# Patient Record
Sex: Female | Born: 1935
Health system: Southern US, Community
[De-identification: ages and names within clinical notes are randomized; demographics above are authoritative.]

## PROBLEM LIST (undated history)

## (undated) DIAGNOSIS — L0390 Cellulitis, unspecified: Secondary | ICD-10-CM

## (undated) HISTORY — PX: GANGLION CYST EXCISION: SHX1691

## (undated) HISTORY — PX: ABDOMINAL HYSTERECTOMY: SHX81

---

## 2004-11-03 ENCOUNTER — Ambulatory Visit: Payer: Self-pay

## 2004-11-10 ENCOUNTER — Ambulatory Visit: Payer: Self-pay

## 2010-11-19 ENCOUNTER — Inpatient Hospital Stay: Payer: Self-pay | Admitting: Internal Medicine

## 2010-11-24 ENCOUNTER — Encounter: Payer: Self-pay | Admitting: Internal Medicine

## 2010-12-08 ENCOUNTER — Encounter: Payer: Self-pay | Admitting: Nurse Practitioner

## 2010-12-08 ENCOUNTER — Encounter: Payer: Self-pay | Admitting: Cardiothoracic Surgery

## 2010-12-11 ENCOUNTER — Encounter: Payer: Self-pay | Admitting: Internal Medicine

## 2010-12-11 ENCOUNTER — Encounter: Payer: Self-pay | Admitting: Nurse Practitioner

## 2010-12-11 ENCOUNTER — Encounter: Payer: Self-pay | Admitting: Cardiothoracic Surgery

## 2014-04-17 DIAGNOSIS — D508 Other iron deficiency anemias: Secondary | ICD-10-CM | POA: Diagnosis not present

## 2014-04-17 DIAGNOSIS — I10 Essential (primary) hypertension: Secondary | ICD-10-CM | POA: Diagnosis not present

## 2014-04-17 DIAGNOSIS — E559 Vitamin D deficiency, unspecified: Secondary | ICD-10-CM | POA: Diagnosis not present

## 2014-10-30 DIAGNOSIS — Z0000 Encounter for general adult medical examination without abnormal findings: Secondary | ICD-10-CM | POA: Diagnosis not present

## 2014-11-04 DIAGNOSIS — Z0000 Encounter for general adult medical examination without abnormal findings: Secondary | ICD-10-CM | POA: Diagnosis not present

## 2015-04-30 DIAGNOSIS — I10 Essential (primary) hypertension: Secondary | ICD-10-CM | POA: Diagnosis not present

## 2015-10-23 DIAGNOSIS — I878 Other specified disorders of veins: Secondary | ICD-10-CM | POA: Diagnosis not present

## 2015-10-23 DIAGNOSIS — D696 Thrombocytopenia, unspecified: Secondary | ICD-10-CM | POA: Diagnosis not present

## 2015-10-23 DIAGNOSIS — R5383 Other fatigue: Secondary | ICD-10-CM | POA: Diagnosis not present

## 2015-10-23 DIAGNOSIS — L539 Erythematous condition, unspecified: Secondary | ICD-10-CM | POA: Diagnosis not present

## 2015-10-23 DIAGNOSIS — R350 Frequency of micturition: Secondary | ICD-10-CM | POA: Diagnosis not present

## 2015-10-23 DIAGNOSIS — R3915 Urgency of urination: Secondary | ICD-10-CM | POA: Diagnosis not present

## 2015-10-23 DIAGNOSIS — L03116 Cellulitis of left lower limb: Secondary | ICD-10-CM | POA: Diagnosis not present

## 2015-10-23 DIAGNOSIS — I10 Essential (primary) hypertension: Secondary | ICD-10-CM | POA: Diagnosis not present

## 2015-10-23 DIAGNOSIS — N390 Urinary tract infection, site not specified: Secondary | ICD-10-CM | POA: Diagnosis not present

## 2015-10-23 DIAGNOSIS — R600 Localized edema: Secondary | ICD-10-CM | POA: Diagnosis not present

## 2015-10-30 DIAGNOSIS — I10 Essential (primary) hypertension: Secondary | ICD-10-CM | POA: Diagnosis not present

## 2015-10-30 DIAGNOSIS — L03116 Cellulitis of left lower limb: Secondary | ICD-10-CM | POA: Diagnosis not present

## 2015-10-30 DIAGNOSIS — I878 Other specified disorders of veins: Secondary | ICD-10-CM | POA: Diagnosis not present

## 2015-10-30 DIAGNOSIS — H259 Unspecified age-related cataract: Secondary | ICD-10-CM | POA: Diagnosis not present

## 2015-10-30 DIAGNOSIS — M1991 Primary osteoarthritis, unspecified site: Secondary | ICD-10-CM | POA: Diagnosis not present

## 2015-10-31 DIAGNOSIS — M1991 Primary osteoarthritis, unspecified site: Secondary | ICD-10-CM | POA: Diagnosis not present

## 2015-10-31 DIAGNOSIS — L03116 Cellulitis of left lower limb: Secondary | ICD-10-CM | POA: Diagnosis not present

## 2015-10-31 DIAGNOSIS — H259 Unspecified age-related cataract: Secondary | ICD-10-CM | POA: Diagnosis not present

## 2015-10-31 DIAGNOSIS — I878 Other specified disorders of veins: Secondary | ICD-10-CM | POA: Diagnosis not present

## 2015-10-31 DIAGNOSIS — I10 Essential (primary) hypertension: Secondary | ICD-10-CM | POA: Diagnosis not present

## 2015-11-02 DIAGNOSIS — L03116 Cellulitis of left lower limb: Secondary | ICD-10-CM | POA: Diagnosis not present

## 2015-11-02 DIAGNOSIS — I878 Other specified disorders of veins: Secondary | ICD-10-CM | POA: Diagnosis not present

## 2015-11-02 DIAGNOSIS — I10 Essential (primary) hypertension: Secondary | ICD-10-CM | POA: Diagnosis not present

## 2015-11-03 DIAGNOSIS — L03116 Cellulitis of left lower limb: Secondary | ICD-10-CM | POA: Diagnosis not present

## 2015-11-03 DIAGNOSIS — H259 Unspecified age-related cataract: Secondary | ICD-10-CM | POA: Diagnosis not present

## 2015-11-03 DIAGNOSIS — M1991 Primary osteoarthritis, unspecified site: Secondary | ICD-10-CM | POA: Diagnosis not present

## 2015-11-03 DIAGNOSIS — I878 Other specified disorders of veins: Secondary | ICD-10-CM | POA: Diagnosis not present

## 2015-11-03 DIAGNOSIS — I10 Essential (primary) hypertension: Secondary | ICD-10-CM | POA: Diagnosis not present

## 2015-11-04 DIAGNOSIS — H259 Unspecified age-related cataract: Secondary | ICD-10-CM | POA: Diagnosis not present

## 2015-11-04 DIAGNOSIS — I10 Essential (primary) hypertension: Secondary | ICD-10-CM | POA: Diagnosis not present

## 2015-11-04 DIAGNOSIS — L03116 Cellulitis of left lower limb: Secondary | ICD-10-CM | POA: Diagnosis not present

## 2015-11-04 DIAGNOSIS — M1991 Primary osteoarthritis, unspecified site: Secondary | ICD-10-CM | POA: Diagnosis not present

## 2015-11-04 DIAGNOSIS — I878 Other specified disorders of veins: Secondary | ICD-10-CM | POA: Diagnosis not present

## 2015-11-06 DIAGNOSIS — I10 Essential (primary) hypertension: Secondary | ICD-10-CM | POA: Diagnosis not present

## 2015-11-06 DIAGNOSIS — M1991 Primary osteoarthritis, unspecified site: Secondary | ICD-10-CM | POA: Diagnosis not present

## 2015-11-06 DIAGNOSIS — I878 Other specified disorders of veins: Secondary | ICD-10-CM | POA: Diagnosis not present

## 2015-11-06 DIAGNOSIS — H259 Unspecified age-related cataract: Secondary | ICD-10-CM | POA: Diagnosis not present

## 2015-11-06 DIAGNOSIS — L03116 Cellulitis of left lower limb: Secondary | ICD-10-CM | POA: Diagnosis not present

## 2015-11-09 DIAGNOSIS — I10 Essential (primary) hypertension: Secondary | ICD-10-CM | POA: Diagnosis not present

## 2015-11-09 DIAGNOSIS — H259 Unspecified age-related cataract: Secondary | ICD-10-CM | POA: Diagnosis not present

## 2015-11-09 DIAGNOSIS — L03116 Cellulitis of left lower limb: Secondary | ICD-10-CM | POA: Diagnosis not present

## 2015-11-09 DIAGNOSIS — I878 Other specified disorders of veins: Secondary | ICD-10-CM | POA: Diagnosis not present

## 2015-11-09 DIAGNOSIS — M1991 Primary osteoarthritis, unspecified site: Secondary | ICD-10-CM | POA: Diagnosis not present

## 2015-11-11 DIAGNOSIS — H259 Unspecified age-related cataract: Secondary | ICD-10-CM | POA: Diagnosis not present

## 2015-11-11 DIAGNOSIS — L03116 Cellulitis of left lower limb: Secondary | ICD-10-CM | POA: Diagnosis not present

## 2015-11-11 DIAGNOSIS — M1991 Primary osteoarthritis, unspecified site: Secondary | ICD-10-CM | POA: Diagnosis not present

## 2015-11-11 DIAGNOSIS — I10 Essential (primary) hypertension: Secondary | ICD-10-CM | POA: Diagnosis not present

## 2015-11-11 DIAGNOSIS — E669 Obesity, unspecified: Secondary | ICD-10-CM | POA: Diagnosis not present

## 2015-11-11 DIAGNOSIS — I878 Other specified disorders of veins: Secondary | ICD-10-CM | POA: Diagnosis not present

## 2015-11-11 DIAGNOSIS — Z6841 Body Mass Index (BMI) 40.0 and over, adult: Secondary | ICD-10-CM | POA: Diagnosis not present

## 2015-11-13 DIAGNOSIS — H259 Unspecified age-related cataract: Secondary | ICD-10-CM | POA: Diagnosis not present

## 2015-11-13 DIAGNOSIS — I10 Essential (primary) hypertension: Secondary | ICD-10-CM | POA: Diagnosis not present

## 2015-11-13 DIAGNOSIS — M1991 Primary osteoarthritis, unspecified site: Secondary | ICD-10-CM | POA: Diagnosis not present

## 2015-11-13 DIAGNOSIS — I878 Other specified disorders of veins: Secondary | ICD-10-CM | POA: Diagnosis not present

## 2015-11-13 DIAGNOSIS — L03116 Cellulitis of left lower limb: Secondary | ICD-10-CM | POA: Diagnosis not present

## 2015-11-16 DIAGNOSIS — L03116 Cellulitis of left lower limb: Secondary | ICD-10-CM | POA: Diagnosis not present

## 2015-11-16 DIAGNOSIS — I878 Other specified disorders of veins: Secondary | ICD-10-CM | POA: Diagnosis not present

## 2015-11-16 DIAGNOSIS — H259 Unspecified age-related cataract: Secondary | ICD-10-CM | POA: Diagnosis not present

## 2015-11-16 DIAGNOSIS — M1991 Primary osteoarthritis, unspecified site: Secondary | ICD-10-CM | POA: Diagnosis not present

## 2015-11-16 DIAGNOSIS — I10 Essential (primary) hypertension: Secondary | ICD-10-CM | POA: Diagnosis not present

## 2015-11-17 DIAGNOSIS — I10 Essential (primary) hypertension: Secondary | ICD-10-CM | POA: Diagnosis not present

## 2015-11-17 DIAGNOSIS — L608 Other nail disorders: Secondary | ICD-10-CM | POA: Diagnosis not present

## 2015-11-17 DIAGNOSIS — L03116 Cellulitis of left lower limb: Secondary | ICD-10-CM | POA: Diagnosis not present

## 2015-11-17 DIAGNOSIS — M1991 Primary osteoarthritis, unspecified site: Secondary | ICD-10-CM | POA: Diagnosis not present

## 2015-11-17 DIAGNOSIS — H259 Unspecified age-related cataract: Secondary | ICD-10-CM | POA: Diagnosis not present

## 2015-11-17 DIAGNOSIS — I878 Other specified disorders of veins: Secondary | ICD-10-CM | POA: Diagnosis not present

## 2015-11-19 DIAGNOSIS — I10 Essential (primary) hypertension: Secondary | ICD-10-CM | POA: Diagnosis not present

## 2015-11-19 DIAGNOSIS — I878 Other specified disorders of veins: Secondary | ICD-10-CM | POA: Diagnosis not present

## 2015-11-19 DIAGNOSIS — L03116 Cellulitis of left lower limb: Secondary | ICD-10-CM | POA: Diagnosis not present

## 2015-11-19 DIAGNOSIS — H259 Unspecified age-related cataract: Secondary | ICD-10-CM | POA: Diagnosis not present

## 2015-11-19 DIAGNOSIS — M1991 Primary osteoarthritis, unspecified site: Secondary | ICD-10-CM | POA: Diagnosis not present

## 2015-11-23 DIAGNOSIS — L03116 Cellulitis of left lower limb: Secondary | ICD-10-CM | POA: Diagnosis not present

## 2015-11-23 DIAGNOSIS — M1991 Primary osteoarthritis, unspecified site: Secondary | ICD-10-CM | POA: Diagnosis not present

## 2015-11-23 DIAGNOSIS — H259 Unspecified age-related cataract: Secondary | ICD-10-CM | POA: Diagnosis not present

## 2015-11-23 DIAGNOSIS — I10 Essential (primary) hypertension: Secondary | ICD-10-CM | POA: Diagnosis not present

## 2015-11-23 DIAGNOSIS — I878 Other specified disorders of veins: Secondary | ICD-10-CM | POA: Diagnosis not present

## 2015-11-25 DIAGNOSIS — I10 Essential (primary) hypertension: Secondary | ICD-10-CM | POA: Diagnosis not present

## 2015-11-25 DIAGNOSIS — R609 Edema, unspecified: Secondary | ICD-10-CM | POA: Diagnosis not present

## 2015-11-25 DIAGNOSIS — L03119 Cellulitis of unspecified part of limb: Secondary | ICD-10-CM | POA: Diagnosis not present

## 2015-11-25 DIAGNOSIS — Z7689 Persons encountering health services in other specified circumstances: Secondary | ICD-10-CM | POA: Diagnosis not present

## 2015-11-30 DIAGNOSIS — H259 Unspecified age-related cataract: Secondary | ICD-10-CM | POA: Diagnosis not present

## 2015-11-30 DIAGNOSIS — I878 Other specified disorders of veins: Secondary | ICD-10-CM | POA: Diagnosis not present

## 2015-11-30 DIAGNOSIS — M1991 Primary osteoarthritis, unspecified site: Secondary | ICD-10-CM | POA: Diagnosis not present

## 2015-11-30 DIAGNOSIS — L03116 Cellulitis of left lower limb: Secondary | ICD-10-CM | POA: Diagnosis not present

## 2015-11-30 DIAGNOSIS — I10 Essential (primary) hypertension: Secondary | ICD-10-CM | POA: Diagnosis not present

## 2016-06-29 DIAGNOSIS — Z0000 Encounter for general adult medical examination without abnormal findings: Secondary | ICD-10-CM | POA: Diagnosis not present

## 2016-06-29 DIAGNOSIS — I10 Essential (primary) hypertension: Secondary | ICD-10-CM | POA: Diagnosis not present

## 2016-09-27 DIAGNOSIS — I890 Lymphedema, not elsewhere classified: Secondary | ICD-10-CM | POA: Diagnosis not present

## 2016-09-27 DIAGNOSIS — M7989 Other specified soft tissue disorders: Secondary | ICD-10-CM | POA: Diagnosis not present

## 2016-09-27 DIAGNOSIS — L03116 Cellulitis of left lower limb: Secondary | ICD-10-CM | POA: Diagnosis not present

## 2016-09-27 DIAGNOSIS — R600 Localized edema: Secondary | ICD-10-CM | POA: Diagnosis not present

## 2016-09-28 DIAGNOSIS — L03116 Cellulitis of left lower limb: Secondary | ICD-10-CM | POA: Diagnosis not present

## 2016-09-28 DIAGNOSIS — I878 Other specified disorders of veins: Secondary | ICD-10-CM | POA: Diagnosis not present

## 2016-09-28 DIAGNOSIS — I890 Lymphedema, not elsewhere classified: Secondary | ICD-10-CM | POA: Diagnosis not present

## 2016-10-05 DIAGNOSIS — I890 Lymphedema, not elsewhere classified: Secondary | ICD-10-CM | POA: Diagnosis not present

## 2016-10-05 DIAGNOSIS — L03116 Cellulitis of left lower limb: Secondary | ICD-10-CM | POA: Diagnosis not present

## 2016-10-05 DIAGNOSIS — L03115 Cellulitis of right lower limb: Secondary | ICD-10-CM | POA: Diagnosis not present

## 2016-10-05 DIAGNOSIS — L02419 Cutaneous abscess of limb, unspecified: Secondary | ICD-10-CM | POA: Diagnosis not present

## 2016-10-09 DIAGNOSIS — L309 Dermatitis, unspecified: Secondary | ICD-10-CM | POA: Diagnosis not present

## 2016-10-09 DIAGNOSIS — I878 Other specified disorders of veins: Secondary | ICD-10-CM | POA: Diagnosis not present

## 2016-10-09 DIAGNOSIS — I872 Venous insufficiency (chronic) (peripheral): Secondary | ICD-10-CM | POA: Diagnosis not present

## 2016-10-09 DIAGNOSIS — Z6835 Body mass index (BMI) 35.0-35.9, adult: Secondary | ICD-10-CM | POA: Diagnosis not present

## 2016-10-09 DIAGNOSIS — L03116 Cellulitis of left lower limb: Secondary | ICD-10-CM | POA: Diagnosis not present

## 2016-10-09 DIAGNOSIS — B353 Tinea pedis: Secondary | ICD-10-CM | POA: Diagnosis not present

## 2016-10-09 DIAGNOSIS — I10 Essential (primary) hypertension: Secondary | ICD-10-CM | POA: Diagnosis not present

## 2016-10-09 DIAGNOSIS — I890 Lymphedema, not elsewhere classified: Secondary | ICD-10-CM | POA: Diagnosis not present

## 2016-10-19 DIAGNOSIS — L02419 Cutaneous abscess of limb, unspecified: Secondary | ICD-10-CM | POA: Diagnosis not present

## 2016-10-19 DIAGNOSIS — I890 Lymphedema, not elsewhere classified: Secondary | ICD-10-CM | POA: Diagnosis not present

## 2016-10-19 DIAGNOSIS — I10 Essential (primary) hypertension: Secondary | ICD-10-CM | POA: Diagnosis not present

## 2016-10-19 DIAGNOSIS — L03119 Cellulitis of unspecified part of limb: Secondary | ICD-10-CM | POA: Diagnosis not present

## 2016-10-25 ENCOUNTER — Encounter (INDEPENDENT_AMBULATORY_CARE_PROVIDER_SITE_OTHER): Payer: Self-pay | Admitting: Vascular Surgery

## 2016-10-28 DIAGNOSIS — L0390 Cellulitis, unspecified: Secondary | ICD-10-CM

## 2016-10-28 HISTORY — DX: Cellulitis, unspecified: L03.90

## 2016-11-04 ENCOUNTER — Emergency Department: Payer: Medicare HMO

## 2016-11-04 ENCOUNTER — Inpatient Hospital Stay
Admission: EM | Admit: 2016-11-04 | Discharge: 2016-11-07 | DRG: 682 | Disposition: A | Discharge status: Skilled Nursing Facility | Payer: Medicare HMO | Attending: Internal Medicine | Admitting: Internal Medicine

## 2016-11-04 ENCOUNTER — Encounter: Payer: Self-pay | Admitting: *Deleted

## 2016-11-04 DIAGNOSIS — L03116 Cellulitis of left lower limb: Secondary | ICD-10-CM | POA: Diagnosis not present

## 2016-11-04 DIAGNOSIS — R740 Nonspecific elevation of levels of transaminase and lactic acid dehydrogenase [LDH]: Secondary | ICD-10-CM | POA: Diagnosis present

## 2016-11-04 DIAGNOSIS — L8993 Pressure ulcer of unspecified site, stage 3: Secondary | ICD-10-CM | POA: Diagnosis present

## 2016-11-04 DIAGNOSIS — N179 Acute kidney failure, unspecified: Secondary | ICD-10-CM | POA: Diagnosis not present

## 2016-11-04 DIAGNOSIS — I890 Lymphedema, not elsewhere classified: Secondary | ICD-10-CM | POA: Diagnosis not present

## 2016-11-04 DIAGNOSIS — R4182 Altered mental status, unspecified: Secondary | ICD-10-CM | POA: Diagnosis not present

## 2016-11-04 DIAGNOSIS — R748 Abnormal levels of other serum enzymes: Secondary | ICD-10-CM | POA: Diagnosis not present

## 2016-11-04 DIAGNOSIS — L03115 Cellulitis of right lower limb: Secondary | ICD-10-CM | POA: Diagnosis not present

## 2016-11-04 DIAGNOSIS — Z7982 Long term (current) use of aspirin: Secondary | ICD-10-CM

## 2016-11-04 DIAGNOSIS — L89322 Pressure ulcer of left buttock, stage 2: Secondary | ICD-10-CM | POA: Insufficient documentation

## 2016-11-04 DIAGNOSIS — Z8249 Family history of ischemic heart disease and other diseases of the circulatory system: Secondary | ICD-10-CM

## 2016-11-04 DIAGNOSIS — Z90710 Acquired absence of both cervix and uterus: Secondary | ICD-10-CM

## 2016-11-04 DIAGNOSIS — L89152 Pressure ulcer of sacral region, stage 2: Secondary | ICD-10-CM | POA: Diagnosis not present

## 2016-11-04 DIAGNOSIS — E860 Dehydration: Secondary | ICD-10-CM | POA: Diagnosis not present

## 2016-11-04 DIAGNOSIS — I509 Heart failure, unspecified: Secondary | ICD-10-CM | POA: Diagnosis not present

## 2016-11-04 DIAGNOSIS — Z743 Need for continuous supervision: Secondary | ICD-10-CM | POA: Diagnosis not present

## 2016-11-04 DIAGNOSIS — R531 Weakness: Secondary | ICD-10-CM | POA: Diagnosis not present

## 2016-11-04 DIAGNOSIS — Z833 Family history of diabetes mellitus: Secondary | ICD-10-CM | POA: Diagnosis not present

## 2016-11-04 DIAGNOSIS — I878 Other specified disorders of veins: Secondary | ICD-10-CM | POA: Diagnosis present

## 2016-11-04 DIAGNOSIS — D649 Anemia, unspecified: Secondary | ICD-10-CM | POA: Diagnosis not present

## 2016-11-04 DIAGNOSIS — R7989 Other specified abnormal findings of blood chemistry: Secondary | ICD-10-CM | POA: Diagnosis not present

## 2016-11-04 DIAGNOSIS — L8990 Pressure ulcer of unspecified site, unspecified stage: Secondary | ICD-10-CM | POA: Insufficient documentation

## 2016-11-04 DIAGNOSIS — M6281 Muscle weakness (generalized): Secondary | ICD-10-CM | POA: Diagnosis not present

## 2016-11-04 DIAGNOSIS — R778 Other specified abnormalities of plasma proteins: Secondary | ICD-10-CM | POA: Diagnosis present

## 2016-11-04 HISTORY — DX: Cellulitis, unspecified: L03.90

## 2016-11-04 LAB — COMPREHENSIVE METABOLIC PANEL
ALT: 58 U/L — AB (ref 14–54)
ANION GAP: 10 (ref 5–15)
AST: 165 U/L — ABNORMAL HIGH (ref 15–41)
Albumin: 3.4 g/dL — ABNORMAL LOW (ref 3.5–5.0)
Alkaline Phosphatase: 54 U/L (ref 38–126)
BUN: 35 mg/dL — ABNORMAL HIGH (ref 6–20)
CHLORIDE: 104 mmol/L (ref 101–111)
CO2: 24 mmol/L (ref 22–32)
Calcium: 8.8 mg/dL — ABNORMAL LOW (ref 8.9–10.3)
Creatinine, Ser: 1.19 mg/dL — ABNORMAL HIGH (ref 0.44–1.00)
GFR calc Af Amer: 46 mL/min — ABNORMAL LOW (ref >60)
GFR, EST NON AFRICAN AMERICAN: 40 mL/min — AB (ref >60)
Glucose, Bld: 134 mg/dL — ABNORMAL HIGH (ref 65–99)
POTASSIUM: 3.7 mmol/L (ref 3.5–5.1)
SODIUM: 138 mmol/L (ref 135–145)
Total Bilirubin: 1.4 mg/dL — ABNORMAL HIGH (ref 0.3–1.2)
Total Protein: 6.9 g/dL (ref 6.5–8.1)

## 2016-11-04 LAB — CBC WITH DIFFERENTIAL/PLATELET
BASOS ABS: 0 10*3/uL (ref 0–0.1)
Basophils Relative: 0 %
Eosinophils Absolute: 0 10*3/uL (ref 0–0.7)
Eosinophils Relative: 0 %
HCT: 31.2 % — ABNORMAL LOW (ref 35.0–47.0)
HEMOGLOBIN: 10.2 g/dL — AB (ref 12.0–16.0)
LYMPHS ABS: 0.9 10*3/uL — AB (ref 1.0–3.6)
Lymphocytes Relative: 12 %
MCH: 30.4 pg (ref 26.0–34.0)
MCHC: 32.6 g/dL (ref 32.0–36.0)
MCV: 93.1 fL (ref 80.0–100.0)
Monocytes Absolute: 0.6 10*3/uL (ref 0.2–0.9)
Monocytes Relative: 7 %
NEUTROS ABS: 6.1 10*3/uL (ref 1.4–6.5)
NEUTROS PCT: 81 %
PLATELETS: 153 10*3/uL (ref 150–440)
RBC: 3.35 MIL/uL — AB (ref 3.80–5.20)
RDW: 13.6 % (ref 11.5–14.5)
WBC: 7.6 10*3/uL (ref 3.6–11.0)

## 2016-11-04 LAB — URINALYSIS, COMPLETE (UACMP) WITH MICROSCOPIC
Bilirubin Urine: NEGATIVE
Glucose, UA: NEGATIVE mg/dL
Ketones, ur: NEGATIVE mg/dL
LEUKOCYTES UA: NEGATIVE
NITRITE: NEGATIVE
PROTEIN: 30 mg/dL — AB
SPECIFIC GRAVITY, URINE: 1.021 (ref 1.005–1.030)
pH: 5 (ref 5.0–8.0)

## 2016-11-04 LAB — TROPONIN I: TROPONIN I: 0.06 ng/mL — AB (ref <0.03)

## 2016-11-04 LAB — BRAIN NATRIURETIC PEPTIDE: B NATRIURETIC PEPTIDE 5: 303 pg/mL — AB (ref 0.0–100.0)

## 2016-11-04 NOTE — ED Triage Notes (Signed)
Pt to ED reporting increased weakness since last night. Pt normally able to ambulate around house with cane but today can not stand. PT was treated last week for bilateral lower ext. Cellulitis. PT was cleared by PCP this week. Pt smells of urine upon arrival but reports she has not been able to stand to go to the bathroom so she has been urinating on self today. EMS reported temp of 99.9. Temp in ED is 98.0. Pt is alert and oriented x 4.

## 2016-11-04 NOTE — ED Provider Notes (Signed)
[facility] Emergency Department Provider Note   ____________________________________________   First MD Initiated Contact with Patient 11/04/16 1846     (approximate)  I have reviewed the triage vital signs and the nursing notes.   HISTORY  Chief Complaint Weakness and Fall    HPI [name] [name] is a 83 y.o. female Patient reports as noted in nurse's note that she has been getting weak since last night and today she cannot even get up out of the chair. Normally she walks around the house because her housework.she does not have any chest pain and really not having shortness of breath or anything else. She had a temperature of 99.9. She was treated for cellulitis last week and her legs are still somewhat red and swollen.   Past Medical History:  Diagnosis Date  . Cellulitis 10/28/2016   bilateral lower extremities.     There are no active problems to display for this patient.   History reviewed. No pertinent surgical history.  Prior to Admission medications   Not on File    Allergies Patient has no allergy information on record.  History reviewed. No pertinent family history.  Social History Social History  Substance Use Topics  . Smoking status: Never Smoker  . Smokeless tobacco: Never Used  . Alcohol use No    Review of Systems  Constitutional: see history of present illness Eyes: No visual changes. ENT: No sore throat. Cardiovascular: Denies chest pain. Respiratory: Denies shortness of breath. Gastrointestinal: No abdominal pain.  No nausea, no vomiting.  No diarrhea.  No constipation. Genitourinary: Negative for dysuria. Musculoskeletal: Negative for back pain. Skin: Negative for rash. Neurological: Negative for headaches, focal weakness   ____________________________________________   PHYSICAL EXAM:  VITAL SIGNS: ED Triage Vitals  Enc Vitals Group     BP 11/04/16 1833 (!) 139/48     Pulse Rate 11/04/16 1833 89   Resp 11/04/16 1833 (!) 27     Temp 11/04/16 1833 98 F (36.7 C)     Temp Source 11/04/16 1833 Oral     SpO2 11/04/16 1833 99 %     Weight 11/04/16 1833 226 lb (102.5 kg)     Height 11/04/16 1833 5\' 10"  (1.778 m)     Head Circumference --      Peak Flow --      Pain Score 11/04/16 1824 0     Pain Loc --      Pain Edu? --      Excl. in GC? --     Constitutional: Alert and oriented. Well appearing and in no acute distress. Eyes: Conjunctivae are normal.  Head: Atraumatic. Nose: No congestion/rhinnorhea. Mouth/Throat: Mucous membranes are moist.  Oropharynx non-erythematous. Neck: No stridor Cardiovascular: Normal rate, regular rhythm. Grossly normal heart sounds.  Good peripheral circulation. Respiratory: Normal respiratory effort.  No retractions. Lungs CTAB. Gastrointestinal: Soft and nontender. No distention. No abdominal bruits. No CVA tenderness. }Musculoskeletal: No lower extremity tendernessbilateral edema edema.  No joint effusions. Neurologic:  Normal speech and language. motor strength in arms is equal bilaterally however the patient is unable to lift her right leg up off the bed she can lift the left leg up off the bed. Strength in the knees and toes is normal and equal bilaterally Skin:  Skin is warm, dry and intact. Nresolving cellulitis on both legs Psychiatric: Mood and affect are normal. Speech and behavior are normal.  ____________________________________________   LABS (all labs ordered are listed, but only abnormal results are  displayed)  Labs Reviewed  COMPREHENSIVE METABOLIC PANEL - Abnormal; Notable for the following:       Result Value   Glucose, Bld 134 (*)    BUN 35 (*)    Creatinine, Ser 1.19 (*)    Calcium 8.8 (*)    Albumin 3.4 (*)    AST 165 (*)    ALT 58 (*)    Total Bilirubin 1.4 (*)    GFR calc non Af Amer 40 (*)    GFR calc Af Amer 46 (*)    All other components within normal limits  TROPONIN I - Abnormal; Notable for the following:     Troponin I 0.06 (*)    All other components within normal limits  CBC WITH DIFFERENTIAL/PLATELET - Abnormal; Notable for the following:    RBC 3.35 (*)    Hemoglobin 10.2 (*)    HCT 31.2 (*)    Lymphs Abs 0.9 (*)    All other components within normal limits  URINALYSIS, COMPLETE (UACMP) WITH MICROSCOPIC - Abnormal; Notable for the following:    Color, Urine YELLOW (*)    APPearance CLEAR (*)    Hgb urine dipstick MODERATE (*)    Protein, ur 30 (*)    Bacteria, UA RARE (*)    Squamous Epithelial / LPF 0-5 (*)    All other components within normal limits  BRAIN NATRIURETIC PEPTIDE - Abnormal; Notable for the following:    B Natriuretic Peptide 303.0 (*)    All other components within normal limits   ____________________________________________  EKG   ____________________________________________  RADIOLOGY  Ct Head Wo Contrast  Result Date: 11/04/2016 CLINICAL DATA:  83 year old with increasing weakness since last night. Suspected stroke. EXAM: CT HEAD WITHOUT CONTRAST TECHNIQUE: Contiguous axial images were obtained from the base of the skull through the vertex without intravenous contrast. COMPARISON:  None. FINDINGS: Brain: There is no evidence of acute intracranial hemorrhage, mass lesion, brain edema or extra-axial fluid collection. There is prominence of the ventricles and subarachnoid spaces consistent with mild atrophy. There is no CT evidence of acute cortical infarction. There are mild chronic small vessel ischemic changes in the periventricular white matter. Vascular: Intracranial vascular calcifications are present. No hyperdense vessel identified. Skull: Negative for fracture or focal lesion. Sinuses/Orbits: The visualized paranasal sinuses and mastoid air cells are clear. No orbital abnormalities are seen. Other: None. IMPRESSION: 1. No evidence of acute/subacute stroke or other acute intracranial findings. 2. Mild atrophy and chronic periventricular white matter disease.  Electronically Signed   By: Carey Bullocks M.D.   On: 11/04/2016 19:45   Dg Chest Portable 1 View  Result Date: 11/04/2016 CLINICAL DATA:  Increased weakness and bilateral lower extremity cellulitis. EXAM: PORTABLE CHEST 1 VIEW COMPARISON:  None. FINDINGS: Lungs are adequately inflated with subtle prominence of the perihilar markings which may be due to mild vascular congestion. There is no lobar consolidation or effusion. Cardiomediastinal silhouette is within normal. There is calcified plaque over the aortic arch. There are degenerative changes of the spine. IMPRESSION: Possible mild vascular congestion. Electronically Signed   By: Elberta Fortis M.D.   On: 11/04/2016 18:48   chest x-ray may show some congestive failure. This would go along with the edema in her legs. ____________________________________________   PROCEDURES  Procedure(s) performed:   Procedures  Critical Care performed:  ____________________________________________   INITIAL IMPRESSION / ASSESSMENT AND PLAN / ED COURSE  As part of my medical decision making, I reviewed the following data within the electronic  MEDICAL RECORD NUMBER {    review of patient's old records show that her GFR has gone from 72 down to the 40s. In addition her troponin is elevated mildly and she has elevated BNP as well. Her legs are very edematous and she is too weak to get out of the chair. CT does not show any sign of a stroke. His possible that she may have had a heart attack recently and is now suffering sequela of that. Her increasing edema and weakness would go along with that.has a very least she has some acute kidney injury congestive failure and elevated troponin   ____________________________________________   FINAL CLINICAL IMPRESSION(S) / ED DIAGNOSES  Final diagnoses:  Weak  Elevated troponin  AKI (acute kidney injury) (HCC)  Congestive heart failure, unspecified HF chronicity, unspecified heart failure type (HCC)      NEW  MEDICATIONS STARTED DURING THIS VISIT:  New Prescriptions   No medications on file     Note:  This document was prepared using Dragon voice recognition software and may include unintentional dictation errors.    Arnaldo NatalMalinda, Paul F, MD 11/04/16 2116

## 2016-11-04 NOTE — ED Notes (Signed)
X-ray at bedside

## 2016-11-04 NOTE — ED Notes (Addendum)
Pt's son, Lavera GuiseJerry Kattner, left his phone number [phone]) and would like to be kept informed of any updates to patient's condition and status.

## 2016-11-05 ENCOUNTER — Encounter: Payer: Self-pay | Admitting: Internal Medicine

## 2016-11-05 DIAGNOSIS — E860 Dehydration: Secondary | ICD-10-CM | POA: Diagnosis present

## 2016-11-05 DIAGNOSIS — R748 Abnormal levels of other serum enzymes: Secondary | ICD-10-CM | POA: Diagnosis present

## 2016-11-05 DIAGNOSIS — Z7982 Long term (current) use of aspirin: Secondary | ICD-10-CM | POA: Diagnosis not present

## 2016-11-05 DIAGNOSIS — Z833 Family history of diabetes mellitus: Secondary | ICD-10-CM | POA: Diagnosis not present

## 2016-11-05 DIAGNOSIS — I878 Other specified disorders of veins: Secondary | ICD-10-CM | POA: Diagnosis present

## 2016-11-05 DIAGNOSIS — N179 Acute kidney failure, unspecified: Secondary | ICD-10-CM | POA: Diagnosis present

## 2016-11-05 DIAGNOSIS — R740 Nonspecific elevation of levels of transaminase and lactic acid dehydrogenase [LDH]: Secondary | ICD-10-CM | POA: Diagnosis present

## 2016-11-05 DIAGNOSIS — I509 Heart failure, unspecified: Secondary | ICD-10-CM | POA: Diagnosis present

## 2016-11-05 DIAGNOSIS — R531 Weakness: Secondary | ICD-10-CM | POA: Diagnosis present

## 2016-11-05 DIAGNOSIS — I890 Lymphedema, not elsewhere classified: Secondary | ICD-10-CM | POA: Diagnosis present

## 2016-11-05 DIAGNOSIS — L03115 Cellulitis of right lower limb: Secondary | ICD-10-CM | POA: Diagnosis present

## 2016-11-05 DIAGNOSIS — L89322 Pressure ulcer of left buttock, stage 2: Secondary | ICD-10-CM | POA: Insufficient documentation

## 2016-11-05 DIAGNOSIS — L8993 Pressure ulcer of unspecified site, stage 3: Secondary | ICD-10-CM | POA: Diagnosis present

## 2016-11-05 DIAGNOSIS — Z8249 Family history of ischemic heart disease and other diseases of the circulatory system: Secondary | ICD-10-CM | POA: Diagnosis not present

## 2016-11-05 DIAGNOSIS — L03116 Cellulitis of left lower limb: Secondary | ICD-10-CM | POA: Diagnosis present

## 2016-11-05 DIAGNOSIS — L8990 Pressure ulcer of unspecified site, unspecified stage: Secondary | ICD-10-CM | POA: Insufficient documentation

## 2016-11-05 DIAGNOSIS — Z90710 Acquired absence of both cervix and uterus: Secondary | ICD-10-CM | POA: Diagnosis not present

## 2016-11-05 LAB — TSH: TSH: 2.305 u[IU]/mL (ref 0.350–4.500)

## 2016-11-05 LAB — TROPONIN I
Troponin I: 0.05 ng/mL (ref <0.03)
Troponin I: 0.06 ng/mL (ref <0.03)
Troponin I: 0.06 ng/mL (ref <0.03)

## 2016-11-05 MED ORDER — FUROSEMIDE 40 MG PO TABS: 20.0000 mg | ORAL_TABLET | Freq: Two times a day (BID) | ORAL | Status: DC | PRN

## 2016-11-05 MED ORDER — ASPIRIN-CAFFEINE 400-32 MG PO TABS: 2.0000 | ORAL_TABLET | Freq: Every day | ORAL | Status: DC

## 2016-11-05 MED ORDER — VITAMIN C 500 MG PO TABS
250.0000 mg | ORAL_TABLET | Freq: Every day | ORAL | Status: DC
  Administered 2016-11-05 – 2016-11-07 (×3): 250 mg via ORAL
  Filled 2016-11-05 (×3): qty 0.5

## 2016-11-05 MED ORDER — ONDANSETRON HCL 4 MG/2ML IJ SOLN: 4.0000 mg | Freq: Four times a day (QID) | INTRAMUSCULAR | Status: DC | PRN

## 2016-11-05 MED ORDER — MORPHINE SULFATE (PF) 2 MG/ML IV SOLN
1.0000 mg | INTRAVENOUS | Status: DC | PRN
Start: 2016-11-05 — End: 2016-11-05

## 2016-11-05 MED ORDER — ASPIRIN-ACETAMINOPHEN-CAFFEINE 250-250-65 MG PO TABS
2.0000 | ORAL_TABLET | Freq: Every day | ORAL | Status: DC
  Administered 2016-11-06: 2 via ORAL
  Filled 2016-11-05 (×3): qty 2

## 2016-11-05 MED ORDER — HEPARIN SODIUM (PORCINE) 5000 UNIT/ML IJ SOLN
5000.0000 [IU] | Freq: Three times a day (TID) | INTRAMUSCULAR | Status: DC
  Administered 2016-11-05 – 2016-11-07 (×8): 5000 [IU] via SUBCUTANEOUS
  Filled 2016-11-05 (×8): qty 1

## 2016-11-05 MED ORDER — DOCUSATE SODIUM 100 MG PO CAPS
100.0000 mg | ORAL_CAPSULE | Freq: Two times a day (BID) | ORAL | Status: DC
  Administered 2016-11-05 – 2016-11-07 (×6): 100 mg via ORAL
  Filled 2016-11-05 (×6): qty 1

## 2016-11-05 MED ORDER — ONDANSETRON HCL 4 MG PO TABS: 4.0000 mg | ORAL_TABLET | Freq: Four times a day (QID) | ORAL | Status: DC | PRN

## 2016-11-05 MED ORDER — COQ10 100 MG PO CAPS: 1.0000 | ORAL_CAPSULE | Freq: Every day | ORAL | Status: DC

## 2016-11-05 MED ORDER — POTASSIUM CHLORIDE ER 10 MEQ PO TBCR
10.0000 meq | EXTENDED_RELEASE_TABLET | Freq: Every day | ORAL | Status: DC
  Administered 2016-11-05 – 2016-11-07 (×3): 10 meq via ORAL
  Filled 2016-11-05 (×5): qty 1

## 2016-11-05 MED ORDER — KETOCONAZOLE 2 % EX CREA
1.0000 "application " | TOPICAL_CREAM | Freq: Every day | CUTANEOUS | Status: DC
  Administered 2016-11-05 – 2016-11-07 (×3): 1 via TOPICAL
  Filled 2016-11-05: qty 15

## 2016-11-05 MED ORDER — ACETAMINOPHEN 325 MG PO TABS: 650.0000 mg | ORAL_TABLET | Freq: Four times a day (QID) | ORAL | Status: DC | PRN

## 2016-11-05 MED ORDER — FERROUS SULFATE 325 (65 FE) MG PO TABS
325.0000 mg | ORAL_TABLET | Freq: Every day | ORAL | Status: DC
  Administered 2016-11-05 – 2016-11-07 (×3): 325 mg via ORAL
  Filled 2016-11-05 (×3): qty 1

## 2016-11-05 MED ORDER — ADULT MULTIVITAMIN W/MINERALS CH
1.0000 | ORAL_TABLET | Freq: Every day | ORAL | Status: DC
  Administered 2016-11-05 – 2016-11-07 (×3): 1 via ORAL
  Filled 2016-11-05 (×3): qty 1

## 2016-11-05 MED ORDER — ACETAMINOPHEN 650 MG RE SUPP: 650.0000 mg | Freq: Four times a day (QID) | RECTAL | Status: DC | PRN

## 2016-11-05 MED ORDER — SODIUM CHLORIDE 0.9 % IV SOLN
Freq: Once | INTRAVENOUS | Status: AC
  Administered 2016-11-05: 09:00:00 via INTRAVENOUS

## 2016-11-05 MED ORDER — TRAMADOL HCL 50 MG PO TABS: 50.0000 mg | ORAL_TABLET | Freq: Four times a day (QID) | ORAL | Status: DC | PRN

## 2016-11-05 NOTE — Progress Notes (Addendum)
[facility] [place] [facility]   PATIENT NAME: [name] PatrickDorothy [name]    MR#:  [id number]  DATE OF BIRTH:  [birth year]  SUBJECTIVE:  Patient came with increasing weakness difficulty ambulating at home.  Denies any chest pain. Recently finished a course of Keflex and doxycycline for her lower extremity cellulitis/redness no fever   REVIEW OF SYSTEMS:   Review of Systems  Constitutional: Negative for chills, fever and weight loss.  HENT: Negative for ear discharge, ear pain and nosebleeds.   Eyes: Negative for blurred vision, pain and discharge.  Respiratory: Negative for sputum production, shortness of breath, wheezing and stridor.   Cardiovascular: Positive for leg swelling. Negative for chest pain, palpitations, orthopnea and PND.  Gastrointestinal: Negative for abdominal pain, diarrhea, nausea and vomiting.  Genitourinary: Negative for frequency and urgency.  Musculoskeletal: Negative for back pain and joint pain.  Skin: Positive for rash.  Neurological: Positive for weakness. Negative for sensory change, speech change and focal weakness.  Psychiatric/Behavioral: Negative for depression and hallucinations. The patient is not nervous/anxious.    Tolerating Diet:yes Tolerating PT: pending  DRUG ALLERGIES:  Not on File  VITALS:  Blood pressure (!) 116/44, pulse 77, temperature 98.4 F (36.9 C), temperature source Oral, resp. rate 18, height 5\' 10"  (1.778 m), weight 103.3 kg (227 lb 11.2 oz), SpO2 100 %.  PHYSICAL EXAMINATION:   Physical Exam  GENERAL:  83 y.o.-year-old patient lying in the bed with no acute distress. obese EYES: Pupils equal, round, reactive to light and accommodation. No scleral icterus. Extraocular muscles intact.  HEENT: Head atraumatic, normocephalic. Oropharynx and nasopharynx clear.  NECK:  Supple, no jugular venous distention. No thyroid enlargement, no tenderness.  LUNGS: Normal breath sounds bilaterally, no wheezing, rales,  rhonchi. No use of accessory muscles of respiration.  CARDIOVASCULAR: S1, S2 normal. No murmurs, rubs, or gallops.  ABDOMEN: Soft, nontender, nondistended. Bowel sounds present. No organomegaly or mass.  EXTREMITIES: Bilateral lower extremity chronic mild edema with chronic hyperpigmentation.  Patient has some lymphedema NEUROLOGIC: Cranial nerves II through XII are intact. No focal Motor or sensory deficits b/l.   PSYCHIATRIC:  patient is alert and oriented x 3.  SKIN: No obvious rash, lesion, or ulcer.   LABORATORY PANEL:  CBC  Recent Labs Lab 11/04/16 1854  WBC 7.6  HGB 10.2*  HCT 31.2*  PLT 153    Chemistries   Recent Labs Lab 11/04/16 1854  NA 138  K 3.7  CL 104  CO2 24  GLUCOSE 134*  BUN 35*  CREATININE 1.19*  CALCIUM 8.8*  AST 165*  ALT 58*  ALKPHOS 54  BILITOT 1.4*   Cardiac Enzymes  Recent Labs Lab 11/05/16 0130  TROPONINI 0.05*   RADIOLOGY:  Ct Head Wo Contrast  Result Date: 11/04/2016 CLINICAL DATA:  83 year old with increasing weakness since last night. Suspected stroke. EXAM: CT HEAD WITHOUT CONTRAST TECHNIQUE: Contiguous axial images were obtained from the base of the skull through the vertex without intravenous contrast. COMPARISON:  None. FINDINGS: Brain: There is no evidence of acute intracranial hemorrhage, mass lesion, brain edema or extra-axial fluid collection. There is prominence of the ventricles and subarachnoid spaces consistent with mild atrophy. There is no CT evidence of acute cortical infarction. There are mild chronic small vessel ischemic changes in the periventricular white matter. Vascular: Intracranial vascular calcifications are present. No hyperdense vessel identified. Skull: Negative for fracture or focal lesion. Sinuses/Orbits: The visualized paranasal sinuses and mastoid air cells are clear. No orbital abnormalities are  seen. Other: None. IMPRESSION: 1. No evidence of acute/subacute stroke or other acute intracranial findings. 2.  Mild atrophy and chronic periventricular white matter disease. Electronically Signed   By: Carey Bullocks M.D.   On: 11/04/2016 19:45   Dg Chest Portable 1 View  Result Date: 11/04/2016 CLINICAL DATA:  Increased weakness and bilateral lower extremity cellulitis. EXAM: PORTABLE CHEST 1 VIEW COMPARISON:  None. FINDINGS: Lungs are adequately inflated with subtle prominence of the perihilar markings which may be due to mild vascular congestion. There is no lobar consolidation or effusion. Cardiomediastinal silhouette is within normal. There is calcified plaque over the aortic arch. There are degenerative changes of the spine. IMPRESSION: Possible mild vascular congestion. Electronically Signed   By: Elberta Fortis M.D.   On: 11/04/2016 18:48   ASSESSMENT AND PLAN:  83 year old female admitted for acute kidney injury. 1. AKI: Review the patient's records shows that she does not have chronic kidney disease. Current kidney injury likely prerenal secondary to poor by mouth intake and/or poor perfusion.  -Baseline creatinine is  0.9 -Patient appears clinically dehydrated we will start her on normal saline at 75 cc an hour  We will avoid nephrotoxic agents. Encourage by mouth intake. -Hold Lasix  2. Elevated troponin:  Could be due to elevated creatinine  -patient denies chest pain. EKG does not indicate ischemia. Continue to monitor telemetry and follow cardiac biomarkers. - Cardiology consult. - Continue aspirin  3. Lymphedema: Chronic; -No evidence of acute cellulitis.  Patient just finished a course of Keflex and doxycycline -Not sure if she needs Unna boot however will await wound care consult  4. Weakness: Generalized. PT/OT eval.   5.  Elevated transaminases, mild -Etiology unclear -We will continue to monitor.  Patient is asymptomatic.  If LFTs do not improve consider ultrasound abdomen  Case discussed with Care Management/Social Worker. Management plans discussed with the patient,  family and they are in agreement.  CODE STATUS: Full  DVT Prophylaxis: Heparin  TOTAL TIME TAKING CARE OF THIS PATIENT: *30* minutes.  >50% time spent on counselling and coordination of care  POSSIBLE D/C IN *1-2 DAYS, DEPENDING ON CLINICAL CONDITION.  Note: This dictation was prepared with Dragon dictation along with smaller phrase technology. Any transcriptional errors that result from this process are unintentional.  Tavian Callander M.D on 11/05/2016 at 7:24 AM  Between 7am to 6pm - Pager - [phone]  After 6pm go to [email or web address] - password [email or web address]  [facility]  Office  [phone]  CC: Primary care physician; System, Pcp Not In

## 2016-11-05 NOTE — Progress Notes (Signed)
Patient is admitted to room 239 with the diagnosis of Elevated Troponin. Alert and oriented x 4. Denied any acute pain at this moment. Tele box called to CCMD . Skin assessment done with Blase MessLisa Romero,See the findings on assessment flow sheet. Safety risk assessed, bed alarm activated and the bed is in the lowest position. Will continue to monitor.

## 2016-11-05 NOTE — Progress Notes (Signed)
Applied new daily wound dressing at this time, as ordered. Patient tolerated well. Jari FavreSteven M [facility]

## 2016-11-05 NOTE — Plan of Care (Signed)
Problem: Skin Integrity: Goal: Skin integrity will improve Outcome: Progressing WOC nurse to see patient today for possible Unna boots. Will continue to monitor. Jari FavreSteven M [facility]

## 2016-11-05 NOTE — Progress Notes (Signed)
Unna boot ordered by Dr. Sheryle Hailiamond for cellulitis on bilateral LE. Unna boot not available in the clean utility room. The supervisor Dewayne Hatch(Ann ) notified and she informed the RN to tell the orderly to look in the supply room. Orderly lady Hydrographic surveyor(Crystal) informed and she indicated she was going to see if she could find one  in the supply room. Crystal left the building to go home without getting back to me. Will pass it on to the incoming RN to get day shift orderly to look for one. Wound consult placed.

## 2016-11-05 NOTE — Progress Notes (Signed)
Bilateral Prevalon boots applied, as ordered. Jari FavreSteven M [facility]

## 2016-11-05 NOTE — Consult Note (Signed)
WOC Nurse wound consult note Reason for Consult: Placement of bilateral Unna's boots, evaluation and recommendations for care of Stage 3 pressure injury to left IT. Wound type: Pressure, Moisture, venous insufficiency Pressure Injury POA: Yes Measurement: Left IT Stage 3 pressure injury (POA) measures 3cm x 2.5cm x 0.1cm with ruddy red center and jagged edges consistent with shear injury in addition to pressure. Maceration in the immediate periwound area secondary to urinary incontinence. Patient now with external powered urinary incontinence management system (PURwick) to assist with managing microclimate. Minimal amount of serous exudate. Bilateral LEs with edema and hemosiderin staining, but no wounds and no exudate. Wound bed:As described above Drainage (amount, consistency, odor) As described above Periwound:As described above Dressing procedure/placement/frequency:Bilateral Unna's Boots are placed today, wrapping from toe to knee. Patient tolerated procedure well.  Bilateral pressure redistribution heel boots are provided for pressure injury prevention and correction of the lateral rotation of the feet. Guidance for topical care of the Stage 3 pressure injury to the left IT are provided via the Orders, using daily application of a calcium alginate dressing topped with a dry gauze and then covered with silicone foam dressing. A pressure redistribution chair cushion is provided for patient use when OOB to chair.   WOC nursing team will follow for weekly changes of the Unna's boots on Fridays while in house, and will remain available to this patient, the nursing and medical teams. [facility] for [facility]na[facility] Boot management if she returns to the home setting or LTC can manage these if she is placed following discharge. Thanks, Ladona MowLaurie Mickelle Goupil, MSN, RN, GNP, Hans EdenCWOCN, CWON-AP, FAAN  Pager# [phone]

## 2016-11-05 NOTE — H&P (Signed)
[name] [name] is an 83 y.o. female.   Chief Complaint: Weakness HPI: The patient with past medical history of recent admission for cellulitis of her lower extremities presents to the emergency department complaining of weakness. The patient states that she has been in pain because it is difficult for her to shift and moving from a seated or supine position to standing. Per nursing the patient also smelled of urine suggesting inability to take care of toileting needs. Laboratory evaluation revealed elevated creatinine, transaminitis and elevated troponin. The patient did not endorse any pain currently but states that she has been unable to sleep well because of generalized pain. Due to her lab abnormalities the emergency department staff called the hospitalist service for admission.  Past Medical History:  Diagnosis Date  . Cellulitis 10/28/2016   bilateral lower extremities.     Past Surgical History:  Procedure Laterality Date  . ABDOMINAL HYSTERECTOMY    . GANGLION CYST EXCISION      Family History  Problem Relation Age of Onset  . Diabetes Mellitus II Mother   . Heart disease Father    Social History:  reports that she has never smoked. She has never used smokeless tobacco. She reports that she does not drink alcohol or use drugs.  Allergies: Not on File  Prior to Admission medications   Medication Sig Start Date End Date Taking? Authorizing Provider  furosemide (LASIX) 20 MG tablet Take 1 tablet by mouth 2 (two) times daily as needed for edema. 09/28/16 09/28/17 Yes [provider]  ketoconazole (NIZORAL) 2 % cream Apply 1 application topically daily. 10/13/16 10/13/17 Yes [provider]  potassium chloride (K-DUR) 10 MEQ tablet Take 1 tablet by mouth daily. 09/28/16 09/28/17 Yes [provider]  ascorbic acid (VITAMIN C) 100 MG tablet Take 1 tablet by mouth daily.    [provider]  Aspirin-Caffeine 400-32 MG TABS Take 2 tablets by mouth daily.     [provider]  Coenzyme Q10 (COQ10) 100 MG CAPS Take 1 capsule by mouth daily.    [provider]  ERGOCALCIFEROL PO Take 1 capsule by mouth daily.    [provider]  ferrous sulfate 325 (65 FE) MG tablet Take 1 tablet by mouth daily.    [provider]  Multiple Vitamins-Minerals (MULTIVITAL) tablet Take 1 tablet by mouth daily.    [provider]     Results for orders placed or performed during the hospital encounter of 11/04/16 (from the past 48 hour(s))  Comprehensive metabolic panel     Status: Abnormal   Collection Time: 11/04/16  6:54 PM  Result Value Ref Range   Sodium 138 135 - 145 mmol/L   Potassium 3.7 3.5 - 5.1 mmol/L   Chloride 104 101 - 111 mmol/L   CO2 24 22 - 32 mmol/L   Glucose, Bld 134 (H) 65 - 99 mg/dL   BUN 35 (H) 6 - 20 mg/dL   Creatinine, Ser 1.19 (H) 0.44 - 1.00 mg/dL   Calcium 8.8 (L) 8.9 - 10.3 mg/dL   Total Protein 6.9 6.5 - 8.1 g/dL   Albumin 3.4 (L) 3.5 - 5.0 g/dL   AST 165 (H) 15 - 41 U/L   ALT 58 (H) 14 - 54 U/L   Alkaline Phosphatase 54 38 - 126 U/L   Total Bilirubin 1.4 (H) 0.3 - 1.2 mg/dL   GFR calc non Af Amer 40 (L) >60 mL/min   GFR calc Af Amer 46 (L) >60 mL/min  Comment: (NOTE) The eGFR has been calculated using the CKD EPI equation. This calculation has not been validated in all clinical situations. eGFR's persistently <60 mL/min signify possible Chronic Kidney Disease.    Anion gap 10 5 - 15  Troponin I     Status: Abnormal   Collection Time: 11/04/16  6:54 PM  Result Value Ref Range   Troponin I 0.06 (HH) <0.03 ng/mL    Comment: CRITICAL RESULT CALLED TO, READ BACK BY AND VERIFIED WITH KENNY PATEL AT 2100 ON 11/04/2016 JJB   CBC with Differential     Status: Abnormal   Collection Time: 11/04/16  6:54 PM  Result Value Ref Range   WBC 7.6 3.6 - 11.0 K/uL   RBC 3.35 (L) 3.80 - 5.20 MIL/uL   Hemoglobin 10.2 (L) 12.0 - 16.0 g/dL   HCT 31.2 (L) 35.0 - 47.0 %   MCV 93.1 80.0 - 100.0 fL    MCH 30.4 26.0 - 34.0 pg   MCHC 32.6 32.0 - 36.0 g/dL   RDW 13.6 11.5 - 14.5 %   Platelets 153 150 - 440 K/uL   Neutrophils Relative % 81 %   Neutro Abs 6.1 1.4 - 6.5 K/uL   Lymphocytes Relative 12 %   Lymphs Abs 0.9 (L) 1.0 - 3.6 K/uL   Monocytes Relative 7 %   Monocytes Absolute 0.6 0.2 - 0.9 K/uL   Eosinophils Relative 0 %   Eosinophils Absolute 0.0 0 - 0.7 K/uL   Basophils Relative 0 %   Basophils Absolute 0.0 0 - 0.1 K/uL  Urinalysis, Complete w Microscopic     Status: Abnormal   Collection Time: 11/04/16  6:54 PM  Result Value Ref Range   Color, Urine YELLOW (A) YELLOW   APPearance CLEAR (A) CLEAR   Specific Gravity, Urine 1.021 1.005 - 1.030   pH 5.0 5.0 - 8.0   Glucose, UA NEGATIVE NEGATIVE mg/dL   Hgb urine dipstick MODERATE (A) NEGATIVE   Bilirubin Urine NEGATIVE NEGATIVE   Ketones, ur NEGATIVE NEGATIVE mg/dL   Protein, ur 30 (A) NEGATIVE mg/dL   Nitrite NEGATIVE NEGATIVE   Leukocytes, UA NEGATIVE NEGATIVE   RBC / HPF 0-5 0 - 5 RBC/hpf   WBC, UA 0-5 0 - 5 WBC/hpf   Bacteria, UA RARE (A) NONE SEEN   Squamous Epithelial / LPF 0-5 (A) NONE SEEN   Mucus PRESENT    Hyaline Casts, UA PRESENT   Brain natriuretic peptide     Status: Abnormal   Collection Time: 11/04/16  6:54 PM  Result Value Ref Range   B Natriuretic Peptide 303.0 (H) 0.0 - 100.0 pg/mL  TSH     Status: None   Collection Time: 11/05/16  1:30 AM  Result Value Ref Range   TSH 2.305 0.350 - 4.500 uIU/mL    Comment: Performed by a 3rd Generation assay with a functional sensitivity of <=0.01 uIU/mL.  Troponin I     Status: Abnormal   Collection Time: 11/05/16  1:30 AM  Result Value Ref Range   Troponin I 0.05 (HH) <0.03 ng/mL    Comment: CRITICAL VALUE NOTED. VALUE IS CONSISTENT WITH PREVIOUSLY REPORTED/CALLED VALUE RWW    Ct Head Wo Contrast  Result Date: 11/04/2016 CLINICAL DATA:  83 year old with increasing weakness since last night. Suspected stroke. EXAM: CT HEAD WITHOUT CONTRAST TECHNIQUE:  Contiguous axial images were obtained from the base of the skull through the vertex without intravenous contrast. COMPARISON:  None. FINDINGS: Brain: There is no evidence of acute  intracranial hemorrhage, mass lesion, brain edema or extra-axial fluid collection. There is prominence of the ventricles and subarachnoid spaces consistent with mild atrophy. There is no CT evidence of acute cortical infarction. There are mild chronic small vessel ischemic changes in the periventricular white matter. Vascular: Intracranial vascular calcifications are present. No hyperdense vessel identified. Skull: Negative for fracture or focal lesion. Sinuses/Orbits: The visualized paranasal sinuses and mastoid air cells are clear. No orbital abnormalities are seen. Other: None. IMPRESSION: 1. No evidence of acute/subacute stroke or other acute intracranial findings. 2. Mild atrophy and chronic periventricular white matter disease. Electronically Signed   By: Richardean Sale M.D.   On: 11/04/2016 19:45   Dg Chest Portable 1 View  Result Date: 11/04/2016 CLINICAL DATA:  Increased weakness and bilateral lower extremity cellulitis. EXAM: PORTABLE CHEST 1 VIEW COMPARISON:  None. FINDINGS: Lungs are adequately inflated with subtle prominence of the perihilar markings which may be due to mild vascular congestion. There is no lobar consolidation or effusion. Cardiomediastinal silhouette is within normal. There is calcified plaque over the aortic arch. There are degenerative changes of the spine. IMPRESSION: Possible mild vascular congestion. Electronically Signed   By: Marin Olp M.D.   On: 11/04/2016 18:48    Review of Systems  Constitutional: Negative for chills and fever.  HENT: Negative for sore throat and tinnitus.   Eyes: Negative for blurred vision and redness.  Respiratory: Negative for cough and shortness of breath.   Cardiovascular: Positive for leg swelling (chronic). Negative for chest pain, palpitations, orthopnea  and PND.  Gastrointestinal: Negative for abdominal pain, diarrhea, nausea and vomiting.  Genitourinary: Negative for dysuria, frequency and urgency.  Musculoskeletal: Negative for joint pain and myalgias.  Skin: Negative for rash.       No lesions  Neurological: Positive for weakness. Negative for speech change and focal weakness.  Endo/Heme/Allergies: Does not bruise/bleed easily.       No temperature intolerance  Psychiatric/Behavioral: Negative for depression and suicidal ideas.    Blood pressure (!) 110/36, pulse 78, temperature 98.9 F (37.2 C), temperature source Oral, resp. rate 18, height _0  (1.778 m), weight 102.5 kg (226 lb), SpO2 100 %. Physical Exam  Vitals reviewed. Constitutional: She is oriented to person, place, and time. She appears well-developed and well-nourished. No distress.  HENT:  Head: Normocephalic and atraumatic.  Mouth/Throat: Oropharynx is clear and moist.  Eyes: Pupils are equal, round, and reactive to light. Conjunctivae and EOM are normal. No scleral icterus.  Neck: Normal range of motion. Neck supple. No JVD present. No tracheal deviation present. No thyromegaly present.  Cardiovascular: Normal rate, regular rhythm and normal heart sounds.  Exam reveals no gallop and no friction rub.   No murmur heard. Respiratory: Effort normal and breath sounds normal.  GI: Soft. Bowel sounds are normal. She exhibits no distension. There is no tenderness.  Genitourinary:  Genitourinary Comments: Deferred  Musculoskeletal: Normal range of motion. She exhibits edema (3+).  Lymphadenopathy:    She has no cervical adenopathy.  Neurological: She is alert and oriented to person, place, and time. No cranial nerve deficit. She exhibits normal muscle tone.  Skin: Skin is warm and dry. No rash noted. There is erythema.  Healing venous stasis/cellulitic wounds  Psychiatric: She has a normal mood and affect. Her behavior is normal. Judgment and thought content normal.      Assessment/Plan This is an 83 year old female admitted for acute kidney injury. 1. AKI: Review the patient's records shows that she does not  have chronic kidney disease. Current kidney injury likely prerenal secondary to poor by mouth intake and/or poor perfusion. I have not started the patient on fluid at this time and she has lymphedema and BNP is elevated. We will avoid nephrotoxic agents. Encourage by mouth intake. 2. Elevated troponin: Patient denies chest pain. EKG does not indicate ischemia. Continue to monitor telemetry and follow cardiac biomarkers. Cardiology consult. Continue aspirin 3. Lymphedema: Chronic; the patient had been refusing leg wraps although I have convinced her that they would be helpful. Place Haematologist. Cellulitis appears to have improved and his superficial at this time. Continue Lasix 4. Weakness: Generalized. PT/OT eval. Morphine as needed for severe pain 5. DVT prophylaxis: Heparin 6. GI prophylaxis: None The patient is a full code. Time spent on admission orders and patient care approximately 45 minutes  Harrie Foreman, MD 11/05/2016, 3:15 AM

## 2016-11-05 NOTE — Evaluation (Signed)
Physical Therapy Evaluation Patient Details Name: [name] [name] MRN: [id number] DOB: May 09, 1926 Today's Date: 11/05/2016   History of Present Illness  presented to ER secondary to generalized pain, weakness in bilat LEs; admitted with AKI, UTI.  Of note, recent hospitalization approx 1 week prior to due to LE cellulitis; currently with unna boots in place.  Clinical Impression  Upon evaluation, patient alert and oriented; follows all commands and demonstrates good effort with all tasks.  Bilat LEs generally weak and deconditioned, with strength grossly 3-/5 throughout.  Currently requiring min assist for bed mobility; mod assist for sit/stand, basic transfers and very short-distance gait (5') with RW.  Poor standing balance with intermittent LE buckling noted during mobility efforts; very high risk for fall.  Unsafe to attempt without RW and heavy +1 assist at all times. Would benefit from skilled PT to address above deficits and promote optimal return to PLOF; recommend transition to STR upon discharge from acute hospitalization.     Follow Up Recommendations SNF    Equipment Recommendations  Rolling walker with 5" wheels    Recommendations for Other Services       Precautions / Restrictions Precautions Precautions: Fall Restrictions Weight Bearing Restrictions: No      Mobility  Bed Mobility Overal bed mobility: Needs Assistance Bed Mobility: Supine to Sit     Supine to sit: Min assist        Transfers Overall transfer level: Needs assistance Equipment used: Rolling walker (2 wheeled) Transfers: Sit to/from Stand Sit to Stand: Mod assist         General transfer comment: cuing for hand/foot placement; extensive assist for lift off and standing balance  Ambulation/Gait Ambulation/Gait assistance: Mod assist Ambulation Distance (Feet): 5 Feet Assistive device: Rolling walker (2 wheeled)       General Gait Details: broad BOS with bilat genu varus (L > R);  forward flexed posture, mod WBing on RW.  Poor balance requiring hands-on assist at all times.  Mild LE buckling requiring min/mod assist for correction; unable to tolerate additional distances as result  Stairs            Wheelchair Mobility    Modified Rankin (Stroke Patients Only)       Balance Overall balance assessment: Needs assistance Sitting-balance support: No upper extremity supported;Feet supported Sitting balance-Leahy Scale: Good     Standing balance support: Bilateral upper extremity supported Standing balance-Leahy Scale: Poor                               Pertinent Vitals/Pain Pain Assessment: No/denies pain    Home Living Family/patient expects to be discharged to:: Private residence Living Arrangements: Alone Available Help at Discharge: Family;Available PRN/intermittently Type of Home: House Home Access: Stairs to enter Entrance Stairs-Rails: None Entrance Stairs-Number of Steps: 3 Home Layout: One level Home Equipment: Walker - standard      Prior Function Level of Independence: Independent with assistive device(s)         Comments: Mod indep for ADLs, household mobilization with SW; denies fall history.  Son/grandson assists with community needs/errands as able     Hand Dominance        Extremity/Trunk Assessment   Upper Extremity Assessment Upper Extremity Assessment: Overall WFL for tasks assessed    Lower Extremity Assessment Lower Extremity Assessment: Generalized weakness (grossly 3-/5 throughout, generally edematous with bilat unna boots in place)       Communication  Communication: No difficulties  Cognition Arousal/Alertness: Awake/alert Behavior During Therapy: WFL for tasks assessed/performed Overall Cognitive Status: Within Functional Limits for tasks assessed                                        General Comments      Exercises Other Exercises Other Exercises: Additional reps  sit/stand with emphasis on hand/foot placement, forward weight shift and LE strength/power for lift off. Unable to complete without mod assist from therapist; generally requiring increased assist from lower surface heights Other Exercises: Educated in seated LE therex (ankle pumps, quad sets, LAQs) for use as HEP; patient voiced understanding, returned demonstration.  Encouraged performance outside of therapy as tolerated to promote progressive LE strengthening.   Assessment/Plan    PT Assessment Patient needs continued PT services  PT Problem List Decreased strength;Decreased range of motion;Decreased activity tolerance;Decreased balance;Decreased mobility;Decreased coordination;Decreased cognition;Decreased knowledge of use of DME;Decreased safety awareness;Decreased knowledge of precautions;Obesity       PT Treatment Interventions DME instruction;Gait training;Functional mobility training;Stair training;Therapeutic exercise;Balance training;Therapeutic activities;Patient/family education    PT Goals (Current goals can be found in the Care Plan section)  Acute Rehab PT Goals Patient Stated Goal: to get LEs stronger PT Goal Formulation: With patient Time For Goal Achievement: 11/19/16 Potential to Achieve Goals: Good    Frequency Min 2X/week   Barriers to discharge Decreased caregiver support      Co-evaluation               AM-PAC PT "6 Clicks" Daily Activity  Outcome Measure Difficulty turning over in bed (including adjusting bedclothes, sheets and blankets)?: Unable Difficulty moving from lying on back to sitting on the side of the bed? : Unable Difficulty sitting down on and standing up from a chair with arms (e.g., wheelchair, bedside commode, etc,.)?: Unable Help needed moving to and from a bed to chair (including a wheelchair)?: A Lot Help needed walking in hospital room?: A Lot Help needed climbing 3-5 steps with a railing? : Total 6 Click Score: 8    End of  Session Equipment Utilized During Treatment: Gait belt Activity Tolerance: Patient tolerated treatment well Patient left: in chair;with call bell/phone within reach;with chair alarm set Nurse Communication: Mobility status PT Visit Diagnosis: Difficulty in walking, not elsewhere classified (R26.2);Muscle weakness (generalized) (M62.81)    Time: 1610-9604 PT Time Calculation (min) (ACUTE ONLY): 28 min   Charges:   PT Evaluation $PT Eval Low Complexity: 1 Low PT Treatments $Therapeutic Activity: 8-22 mins   PT G Codes:   PT G-Codes **NOT FOR INPATIENT CLASS** Functional Assessment Tool Used: AM-PAC 6 Clicks Basic Mobility Functional Limitation: Mobility: Walking and moving around Mobility: Walking and Moving Around Current Status ([id number]): At least 80 percent but less than 100 percent impaired, limited or restricted Mobility: Walking and Moving Around Goal Status [phone]): At least 1 percent but less than 20 percent impaired, limited or restricted    Tajee Savant H. Manson Passey, PT, DPT, NCS 11/05/16, 3:43 PM [phone]

## 2016-11-05 NOTE — Consult Note (Signed)
Reason for Consult: Shortness of breath edema weakness Referring Physician: Dr. Michael diamond hospitalist  Debbie Klumpp is an 83 y.o. female.  HPI: Patient's a 83-year-old female recent admission for cellulitis of lower extremities presented emergency room with generalized weakness patient states she's had some pain difficulty with moving with weakness standing patient also developed a urinary tract infection laboratory suggested elevated creatinine and troponins. Patient denies any significant chest pain which is complaining of generalized weakness and difficulty sleeping. Patient was admitted to telemetry for further cardiac assessment evaluation. Patient is currently very lethargic and sleepy but arousable.  Past Medical History:  Diagnosis Date  . Cellulitis 10/28/2016   bilateral lower extremities.     Past Surgical History:  Procedure Laterality Date  . ABDOMINAL HYSTERECTOMY    . GANGLION CYST EXCISION      Family History  Problem Relation Age of Onset  . Diabetes Mellitus II Mother   . Heart disease Father     Social History:  reports that she has never smoked. She has never used smokeless tobacco. She reports that she does not drink alcohol or use drugs.  Allergies: Not on File  Medications: I have reviewed the patient's current medications.  Results for orders placed or performed during the hospital encounter of 11/04/16 (from the past 48 hour(s))  Comprehensive metabolic panel     Status: Abnormal   Collection Time: 11/04/16  6:54 PM  Result Value Ref Range   Sodium 138 135 - 145 mmol/L   Potassium 3.7 3.5 - 5.1 mmol/L   Chloride 104 101 - 111 mmol/L   CO2 24 22 - 32 mmol/L   Glucose, Bld 134 (H) 65 - 99 mg/dL   BUN 35 (H) 6 - 20 mg/dL   Creatinine, Ser 1.19 (H) 0.44 - 1.00 mg/dL   Calcium 8.8 (L) 8.9 - 10.3 mg/dL   Total Protein 6.9 6.5 - 8.1 g/dL   Albumin 3.4 (L) 3.5 - 5.0 g/dL   AST 165 (H) 15 - 41 U/L   ALT 58 (H) 14 - 54 U/L   Alkaline Phosphatase  54 38 - 126 U/L   Total Bilirubin 1.4 (H) 0.3 - 1.2 mg/dL   GFR calc non Af Amer 40 (L) >60 mL/min   GFR calc Af Amer 46 (L) >60 mL/min    Comment: (NOTE) The eGFR has been calculated using the CKD EPI equation. This calculation has not been validated in all clinical situations. eGFR's persistently <60 mL/min signify possible Chronic Kidney Disease.    Anion gap 10 5 - 15  Troponin I     Status: Abnormal   Collection Time: 11/04/16  6:54 PM  Result Value Ref Range   Troponin I 0.06 (HH) <0.03 ng/mL    Comment: CRITICAL RESULT CALLED TO, READ BACK BY AND VERIFIED WITH KENNY PATEL AT 2100 ON 11/04/2016 JJB   CBC with Differential     Status: Abnormal   Collection Time: 11/04/16  6:54 PM  Result Value Ref Range   WBC 7.6 3.6 - 11.0 K/uL   RBC 3.35 (L) 3.80 - 5.20 MIL/uL   Hemoglobin 10.2 (L) 12.0 - 16.0 g/dL   HCT 31.2 (L) 35.0 - 47.0 %   MCV 93.1 80.0 - 100.0 fL   MCH 30.4 26.0 - 34.0 pg   MCHC 32.6 32.0 - 36.0 g/dL   RDW 13.6 11.5 - 14.5 %   Platelets 153 150 - 440 K/uL   Neutrophils Relative % 81 %   Neutro Abs 6.1   1.4 - 6.5 K/uL   Lymphocytes Relative 12 %   Lymphs Abs 0.9 (L) 1.0 - 3.6 K/uL   Monocytes Relative 7 %   Monocytes Absolute 0.6 0.2 - 0.9 K/uL   Eosinophils Relative 0 %   Eosinophils Absolute 0.0 0 - 0.7 K/uL   Basophils Relative 0 %   Basophils Absolute 0.0 0 - 0.1 K/uL  Urinalysis, Complete w Microscopic     Status: Abnormal   Collection Time: 11/04/16  6:54 PM  Result Value Ref Range   Color, Urine YELLOW (A) YELLOW   APPearance CLEAR (A) CLEAR   Specific Gravity, Urine 1.021 1.005 - 1.030   pH 5.0 5.0 - 8.0   Glucose, UA NEGATIVE NEGATIVE mg/dL   Hgb urine dipstick MODERATE (A) NEGATIVE   Bilirubin Urine NEGATIVE NEGATIVE   Ketones, ur NEGATIVE NEGATIVE mg/dL   Protein, ur 30 (A) NEGATIVE mg/dL   Nitrite NEGATIVE NEGATIVE   Leukocytes, UA NEGATIVE NEGATIVE   RBC / HPF 0-5 0 - 5 RBC/hpf   WBC, UA 0-5 0 - 5 WBC/hpf   Bacteria, UA RARE (A) NONE  SEEN   Squamous Epithelial / LPF 0-5 (A) NONE SEEN   Mucus PRESENT    Hyaline Casts, UA PRESENT   Brain natriuretic peptide     Status: Abnormal   Collection Time: 11/04/16  6:54 PM  Result Value Ref Range   B Natriuretic Peptide 303.0 (H) 0.0 - 100.0 pg/mL  TSH     Status: None   Collection Time: 11/05/16  1:30 AM  Result Value Ref Range   TSH 2.305 0.350 - 4.500 uIU/mL    Comment: Performed by a 3rd Generation assay with a functional sensitivity of <=0.01 uIU/mL.  Troponin I     Status: Abnormal   Collection Time: 11/05/16  1:30 AM  Result Value Ref Range   Troponin I 0.05 (HH) <0.03 ng/mL    Comment: CRITICAL VALUE NOTED. VALUE IS CONSISTENT WITH PREVIOUSLY REPORTED/CALLED VALUE RWW   Troponin I     Status: Abnormal   Collection Time: 11/05/16  6:48 AM  Result Value Ref Range   Troponin I 0.06 (HH) <0.03 ng/mL    Comment: CRITICAL VALUE NOTED. VALUE IS CONSISTENT WITH PREVIOUSLY REPORTED/CALLED VALUE. [place].    Ct Head Wo Contrast  Result Date: 11/04/2016 CLINICAL DATA:  83 year old with increasing weakness since last night. Suspected stroke. EXAM: CT HEAD WITHOUT CONTRAST TECHNIQUE: Contiguous axial images were obtained from the base of the skull through the vertex without intravenous contrast. COMPARISON:  None. FINDINGS: Brain: There is no evidence of acute intracranial hemorrhage, mass lesion, brain edema or extra-axial fluid collection. There is prominence of the ventricles and subarachnoid spaces consistent with mild atrophy. There is no CT evidence of acute cortical infarction. There are mild chronic small vessel ischemic changes in the periventricular white matter. Vascular: Intracranial vascular calcifications are present. No hyperdense vessel identified. Skull: Negative for fracture or focal lesion. Sinuses/Orbits: The visualized paranasal sinuses and mastoid air cells are clear. No orbital abnormalities are seen. Other: None. IMPRESSION: 1. No evidence of acute/subacute  stroke or other acute intracranial findings. 2. Mild atrophy and chronic periventricular white matter disease. Electronically Signed   By: Richardean Sale M.D.   On: 11/04/2016 19:45   Dg Chest Portable 1 View  Result Date: 11/04/2016 CLINICAL DATA:  Increased weakness and bilateral lower extremity cellulitis. EXAM: PORTABLE CHEST 1 VIEW COMPARISON:  None. FINDINGS: Lungs are adequately inflated with subtle prominence of the perihilar markings  which may be due to mild vascular congestion. There is no lobar consolidation or effusion. Cardiomediastinal silhouette is within normal. There is calcified plaque over the aortic arch. There are degenerative changes of the spine. IMPRESSION: Possible mild vascular congestion. Electronically Signed   By: Marin Olp M.D.   On: 11/04/2016 18:48    Review of Systems  Constitutional: Positive for diaphoresis and malaise/fatigue.  HENT: Positive for congestion.   Eyes: Negative.   Respiratory: Positive for shortness of breath.   Cardiovascular: Positive for orthopnea, leg swelling and PND.  Gastrointestinal: Negative.   Genitourinary: Positive for dysuria and frequency.  Musculoskeletal: Positive for falls, joint pain and myalgias.  Skin: Negative.   Neurological: Positive for dizziness, focal weakness and weakness.  Endo/Heme/Allergies: Negative.   Psychiatric/Behavioral: Negative.    Blood pressure (!) 133/59, pulse 80, temperature 98.4 F (36.9 C), temperature source Oral, resp. rate 18, height 5' 10" (1.778 m), weight 227 lb 11.2 oz (103.3 kg), SpO2 100 %. Physical Exam  Nursing note and vitals reviewed. Constitutional: She is oriented to person, place, and time. She appears well-developed and well-nourished.  HENT:  Head: Normocephalic and atraumatic.  Right Ear: External ear normal.  Eyes: Pupils are equal, round, and reactive to light. Conjunctivae and EOM are normal.  Neck: Normal range of motion. Neck supple.  Cardiovascular: Normal rate  and regular rhythm.  Exam reveals gallop.   Murmur heard. Respiratory: Effort normal and breath sounds normal.  GI: Soft. Bowel sounds are normal.  Musculoskeletal: She exhibits edema.  Neurological: She is alert and oriented to person, place, and time. She has normal reflexes. She exhibits abnormal muscle tone. Coordination abnormal.  Skin: Skin is warm and dry.  Psychiatric: She has a normal mood and affect.    Assessment/Plan: Shortness of breath Edema Generalized weakness Acute on chronic renal insufficiency Transaminitis Elevated troponin Cellulitis .  Plan Agree with admit to telemetry Recommend gentle hydration Consider diuresis for shortness of breath and mild heart failure Support stockings and elevation Agree with supplemental iron therapy Recommend nephrology follow-up and evaluation DVT prophylaxis Recommend conservative cardiac input at this point Consider physical therapy  Kyria Bumgardner D Kayde Atkerson 11/05/2016, 12:16 PM

## 2016-11-06 LAB — COMPREHENSIVE METABOLIC PANEL
ALK PHOS: 39 U/L (ref 38–126)
ALT: 45 U/L (ref 14–54)
ANION GAP: 4 — AB (ref 5–15)
AST: 90 U/L — ABNORMAL HIGH (ref 15–41)
Albumin: 2.5 g/dL — ABNORMAL LOW (ref 3.5–5.0)
BUN: 34 mg/dL — ABNORMAL HIGH (ref 6–20)
CALCIUM: 8.1 mg/dL — AB (ref 8.9–10.3)
CO2: 24 mmol/L (ref 22–32)
CREATININE: 0.92 mg/dL (ref 0.44–1.00)
Chloride: 112 mmol/L — ABNORMAL HIGH (ref 101–111)
GFR calc non Af Amer: 54 mL/min — ABNORMAL LOW (ref >60)
GLUCOSE: 100 mg/dL — AB (ref 65–99)
Potassium: 3.9 mmol/L (ref 3.5–5.1)
Sodium: 140 mmol/L (ref 135–145)
TOTAL PROTEIN: 5.2 g/dL — AB (ref 6.5–8.1)
Total Bilirubin: 0.7 mg/dL (ref 0.3–1.2)

## 2016-11-06 NOTE — Progress Notes (Signed)
Rounded with physician. Pt denies complains of pain. Pt resting comfortably, Pt remains in NRS, vss. Spoke with [name], wants mother to go to rehab. Pt agreed to go to rehab. Awaiting bed. Dr. Enedina FinnerSona Patel made aware. Will continue to monitor.

## 2016-11-06 NOTE — NC FL2 (Signed)
[place] MEDICAID FL2 LEVEL OF CARE SCREENING TOOL     IDENTIFICATION  Patient Name: [name] [name] Birthdate: 1926/01/30 Sex: female Admission Date (Current Location): 11/04/2016  [place]   Facility and Address:  [facility], [address], [place], [place] [address]      Provider Number: [id number]  Attending Physician Name and Address:  Enedina FinnerPatel, Sona, MD  Relative Name and Phone Number:  Lavera GuiseJerry Keiper (son) [phone]    Current Level of Care: Hospital Recommended Level of Care: Skilled Nursing Facility Prior Approval Number:    Date Approved/Denied:   PASRR Number: [id number]  Discharge Plan: SNF    Current Diagnoses: Patient Active Problem List   Diagnosis Date Noted  . AKI (acute kidney injury) (HCC) 11/05/2016  . Pressure injury of skin 11/05/2016  . Elevated troponin 11/04/2016    Orientation RESPIRATION BLADDER Height & Weight     Self, Time, Situation, Place  Normal Continent Weight: 230 lb 9.6 oz (104.6 kg) Height:  5\' 10"  (177.8 cm)  BEHAVIORAL SYMPTOMS/MOOD NEUROLOGICAL BOWEL NUTRITION STATUS      Continent Diet (Heart healthy)  AMBULATORY STATUS COMMUNICATION OF NEEDS Skin   Extensive Assist Verbally Normal                       Personal Care Assistance Level of Assistance  Bathing, Feeding, Dressing Bathing Assistance: Limited assistance Feeding assistance: Independent Dressing Assistance: Limited assistance     Functional Limitations Info             SPECIAL CARE FACTORS FREQUENCY  PT (By licensed PT), OT (By licensed OT)     PT Frequency: Up to 5X per day, 5 days per week OT Frequency: Up to 5X per day, 5 days per week            Contractures Contractures Info: Not present    Additional Factors Info  Code Status, Allergies Code Status Info: Full Allergies Info: NKA           Current Medications (11/06/2016):  This is the current hospital active  medication list Current Facility-Administered Medications  Medication Dose Route Frequency Provider Last Rate Last Dose  . acetaminophen (TYLENOL) tablet 650 mg  650 mg Oral Q6H PRN Arnaldo Nataliamond, Michael S, MD       Or  . acetaminophen (TYLENOL) suppository 650 mg  650 mg Rectal Q6H PRN Arnaldo Nataliamond, Michael S, MD      . aspirin-acetaminophen-caffeine [facility](EXCEDRIN MIGRAINE) per tablet 2 tablet  2 tablet Oral Daily Arnaldo Nataliamond, Michael S, MD   2 tablet at 11/06/16 0901  . docusate sodium (COLACE) capsule 100 mg  100 mg Oral BID Arnaldo Nataliamond, Michael S, MD   100 mg at 11/06/16 0901  . ferrous sulfate tablet 325 mg  325 mg Oral Daily Arnaldo Nataliamond, Michael S, MD   325 mg at 11/06/16 0901  . heparin injection 5,000 Units  5,000 Units Subcutaneous Q8H Arnaldo Nataliamond, Michael S, MD   5,000 Units at 11/06/16 0901  . ketoconazole (NIZORAL) 2 % cream 1 application  1 application Topical Daily Arnaldo Nataliamond, Michael S, MD   1 application at 11/06/16 0902  . multivitamin with minerals tablet 1 tablet  1 tablet Oral Daily Arnaldo Nataliamond, Michael S, MD   1 tablet at 11/06/16 0901  . ondansetron (ZOFRAN) tablet 4 mg  4 mg Oral Q6H PRN Arnaldo Nataliamond, Michael S, MD       Or  . ondansetron [facility](ZOFRAN) injection 4 mg  4  mg Intravenous Q6H PRN Arnaldo Natal, MD      . potassium chloride (K-DUR) CR tablet 10 mEq  10 mEq Oral Daily Arnaldo Natal, MD   10 mEq at 11/06/16 0900  . traMADol (ULTRAM) tablet 50 mg  50 mg Oral Q6H PRN Arnaldo Natal, MD      . vitamin C (ASCORBIC ACID) tablet 250 mg  250 mg Oral Daily Arnaldo Natal, MD   250 mg at 11/06/16 0900     Discharge Medications: Please see discharge summary for a list of discharge medications.  Relevant Imaging Results:  Relevant Lab Results:   Additional Information SS# [id number]  Judi Cong, LCSW

## 2016-11-06 NOTE — Progress Notes (Signed)
Patient requested her prevalon boots to be removed because she wasn't comfortably wearing them.  The RN provided education on the importance of keeping the boots on. Patient verbalized her understanding but still refused to wear them.  Prevalon boots on bilateral LE removed per patient's request. Will continue to monitor.

## 2016-11-06 NOTE — Progress Notes (Signed)
[facility] [place] [facility]   PATIENT NAME: [name] [name]    MR#:  [id number]  DATE OF BIRTH:  1926/04/14  SUBJECTIVE:  Patient came with increasing weakness difficulty ambulating at home.  Denies any chest pain. Recently finished a course of Keflex and doxycycline for her lower extremity cellulitis/redness no fever Feels a lot better today  REVIEW OF SYSTEMS:   Review of Systems  Constitutional: Negative for chills, fever and weight loss.  HENT: Negative for ear discharge, ear pain and nosebleeds.   Eyes: Negative for blurred vision, pain and discharge.  Respiratory: Negative for sputum production, shortness of breath, wheezing and stridor.   Cardiovascular: Positive for leg swelling. Negative for chest pain, palpitations, orthopnea and PND.  Gastrointestinal: Negative for abdominal pain, diarrhea, nausea and vomiting.  Genitourinary: Negative for frequency and urgency.  Musculoskeletal: Negative for back pain and joint pain.  Skin: Positive for rash.  Neurological: Positive for weakness. Negative for sensory change, speech change and focal weakness.  Psychiatric/Behavioral: Negative for depression and hallucinations. The patient is not nervous/anxious.    Tolerating Diet:yes Tolerating FI:EPPIRJJOACZYPT:recommending rehab  DRUG ALLERGIES:  Not on File  VITALS:  Blood pressure 133/70, pulse 79, temperature 98.4 F (36.9 C), temperature source Oral, resp. rate 18, height 5\' 10"  (1.778 m), weight 104.6 kg (230 lb 9.6 oz), SpO2 96 %.  PHYSICAL EXAMINATION:   Physical Exam  GENERAL:  83 y.o.-year-old patient lying in the bed with no acute distress. obese EYES: Pupils equal, round, reactive to light and accommodation. No scleral icterus. Extraocular muscles intact.  HEENT: Head atraumatic, normocephalic. Oropharynx and nasopharynx clear.  NECK:  Supple, no jugular venous distention. No thyroid enlargement, no tenderness.  LUNGS: Normal breath sounds  bilaterally, no wheezing, rales, rhonchi. No use of accessory muscles of respiration.  CARDIOVASCULAR: S1, S2 normal. No murmurs, rubs, or gallops.  ABDOMEN: Soft, nontender, nondistended. Bowel sounds present. No organomegaly or mass.  EXTREMITIES: Bilateral lower extremity chronic mild edema with chronic hyperpigmentation.  Patient has some lymphedema NEUROLOGIC: Cranial nerves II through XII are intact. No focal Motor or sensory deficits b/l.   PSYCHIATRIC:  patient is alert and oriented x 3.  SKIN: No obvious rash, lesion, or ulcer.   LABORATORY PANEL:  CBC  Recent Labs Lab 11/04/16 1854  WBC 7.6  HGB 10.2*  HCT 31.2*  PLT 153    Chemistries   Recent Labs Lab 11/06/16 0434  NA 140  K 3.9  CL 112*  CO2 24  GLUCOSE 100*  BUN 34*  CREATININE 0.92  CALCIUM 8.1*  AST 90*  ALT 45  ALKPHOS 39  BILITOT 0.7   Cardiac Enzymes  Recent Labs Lab 11/05/16 1322  TROPONINI 0.06*   RADIOLOGY:  Ct Head Wo Contrast  Result Date: 11/04/2016 CLINICAL DATA:  83 year old with increasing weakness since last night. Suspected stroke. EXAM: CT HEAD WITHOUT CONTRAST TECHNIQUE: Contiguous axial images were obtained from the base of the skull through the vertex without intravenous contrast. COMPARISON:  None. FINDINGS: Brain: There is no evidence of acute intracranial hemorrhage, mass lesion, brain edema or extra-axial fluid collection. There is prominence of the ventricles and subarachnoid spaces consistent with mild atrophy. There is no CT evidence of acute cortical infarction. There are mild chronic small vessel ischemic changes in the periventricular white matter. Vascular: Intracranial vascular calcifications are present. No hyperdense vessel identified. Skull: Negative for fracture or focal lesion. Sinuses/Orbits: The visualized paranasal sinuses and mastoid air cells are clear. No  orbital abnormalities are seen. Other: None. IMPRESSION: 1. No evidence of acute/subacute stroke or other  acute intracranial findings. 2. Mild atrophy and chronic periventricular white matter disease. Electronically Signed   By: Carey Bullocks M.D.   On: 11/04/2016 19:45   Dg Chest Portable 1 View  Result Date: 11/04/2016 CLINICAL DATA:  Increased weakness and bilateral lower extremity cellulitis. EXAM: PORTABLE CHEST 1 VIEW COMPARISON:  None. FINDINGS: Lungs are adequately inflated with subtle prominence of the perihilar markings which may be due to mild vascular congestion. There is no lobar consolidation or effusion. Cardiomediastinal silhouette is within normal. There is calcified plaque over the aortic arch. There are degenerative changes of the spine. IMPRESSION: Possible mild vascular congestion. Electronically Signed   By: Elberta Fortis M.D.   On: 11/04/2016 18:48   ASSESSMENT AND PLAN:  83 year old female admitted for acute kidney injury.  1. AKI: Review the patient's records shows that she does not have chronic kidney disease. Current kidney injury likely prerenal secondary to poor by mouth intake and/or poor perfusion.  -Baseline creatinine is  0.9 -1.19--0.92 -Patient appears clinically dehydrated we will start her on normal saline at 75 cc an hour  We will avoid nephrotoxic agents. Encourage by mouth intake. -Hold Lasix  2. Elevated troponin:  Could be due to elevated creatinine  -patient denies chest pain. EKG does not indicate ischemia. Continue to monitor telemetry and follow cardiac biomarkers. - Cardiology consult appreciated - Continue aspirin  3. Lymphedema: Chronic; -No evidence of acute cellulitis.  Patient just finished a course of Keflex and doxycycline -Not sure if she needs Unna boot however will await wound care consult  4. Weakness: Generalized. PT/OT eval.--recommends Rehab. Pt wants to go home.  Awaiting son's call back.  5.  Elevated transaminases, mild--improving -Etiology unclear -We will continue to monitor.  Patient is asymptomatic.   Case discussed  with Care Management/Social Worker. Management plans discussed with the patient, family and they are in agreement.  CODE STATUS: Full  DVT Prophylaxis: Heparin  TOTAL TIME TAKING CARE OF THIS PATIENT: *30* minutes.  >50% time spent on counselling and coordination of care  POSSIBLE D/C IN *1-2 DAYS, DEPENDING ON CLINICAL CONDITION.  Note: This dictation was prepared with Dragon dictation along with smaller phrase technology. Any transcriptional errors that result from this process are unintentional.  Isreal Moline M.D on 11/06/2016 at 12:31 PM  Between 7am to 6pm - Pager - [phone]  After 6pm go to [email or web address] - password [email or web address]  [facility]  Office  [phone]  CC: Primary care physician; System, Pcp Not In

## 2016-11-06 NOTE — Clinical Social Work Note (Signed)
Clinical Social Work Assessment  Patient Details  Name: [name] [name] MRN: [id number] Date of Birth: July 07, 1926  Date of referral:  11/06/16               Reason for consult:  Facility Placement                Permission sought to share information with:  Facility Art therapist granted to share information::  Yes, Verbal Permission Granted  Name::        Agency::     Relationship::     Contact Information:     Housing/Transportation Living arrangements for the past 2 months:  [place] of Information:  Patient, Other (Comment Required), Adult Children Biomedical scientist) Patient Interpreter Needed:  None Criminal Activity/Legal Involvement Pertinent to Current Situation/Hospitalization:  No - Comment as needed Significant Relationships:  Adult Children, Church, [employer], Other Family Members Lives with:  Self Do you feel safe going back to the place where you live?  Yes Need for family participation in patient care:  No (Coment)  Care giving concerns:  PT recommendation for STR   Social Worker assessment / plan: CSW met with the patient, her son, and her grandson at bedside to discuss discharge planning. The patient reported that while she does not want to go to STR, she is willing to do so and named [facility] as her primary choice for placement. The CSW informed the patient and her family about the referral process, and they all gave verbal permission to conduct a bed search.  The CSW will provide bed offers as they are available. CSW will continue to follow to facilitate discharge.  Employment status:  Retired Nurse, adult PT Recommendations:  [place] / Referral to community resources:  [place]  Patient/Family's Response to care:  The patient and her family thanked the CSW for assistance and information.  Patient/Family's Understanding of and Emotional Response  to Diagnosis, Current Treatment, and Prognosis:  The patient was reticent to discharge to SNF at first; however, she agreed once she understood that it is STR and not LTC. The family is thankful that she has agreed and are aware of her need for STR.  Emotional Assessment Appearance:  Appears stated age Attitude/Demeanor/Rapport:   (Pleasant and alert) Affect (typically observed):  Appropriate, Pleasant Orientation:  Oriented to Self, Oriented to Place, Oriented to  Time, Oriented to Situation Alcohol / Substance use:  Never Used Psych involvement (Current and /or in the community):  No (Comment)  Discharge Needs  Concerns to be addressed:  Care Coordination, Discharge Planning Concerns Readmission within the last 30 days:  No Current discharge risk:  Chronically ill Barriers to Discharge:  Continued Medical Work up   [employer], LCSW 11/06/2016, 1:14 PM

## 2016-11-07 DIAGNOSIS — E860 Dehydration: Secondary | ICD-10-CM | POA: Diagnosis not present

## 2016-11-07 DIAGNOSIS — R4182 Altered mental status, unspecified: Secondary | ICD-10-CM | POA: Diagnosis not present

## 2016-11-07 DIAGNOSIS — R748 Abnormal levels of other serum enzymes: Secondary | ICD-10-CM | POA: Diagnosis not present

## 2016-11-07 DIAGNOSIS — Z7982 Long term (current) use of aspirin: Secondary | ICD-10-CM | POA: Diagnosis not present

## 2016-11-07 DIAGNOSIS — R531 Weakness: Secondary | ICD-10-CM | POA: Diagnosis not present

## 2016-11-07 DIAGNOSIS — D649 Anemia, unspecified: Secondary | ICD-10-CM | POA: Diagnosis not present

## 2016-11-07 DIAGNOSIS — R600 Localized edema: Secondary | ICD-10-CM | POA: Diagnosis not present

## 2016-11-07 DIAGNOSIS — I890 Lymphedema, not elsewhere classified: Secondary | ICD-10-CM | POA: Diagnosis not present

## 2016-11-07 DIAGNOSIS — N179 Acute kidney failure, unspecified: Secondary | ICD-10-CM | POA: Diagnosis not present

## 2016-11-07 DIAGNOSIS — M6281 Muscle weakness (generalized): Secondary | ICD-10-CM | POA: Diagnosis not present

## 2016-11-07 DIAGNOSIS — L89152 Pressure ulcer of sacral region, stage 2: Secondary | ICD-10-CM | POA: Diagnosis not present

## 2016-11-07 DIAGNOSIS — Z743 Need for continuous supervision: Secondary | ICD-10-CM | POA: Diagnosis not present

## 2016-11-07 MED ORDER — MULTIVITAL PO TABS: 1.0000 | ORAL_TABLET | Freq: Every day | ORAL | 0 refills | Status: DC

## 2016-11-07 MED ORDER — ASPIRIN-ACETAMINOPHEN-CAFFEINE 250-250-65 MG PO TABS
2.0000 | ORAL_TABLET | Freq: Every day | ORAL | Status: DC | PRN
  Filled 2016-11-07: qty 2

## 2016-11-07 MED ORDER — FERROUS SULFATE 325 (65 FE) MG PO TABS
325.0000 mg | ORAL_TABLET | Freq: Every day | ORAL | 3 refills | Status: DC
Start: 2016-11-07 — End: 2020-01-07

## 2016-11-07 MED ORDER — FUROSEMIDE 20 MG PO TABS: 20.0000 mg | ORAL_TABLET | Freq: Every day | ORAL | 0 refills | Status: DC | PRN

## 2016-11-07 MED ORDER — ASCORBIC ACID 100 MG PO TABS: 100.0000 mg | ORAL_TABLET | Freq: Every day | ORAL | 0 refills | Status: DC

## 2016-11-07 NOTE — Progress Notes (Signed)
Pts son Lavera GuiseJerry Holtman informed of pts discharge to [facility] by [employer], CSW. Granddaughter at bedside attending to pt and getting her ready for discharge. EMS called for pt transport to [facility], IV catheter discontinued, tip intact, telemetry monitor removed, pt stable and awaiting discharge.

## 2016-11-07 NOTE — Discharge Summary (Signed)
[facility] [place] [facility]   PATIENT NAME: [name] PatrickDorothy [name]    MR#:  [id number]  DATE OF BIRTH:  [birth year]  DATE OF ADMISSION:  11/04/2016 ADMITTING PHYSICIAN: Arnaldo NatalMichael S Diamond, MD  DATE OF DISCHARGE: 11/07/2016  PRIMARY CARE PHYSICIAN: System, Pcp Not In    ADMISSION DIAGNOSIS:  Weak [R53.1] Elevated troponin [R74.8] AKI (acute kidney injury) (HCC) [N17.9] Congestive heart failure, unspecified HF chronicity, unspecified heart failure type (HCC) [I50.9]  DISCHARGE DIAGNOSIS:  Acute renal failure due to dehydration--improved Bilateral LE lymphedema--improving. Unna boots applied  SECONDARY DIAGNOSIS:   Past Medical History:  Diagnosis Date  . Cellulitis 10/28/2016   bilateral lower extremities.     HOSPITAL COURSE:   83 year old female admitted for acute kidney injury.  1. WUJ:WJXBJYAKI:Review the patient's records shows that she does not have chronic kidney disease.  -acute kidney injury likely prerenal secondary to poor by mouth intake and/or poor perfusion.  -Baseline creatinine is  0.9 -1.19--0.92 -Patient appears clinically dehydrated now well hydrated with IVF We will avoid nephrotoxic agents. Encourage by mouth intake. -Hold Lasix  2. Elevated troponin: Could be due to elevated creatinine  -patient denies chest pain. EKG does not indicate ischemia.  - Cardiology consult appreciated - Continue aspirin  3. Lymphedema:Chronic; -No evidence of acute cellulitis.  Patient just finished a course of Keflex and doxycycline -Unna boots +  4. Weakness:Generalized. PT/OT eval.--recommends Rehab. Pt now agreeable for rehab.  Patient is medically stable.   insurance authorization for rehab bed approved  This was discussed with Child psychotherapistsocial worker.  5.  Elevated transaminases, mild--improving Patient is asymptomatic.    D/[facility] CONSULTS OBTAINED:  Treatment Team:  Alwyn Peaallwood, Dwayne D, MD  DRUG ALLERGIES:  Not on  File  DISCHARGE MEDICATIONS:   Current Discharge Medication List    CONTINUE these medications which have CHANGED   Details  ascorbic acid (VITAMIN C) 100 MG tablet Take 1 tablet (100 mg total) by mouth daily. Qty: 30 tablet, Refills: 0    ferrous sulfate 325 (65 FE) MG tablet Take 1 tablet (325 mg total) by mouth daily. Qty: 30 tablet, Refills: 3    furosemide (LASIX) 20 MG tablet Take 1 tablet (20 mg total) by mouth daily as needed for edema. Qty: 20 tablet, Refills: 0    Multiple Vitamins-Minerals (MULTIVITAL) tablet Take 1 tablet by mouth daily. Qty: 30 tablet, Refills: 0      CONTINUE these medications which have NOT CHANGED   Details  ketoconazole (NIZORAL) 2 % cream Apply 1 application topically daily.    potassium chloride (K-DUR) 10 MEQ tablet Take 1 tablet by mouth daily.    Aspirin-Caffeine 400-32 MG TABS Take 2 tablets by mouth daily.        If you experience worsening of your admission symptoms, develop shortness of breath, life threatening emergency, suicidal or homicidal thoughts you must seek medical attention immediately by calling 911 or calling your MD immediately  if symptoms less severe.  You Must read complete instructions/literature along with all the possible adverse reactions/side effects for all the Medicines you take and that have been prescribed to you. Take any new Medicines after you have completely understood and accept all the possible adverse reactions/side effects.   Please note  You were cared for by a hospitalist during your hospital stay. If you have any questions about your discharge medications or the care you received while you were in the hospital after you are discharged, you can call  the unit and asked to speak with the hospitalist on call if the hospitalist that took care of you is not available. Once you are discharged, your primary care physician will handle any further medical issues. Please note that NO REFILLS for any discharge  medications will be authorized once you are discharged, as it is imperative that you return to your primary care physician (or establish a relationship with a primary care physician if you do not have one) for your aftercare needs so that they can reassess your need for medications and monitor your lab values.   DATA REVIEW:   CBC   Recent Labs Lab 11/04/16 1854  WBC 7.6  HGB 10.2*  HCT 31.2*  PLT 153    Chemistries   Recent Labs Lab 11/06/16 0434  NA 140  K 3.9  CL 112*  CO2 24  GLUCOSE 100*  BUN 34*  CREATININE 0.92  CALCIUM 8.1*  AST 90*  ALT 45  ALKPHOS 39  BILITOT 0.7    Microbiology Results   No results found for this or any previous visit (from the past 240 hour(s)).  RADIOLOGY:  No results found.   Management plans discussed with the patient, family and they are in agreement.  CODE STATUS:     Code Status Orders        Start     Ordered   11/05/16 0104  Full code  Continuous     11/05/16 0104    Code Status History    Date Active Date Inactive Code Status Order ID Comments User Context   This patient has a current code status but no historical code status.      TOTAL TIME TAKING CARE OF THIS PATIENT: 40 minutes.    Masiyah Engen M.D on 11/07/2016 at 4:07 PM  Between 7am to 6pm - Pager - [phone] After 6pm go to [email or web address] - password [email or web address]  [facility] [place] [facility]  Office  [phone]  CC: Primary care physician; System, Pcp Not In

## 2016-11-07 NOTE — Progress Notes (Signed)
[facility] -  at [facility]   PATIENT NAME: [name] [name]    MR#:  [id number]  DATE OF BIRTH:  [birth year]  SUBJECTIVE:  Patient came with increasing weakness difficulty ambulating at home.  Denies any chest pain. Recently finished a course of Keflex and doxycycline for her lower extremity cellulitis/redness no fever Feels a lot better today  REVIEW OF SYSTEMS:   Review of Systems  Constitutional: Negative for chills, fever and weight loss.  HENT: Negative for ear discharge, ear pain and nosebleeds.   Eyes: Negative for blurred vision, pain and discharge.  Respiratory: Negative for sputum production, shortness of breath, wheezing and stridor.   Cardiovascular: Positive for leg swelling. Negative for chest pain, palpitations, orthopnea and PND.  Gastrointestinal: Negative for abdominal pain, diarrhea, nausea and vomiting.  Genitourinary: Negative for frequency and urgency.  Musculoskeletal: Negative for back pain and joint pain.  Skin: Positive for rash.  Neurological: Positive for weakness. Negative for sensory change, speech change and focal weakness.  Psychiatric/Behavioral: Negative for depression and hallucinations. The patient is not nervous/anxious.    Tolerating Diet:yes Tolerating WU:JWJXBJYNWGNF rehab  DRUG ALLERGIES:  Not on File  VITALS:  Blood pressure (!) 146/54, pulse 70, temperature 98.4 F (36.9 C), temperature source Oral, resp. rate 18, height 5\' 10"  (1.778 m), weight 105 kg (231 lb 6.4 oz), SpO2 99 %.  PHYSICAL EXAMINATION:   Physical Exam  GENERAL:  83 y.o.-year-old patient lying in the bed with no acute distress. obese EYES: Pupils equal, round, reactive to light and accommodation. No scleral icterus. Extraocular muscles intact.  HEENT: Head atraumatic, normocephalic. Oropharynx and nasopharynx clear.  NECK:  Supple, no jugular venous distention. No thyroid enlargement, no tenderness.  LUNGS: Normal breath sounds  bilaterally, no wheezing, rales, rhonchi. No use of accessory muscles of respiration.  CARDIOVASCULAR: S1, S2 normal. No murmurs, rubs, or gallops.  ABDOMEN: Soft, nontender, nondistended. Bowel sounds present. No organomegaly or mass.  EXTREMITIES: Bilateral lower extremity chronic mild edema with chronic hyperpigmentation.  Patient has some lymphedema NEUROLOGIC: Cranial nerves II through XII are intact. No focal Motor or sensory deficits b/l.   PSYCHIATRIC:  patient is alert and oriented x 3.  SKIN: No obvious rash, lesion, or ulcer.   LABORATORY PANEL:  CBC  Recent Labs Lab 11/04/16 1854  WBC 7.6  HGB 10.2*  HCT 31.2*  PLT 153    Chemistries   Recent Labs Lab 11/06/16 0434  NA 140  K 3.9  CL 112*  CO2 24  GLUCOSE 100*  BUN 34*  CREATININE 0.92  CALCIUM 8.1*  AST 90*  ALT 45  ALKPHOS 39  BILITOT 0.7   Cardiac Enzymes  Recent Labs Lab 11/05/16 1322  TROPONINI 0.06*   RADIOLOGY:  No results found. ASSESSMENT AND PLAN:  83 year old female admitted for acute kidney injury.  1. AKI: Review the patient's records shows that she does not have chronic kidney disease.  -acute kidney injury likely prerenal secondary to poor by mouth intake and/or poor perfusion.  -Baseline creatinine is  0.9 -1.19--0.92 -Patient appears clinically dehydrated now well hydrated with IVF We will avoid nephrotoxic agents. Encourage by mouth intake. -Hold Lasix  2. Elevated troponin:  Could be due to elevated creatinine  -patient denies chest pain. EKG does not indicate ischemia.  - Cardiology consult appreciated - Continue aspirin  3. Lymphedema: Chronic; -No evidence of acute cellulitis.  Patient just finished a course of Keflex and doxycycline -Unna boots +  4. Weakness: Generalized.  PT/OT eval.--recommends Rehab. Pt now agreeable for rehab.  Patient is medically stable.  Awaiting for insurance authorization for rehab bed.  This was discussed with Child psychotherapistsocial worker.  5.   Elevated transaminases, mild--improving -Etiology unclear -We will continue to monitor.  Patient is asymptomatic.   Case discussed with Care Management/Social Worker. Management plans discussed with the patient, family and they are in agreement.  CODE STATUS: Full  DVT Prophylaxis: Heparin  TOTAL TIME TAKING CARE OF THIS PATIENT: *30* minutes.  >50% time spent on counselling and coordination of care  POSSIBLE D/C IN *1-2 DAYS, DEPENDING ON CLINICAL CONDITION.  Note: This dictation was prepared with Dragon dictation along with smaller phrase technology. Any transcriptional errors that result from this process are unintentional.  [name] [name] M.D on 11/07/2016 at 10:44 AM  Between 7am to 6pm - Pager - [phone]  After 6pm go to [email or web address] - password [email or web address]  [facility] [place] [facility]  Office  [phone]  CC: Primary care physician; System, Pcp Not In

## 2016-11-07 NOTE — Clinical Social Work Placement (Signed)
   CLINICAL SOCIAL WORK PLACEMENT  NOTE  Date:  11/07/2016  Patient Details  Name: [name] [name] MRN: [id number] Date of Birth: [birth year]  Clinical Social Work is seeking post-discharge placement for this patient at the Skilled  Nursing Facility level of care (*CSW will initial, date and re-position this form in  chart as items are completed):  Yes   Patient/family provided with [place] Clinical Social Work Department's list of facilities offering this level of care within the geographic area requested by the patient (or if unable, by the patient's family).  Yes   Patient/family informed of their freedom to choose among providers that offer the needed level of care, that participate in Medicare, Medicaid or managed care program needed by the patient, have an available bed and are willing to accept the patient.  Yes   Patient/family informed of 's ownership interest in [facility] and [facility], as well as of the fact that they are under no obligation to receive care at these facilities.  PASRR submitted to EDS on       PASRR number received on       Existing PASRR number confirmed on 11/06/16     FL2 transmitted to all facilities in geographic area requested by pt/family on 11/06/16     FL2 transmitted to all facilities within larger geographic area on       Patient informed that his/her managed care company has contracts with or will negotiate with certain facilities, including the following:        Yes   Patient/family informed of bed offers received.  Patient chooses bed at [facility] [place]     Physician recommends and patient chooses bed at      Patient to be transferred to [facility] on 11/07/16.  Patient to be transferred to facility by [facility]     Patient family notified on 11/07/16 of transfer.  Name of family member notified:  Lavera GuiseJerry Krakow patient's son     PHYSICIAN Please sign FL2      Additional Comment:    _______________________________________________ Darleene CleaverAnterhaus, Yariah Selvey R, LCSWA 11/07/2016, 10:17 PM

## 2016-11-07 NOTE — Clinical Social Work Note (Addendum)
CSW provided bed offers for patient and she chose [facility].  CSW was informed that [facility] can accept patient today and insurance has approved patient.  Patient to be d/c'ed today to [facility].  Patient and family agreeable to plans will transport via ems RN to call report [phone].  CSW updated patient's son Dorene SorrowJerry that patient will be discharging to [facility].  Patient's son stated he is aware of where SNF is located at and will go see patient once she arrives.   Windell MouldingEric Shoshannah Faubert, MSW, Theresia MajorsLCSWA [phone]

## 2016-11-07 NOTE — Care Management Important Message (Signed)
Important Message  Patient Details  Name: [name] PatrickDorothy Ghee MRN: [id number] Date of Birth: 1926/05/31   Medicare Important Message Given:  Yes Signed IM notice given    Eber HongGreene, Heraclio Seidman R, RN 11/07/2016, 11:58 AM

## 2016-11-07 NOTE — Progress Notes (Signed)
Physical Therapy Treatment Patient Details Name: [name] [name] MRN: [id number] DOB: 1926-07-29 Today's Date: 11/07/2016    History of Present Illness presented to ER secondary to generalized pain, weakness in bilat LEs; admitted with AKI, UTI.  Of note, recent hospitalization approx 1 week prior to due to LE cellulitis; currently with unna boots in place.    PT Comments    Patient eager for OOB efforts this date; feels stronger and able to tolerate additional activity.  Able to complete all functional activities with less physical assist from therapist (min/mod assist) and RW this date, but fatigues quickly due to significant deconditioning.  No overt buckling, but with progressive bilat knee flexion with progressive mobility (placing patient at continued risk of buckling/fall).  Unable to complete distances required for safe household negotiation.  Continue to recommend STR upon discharge to promote optimal safety/indep with functional activities prior to return home.   Follow Up Recommendations  SNF     Equipment Recommendations  Rolling walker with 5" wheels    Recommendations for Other Services       Precautions / Restrictions Precautions Precautions: Fall Restrictions Weight Bearing Restrictions: No    Mobility  Bed Mobility Overal bed mobility: Needs Assistance Bed Mobility: Supine to Sit     Supine to sit: Supervision     General bed mobility comments: able to negotiate LEs and elevate trunk without physical assist from therapist this date; heavy use of bedrails  Transfers Overall transfer level: Needs assistance Equipment used: Rolling walker (2 wheeled) Transfers: Sit to/from Stand Sit to Stand: Min assist;Mod assist         General transfer comment: improved hand/foot placement; increased time/effort for anterior weight shift (to bring COG over BOS) with each repetition  Ambulation/Gait Ambulation/Gait assistance: Min assist;Mod assist Ambulation  Distance (Feet): 8 Feet Assistive device: Rolling walker (2 wheeled)       General Gait Details: broad BOS with bilat genu varus (L > R); forward flexed posture, mod WBing on RW.  Poor balance requiring hands-on assist at all times.  No spontaneous buckling this date, but with progressive knee flexion as fatigue increased.  Unable to tolerate additional distance due to LE fatigue/weakness.   Stairs            Wheelchair Mobility    Modified Rankin (Stroke Patients Only)       Balance Overall balance assessment: Needs assistance Sitting-balance support: No upper extremity supported;Feet supported Sitting balance-Leahy Scale: Good     Standing balance support: Bilateral upper extremity supported Standing balance-Leahy Scale: Poor                              Cognition Arousal/Alertness: Awake/alert Behavior During Therapy: WFL for tasks assessed/performed Overall Cognitive Status: Within Functional Limits for tasks assessed                                        Exercises Other Exercises Other Exercises: Sit/stand x5 with RW, min/mod assist-prefers to push off of seating surface with R UE; broad BOS, limited bilat knee flexion (requiring increased time/effort/assist to bring COG over BOS) Other Exercises: Rolling bilat, min/mod assist, for management of incontinent bowel; dep for hygiene    General Comments        Pertinent Vitals/Pain Pain Assessment: No/denies pain    Home Living  Prior Function            PT Goals (current goals can now be found in the care plan section) Acute Rehab PT Goals Patient Stated Goal: to get LEs stronger PT Goal Formulation: With patient Time For Goal Achievement: 11/19/16 Potential to Achieve Goals: Good Progress towards PT goals: Progressing toward goals    Frequency    Min 2X/week      PT Plan Current plan remains appropriate    Co-evaluation               AM-PAC PT "6 Clicks" Daily Activity  Outcome Measure  Difficulty turning over in bed (including adjusting bedclothes, sheets and blankets)?: Unable Difficulty moving from lying on back to sitting on the side of the bed? : Unable Difficulty sitting down on and standing up from a chair with arms (e.g., wheelchair, bedside commode, etc,.)?: Unable Help needed moving to and from a bed to chair (including a wheelchair)?: A Lot Help needed walking in hospital room?: A Lot Help needed climbing 3-5 steps with a railing? : Total 6 Click Score: 8    End of Session Equipment Utilized During Treatment: Gait belt Activity Tolerance: Patient tolerated treatment well Patient left: in chair;with call bell/phone within reach;with chair alarm set Nurse Communication: Mobility status PT Visit Diagnosis: Difficulty in walking, not elsewhere classified (R26.2);Muscle weakness (generalized) (M62.81);Unsteadiness on feet (R26.81)     Time: 1453-1510 PT Time Calculation (min) (ACUTE ONLY): 17 min  Charges:  $Gait Training: 8-22 mins                    G Codes:  Functional Assessment Tool Used: AM-PAC 6 Clicks Basic Mobility    Gail Creekmore H. Manson PasseyBrown, PT, DPT, NCS 11/07/16, 3:22 PM [phone]

## 2016-11-08 DIAGNOSIS — R600 Localized edema: Secondary | ICD-10-CM | POA: Diagnosis not present

## 2016-11-08 DIAGNOSIS — R531 Weakness: Secondary | ICD-10-CM | POA: Diagnosis not present

## 2016-11-08 DIAGNOSIS — E860 Dehydration: Secondary | ICD-10-CM | POA: Diagnosis not present

## 2016-11-21 ENCOUNTER — Other Ambulatory Visit: Payer: Self-pay | Admitting: *Deleted

## 2016-11-21 DIAGNOSIS — N179 Acute kidney failure, unspecified: Secondary | ICD-10-CM

## 2016-11-21 NOTE — Patient Outreach (Signed)
[facility]) Care Management  11/21/2016  [name] [name] 07-10-1926 [id number]   Met with [name], SW at facility.  She reports patient discharging today. She feels patient could benefit from a follow up call.  She set up for patient to have home care: SNF, SW, PT  Met with patient at bedside, patient reports she is waiting on family to go home. RNCM discussed [facility] program with patient, Verbal consent obtained for phone calls Packet left for patient to have to give to family.   Plan to refer to [facility] telephonic TOC.  [name]. [name], RN, BSN, [place] [phone]) Business Cell  [phone]) Toll Free Office

## 2016-11-23 ENCOUNTER — Other Ambulatory Visit: Payer: Self-pay

## 2016-11-23 NOTE — Patient Outreach (Signed)
[facility] Care Management  11/23/2016  [name] [name] PatrickDorothy [name] 25-Aug-1926 [id number]   Transition of care  Referral date: 11/22/16 Referral source: status post discharge [facility] on 11/21/16 Insurance: [employer] Attempt #1  Telephone call to patient regarding transition of care assessment. Unable to reach patient or leave voice message.  Message states.." enter remote access code."  PLAN: RNCM will attempt 2nd telephone call to patient within 3 business days.   [name] [name] [name] InaDavina [name] [name] [name] Sporer RN,BSN,CCM [facility] Telephonic  [phone]

## 2016-11-24 ENCOUNTER — Other Ambulatory Visit: Payer: Self-pay

## 2016-11-24 ENCOUNTER — Ambulatory Visit: Payer: Self-pay

## 2016-11-24 ENCOUNTER — Observation Stay
Admission: EM | Admit: 2016-11-24 | Discharge: 2016-11-28 | Disposition: A | Discharge status: Skilled Nursing Facility | Payer: Medicare HMO | Attending: Internal Medicine | Admitting: Internal Medicine

## 2016-11-24 ENCOUNTER — Emergency Department: Payer: Medicare HMO

## 2016-11-24 DIAGNOSIS — D638 Anemia in other chronic diseases classified elsewhere: Secondary | ICD-10-CM | POA: Insufficient documentation

## 2016-11-24 DIAGNOSIS — I890 Lymphedema, not elsewhere classified: Secondary | ICD-10-CM | POA: Insufficient documentation

## 2016-11-24 DIAGNOSIS — Z90710 Acquired absence of both cervix and uterus: Secondary | ICD-10-CM | POA: Diagnosis not present

## 2016-11-24 DIAGNOSIS — Z8249 Family history of ischemic heart disease and other diseases of the circulatory system: Secondary | ICD-10-CM | POA: Diagnosis not present

## 2016-11-24 DIAGNOSIS — Z833 Family history of diabetes mellitus: Secondary | ICD-10-CM | POA: Diagnosis not present

## 2016-11-24 DIAGNOSIS — I872 Venous insufficiency (chronic) (peripheral): Secondary | ICD-10-CM | POA: Insufficient documentation

## 2016-11-24 DIAGNOSIS — R9431 Abnormal electrocardiogram [ECG] [EKG]: Secondary | ICD-10-CM | POA: Diagnosis not present

## 2016-11-24 DIAGNOSIS — Z79899 Other long term (current) drug therapy: Secondary | ICD-10-CM | POA: Diagnosis not present

## 2016-11-24 DIAGNOSIS — S95901A Unspecified injury of unspecified blood vessel at ankle and foot level, right leg, initial encounter: Secondary | ICD-10-CM | POA: Diagnosis not present

## 2016-11-24 DIAGNOSIS — L03116 Cellulitis of left lower limb: Principal | ICD-10-CM | POA: Diagnosis present

## 2016-11-24 DIAGNOSIS — L89159 Pressure ulcer of sacral region, unspecified stage: Secondary | ICD-10-CM | POA: Diagnosis not present

## 2016-11-24 DIAGNOSIS — R0602 Shortness of breath: Secondary | ICD-10-CM | POA: Diagnosis not present

## 2016-11-24 DIAGNOSIS — R609 Edema, unspecified: Secondary | ICD-10-CM | POA: Diagnosis not present

## 2016-11-24 DIAGNOSIS — N179 Acute kidney failure, unspecified: Secondary | ICD-10-CM | POA: Diagnosis present

## 2016-11-24 DIAGNOSIS — Z7982 Long term (current) use of aspirin: Secondary | ICD-10-CM | POA: Diagnosis not present

## 2016-11-24 DIAGNOSIS — M7989 Other specified soft tissue disorders: Secondary | ICD-10-CM | POA: Diagnosis not present

## 2016-11-24 LAB — COMPREHENSIVE METABOLIC PANEL
ALK PHOS: 74 U/L (ref 38–126)
ALT: 20 U/L (ref 14–54)
ANION GAP: 10 (ref 5–15)
AST: 23 U/L (ref 15–41)
Albumin: 3.2 g/dL — ABNORMAL LOW (ref 3.5–5.0)
BILIRUBIN TOTAL: 0.6 mg/dL (ref 0.3–1.2)
BUN: 48 mg/dL — ABNORMAL HIGH (ref 6–20)
CALCIUM: 9.2 mg/dL (ref 8.9–10.3)
CO2: 22 mmol/L (ref 22–32)
Chloride: 106 mmol/L (ref 101–111)
Creatinine, Ser: 1.77 mg/dL — ABNORMAL HIGH (ref 0.44–1.00)
GFR, EST AFRICAN AMERICAN: 28 mL/min — AB (ref >60)
GFR, EST NON AFRICAN AMERICAN: 24 mL/min — AB (ref >60)
Glucose, Bld: 118 mg/dL — ABNORMAL HIGH (ref 65–99)
Potassium: 4.2 mmol/L (ref 3.5–5.1)
Sodium: 138 mmol/L (ref 135–145)
TOTAL PROTEIN: 7.4 g/dL (ref 6.5–8.1)

## 2016-11-24 LAB — CBC WITH DIFFERENTIAL/PLATELET
Basophils Absolute: 0.1 10*3/uL (ref 0–0.1)
Basophils Relative: 1 %
Eosinophils Absolute: 0.6 10*3/uL (ref 0–0.7)
Eosinophils Relative: 11 %
HCT: 28.7 % — ABNORMAL LOW (ref 35.0–47.0)
Hemoglobin: 9 g/dL — ABNORMAL LOW (ref 12.0–16.0)
Lymphocytes Relative: 26 %
Lymphs Abs: 1.3 10*3/uL (ref 1.0–3.6)
MCH: 29.2 pg (ref 26.0–34.0)
MCHC: 31.3 g/dL — ABNORMAL LOW (ref 32.0–36.0)
MCV: 93 fL (ref 80.0–100.0)
Monocytes Absolute: 0.6 10*3/uL (ref 0.2–0.9)
Monocytes Relative: 12 %
Neutro Abs: 2.6 10*3/uL (ref 1.4–6.5)
Neutrophils Relative %: 50 %
Platelets: 369 10*3/uL (ref 150–440)
RBC: 3.08 MIL/uL — ABNORMAL LOW (ref 3.80–5.20)
RDW: 15.2 % — ABNORMAL HIGH (ref 11.5–14.5)
WBC: 5.1 10*3/uL (ref 3.6–11.0)

## 2016-11-24 LAB — LACTIC ACID, PLASMA: Lactic Acid, Venous: 1 mmol/L (ref 0.5–1.9)

## 2016-11-24 LAB — TROPONIN I

## 2016-11-24 NOTE — ED Triage Notes (Signed)
Correction - pt lives at home alone.

## 2016-11-24 NOTE — ED Provider Notes (Signed)
[facility] Emergency Department Provider Note    First MD Initiated Contact with Patient 11/24/16 2204     (approximate)  I have reviewed the triage vital signs and the nursing notes.   HISTORY  Chief Complaint Wound Check    HPI [name] [name] is a 83 y.o. female presents from home with bleeding and pain in the left lower extremity.  Patient does appear very confused and is a poor historian.  Is uncertain as to why she is here but EMS reports the call was for bleeding and patient has bilateral inner boots to the lower extremities for lymphedema where she reportedly tried to cut off herself when she noted bleeding.  Patient was just recently discharged from [facility] back to home with grandson on November 12.  Was supposed to have home health reportedly initiated but no one answered the phone so the never went to check on her.  Patient with emaciated wounds of the left lower extremity and extensive foul-smelling decubitus ulcer.   Past Medical History:  Diagnosis Date  . Cellulitis 10/28/2016   bilateral lower extremities.    Family History  Problem Relation Age of Onset  . Diabetes Mellitus II Mother   . Heart disease Father    Past Surgical History:  Procedure Laterality Date  . ABDOMINAL HYSTERECTOMY    . GANGLION CYST EXCISION     Patient Active Problem List   Diagnosis Date Noted  . AKI (acute kidney injury) (HCC) 11/05/2016  . Pressure injury of skin 11/05/2016  . Elevated troponin 11/04/2016      Prior to Admission medications   Medication Sig Start Date End Date Taking? Authorizing Provider  ascorbic acid (VITAMIN C) 100 MG tablet Take 1 tablet (100 mg total) by mouth daily. 11/07/16   Enedina Finner, MD  Aspirin-Caffeine 400-32 MG TABS Take 2 tablets by mouth daily.    [provider]  ferrous sulfate 325 (65 FE) MG tablet Take 1 tablet (325 mg total) by mouth daily. 11/07/16   Enedina Finner, MD  furosemide (LASIX) 20 MG  tablet Take 1 tablet (20 mg total) by mouth daily as needed for edema. 11/07/16 11/07/17  Enedina Finner, MD  ketoconazole (NIZORAL) 2 % cream Apply 1 application topically daily. 10/13/16 10/13/17  [provider]  Multiple Vitamins-Minerals (MULTIVITAL) tablet Take 1 tablet by mouth daily. 11/07/16   Enedina Finner, MD  potassium chloride (K-DUR) 10 MEQ tablet Take 1 tablet by mouth daily. 09/28/16 09/28/17  [provider]    Allergies Patient has no known allergies.    Social History Social History   Tobacco Use  . Smoking status: Never Smoker  . Smokeless tobacco: Never Used  Substance Use Topics  . Alcohol use: No  . Drug use: No    Review of Systems Patient denies headaches, rhinorrhea, blurry vision, numbness, shortness of breath, chest pain, edema, cough, abdominal pain, nausea, vomiting, diarrhea, dysuria, fevers, rashes or hallucinations unless otherwise stated above in HPI. ____________________________________________   PHYSICAL EXAM:  VITAL SIGNS: Vitals:   11/24/16 2230 11/24/16 2300  BP: (!) 166/64 (!) 149/70  Pulse: 72 78  Resp: (!) 24 (!) 25  Temp:    SpO2: 94% 95%    Constitutional: Alert but confused as to why she is in ER, smells of urine, and foul smelling wounds Eyes: Conjunctivae are normal.  Head: Atraumatic. Nose: No congestion/rhinnorhea. Mouth/Throat: Mucous membranes are moist.   Neck: No stridor. Painless ROM.  Cardiovascular: Normal rate, regular  rhythm. Grossly normal heart sounds.  Respiratory: Normal respiratory effort.  No retractions. Lungs CTAB. Gastrointestinal: obese, Soft and nontender. No distention. No abdominal bruits. No CVA tenderness. Musculoskeletal: Extensive swelling of bilateral lower extremities.  Right lower extremity is in a boot with good distal perfusion.  Left leg with emaciated significant skin tears over the majority of her lower extremity with surrounding erythema and now with blisters.  No appreciable  crepitus but severe pain to palpation.  No active bleeding. Neurologic:  Normal speech No gross focal neurologic deficits are appreciated. No facial droop Skin:  LLE wound as above + stage III decubitus ulcer Psychiatric: encephalopathic  ____________________________________________   LABS (all labs ordered are listed, but only abnormal results are displayed)  Results for orders placed or performed during the hospital encounter of 11/24/16 (from the past 24 hour(s))  Lactic acid, plasma     Status: None   Collection Time: 11/24/16 10:25 PM  Result Value Ref Range   Lactic Acid, Venous 1.0 0.5 - 1.9 mmol/L  Comprehensive metabolic panel     Status: Abnormal   Collection Time: 11/24/16 10:25 PM  Result Value Ref Range   Sodium 138 135 - 145 mmol/L   Potassium 4.2 3.5 - 5.1 mmol/L   Chloride 106 101 - 111 mmol/L   CO2 22 22 - 32 mmol/L   Glucose, Bld 118 (H) 65 - 99 mg/dL   BUN 48 (H) 6 - 20 mg/dL   Creatinine, Ser 1.611.77 (H) 0.44 - 1.00 mg/dL   Calcium 9.2 8.9 - 09.610.3 mg/dL   Total Protein 7.4 6.5 - 8.1 g/dL   Albumin 3.2 (L) 3.5 - 5.0 g/dL   AST 23 15 - 41 U/L   ALT 20 14 - 54 U/L   Alkaline Phosphatase 74 38 - 126 U/L   Total Bilirubin 0.6 0.3 - 1.2 mg/dL   GFR calc non Af Amer 24 (L) >60 mL/min   GFR calc Af Amer 28 (L) >60 mL/min   Anion gap 10 5 - 15  Troponin I     Status: None   Collection Time: 11/24/16 10:25 PM  Result Value Ref Range   Troponin I <0.03 <0.03 ng/mL  CBC WITH DIFFERENTIAL     Status: Abnormal   Collection Time: 11/24/16 10:25 PM  Result Value Ref Range   WBC 5.1 3.6 - 11.0 K/uL   RBC 3.08 (L) 3.80 - 5.20 MIL/uL   Hemoglobin 9.0 (L) 12.0 - 16.0 g/dL   HCT 04.528.7 (L) 40.935.0 - 81.147.0 %   MCV 93.0 80.0 - 100.0 fL   MCH 29.2 26.0 - 34.0 pg   MCHC 31.3 (L) 32.0 - 36.0 g/dL   RDW 91.415.2 (H) 78.211.5 - 95.614.5 %   Platelets 369 150 - 440 K/uL   Neutrophils Relative % 50 %   Neutro Abs 2.6 1.4 - 6.5 K/uL   Lymphocytes Relative 26 %   Lymphs Abs 1.3 1.0 - 3.6 K/uL    Monocytes Relative 12 %   Monocytes Absolute 0.6 0.2 - 0.9 K/uL   Eosinophils Relative 11 %   Eosinophils Absolute 0.6 0 - 0.7 K/uL   Basophils Relative 1 %   Basophils Absolute 0.1 0 - 0.1 K/uL   ____________________________________________  EKG My review and personal interpretation at Time: 22:11   Indication: ams  Rate: 75  Rhythm: sinus Axis: normal Other: no stemi, normal intervals ____________________________________________  RADIOLOGY  I personally reviewed all radiographic images ordered to evaluate for the above acute complaints  and reviewed radiology reports and findings.  These findings were personally discussed with the patient.  Please see medical record for radiology report.  ____________________________________________   PROCEDURES  Procedure(s) performed:  Procedures    Critical Care performed: no ____________________________________________   INITIAL IMPRESSION / ASSESSMENT AND PLAN / ED COURSE  Pertinent labs & imaging results that were available during my care of the patient were reviewed by me and considered in my medical decision making (see chart for details).  DDX: Cellulitis, necrotizing soft tissue infection, stress, burn, laceration, abrasion, allergic reaction, lymphedema  [name] PatrickDorothy Armentor is a 83 y.o. who presents to the ED with significant May stating and foul-smelling decubitus ulcer as well as lower extremity cellulitis and erythema with blistering.  Blood work sent for the above differential.  Patient does not meet sepsis criteria but the wound does have significant erythroderma therefore will start on IV vancomycin.  No leukocytosis but may be secondary to immunosuppression secondary to antibiotics with doxycycline.  Discussed with hospitalist for admission.      ____________________________________________   FINAL CLINICAL IMPRESSION(S) / ED DIAGNOSES  Final diagnoses:  Cellulitis of left lower extremity      NEW MEDICATIONS  STARTED DURING THIS VISIT:  This SmartLink is deprecated. Use AVSMEDLIST instead to display the medication list for a patient.   Note:  This document was prepared using Dragon voice recognition software and may include unintentional dictation errors.    Willy Eddyobinson, Wyndi Northrup, MD 11/25/16 [phone]

## 2016-11-24 NOTE — ED Triage Notes (Signed)
Wound bleeding - had leg dressing on and cut it off herself and caused the leg to bleed. Both legs have una type dressing, decub on buttocks with old dressing - foul odor and drainage to decub

## 2016-11-24 NOTE — ED Notes (Signed)
Pt discharged from [facility] to her home with grandson on 11/12

## 2016-11-25 ENCOUNTER — Emergency Department: Payer: Medicare HMO

## 2016-11-25 ENCOUNTER — Other Ambulatory Visit: Payer: Self-pay

## 2016-11-25 DIAGNOSIS — R600 Localized edema: Secondary | ICD-10-CM | POA: Diagnosis not present

## 2016-11-25 DIAGNOSIS — L03116 Cellulitis of left lower limb: Secondary | ICD-10-CM | POA: Diagnosis not present

## 2016-11-25 DIAGNOSIS — N179 Acute kidney failure, unspecified: Secondary | ICD-10-CM | POA: Diagnosis not present

## 2016-11-25 LAB — CBC
HCT: 25.8 % — ABNORMAL LOW (ref 35.0–47.0)
Hemoglobin: 8.2 g/dL — ABNORMAL LOW (ref 12.0–16.0)
MCH: 29.1 pg (ref 26.0–34.0)
MCHC: 31.7 g/dL — AB (ref 32.0–36.0)
MCV: 91.8 fL (ref 80.0–100.0)
PLATELETS: 353 10*3/uL (ref 150–440)
RBC: 2.81 MIL/uL — ABNORMAL LOW (ref 3.80–5.20)
RDW: 15 % — AB (ref 11.5–14.5)
WBC: 4.4 10*3/uL (ref 3.6–11.0)

## 2016-11-25 LAB — BASIC METABOLIC PANEL
Anion gap: 8 (ref 5–15)
BUN: 30 mg/dL — ABNORMAL HIGH (ref 6–20)
CHLORIDE: 108 mmol/L (ref 101–111)
CO2: 22 mmol/L (ref 22–32)
CREATININE: 1.07 mg/dL — AB (ref 0.44–1.00)
Calcium: 8.5 mg/dL — ABNORMAL LOW (ref 8.9–10.3)
GFR calc Af Amer: 52 mL/min — ABNORMAL LOW (ref >60)
GFR, EST NON AFRICAN AMERICAN: 45 mL/min — AB (ref >60)
GLUCOSE: 119 mg/dL — AB (ref 65–99)
Potassium: 4.2 mmol/L (ref 3.5–5.1)
SODIUM: 138 mmol/L (ref 135–145)

## 2016-11-25 LAB — URINALYSIS, COMPLETE (UACMP) WITH MICROSCOPIC
BILIRUBIN URINE: NEGATIVE
GLUCOSE, UA: NEGATIVE mg/dL
Hgb urine dipstick: NEGATIVE
KETONES UR: NEGATIVE mg/dL
NITRITE: POSITIVE — AB
PROTEIN: NEGATIVE mg/dL
Specific Gravity, Urine: 1.015 (ref 1.005–1.030)
pH: 5 (ref 5.0–8.0)

## 2016-11-25 LAB — LACTIC ACID, PLASMA
LACTIC ACID, VENOUS: 2.2 mmol/L — AB (ref 0.5–1.9)
LACTIC ACID, VENOUS: 2 mmol/L — AB (ref 0.5–1.9)

## 2016-11-25 MED ORDER — COLLAGENASE 250 UNIT/GM EX OINT
TOPICAL_OINTMENT | Freq: Every day | CUTANEOUS | Status: DC
  Administered 2016-11-25 – 2016-11-28 (×4): via TOPICAL
  Filled 2016-11-25: qty 30

## 2016-11-25 MED ORDER — ACETAMINOPHEN 325 MG PO TABS
650.0000 mg | ORAL_TABLET | Freq: Four times a day (QID) | ORAL | Status: DC | PRN
Start: 2016-11-25 — End: 2016-11-28

## 2016-11-25 MED ORDER — ONDANSETRON HCL 4 MG PO TABS: 4.0000 mg | ORAL_TABLET | Freq: Four times a day (QID) | ORAL | Status: DC | PRN

## 2016-11-25 MED ORDER — ACETAMINOPHEN 650 MG RE SUPP: 650.0000 mg | Freq: Four times a day (QID) | RECTAL | Status: DC | PRN

## 2016-11-25 MED ORDER — OXYCODONE HCL 5 MG PO TABS: 5.0000 mg | ORAL_TABLET | ORAL | Status: DC | PRN

## 2016-11-25 MED ORDER — VANCOMYCIN HCL 10 G IV SOLR
1250.0000 mg | Freq: Once | INTRAVENOUS | Status: AC
  Administered 2016-11-25: 1250 mg via INTRAVENOUS
  Filled 2016-11-25: qty 1250

## 2016-11-25 MED ORDER — HEPARIN SODIUM (PORCINE) 5000 UNIT/ML IJ SOLN
5000.0000 [IU] | Freq: Three times a day (TID) | INTRAMUSCULAR | Status: DC
  Administered 2016-11-25 – 2016-11-28 (×11): 5000 [IU] via SUBCUTANEOUS
  Filled 2016-11-25 (×11): qty 1

## 2016-11-25 MED ORDER — ONDANSETRON HCL 4 MG/2ML IJ SOLN: 4.0000 mg | Freq: Four times a day (QID) | INTRAMUSCULAR | Status: DC | PRN

## 2016-11-25 MED ORDER — SODIUM CHLORIDE 0.9 % IV SOLN
INTRAVENOUS | Status: AC
  Administered 2016-11-25: 03:00:00 via INTRAVENOUS

## 2016-11-25 MED ORDER — SODIUM CHLORIDE 0.9 % IV SOLN
INTRAVENOUS | Status: AC
  Administered 2016-11-25 (×2): via INTRAVENOUS

## 2016-11-25 MED ORDER — VANCOMYCIN HCL 10 G IV SOLR
1250.0000 mg | INTRAVENOUS | Status: DC
  Administered 2016-11-25: 1250 mg via INTRAVENOUS
  Filled 2016-11-25: qty 1250

## 2016-11-25 NOTE — Progress Notes (Signed)
[facility] [place] [facility]   PATIENT NAME: [name] [name]    MR#:  [id number]  DATE OF BIRTH:  Feb 20, 1926  SUBJECTIVE:  CHIEF COMPLAINT:   Chief Complaint  Patient presents with  . Wound Check  no complaints, REVIEW OF SYSTEMS:  Review of Systems  Constitutional: Negative for chills, fever and weight loss.  HENT: Negative for nosebleeds and sore throat.   Eyes: Negative for blurred vision.  Respiratory: Negative for cough, shortness of breath and wheezing.   Cardiovascular: Negative for chest pain, orthopnea, leg swelling and PND.  Gastrointestinal: Negative for abdominal pain, constipation, diarrhea, heartburn, nausea and vomiting.  Genitourinary: Negative for dysuria and urgency.  Musculoskeletal: Negative for back pain.  Skin: Positive for rash.  Neurological: Negative for dizziness, speech change, focal weakness and headaches.  Endo/Heme/Allergies: Does not bruise/bleed easily.  Psychiatric/Behavioral: Negative for depression.    DRUG ALLERGIES:  No Known Allergies VITALS:  Blood pressure 124/63, pulse 71, temperature 98.4 F (36.9 C), temperature source Oral, resp. rate 20, height 5\' 7"  (1.702 m), weight 98.6 kg (217 lb 4.8 oz), SpO2 99 %. PHYSICAL EXAMINATION:  Physical Exam  Constitutional: She is oriented to person, place, and time and well-developed, well-nourished, and in no distress.  HENT:  Head: Normocephalic and atraumatic.  Eyes: Conjunctivae and EOM are normal. Pupils are equal, round, and reactive to light.  Neck: Normal range of motion. Neck supple. No tracheal deviation present. No thyromegaly present.  Cardiovascular: Normal rate, regular rhythm and normal heart sounds.  Pulmonary/Chest: Effort normal and breath sounds normal. No respiratory distress. She has no wheezes. She exhibits no tenderness.  Abdominal: Soft. Bowel sounds are normal. She exhibits no distension. There is no tenderness.  Musculoskeletal: Normal range  of motion.  Neurological: She is alert and oriented to person, place, and time. No cranial nerve deficit.  Skin: Skin is warm and dry. Rash noted. There is erythema.  Dressing in place left leg    Psychiatric: Mood and affect normal.   LABORATORY PANEL:  Female CBC Recent Labs  Lab 11/25/16 1505  WBC 4.4  HGB 8.2*  HCT 25.8*  PLT 353   ------------------------------------------------------------------------------------------------------------------ Chemistries  Recent Labs  Lab 11/24/16 2225 11/25/16 1505  NA 138 138  K 4.2 4.2  CL 106 108  CO2 22 22  GLUCOSE 118* 119*  BUN 48* 30*  CREATININE 1.77* 1.07*  CALCIUM 9.2 8.5*  AST 23  --   ALT 20  --   ALKPHOS 74  --   BILITOT 0.6  --    RADIOLOGY:  Dg Chest 1 View  Result Date: 11/25/2016 CLINICAL DATA:  Acute onset of bleeding from leg wound. Shortness of breath. EXAM: CHEST 1 VIEW COMPARISON:  Chest radiograph performed 11/04/2016 FINDINGS: The lungs are well-aerated. Mild peribronchial thickening is noted. There is no evidence of focal opacification, pleural effusion or pneumothorax. The cardiomediastinal silhouette is within normal limits. No acute osseous abnormalities are seen. IMPRESSION: Mild peribronchial thickening noted.  Lungs otherwise clear. Electronically Signed   By: Roanna RaiderJeffery  Chang M.D.   On: 11/25/2016 00:15   Dg Tibia/fibula Left  Result Date: 11/25/2016 CLINICAL DATA:  Wound bleeding at the left leg, acute onset. EXAM: LEFT TIBIA AND FIBULA - 2 VIEW COMPARISON:  None. FINDINGS: The known soft tissue wound is not well characterized on radiograph. Diffuse soft tissue swelling is noted. No radiopaque foreign bodies are seen. There is degenerative change at the left knee, with chronic depression of the  medial tibial plateau and bony remodeling. The ankle mortise is incompletely assessed, but appears grossly unremarkable. There is no evidence of fracture or dislocation. IMPRESSION: Known soft tissue wound is  not well characterized on radiograph. No radiopaque foreign bodies seen. Electronically Signed   By: Roanna RaiderJeffery  Chang M.D.   On: 11/25/2016 00:18   [place] Venous Img Lower Unilateral Left  Result Date: 11/25/2016 CLINICAL DATA:  83 year old female with left lower extremity swelling and pain. EXAM: Left LOWER EXTREMITY VENOUS DOPPLER ULTRASOUND TECHNIQUE: Gray-scale sonography with graded compression, as well as color Doppler and duplex ultrasound were performed to evaluate the lower extremity deep venous systems from the level of the common femoral vein and including the common femoral, femoral, profunda femoral, popliteal and calf veins including the posterior tibial, peroneal and gastrocnemius veins when visible. The superficial great saphenous vein was also interrogated. Spectral Doppler was utilized to evaluate flow at rest and with distal augmentation maneuvers in the common femoral, femoral and popliteal veins. COMPARISON:  Left lower extremity radiograph dated 11/24/2016 FINDINGS: Contralateral Common Femoral Vein: Respiratory phasicity is normal and symmetric with the symptomatic side. No evidence of thrombus. Normal compressibility. Common Femoral Vein: No evidence of thrombus. Normal compressibility, respiratory phasicity and response to augmentation. Saphenofemoral Junction: No evidence of thrombus. Normal compressibility and flow on color Doppler imaging. Profunda Femoral Vein: No evidence of thrombus. Normal compressibility and flow on color Doppler imaging. Femoral Vein: No evidence of thrombus. Normal compressibility, respiratory phasicity and response to augmentation. Popliteal Vein: No evidence of thrombus. Normal compressibility, respiratory phasicity and response to augmentation. Calf Veins: The calf veins are not visualized. Superficial Great Saphenous Vein: No evidence of thrombus. Normal compressibility. Venous Reflux:  None. Other Findings:  Diffuse subcutaneous edema. IMPRESSION: No evidence  of deep venous thrombosis in the visualized left lower extremity veins. Electronically Signed   By: Elgie CollardArash  Radparvar M.D.   On: 11/25/2016 01:49   ASSESSMENT AND PLAN:  83 y.o. female who presents with left leg pain.  Patient was recently admitted here and treated for cellulitis.  She has been on doxycycline for the same.  She had Unna boots placed on her legs at some point recently, and states that her legs started hurting significantly.  Left leg Unna boot was removed and she had skin tears and new wounds as well as cellulitis.    * Left leg cellulitis -IV antibiotics, wound team following - per their eval patient has  Left anterior lower leg, peeling epithelium:  19 cm x 12 cm x 0.2 cm Left medial lower leg above malleolus:  2.5 cm x 2 cm x 0.2 cm  Left anterior lower leg just above foot:  5 cm x 3 cm x 0.2 cm  Wound ZOX:WRUEAbed:Ruddy red and friable  * Pressure ulcer: on ischium present on admission, dressing changes per wound care nurse  * AKI (acute kidney injury) (HCC) -continue IV fluids, avoid nephrotoxins and monitor for improvement - Creat improved from 1.7-> 1.0  * Anemia of chronic dz: monitor, no active bleed. - Hb 8.2     All the records are reviewed and case discussed with Care Management/Social Worker. Management plans discussed with the patient, nursing and they are in agreement.  CODE STATUS: Full Code  TOTAL TIME TAKING CARE OF THIS PATIENT: 25 minutes.   More than 50% of the time was spent in counseling/coordination of care: YES  POSSIBLE D/C IN 1-2 DAYS, DEPENDING ON CLINICAL CONDITION.   Delfino LovettVipul Arionne Iams M.D on 11/25/2016 at  6:53 PM  Between 7am to 6pm - Pager - [phone]  After 6pm go to [email or web address] - Social research officer, government  [facility]  Office  [phone]  CC: Primary care physician; System, Pcp Not In  Note: This dictation was prepared with Dragon dictation along with smaller phrase technology. Any transcriptional errors that  result from this process are unintentional.

## 2016-11-25 NOTE — ED Notes (Signed)
Patient transported to Ultrasound 

## 2016-11-25 NOTE — H&P (Signed)
[facility] - [place] at [facility]   PATIENT NAME: [name] [name]    MR#:  [id number]  DATE OF BIRTH:  23-May-1926  DATE OF ADMISSION:  11/24/2016  PRIMARY CARE PHYSICIAN: System, Pcp Not In   REQUESTING/REFERRING PHYSICIAN: Roxan Hockeyobinson, MD  CHIEF COMPLAINT:   Chief Complaint  Patient presents with  . Wound Check    HISTORY OF PRESENT ILLNESS:  [name] [name]  is a 83 y.o. female who presents with left leg pain.  Patient was recently admitted here and treated for cellulitis.  She has been on doxycycline for the same.  She had Unna boots placed on her legs at some point recently, and states that her legs started hurting significantly.  Left leg Unna boot was removed and she had skin tears and new wounds as well as cellulitis.  Hospitalist were called for admission and further treatment  PAST MEDICAL HISTORY:   Past Medical History:  Diagnosis Date  . Cellulitis 10/28/2016   bilateral lower extremities.     PAST SURGICAL HISTORY:   Past Surgical History:  Procedure Laterality Date  . ABDOMINAL HYSTERECTOMY    . GANGLION CYST EXCISION      SOCIAL HISTORY:   Social History   Tobacco Use  . Smoking status: Never Smoker  . Smokeless tobacco: Never Used  Substance Use Topics  . Alcohol use: No    FAMILY HISTORY:   Family History  Problem Relation Age of Onset  . Diabetes Mellitus II Mother   . Heart disease Father     DRUG ALLERGIES:  No Known Allergies  MEDICATIONS AT HOME:   Prior to Admission medications   Medication Sig Start Date End Date Taking? Authorizing Provider  ascorbic acid (VITAMIN C) 100 MG tablet Take 1 tablet (100 mg total) by mouth daily. 11/07/16   Enedina FinnerPatel, Sona, MD  Aspirin-Caffeine 400-32 MG TABS Take 2 tablets by mouth daily.    [provider]  ferrous sulfate 325 (65 FE) MG tablet Take 1 tablet (325 mg total) by mouth daily. 11/07/16   Enedina FinnerPatel, Sona, MD  furosemide (LASIX) 20 MG tablet Take 1 tablet (20  mg total) by mouth daily as needed for edema. 11/07/16 11/07/17  Enedina FinnerPatel, Sona, MD  ketoconazole (NIZORAL) 2 % cream Apply 1 application topically daily. 10/13/16 10/13/17  [provider]  Multiple Vitamins-Minerals (MULTIVITAL) tablet Take 1 tablet by mouth daily. 11/07/16   Enedina FinnerPatel, Sona, MD  potassium chloride (K-DUR) 10 MEQ tablet Take 1 tablet by mouth daily. 09/28/16 09/28/17  [provider]    REVIEW OF SYSTEMS:  Review of Systems  Constitutional: Negative for chills, fever, malaise/fatigue and weight loss.  HENT: Negative for ear pain, hearing loss and tinnitus.   Eyes: Negative for blurred vision, double vision, pain and redness.  Respiratory: Negative for cough, hemoptysis and shortness of breath.   Cardiovascular: Positive for leg swelling. Negative for chest pain, palpitations and orthopnea.  Gastrointestinal: Negative for abdominal pain, constipation, diarrhea, nausea and vomiting.  Genitourinary: Negative for dysuria, frequency and hematuria.  Musculoskeletal: Negative for back pain, joint pain and neck pain.       Left leg tenderness  Skin:       No acne, rash, or lesions  Neurological: Negative for dizziness, tremors, focal weakness and weakness.  Endo/Heme/Allergies: Negative for polydipsia. Does not bruise/bleed easily.  Psychiatric/Behavioral: Negative for depression. The patient is not nervous/anxious and does not have insomnia.      VITAL SIGNS:   Vitals:  11/24/16 2215 11/24/16 2230 11/24/16 2300 11/25/16 0000  BP: (!) 162/72 (!) 166/64 (!) 149/70 (!) 157/66  Pulse: 72 72 78 76  Resp: (!) 26 (!) 24 (!) 25   Temp:      TempSrc:      SpO2: 98% 94% 95% 99%  Weight:      Height:       Wt Readings from Last 3 Encounters:  11/24/16 102.5 kg (226 lb)  11/07/16 105 kg (231 lb 6.4 oz)    PHYSICAL EXAMINATION:  Physical Exam  Vitals reviewed. Constitutional: She is oriented to person, place, and time. She appears well-developed and well-nourished.  No distress.  HENT:  Head: Normocephalic and atraumatic.  Mouth/Throat: Oropharynx is clear and moist.  Eyes: Conjunctivae and EOM are normal. Pupils are equal, round, and reactive to light. No scleral icterus.  Neck: Normal range of motion. Neck supple. No JVD present. No thyromegaly present.  Cardiovascular: Normal rate, regular rhythm and intact distal pulses. Exam reveals no gallop and no friction rub.  No murmur heard. Respiratory: Effort normal and breath sounds normal. No respiratory distress. She has no wheezes. She has no rales.  GI: Soft. Bowel sounds are normal. She exhibits no distension. There is no tenderness.  Musculoskeletal: Normal range of motion. She exhibits no edema.  No arthritis, no gout  Lymphadenopathy:    She has no cervical adenopathy.  Neurological: She is alert and oriented to person, place, and time. No cranial nerve deficit.  No dysarthria, no aphasia  Skin: Skin is warm and dry. No rash noted. There is erythema (With significant skin tear wounds on the left leg).  Psychiatric: She has a normal mood and affect. Her behavior is normal. Judgment and thought content normal.    LABORATORY PANEL:   CBC Recent Labs  Lab 11/24/16 2225  WBC 5.1  HGB 9.0*  HCT 28.7*  PLT 369   ------------------------------------------------------------------------------------------------------------------  Chemistries  Recent Labs  Lab 11/24/16 2225  NA 138  K 4.2  CL 106  CO2 22  GLUCOSE 118*  BUN 48*  CREATININE 1.77*  CALCIUM 9.2  AST 23  ALT 20  ALKPHOS 74  BILITOT 0.6   ------------------------------------------------------------------------------------------------------------------  Cardiac Enzymes Recent Labs  Lab 11/24/16 2225  TROPONINI <0.03   ------------------------------------------------------------------------------------------------------------------  RADIOLOGY:  Dg Chest 1 View  Result Date: 11/25/2016 CLINICAL DATA:  Acute onset  of bleeding from leg wound. Shortness of breath. EXAM: CHEST 1 VIEW COMPARISON:  Chest radiograph performed 11/04/2016 FINDINGS: The lungs are well-aerated. Mild peribronchial thickening is noted. There is no evidence of focal opacification, pleural effusion or pneumothorax. The cardiomediastinal silhouette is within normal limits. No acute osseous abnormalities are seen. IMPRESSION: Mild peribronchial thickening noted.  Lungs otherwise clear. Electronically Signed   By: Roanna RaiderJeffery  Chang M.D.   On: 11/25/2016 00:15   Dg Tibia/fibula Left  Result Date: 11/25/2016 CLINICAL DATA:  Wound bleeding at the left leg, acute onset. EXAM: LEFT TIBIA AND FIBULA - 2 VIEW COMPARISON:  None. FINDINGS: The known soft tissue wound is not well characterized on radiograph. Diffuse soft tissue swelling is noted. No radiopaque foreign bodies are seen. There is degenerative change at the left knee, with chronic depression of the medial tibial plateau and bony remodeling. The ankle mortise is incompletely assessed, but appears grossly unremarkable. There is no evidence of fracture or dislocation. IMPRESSION: Known soft tissue wound is not well characterized on radiograph. No radiopaque foreign bodies seen. Electronically Signed   By: Roanna RaiderJeffery  Chang  M.D.   On: 11/25/2016 00:18    EKG:   Orders placed or performed during the hospital encounter of 11/24/16  . EKG 12-Lead  . EKG 12-Lead    IMPRESSION AND PLAN:  Principal Problem:   Left leg cellulitis -IV antibiotics, wound team consult Active Problems:   AKI (acute kidney injury) (HCC) -gentle IV fluids, avoid nephrotoxins and monitor for improvement  All the records are reviewed and case discussed with ED provider. Management plans discussed with the patient and/or family.  DVT PROPHYLAXIS: SubQ heparin  GI PROPHYLAXIS: None  ADMISSION STATUS: Observation  CODE STATUS: Full Code Status History    Date Active Date Inactive Code Status Order ID Comments User  Context   11/05/2016 01:04 11/07/2016 22:37 Full Code [id number]  Arnaldo Natal, MD ED      TOTAL TIME TAKING CARE OF THIS PATIENT: 40 minutes.   Arlester Keehan FIELDING 11/25/2016, 12:35 AM  [facility]  Office  [phone]  CC: Primary care physician; System, Pcp Not In  Note:  This document was prepared using Dragon voice recognition software and may include unintentional dictation errors.

## 2016-11-25 NOTE — Care Management (Signed)
Patient admitted from home with cellulitis.  Patient discharged [facility] on 11/12.  It was reported that patient was set up with home health at discharge. Per patient home health was never set up.  Patient lives at home alone.  Grandson available for support, and provides transportation.  Unable to reach grandson for further information.  Patient states that she has a RW and WC in the home. PT consult pending. Patient requiring dressing changes.  If home health services are appropriate at discharge, patient states that she does not have a home health agency preference. RNCN following

## 2016-11-25 NOTE — Care Management Obs Status (Signed)
MEDICARE OBSERVATION STATUS NOTIFICATION   Patient Details  Name: [name] [name] MRN: [id number] Date of Birth: June 17, 1926   Medicare Observation Status Notification Given:  Yes    Chapman FitchBOWEN, Inara Dike T, RN 11/25/2016, 4:39 PM

## 2016-11-25 NOTE — Clinical Social Work Note (Signed)
Patient recently discharged from [facility] and went home. Patient came back to hospital and was smelling of urine. This was the CSW consult that was placed. CSW attempted to reach patient's son: [name]: [phone] but had to leave a message.  York SpanielMonica Watson Robarge MSW,LCSW [phone]

## 2016-11-25 NOTE — ED Notes (Signed)
Pt's right leg unwrapped per MD request. Leg swollen and pink around the ankle. No open wound observed.

## 2016-11-25 NOTE — Progress Notes (Signed)
Pharmacy Antibiotic Note  [name] PatrickDorothy [name] is a 83 y.o. female admitted on 11/24/2016 with cellulitis.  Pharmacy has been consulted for vancomycin dosing.  Plan: DW 81kg  Vd 51L kei 0.027 hr-1  T1/2 26 hours Vancomycin 1250 mg q 36 hours ordered with stacked dosing. Level before 4th dose. Goal trough 15-20  Height: 5\' 7"  (170.2 cm) Weight: 226 lb (102.5 kg) IBW/kg (Calculated) : 61.6  Temp (24hrs), Avg:98.1 F (36.7 C), Min:98.1 F (36.7 C), Max:98.1 F (36.7 C)  Recent Labs  Lab 11/24/16 2225 11/24/16 2356  WBC 5.1  --   CREATININE 1.77*  --   LATICACIDVEN 1.0 2.0*    Estimated Creatinine Clearance: 27.1 mL/min (A) (by C-G formula based on SCr of 1.77 mg/dL (H)).    No Known Allergies  Antimicrobials this admission: Vancomycin 11/16  >>    >>   Dose adjustments this admission:   Microbiology results: 11/16 BCx: pending      11/15 UA: pending 11/16 CXR: Mild peribronchial thickening noted.  Lungs otherwise clear.  Thank you for allowing pharmacy to be a part of this patient's care.  Lorann Tani S 11/25/2016 1:28 AM

## 2016-11-25 NOTE — Consult Note (Addendum)
WOC Nurse wound consult note Reason for Consult:Wounds to left lower leg, present on admission Wound type:Venous insufficiency.  Had an [employer]NA boot on for extended period she reports.  HH was ordered and never started. Unstageable pressure injury to left ischium, present on admission.   Pressure Injury POA: Yes to ischium Measurement: Left anterior lower leg, peeling epithelium:  19 cm x 12 cm x 0.2 cm Left medial lower leg above malleolus:  2.5 cm x 2 cm x 0.2 cm  Left anterior lower leg just above foot:  5 cm x 3 cm x 0.2 cm  Wound WUJ:WJXBJbed:Ruddy red and friable Drainage (amount, consistency, odor) Moderate serosanguinous weeping Periwound:Chronic skin changes, minimal edema to bilateral lower legs Patient feels the Unna boots did not help her and does not want compression at this time.  Dressing procedure/placement/frequency:Silicone contact layer to wound bed.  Cover with Aquacel ag for topical antimicrobial coverage.  Secure with 4x4 gauze and kerlix/tape.  Change Mon/wed/Fri Daily Santyl dressing to left ischium *Will need HH coverage for wound care upon discharge* Will not follow at this time.  Please re-consult if needed.  Maple HudsonKaren Desani Sprung RN BSN CWON Pager [phone]

## 2016-11-25 NOTE — Progress Notes (Signed)
Chaplain rounding unit visited with pt. Pt was lying in the bed at the time of this visit. Pt states she is feeling much better today than she did yesterday. Pt [facility] for visiting her and asked CH to pray for her. CH is available to follow up pt as needed.     11/25/16 1500  Clinical Encounter Type  Visited With Patient  Visit Type Initial;Other (Comment)  Referral From Chaplain  Consult/Referral To Chaplain  Spiritual Encounters  Spiritual Needs Other (Comment)

## 2016-11-25 NOTE — Progress Notes (Signed)
Reported critical lactic acid level- 2.2 to Dr. Sherryll BurgerShah. Order to increase normal saline rate to 100

## 2016-11-25 NOTE — Patient Outreach (Addendum)
[facility] Care Management  11/25/2016  [name] PatrickDorothy [name] [birth year] [id number]   Transition of care  Referral date: 11/22/16 Referral source: status post [facility] 11[facility]16 Insurance: [employer] Attempt #2  Telephone call to patient regarding transition of care assessment. Unable to reach patient or leave voice message.  Message states.." enter remote access code."  ASSESSMENT: Upon chart review [facility].    PLAN: RNCM will notify [facility] hospital liaisons of patients admission.  RNCM will attempt 3rd telephone call to patient within 3 business days.   [name] [name] Karagan Lehr RN,BSN,CCM [facility] Telephonic  [phone]

## 2016-11-26 DIAGNOSIS — L03116 Cellulitis of left lower limb: Secondary | ICD-10-CM | POA: Diagnosis not present

## 2016-11-26 DIAGNOSIS — N179 Acute kidney failure, unspecified: Secondary | ICD-10-CM | POA: Diagnosis not present

## 2016-11-26 LAB — BASIC METABOLIC PANEL
Anion gap: 4 — ABNORMAL LOW (ref 5–15)
BUN: 22 mg/dL — ABNORMAL HIGH (ref 6–20)
CALCIUM: 8.3 mg/dL — AB (ref 8.9–10.3)
CO2: 23 mmol/L (ref 22–32)
CREATININE: 0.76 mg/dL (ref 0.44–1.00)
Chloride: 113 mmol/L — ABNORMAL HIGH (ref 101–111)
GLUCOSE: 97 mg/dL (ref 65–99)
Potassium: 4.2 mmol/L (ref 3.5–5.1)
Sodium: 140 mmol/L (ref 135–145)

## 2016-11-26 LAB — CBC
HEMATOCRIT: 25.6 % — AB (ref 35.0–47.0)
Hemoglobin: 8.2 g/dL — ABNORMAL LOW (ref 12.0–16.0)
MCH: 29.4 pg (ref 26.0–34.0)
MCHC: 31.9 g/dL — AB (ref 32.0–36.0)
MCV: 92.2 fL (ref 80.0–100.0)
PLATELETS: 325 10*3/uL (ref 150–440)
RBC: 2.78 MIL/uL — ABNORMAL LOW (ref 3.80–5.20)
RDW: 15.4 % — AB (ref 11.5–14.5)
WBC: 3.9 10*3/uL (ref 3.6–11.0)

## 2016-11-26 LAB — LACTIC ACID, PLASMA
Lactic Acid, Venous: 0.8 mmol/L (ref 0.5–1.9)
Lactic Acid, Venous: 0.7 mmol/L (ref 0.5–1.9)

## 2016-11-26 MED ORDER — ASPIRIN EC 81 MG PO TBEC
81.0000 mg | DELAYED_RELEASE_TABLET | Freq: Every day | ORAL | Status: DC
  Administered 2016-11-27 – 2016-11-28 (×2): 81 mg via ORAL
  Filled 2016-11-26 (×2): qty 1

## 2016-11-26 MED ORDER — VANCOMYCIN HCL 10 G IV SOLR
1250.0000 mg | INTRAVENOUS | Status: DC
  Administered 2016-11-26 – 2016-11-27 (×2): 1250 mg via INTRAVENOUS
  Filled 2016-11-26 (×3): qty 1250

## 2016-11-26 NOTE — [place] FL2 (Signed)
  [place] MEDICAID FL2 LEVEL OF CARE SCREENING TOOL     IDENTIFICATION  Patient Name: [name] [name] Birthdate: 1926-09-24 Sex: female Admission Date (Current Location): 11/24/2016  [place]   Facility and Address:  [facility], [address], [place], [place] [address]      Provider Number: [id number]  Attending Physician Name and Address:  Ramonita LabGouru, Nicolemarie Wooley, MD  Relative Name and Phone Number:       Current Level of Care: Hospital Recommended Level of Care: Skilled Nursing Facility Prior Approval Number:    Date Approved/Denied:   PASRR Number: ([id number])  Discharge Plan: SNF    Current Diagnoses: Patient Active Problem List   Diagnosis Date Noted  . Left leg cellulitis 11/25/2016  . AKI (acute kidney injury) (HCC) 11/05/2016  . Pressure injury of skin 11/05/2016  . Elevated troponin 11/04/2016    Orientation RESPIRATION BLADDER Height & Weight     Self, Time, Situation, Place  Normal Continent Weight: 217 lb 4.8 oz (98.6 kg) Height:  5\' 7"  (170.2 cm)  BEHAVIORAL SYMPTOMS/MOOD NEUROLOGICAL BOWEL NUTRITION STATUS      Continent Diet(Diet: Heart Healthy )  AMBULATORY STATUS COMMUNICATION OF NEEDS Skin   Extensive Assist Verbally PU Stage and Appropriate Care(non-pressure ulcer on left leg. )                       Personal Care Assistance Level of Assistance  Bathing, Feeding, Dressing Bathing Assistance: Limited assistance Feeding assistance: Independent Dressing Assistance: Limited assistance     Functional Limitations Info  Sight, Hearing, Speech Sight Info: Adequate Hearing Info: Adequate Speech Info: Adequate    SPECIAL CARE FACTORS FREQUENCY  PT (By licensed PT), OT (By licensed OT)     PT Frequency: (5) OT Frequency: (5)            Contractures      Additional Factors Info  Code Status, Allergies Code Status Info: (Full Code. ) Allergies Info: (No Known Allergies. )            Current Medications (11/26/2016):  This is the current hospital active medication list Current Facility-Administered Medications  Medication Dose Route Frequency Provider Last Rate Last Dose  . acetaminophen (TYLENOL) tablet 650 mg  650 mg Oral Q6H PRN Oralia ManisWillis, David, MD       Or  . acetaminophen (TYLENOL) suppository 650 mg  650 mg Rectal Q6H PRN Oralia ManisWillis, David, MD      . collagenase (SANTYL) ointment   Topical Daily Delfino LovettShah, Vipul, MD      . heparin injection 5,000 Units  5,000 Units Subcutaneous Tedra CoupeQ8H Oralia ManisWillis, David, MD   5,000 Units at 11/26/16 1342  . ondansetron (ZOFRAN) tablet 4 mg  4 mg Oral Q6H PRN Oralia ManisWillis, David, MD       Or  . ondansetron [facility](ZOFRAN) injection 4 mg  4 mg Intravenous Q6H PRN Oralia ManisWillis, David, MD      . oxyCODONE (Oxy IR/ROXICODONE) immediate release tablet 5 mg  5 mg Oral Q4H PRN Oralia ManisWillis, David, MD      . vancomycin (VANCOCIN) 1,250 mg in sodium chloride 0.9 % 250 mL IVPB  1,250 mg Intravenous Q24H Valentina GuChristy, Scott D, [facility]PH         Discharge Medications: Please see discharge summary for a list of discharge medications.  Relevant Imaging Results:  Relevant Lab Results:   Additional Information (SSN: [id number])  Sample, Darleen CrockerBailey M, LCSW

## 2016-11-26 NOTE — Clinical Social Work Note (Signed)
Clinical Social Work Assessment  Patient Details  Name: [name] [name] MRN: [id number] Date of Birth: 1926-03-24  Date of referral:  11/26/16               Reason for consult:  Facility Placement                Permission sought to share information with:  Chartered certified accountant granted to share information::  Yes, Verbal Permission Granted  Name::      [place]::   [place]  Relationship::     Contact Information:     Housing/Transportation Living arrangements for the past 2 months:  [place] of Information:  Patient, Adult Children Patient Interpreter Needed:  None Criminal Activity/Legal Involvement Pertinent to Current Situation/Hospitalization:  No - Comment as needed Significant Relationships:  Adult Children Lives with:  Self Do you feel safe going back to the place where you live?  Yes Need for family participation in patient care:  Yes (Comment)  Care giving concerns:  Patient lives in [place] alone and was recently at [facility] for short term rehab.   Social Worker assessment / plan:  Holiday representative (CSW) received consult that patient smelled of urine when she came to [facility]. PT is recommending SNF. CSW met with patient alone at bedside to discuss D/C plan. Patient was alert and oriented X4 and was laying in the bed. CSW introduced self and explained role of CSW department. Patient reported that she lives alone and recently went to [facility]. Patient reported that she has not been managing well at home and is agreeable to going to rehab again. CSW explained to patient that her Mcarthur Rossetti will have to approve rehab again. Patient requested [facility]. CSW contacted patient's son [name]. Per [name] patient really needs long term care and can't walk very well. CSW explained long term care medicaid and how to apply for it. Per [name] he will go to DSS Monday 11/28/16 to apply for long  term care medicaid. CSW explained to [name] that Mcarthur Rossetti has to approve for short term rehab before she can go to SNF. [name] and patient are agreeable to SNF search in [facility] and prefer [facility]. FL2 complete and faxed out. CSW will continue to follow and assist as needed.   Employment status:  Retired Nurse, adult PT Recommendations:  [place] / Referral to community resources:  [place]  Patient/Family's Response to care:  Patient and son are agreeable to SNF search in [place].   Patient/Family's Understanding of and Emotional Response to Diagnosis, Current Treatment, and Prognosis: Patient and her son were very pleasant and thanked CSW for assistance.   Emotional Assessment Appearance:  Appears stated age Attitude/Demeanor/Rapport:    Affect (typically observed):  Accepting, Adaptable, Pleasant Orientation:  Oriented to Self, Oriented to Place, Oriented to  Time, Oriented to Situation Alcohol / Substance use:  Not Applicable Psych involvement (Current and /or in the community):  No (Comment)  Discharge Needs  Concerns to be addressed:  Discharge Planning Concerns Readmission within the last 30 days:  No Current discharge risk:  Dependent with Mobility Barriers to Discharge:  Continued Medical Work up   [employer], Veronia Beets, LCSW 11/26/2016, 3:25 PM

## 2016-11-26 NOTE — Evaluation (Signed)
Physical Therapy Evaluation Patient Details Name: [name] [name] MRN: [id number] DOB: 1926-06-25 Today's Date: 11/26/2016   History of Present Illness  Pt admitted to hospital s/p L LE cellulitis.  PMH includes acute renal failure.  Clinical Impression  Pt is an 83 year old female who lives in a single story home, but reports that she had just discharged from the hospital to a SNF prior to having to be readmitted. Pt states that this was related to having an UNNA boot applied to her L LE.  Pt is able to perform bed mobility with min assistance for mobility of B LE's and is able to scoot to EOB with supervision.  Pt required min assistance to initiate STS with RW and close CGA during ambulation due to unsteadiness on feet.  Pt required 1 rest break during 15 ft of ambulation, presenting with increased a-p sway during quiet stance.  She reported pain in L hip area but was unable to assign a number to the pain.  Pt will continue to benefit from skilled PT with focus on strength, balance, tolerance to activity and pain management.    Follow Up Recommendations SNF    Equipment Recommendations       Recommendations for Other Services       Precautions / Restrictions Precautions Precautions: Fall      Mobility  Bed Mobility Overal bed mobility: Needs Assistance Bed Mobility: Supine to Sit     Supine to sit: Min assist     General bed mobility comments: Pt required hand held assist to perform supine to sit but was able to scoot to EOB and along EOB 2x.  Transfers Overall transfer level: Needs assistance Equipment used: Rolling walker (2 wheeled) Transfers: Sit to/from Stand Sit to Stand: Min assist         General transfer comment: Pt required min assist for initiation of STS and for stability once upright.  Pt required VC's for hand placement on RW and upright posture.  Ambulation/Gait Ambulation/Gait assistance: Min guard Ambulation Distance (Feet): 15 Feet Assistive  device: Rolling walker (2 wheeled) Gait Pattern/deviations: Step-to pattern;Decreased step length - right;Decreased stance time - left   Gait velocity interpretation: Below normal speed for age/gender General Gait Details: Pt maintained a slow gait with low foot clearance, avoiding hip and knee flexion and wt shifting away from L LE.  Pt required one rest break prior to completing 15 ft of ambulation, stating that she felt dizzy.  Stairs            Wheelchair Mobility    Modified Rankin (Stroke Patients Only)       Balance Overall balance assessment: Needs assistance Sitting-balance support: Bilateral upper extremity supported     Postural control: Posterior lean Standing balance support: Bilateral upper extremity supported   Standing balance comment: Pt required RW and close CGA for balance.  She presented with a-p and lateral sway intermittently and stated that she felt dizzy. Single Leg Stance - Right Leg: 0 Single Leg Stance - Left Leg: 0 Tandem Stance - Right Leg: 0 Tandem Stance - Left Leg: 0 Rhomberg - Eyes Opened: 0 Rhomberg - Eyes Closed: 0 High level balance activites: Side stepping High Level Balance Comments: Pt is able to perform side stepping with RW but requires close CGA due to a-p sway during ambulation.             Pertinent Vitals/Pain Pain Assessment: Faces Faces Pain Scale: Hurts little more Pain Location: Pt states that she  feels pain in her hip area but cannot place a number on it.  Pain occurs with transfers and ambulation. Pain Descriptors / Indicators: Aching Pain Intervention(s): Monitored during session    Home Living Family/patient expects to be discharged to:: Private residence Living Arrangements: Alone Available Help at Discharge: Family Type of Home: House Home Access: Stairs to enter Entrance Stairs-Rails: None Entrance Stairs-Number of Steps: 3 Home Layout: One level Home Equipment: Environmental consultantWalker - 2 wheels;Walker - standard       Prior Function Level of Independence: Needs assistance   Gait / Transfers Assistance Needed: Pt came from a SNF, requiring assistance for transfers and ambulation.  ADL's / Homemaking Assistance Needed: Pt reports that son and grandchildren do grocery shopping for her and that she doesn't drive.        Hand Dominance        Extremity/Trunk Assessment   Upper Extremity Assessment Upper Extremity Assessment: Generalized weakness    Lower Extremity Assessment Lower Extremity Assessment: Generalized weakness    Cervical / Trunk Assessment Cervical / Trunk Assessment: Kyphotic  Communication   Communication: No difficulties  Cognition Arousal/Alertness: Awake/alert Behavior During Therapy: WFL for tasks assessed/performed Overall Cognitive Status: Within Functional Limits for tasks assessed                                        General Comments      Exercises     Assessment/Plan    PT Assessment Patient needs continued PT services  PT Problem List Decreased strength;Decreased range of motion;Decreased activity tolerance;Decreased balance;Decreased mobility;Decreased coordination;Obesity;Decreased skin integrity;Pain       PT Treatment Interventions DME instruction;Gait training;Stair training;Therapeutic activities;Therapeutic exercise;Functional mobility training;Neuromuscular re-education;Balance training;Patient/family education    PT Goals (Current goals can be found in the Care Plan section)  Acute Rehab PT Goals Patient Stated Goal: To return to walking the way she was with therapy in the SNF where she was staying. PT Goal Formulation: With patient Time For Goal Achievement: 12/10/16 Potential to Achieve Goals: Good    Frequency Min 2X/week   Barriers to discharge        Co-evaluation               AM-PAC PT "6 Clicks" Daily Activity  Outcome Measure Difficulty turning over in bed (including adjusting bedclothes, sheets and  blankets)?: A Little Difficulty moving from lying on back to sitting on the side of the bed? : A Little Difficulty sitting down on and standing up from a chair with arms (e.g., wheelchair, bedside commode, etc,.)?: A Lot Help needed moving to and from a bed to chair (including a wheelchair)?: A Little Help needed walking in hospital room?: A Lot Help needed climbing 3-5 steps with a railing? : A Lot 6 Click Score: 15    End of Session Equipment Utilized During Treatment: Gait belt Activity Tolerance: Patient limited by fatigue;Patient limited by pain Patient left: in chair;with chair alarm set;with call bell/phone within reach Nurse Communication: Mobility status PT Visit Diagnosis: Unsteadiness on feet (R26.81);Other abnormalities of gait and mobility (R26.89);Muscle weakness (generalized) (M62.81);Difficulty in walking, not elsewhere classified (R26.2);Pain Pain - Right/Left: Left Pain - part of body: Hip    Time: [id number] PT Time Calculation (min) (ACUTE ONLY): 25 min   Charges:   PT Evaluation $PT Eval Moderate Complexity: 1 Mod PT Treatments $Therapeutic Activity: 8-22 mins   PT G Codes:  PT G-Codes **NOT FOR INPATIENT CLASS** Functional Assessment Tool Used: AM-PAC 6 Clicks Basic Mobility Functional Limitation: Mobility: Walking and moving around Mobility: Walking and Moving Around Current Status (Z6109): At least 60 percent but less than 80 percent impaired, limited or restricted Mobility: Walking and Moving Around Goal Status [phone]): At least 1 percent but less than 20 percent impaired, limited or restricted    Glenetta Hew, PT, DPT  Glenetta Hew 11/26/2016, 9:38 AM

## 2016-11-26 NOTE — Clinical Social Work Placement (Signed)
   CLINICAL SOCIAL WORK PLACEMENT  NOTE  Date:  11/26/2016  Patient Details  Name: [name] [name] MRN: [id number] Date of Birth: 1926/08/28  Clinical Social Work is seeking post-discharge placement for this patient at the Skilled  Nursing Facility level of care (*CSW will initial, date and re-position this form in  chart as items are completed):  Yes   Patient/family provided with [facility] Clinical Social Work Department's list of facilities offering this level of care within the geographic area requested by the patient (or if unable, by the patient's family).  Yes   Patient/family informed of their freedom to choose among providers that offer the needed level of care, that participate in Medicare, Medicaid or managed care program needed by the patient, have an available bed and are willing to accept the patient.  Yes   Patient/family informed of [facility]'s ownership interest in [facility] and [facility], as well as of the fact that they are under no obligation to receive care at these facilities.  PASRR submitted to EDS on       PASRR number received on       Existing PASRR number confirmed on 11/26/16     FL2 transmitted to all facilities in geographic area requested by pt/family on 11/26/16     FL2 transmitted to all facilities within larger geographic area on       Patient informed that his/her managed care company has contracts with or will negotiate with certain facilities, including the following:            Patient/family informed of bed offers received.  Patient chooses bed at       Physician recommends and patient chooses bed at      Patient to be transferred to   on  .  Patient to be transferred to facility by       Patient family notified on   of transfer.  Name of family member notified:        PHYSICIAN       Additional Comment:    _______________________________________________ Jillisa Harris, Darleen CrockerBailey M, LCSW 11/26/2016, 3:24 PM

## 2016-11-26 NOTE — Progress Notes (Signed)
[facility] - [place] [facility]   PATIENT NAME: [name] [name]    MR#:  [id number]  DATE OF BIRTH:  [birth year]  SUBJECTIVE:  CHIEF COMPLAINT:   Chief Complaint  Patient presents with  . Wound Check  no complaints, resting ok, agreeable to go to SNF REVIEW OF SYSTEMS:  Review of Systems  Constitutional: Negative for chills, fever and weight loss.  HENT: Negative for nosebleeds and sore throat.   Eyes: Negative for blurred vision.  Respiratory: Negative for cough, shortness of breath and wheezing.   Cardiovascular: Negative for chest pain, orthopnea, leg swelling and PND.  Gastrointestinal: Negative for abdominal pain, constipation, diarrhea, heartburn, nausea and vomiting.  Genitourinary: Negative for dysuria and urgency.  Musculoskeletal: Negative for back pain.  Skin:       LEFT leg wound  Neurological: Negative for dizziness, speech change, focal weakness and headaches.  Endo/Heme/Allergies: Does not bruise/bleed easily.  Psychiatric/Behavioral: Negative for depression.    DRUG ALLERGIES:  No Known Allergies VITALS:  Blood pressure (!) 150/50, pulse 74, temperature 98.9 F (37.2 C), temperature source Oral, resp. rate 16, height 5\' 7"  (1.702 m), weight 98.6 kg (217 lb 4.8 oz), SpO2 99 %. PHYSICAL EXAMINATION:  Physical Exam  Constitutional: She is oriented to person, place, and time and well-developed, well-nourished, and in no distress.  HENT:  Head: Normocephalic and atraumatic.  Eyes: Conjunctivae and EOM are normal. Pupils are equal, round, and reactive to light.  Neck: Normal range of motion. Neck supple. No tracheal deviation present. No thyromegaly present.  Cardiovascular: Normal rate, regular rhythm and normal heart sounds.  Pulmonary/Chest: Effort normal and breath sounds normal. No respiratory distress. She has no wheezes. She exhibits no tenderness.  Abdominal: Soft. Bowel sounds are normal. She exhibits no distension. There is no  tenderness.  Musculoskeletal: Normal range of motion.  Neurological: She is alert and oriented to person, place, and time. No cranial nerve deficit.  Skin: Skin is warm and dry. No rash noted. There is erythema.  left leg with superficial wound , erythematous base, no discharge    Psychiatric: Mood and affect normal.   LABORATORY PANEL:  Female CBC Recent Labs  Lab 11/26/16 0310  WBC 3.9  HGB 8.2*  HCT 25.6*  PLT 325   ------------------------------------------------------------------------------------------------------------------ Chemistries  Recent Labs  Lab 11/24/16 2225  11/26/16 0310  NA 138   < > 140  K 4.2   < > 4.2  CL 106   < > 113*  CO2 22   < > 23  GLUCOSE 118*   < > 97  BUN 48*   < > 22*  CREATININE 1.77*   < > 0.76  CALCIUM 9.2   < > 8.3*  AST 23  --   --   ALT 20  --   --   ALKPHOS 74  --   --   BILITOT 0.6  --   --    < > = values in this interval not displayed.   RADIOLOGY:  No results found. ASSESSMENT AND PLAN:  83 y.o. female who presents with left leg pain.  Patient was recently admitted here and treated for cellulitis.  She has been on doxycycline for the same.  She had Unna boots placed on her legs at some point recently, and states that her legs started hurting significantly.  Left leg Unna boot was removed and she had skin tears and new wounds as well as cellulitis.    * Left  leg cellulitis - Clinically improving with IV antibiotic vanc, wound team following - per their eval patient has  Left anterior lower leg, peeling epithelium:  19 cm x 12 cm x 0.2 cm Left medial lower leg above malleolus:  2.5 cm x 2 cm x 0.2 cm  Left anterior lower leg just above foot:  5 cm x 3 cm x 0.2 cm  Wound ZOX:WRUEAbed:Ruddy red and friable  * Pressure ulcer: on ischium present on admission, dressing changes per wound care nurse  * AKI (acute kidney injury) (HCC) - resolved with  IV fluids, avoid nephrotoxins and monitor for improvement - Creat improved from 1.7->  1.0-->0.76  * Anemia of chronic dz: monitor, no active bleed. - Hb 8.2   PT -recommends SNF, patient agreeable, anticipate Mon  All the records are reviewed and case discussed with Care Management/Social Worker. Management plans discussed with the patient, nursing and they are in agreement.  CODE STATUS: Full Code  TOTAL TIME TAKING CARE OF THIS PATIENT: 27 minutes.   More than 50% of the time was spent in counseling/coordination of care: YES  POSSIBLE D/C IN 2 DAYS, DEPENDING ON CLINICAL CONDITION.   Ramonita LabGouru, Canesha Tesfaye M.D on 11/26/2016 at 11:59 AM  Between 7am to 6pm - Pager - [phone]  After 6pm go to [email or web address] - Social research officer, governmentpassword EPAS [facility]  [facility] [place] [facility]  Office  [phone]  CC: Primary care physician; System, Pcp Not In  Note: This dictation was prepared with Dragon dictation along with smaller phrase technology. Any transcriptional errors that result from this process are unintentional.

## 2016-11-26 NOTE — Progress Notes (Signed)
Pharmacy Antibiotic Note  [name] PatrickDorothy [name] is a 83 y.o. female admitted on 11/24/2016 with cellulitis.  Pharmacy has been consulted for vancomycin dosing.  Plan: Ke= 0.053 h-1 Vd= 53.5 L With improvement in SCr, will increase vancomycin dosing to 1250 mg iv q 24 hours. Will target a trough of 10-15 mcg/ml and check a level with the 4th dose.   Height: 5\' 7"  (170.2 cm) Weight: 217 lb 4.8 oz (98.6 kg) IBW/kg (Calculated) : 61.6  Temp (24hrs), Avg:98.4 F (36.9 C), Min:98.3 F (36.8 C), Max:98.6 F (37 C)  Recent Labs  Lab 11/24/16 2225 11/24/16 2356 11/25/16 0415 11/25/16 1505 11/26/16 0310 11/26/16 0422  WBC 5.1  --   --  4.4 3.9  --   CREATININE 1.77*  --   --  1.07* 0.76  --   LATICACIDVEN 1.0 2.0* 2.2*  --   --  0.8    Estimated Creatinine Clearance: 58.6 mL/min (by C-G formula based on SCr of 0.76 mg/dL).    No Known Allergies  Antimicrobials this admission: Vancomycin 11/16  >>    Dose adjustments this admission:   Microbiology results: 11/16 BCx: NGTD  Thank you for allowing pharmacy to be a part of this patient's care.  Luisa HartChristy, Ark Agrusa D 11/26/2016 7:25 AM

## 2016-11-27 DIAGNOSIS — N179 Acute kidney failure, unspecified: Secondary | ICD-10-CM | POA: Diagnosis not present

## 2016-11-27 DIAGNOSIS — L03116 Cellulitis of left lower limb: Secondary | ICD-10-CM | POA: Diagnosis not present

## 2016-11-27 MED ORDER — BACID PO TABS
2.0000 | ORAL_TABLET | Freq: Three times a day (TID) | ORAL | Status: DC
  Filled 2016-11-27 (×2): qty 2

## 2016-11-27 MED ORDER — RISAQUAD PO CAPS
2.0000 | ORAL_CAPSULE | Freq: Three times a day (TID) | ORAL | Status: DC
  Administered 2016-11-27 – 2016-11-28 (×2): 2 via ORAL
  Filled 2016-11-27 (×2): qty 2

## 2016-11-27 NOTE — Progress Notes (Signed)
[facility] - [place] at [facility]   PATIENT NAME: [name] PatrickDorothy [name]    MR#:  [id number]  DATE OF BIRTH:  06/16/1926  SUBJECTIVE:  CHIEF COMPLAINT:   Chief Complaint  Patient presents with  . Wound Check  no complaints,  agreeable to go to SNF REVIEW OF SYSTEMS:  Review of Systems  Constitutional: Negative for chills, fever and weight loss.  HENT: Negative for nosebleeds and sore throat.   Eyes: Negative for blurred vision.  Respiratory: Negative for cough, shortness of breath and wheezing.   Cardiovascular: Negative for chest pain, orthopnea, leg swelling and PND.  Gastrointestinal: Negative for abdominal pain, constipation, diarrhea, heartburn, nausea and vomiting.  Genitourinary: Negative for dysuria and urgency.  Musculoskeletal: Negative for back pain.  Skin:       LEFT leg wound  Neurological: Negative for dizziness, speech change, focal weakness and headaches.  Endo/Heme/Allergies: Does not bruise/bleed easily.  Psychiatric/Behavioral: Negative for depression.    DRUG ALLERGIES:  No Known Allergies VITALS:  Blood pressure (!) 142/57, pulse 66, temperature 98.9 F (37.2 C), temperature source Oral, resp. rate 20, height 5\' 7"  (1.702 m), weight 98.6 kg (217 lb 4.8 oz), SpO2 99 %. PHYSICAL EXAMINATION:  Physical Exam  Constitutional: She is oriented to person, place, and time and well-developed, well-nourished, and in no distress.  HENT:  Head: Normocephalic and atraumatic.  Eyes: Conjunctivae and EOM are normal. Pupils are equal, round, and reactive to light.  Neck: Normal range of motion. Neck supple. No tracheal deviation present. No thyromegaly present.  Cardiovascular: Normal rate, regular rhythm and normal heart sounds.  Pulmonary/Chest: Effort normal and breath sounds normal. No respiratory distress. She has no wheezes. She exhibits no tenderness.  Abdominal: Soft. Bowel sounds are normal. She exhibits no distension. There is no tenderness.    Musculoskeletal: Normal range of motion.  Neurological: She is alert and oriented to person, place, and time. No cranial nerve deficit.  Skin: Skin is warm and dry. No rash noted. There is erythema.  left leg with superficial wound , erythematous base, no discharge    Psychiatric: Mood and affect normal.   LABORATORY PANEL:  Female CBC Recent Labs  Lab 11/26/16 0310  WBC 3.9  HGB 8.2*  HCT 25.6*  PLT 325   ------------------------------------------------------------------------------------------------------------------ Chemistries  Recent Labs  Lab 11/24/16 2225  11/26/16 0310  NA 138   < > 140  K 4.2   < > 4.2  CL 106   < > 113*  CO2 22   < > 23  GLUCOSE 118*   < > 97  BUN 48*   < > 22*  CREATININE 1.77*   < > 0.76  CALCIUM 9.2   < > 8.3*  AST 23  --   --   ALT 20  --   --   ALKPHOS 74  --   --   BILITOT 0.6  --   --    < > = values in this interval not displayed.   RADIOLOGY:  No results found. ASSESSMENT AND PLAN:  83 y.o. female who presents with left leg pain.  Patient was recently admitted here and treated for cellulitis.  She has been on doxycycline for the same.  She had Unna boots placed on her legs at some point recently, and states that her legs started hurting significantly.  Left leg Unna boot was removed and she had skin tears and new wounds as well as cellulitis.    * Left  leg cellulitis - Clinically improving with IV antibiotic vanc, wound team following -Probiotics - per their eval patient has  Left anterior lower leg, peeling epithelium:  19 cm x 12 cm x 0.2 cm Left medial lower leg above malleolus:  2.5 cm x 2 cm x 0.2 cm  Left anterior lower leg just above foot:  5 cm x 3 cm x 0.2 cm  Wound GEX:BMWUXbed:Ruddy red and friable  * Pressure ulcer: on ischium present on admission, dressing changes per wound care nurse  * AKI (acute kidney injury) (HCC) - resolved with  IV fluids, avoid nephrotoxins and monitor for improvement - Creat improved from 1.7->  1.0-->0.76  * Anemia of chronic dz: monitor, no active bleed. - Hb 8.2 , stable   PT -recommends SNF, patient agreeable, anticipate Mon  All the records are reviewed and case discussed with Care Management/Social Worker. Management plans discussed with the patient, nursing and they are in agreement.  CODE STATUS: Full Code  TOTAL TIME TAKING CARE OF THIS PATIENT: 27 minutes.   More than 50% of the time was spent in counseling/coordination of care: YES  POSSIBLE D/C IN 2 DAYS, DEPENDING ON CLINICAL CONDITION.   Ramonita LabGouru, Edson Deridder M.D on 11/27/2016 at 11:56 AM  Between 7am to 6pm - Pager - [phone]  After 6pm go to [email or web address] - Social research officer, governmentpassword EPAS [facility]  [facility] [place] [facility]  Office  [phone]  CC: Primary care physician; System, Pcp Not In  Note: This dictation was prepared with Dragon dictation along with smaller phrase technology. Any transcriptional errors that result from this process are unintentional.

## 2016-11-28 ENCOUNTER — Other Ambulatory Visit: Payer: Self-pay

## 2016-11-28 ENCOUNTER — Ambulatory Visit: Payer: Medicare HMO | Admitting: *Deleted

## 2016-11-28 ENCOUNTER — Other Ambulatory Visit: Payer: Self-pay | Admitting: *Deleted

## 2016-11-28 DIAGNOSIS — R410 Disorientation, unspecified: Secondary | ICD-10-CM | POA: Diagnosis not present

## 2016-11-28 DIAGNOSIS — Z90710 Acquired absence of both cervix and uterus: Secondary | ICD-10-CM | POA: Diagnosis not present

## 2016-11-28 DIAGNOSIS — J209 Acute bronchitis, unspecified: Secondary | ICD-10-CM | POA: Diagnosis not present

## 2016-11-28 DIAGNOSIS — L89899 Pressure ulcer of other site, unspecified stage: Secondary | ICD-10-CM | POA: Diagnosis not present

## 2016-11-28 DIAGNOSIS — Z833 Family history of diabetes mellitus: Secondary | ICD-10-CM | POA: Diagnosis not present

## 2016-11-28 DIAGNOSIS — D649 Anemia, unspecified: Secondary | ICD-10-CM | POA: Diagnosis not present

## 2016-11-28 DIAGNOSIS — I872 Venous insufficiency (chronic) (peripheral): Secondary | ICD-10-CM | POA: Diagnosis not present

## 2016-11-28 DIAGNOSIS — D638 Anemia in other chronic diseases classified elsewhere: Secondary | ICD-10-CM | POA: Diagnosis not present

## 2016-11-28 DIAGNOSIS — R5381 Other malaise: Secondary | ICD-10-CM | POA: Diagnosis not present

## 2016-11-28 DIAGNOSIS — R4182 Altered mental status, unspecified: Secondary | ICD-10-CM | POA: Diagnosis not present

## 2016-11-28 DIAGNOSIS — L03116 Cellulitis of left lower limb: Secondary | ICD-10-CM | POA: Diagnosis not present

## 2016-11-28 DIAGNOSIS — N179 Acute kidney failure, unspecified: Secondary | ICD-10-CM | POA: Diagnosis not present

## 2016-11-28 DIAGNOSIS — I890 Lymphedema, not elsewhere classified: Secondary | ICD-10-CM | POA: Diagnosis not present

## 2016-11-28 DIAGNOSIS — L0390 Cellulitis, unspecified: Secondary | ICD-10-CM | POA: Diagnosis not present

## 2016-11-28 DIAGNOSIS — L89159 Pressure ulcer of sacral region, unspecified stage: Secondary | ICD-10-CM | POA: Diagnosis not present

## 2016-11-28 DIAGNOSIS — F4323 Adjustment disorder with mixed anxiety and depressed mood: Secondary | ICD-10-CM | POA: Diagnosis not present

## 2016-11-28 DIAGNOSIS — M6281 Muscle weakness (generalized): Secondary | ICD-10-CM | POA: Diagnosis not present

## 2016-11-28 DIAGNOSIS — Z111 Encounter for screening for respiratory tuberculosis: Secondary | ICD-10-CM | POA: Diagnosis not present

## 2016-11-28 DIAGNOSIS — Z743 Need for continuous supervision: Secondary | ICD-10-CM | POA: Diagnosis not present

## 2016-11-28 DIAGNOSIS — Z7982 Long term (current) use of aspirin: Secondary | ICD-10-CM | POA: Diagnosis not present

## 2016-11-28 LAB — BASIC METABOLIC PANEL
ANION GAP: 7 (ref 5–15)
BUN: 16 mg/dL (ref 6–20)
CALCIUM: 8.3 mg/dL — AB (ref 8.9–10.3)
CHLORIDE: 110 mmol/L (ref 101–111)
CO2: 22 mmol/L (ref 22–32)
Creatinine, Ser: 0.81 mg/dL (ref 0.44–1.00)
GFR calc Af Amer: >60 mL/min (ref >60)
GFR calc non Af Amer: >60 mL/min (ref >60)
GLUCOSE: 100 mg/dL — AB (ref 65–99)
Potassium: 4.3 mmol/L (ref 3.5–5.1)
Sodium: 139 mmol/L (ref 135–145)

## 2016-11-28 LAB — VANCOMYCIN, TROUGH: Vancomycin Tr: 13 ug/mL — ABNORMAL LOW (ref 15–20)

## 2016-11-28 MED ORDER — COLLAGENASE 250 UNIT/GM EX OINT: TOPICAL_OINTMENT | Freq: Every day | CUTANEOUS | 0 refills | Status: DC

## 2016-11-28 MED ORDER — AMOXICILLIN-POT CLAVULANATE 500-125 MG PO TABS: 1.0000 | ORAL_TABLET | Freq: Three times a day (TID) | ORAL | 0 refills | Status: DC

## 2016-11-28 MED ORDER — ASPIRIN 81 MG PO TBEC: 81.0000 mg | DELAYED_RELEASE_TABLET | Freq: Every day | ORAL | Status: DC

## 2016-11-28 MED ORDER — OXYCODONE HCL 5 MG PO TABS: 5.0000 mg | ORAL_TABLET | ORAL | 0 refills | Status: DC | PRN

## 2016-11-28 MED ORDER — ONDANSETRON HCL 4 MG PO TABS: 4.0000 mg | ORAL_TABLET | Freq: Four times a day (QID) | ORAL | 0 refills | Status: DC | PRN

## 2016-11-28 MED ORDER — RISAQUAD PO CAPS: 2.0000 | ORAL_CAPSULE | Freq: Three times a day (TID) | ORAL | 0 refills | Status: DC

## 2016-11-28 MED ORDER — ACETAMINOPHEN 325 MG PO TABS: 650.0000 mg | ORAL_TABLET | Freq: Four times a day (QID) | ORAL | Status: DC | PRN

## 2016-11-28 NOTE — H&P (Addendum)
[facility]. Discharge papers given to pt. All questions answered. Pt to transport via EMS once they arrive.

## 2016-11-28 NOTE — Consult Note (Addendum)
   [facility] Consult   11/28/2016  [name] 05/22/1926 [id number]    [facility] Management services. She is followed by Telephonic RNCM. Please see chart review then encounters for further patient outreach details.   Spoke with inpatient LCSW to confirm disposition plans are for SNF, [facility].  Will [facility].   Raiford NobleAtika Dameon Soltis, MSN-Ed, RN,BSN [facility] Care Management Hospital Liaison [phone]

## 2016-11-28 NOTE — Discharge Summary (Signed)
[facility] -  at [facility]   PATIENT NAME: [name] [name]    MR#:  [id number]  DATE OF BIRTH:  1926/03/05  DATE OF ADMISSION:  11/24/2016 ADMITTING PHYSICIAN: Oralia Manis, MD  DATE OF DISCHARGE: 11/28/16  PRIMARY CARE PHYSICIAN: System, Pcp Not In    ADMISSION DIAGNOSIS:  Cellulitis of left lower extremity [L03.116]  DISCHARGE DIAGNOSIS:  Principal Problem:   Left leg cellulitis Active Problems:   AKI (acute kidney injury) (HCC)   SECONDARY DIAGNOSIS:   Past Medical History:  Diagnosis Date  . Cellulitis 10/28/2016   bilateral lower extremities.     HOSPITAL COURSE:   83 y.o.femalewho presents with left leg pain. Patient was recently admitted here and treated for cellulitis. She has been on doxycycline for the same. She had Unna boots placed on her legs at some point recently, and states that her legs started hurting significantly. Left leg Unna boot was removed and she had skin tears and new wounds as well as cellulitis.    * Left leg cellulitis - Clinically improving with IV antibiotic vanc, wound team following, discharge patient with p.o. Augmentin for 10 days Blood cultures negative -Probiotics - per their eval patient has  Left anterior lower leg, peeling epithelium: 19 cm x 12 cm x 0.2 cm Left medial lower leg above malleolus: 2.5 cm x 2 cm x 0.2 cm  Left anterior lower leg just above foot: 5 cm x 3 cm x 0.2 cm  Wound FAO:ZHYQM red and friable  * Pressure ulcer: on ischium present on admission, dressing changes per wound care nurse  *AKI (acute kidney injury) (HCC) - resolved with  IV fluids, avoid nephrotoxins and monitor for improvement - Creat improved from 1.7-> 1.0-->0.76  * Anemia of chronic dz: monitor, no active bleed. - Hb 8.2 , stable   PT -recommends SNF, patient agreeable    DISCHARGE CONDITIONS:   stable  CONSULTS OBTAINED:     PROCEDURES  None   DRUG ALLERGIES:  No Known  Allergies  DISCHARGE MEDICATIONS:   Current Discharge Medication List    START taking these medications   Details  acetaminophen (TYLENOL) 325 MG tablet Take 2 tablets (650 mg total) every 6 (six) hours as needed by mouth for mild pain (or Fever >/= 101).    acidophilus (RISAQUAD) CAPS capsule Take 2 capsules 3 (three) times daily by mouth. Qty: 90 capsule, Refills: 0    amoxicillin-clavulanate (AUGMENTIN) 500-125 MG tablet Take 1 tablet (500 mg total) 3 (three) times daily by mouth. Qty: 30 tablet, Refills: 0    collagenase (SANTYL) ointment Apply daily topically. Qty: 15 g, Refills: 0    ondansetron (ZOFRAN) 4 MG tablet Take 1 tablet (4 mg total) every 6 (six) hours as needed by mouth for nausea. Qty: 20 tablet, Refills: 0    oxyCODONE (OXY IR/ROXICODONE) 5 MG immediate release tablet Take 1 tablet (5 mg total) every 4 (four) hours as needed by mouth for moderate pain. Qty: 30 tablet, Refills: 0      CONTINUE these medications which have NOT CHANGED   Details  ascorbic acid (VITAMIN C) 100 MG tablet Take 1 tablet (100 mg total) by mouth daily. Qty: 30 tablet, Refills: 0    Aspirin-Caffeine 400-32 MG TABS Take 2 tablets by mouth daily.    ferrous sulfate 325 (65 FE) MG tablet Take 1 tablet (325 mg total) by mouth daily. Qty: 30 tablet, Refills: 3    furosemide (LASIX) 20 MG tablet Take 1  tablet (20 mg total) by mouth daily as needed for edema. Qty: 20 tablet, Refills: 0    Multiple Vitamins-Minerals (MULTIVITAL) tablet Take 1 tablet by mouth daily. Qty: 30 tablet, Refills: 0      STOP taking these medications     cephALEXin (KEFLEX) 500 MG capsule      ketoconazole (NIZORAL) 2 % cream      potassium chloride (K-DUR) 10 MEQ tablet          DISCHARGE INSTRUCTIONS:   Follow-up with primary care physician at the facility in 3-5 days Continue dressing care on Monday, Wednesday and Friday apply Santyl ointment, during dressing changes   DIET:  Cardiac  diet  DISCHARGE CONDITION:  Stable  ACTIVITY:  Activity as tolerated  OXYGEN:  Home Oxygen: No.   Oxygen Delivery: room air  DISCHARGE LOCATION:  nursing home, [facility]  If you experience worsening of your admission symptoms, develop shortness of breath, life threatening emergency, suicidal or homicidal thoughts you must seek medical attention immediately by calling 911 or calling your MD immediately  if symptoms less severe.  You Must read complete instructions/literature along with all the possible adverse reactions/side effects for all the Medicines you take and that have been prescribed to you. Take any new Medicines after you have completely understood and accpet all the possible adverse reactions/side effects.   Please note  You were cared for by a hospitalist during your hospital stay. If you have any questions about your discharge medications or the care you received while you were in the hospital after you are discharged, you can call the unit and asked to speak with the hospitalist on call if the hospitalist that took care of you is not available. Once you are discharged, your primary care physician will handle any further medical issues. Please note that NO REFILLS for any discharge medications will be authorized once you are discharged, as it is imperative that you return to your primary care physician (or establish a relationship with a primary care physician if you do not have one) for your aftercare needs so that they can reassess your need for medications and monitor your lab values.     Today  Chief Complaint  Patient presents with  . Wound Check   Resting comfortably.  No overnight events.  Agreeable to go to skilled nursing facility  ROS:  CONSTITUTIONAL: Denies fevers, chills. Denies any fatigue, weakness.  EYES: Denies blurry vision, double vision, eye pain. EARS, NOSE, THROAT: Denies tinnitus, ear pain, hearing loss. RESPIRATORY: Denies cough,  wheeze, shortness of breath.  CARDIOVASCULAR: Denies chest pain, palpitations, edema.  GASTROINTESTINAL: Denies nausea, vomiting, diarrhea, abdominal pain. Denies bright red blood per rectum. GENITOURINARY: Denies dysuria, hematuria. ENDOCRINE: Denies nocturia or thyroid problems. HEMATOLOGIC AND LYMPHATIC: Denies easy bruising or bleeding. SKIN: Left lower extremity wound MUSCULOSKELETAL: Denies pain in neck, back, shoulder, knees, hips or arthritic symptoms.  NEUROLOGIC: Denies paralysis, paresthesias.  PSYCHIATRIC: Denies anxiety or depressive symptoms.   VITAL SIGNS:  Blood pressure (!) 123/55, pulse 66, temperature 98.9 F (37.2 C), temperature source Oral, resp. rate 18, height 5\' 7"  (1.702 m), weight 98.6 kg (217 lb 4.8 oz), SpO2 97 %.  I/O:    Intake/Output Summary (Last 24 hours) at 11/28/2016 1002 Last data filed at 11/28/2016 0518 Gross per 24 hour  Intake 240 ml  Output 700 ml  Net -460 ml    PHYSICAL EXAMINATION:  GENERAL:  83 y.o.-year-old patient lying in the bed with no acute distress.  EYES: Pupils equal, round, reactive to light and accommodation. No scleral icterus. Extraocular muscles intact.  HEENT: Head atraumatic, normocephalic. Oropharynx and nasopharynx clear.  NECK:  Supple, no jugular venous distention. No thyroid enlargement, no tenderness.  LUNGS: Normal breath sounds bilaterally, no wheezing, rales,rhonchi or crepitation. No use of accessory muscles of respiration.  CARDIOVASCULAR: S1, S2 normal. No murmurs, rubs, or gallops.  ABDOMEN: Soft, non-tender, non-distended. Bowel sounds present. No organomegaly or mass.  EXTREMITIES: No pedal edema, cyanosis, or clubbing.  NEUROLOGIC: Cranial nerves II through XII are intact. Muscle strength 5/5 in all extremities. Sensation intact. Gait not checked.  PSYCHIATRIC: The patient is alert and oriented x 3.  SKIN: .  Left leg with a superficial wound erythematous base with no purulent discharge  DATA REVIEW:    CBC Recent Labs  Lab 11/26/16 0310  WBC 3.9  HGB 8.2*  HCT 25.6*  PLT 325    Chemistries  Recent Labs  Lab 11/24/16 2225  11/28/16 0339  NA 138   < > 139  K 4.2   < > 4.3  CL 106   < > 110  CO2 22   < > 22  GLUCOSE 118*   < > 100*  BUN 48*   < > 16  CREATININE 1.77*   < > 0.81  CALCIUM 9.2   < > 8.3*  AST 23  --   --   ALT 20  --   --   ALKPHOS 74  --   --   BILITOT 0.6  --   --    < > = values in this interval not displayed.    Cardiac Enzymes Recent Labs  Lab 11/24/16 2225  TROPONINI <0.03    Microbiology Results  Results for orders placed or performed during the hospital encounter of 11/24/16  Blood Culture (routine x 2)     Status: None (Preliminary result)   Collection Time: 11/24/16 10:25 PM  Result Value Ref Range Status   Specimen Description BLOOD LT [facility]  Final   Special Requests   Final    BOTTLES DRAWN AEROBIC AND ANAEROBIC Blood Culture adequate volume   Culture NO GROWTH 4 DAYS  Final   Report Status PENDING  Incomplete  Blood Culture (routine x 2)     Status: None (Preliminary result)   Collection Time: 11/24/16 11:56 PM  Result Value Ref Range Status   Specimen Description BLOOD RT FOREARM  Final   Special Requests   Final    BOTTLES DRAWN AEROBIC AND ANAEROBIC Blood Culture adequate volume   Culture NO GROWTH 3 DAYS  Final   Report Status PENDING  Incomplete    RADIOLOGY:  Dg Chest 1 View  Result Date: 11/25/2016 CLINICAL DATA:  Acute onset of bleeding from leg wound. Shortness of breath. EXAM: CHEST 1 VIEW COMPARISON:  Chest radiograph performed 11/04/2016 FINDINGS: The lungs are well-aerated. Mild peribronchial thickening is noted. There is no evidence of focal opacification, pleural effusion or pneumothorax. The cardiomediastinal silhouette is within normal limits. No acute osseous abnormalities are seen. IMPRESSION: Mild peribronchial thickening noted.  Lungs otherwise clear. Electronically Signed   By: Roanna RaiderJeffery  Chang M.D.   On:  11/25/2016 00:15   Dg Tibia/fibula Left  Result Date: 11/25/2016 CLINICAL DATA:  Wound bleeding at the left leg, acute onset. EXAM: LEFT TIBIA AND FIBULA - 2 VIEW COMPARISON:  None. FINDINGS: The known soft tissue wound is not well characterized on radiograph. Diffuse soft tissue swelling is noted. No radiopaque  foreign bodies are seen. There is degenerative change at the left knee, with chronic depression of the medial tibial plateau and bony remodeling. The ankle mortise is incompletely assessed, but appears grossly unremarkable. There is no evidence of fracture or dislocation. IMPRESSION: Known soft tissue wound is not well characterized on radiograph. No radiopaque foreign bodies seen. Electronically Signed   By: Roanna Raider M.D.   On: 11/25/2016 00:18   US Venous Img Lower Unilateral Left  Result Date: 11/25/2016 CLINICAL DATA:  83 year old female with left lower extremity swelling and pain. EXAM: Left LOWER EXTREMITY VENOUS DOPPLER ULTRASOUND TECHNIQUE: Gray-scale sonography with graded compression, as well as color Doppler and duplex ultrasound were performed to evaluate the lower extremity deep venous systems from the level of the common femoral vein and including the common femoral, femoral, profunda femoral, popliteal and calf veins including the posterior tibial, peroneal and gastrocnemius veins when visible. The superficial great saphenous vein was also interrogated. Spectral Doppler was utilized to evaluate flow at rest and with distal augmentation maneuvers in the common femoral, femoral and popliteal veins. COMPARISON:  Left lower extremity radiograph dated 11/24/2016 FINDINGS: Contralateral Common Femoral Vein: Respiratory phasicity is normal and symmetric with the symptomatic side. No evidence of thrombus. Normal compressibility. Common Femoral Vein: No evidence of thrombus. Normal compressibility, respiratory phasicity and response to augmentation. Saphenofemoral Junction: No evidence  of thrombus. Normal compressibility and flow on color Doppler imaging. Profunda Femoral Vein: No evidence of thrombus. Normal compressibility and flow on color Doppler imaging. Femoral Vein: No evidence of thrombus. Normal compressibility, respiratory phasicity and response to augmentation. Popliteal Vein: No evidence of thrombus. Normal compressibility, respiratory phasicity and response to augmentation. Calf Veins: The calf veins are not visualized. Superficial Great Saphenous Vein: No evidence of thrombus. Normal compressibility. Venous Reflux:  None. Other Findings:  Diffuse subcutaneous edema. IMPRESSION: No evidence of deep venous thrombosis in the visualized left lower extremity veins. Electronically Signed   By: Elgie Collard M.D.   On: 11/25/2016 01:49    EKG:   Orders placed or performed during the hospital encounter of 11/24/16  . EKG 12-Lead  . EKG 12-Lead      Management plans discussed with the patient, family and they are in agreement.  CODE STATUS:     Code Status Orders  (From admission, onward)        Start     Ordered   11/25/16 0239  Full code  Continuous     11/25/16 0238    Code Status History    Date Active Date Inactive Code Status Order ID Comments User Context   11/05/2016 01:04 11/07/2016 22:37 Full Code [id number]  Arnaldo Natal, MD ED      TOTAL TIME TAKING CARE OF THIS PATIENT: 45  minutes.   Note: This dictation was prepared with Dragon dictation along with smaller phrase technology. Any transcriptional errors that result from this process are unintentional.   @[facility] @  on 11/28/2016 at 10:02 AM  Between 7am to 6pm - Pager - [phone]  After 6pm go to [email or web address] - password [email or web address]  [facility]  Office  [phone]  CC: Primary care physician; System, Pcp Not In

## 2016-11-28 NOTE — Clinical Social Work Placement (Signed)
   CLINICAL SOCIAL WORK PLACEMENT  NOTE  Date:  11/28/2016  Patient Details  Name: [name] [name] MRN: [id number] Date of Birth: 12-Aug-1926  Clinical Social Work is seeking post-discharge placement for this patient at the Skilled  Nursing Facility level of care (*CSW will initial, date and re-position this form in  chart as items are completed):  Yes   Patient/family provided with [facility] Clinical Social Work Department's list of facilities offering this level of care within the geographic area requested by the patient (or if unable, by the patient's family).  Yes   Patient/family informed of their freedom to choose among providers that offer the needed level of care, that participate in Medicare, Medicaid or managed care program needed by the patient, have an available bed and are willing to accept the patient.  Yes   Patient/family informed of [place]'s ownership interest in [facility] and [facility], as well as of the fact that they are under no obligation to receive care at these facilities.  PASRR submitted to EDS on       PASRR number received on       Existing PASRR number confirmed on 11/26/16     FL2 transmitted to all facilities in geographic area requested by pt/family on 11/26/16     FL2 transmitted to all facilities within larger geographic area on       Patient informed that his/her managed care company has contracts with or will negotiate with certain facilities, including the following:        Yes   Patient/family informed of bed offers received.  Patient chooses bed at [facility])     Physician recommends and patient chooses bed at [facility](SNF)    Patient to be transferred to [facility]) on 11/28/16.  Patient to be transferred to facility by (EMS)     Patient family notified on 11/28/16 of transfer.  Name of family member notified:  (son)     PHYSICIAN       Additional Comment:     _______________________________________________ York SpanielMonica Ashika Apuzzo, LCSW 11/28/2016, 3:58 PM

## 2016-11-28 NOTE — Discharge Instructions (Signed)
Follow-up with primary care physician at the facility in 3-5 days Continue dressing care on Monday, Wednesday and Friday

## 2016-11-28 NOTE — Patient Outreach (Signed)
[facility] Care Management  11/28/2016  [name] PatrickDorothy [name] August 29, 1926 [id number]   This social worker received a referral from Kohl'stika hall, [facility] liaison to follow up with patient while in rehab at [facility].  Visit scheduled for 12/05/16   Adriana ReamsChrystal Land, LCSW [facility] Care Management [phone]

## 2016-11-28 NOTE — Care Management (Signed)
Patient to discharge to  [facility] today.

## 2016-11-28 NOTE — Clinical Social Work Note (Signed)
MD to discharge patient. CSW spoke with patient's son: Lavera GuiseJerry Wimer: [phone] and he chose [facility]. CSW was going to do a letter of guarantee in order to facilitate patient's discharge in the morning via EMS however, Olegario MessierKathy with [facility] would not accept anything less than a 10 LOG and we can only do a 5 day LOG, thus we will have to wait for [place] to get prior auth from [employer]. If Berkley Harveyauth is obtained this will cause patient's discharge to be later in the day. York SpanielMonica Trinika Cortese MSW,LCSW [phone]

## 2016-11-28 NOTE — Clinical Social Work Note (Signed)
CSW informed by [place] at [facility] that they have obtained auth from [employer] at 3:30 in the afternoon. CSW had patient ready this morning at 9:30 a.m. EMS has been called and have other transports ahead of her.  York SpanielMonica Bethanee Redondo MSW,LCSW [phone]

## 2016-11-28 NOTE — Patient Outreach (Signed)
[facility]) Care Management  11/28/2016  [name] PatrickDorothy [name] 10/07/1926 [id number]   RNCM received update from Raiford NobleAtika Hall, [facility] hospital liaison on patients status.  Patient recently readmitted from 11/24/16 to 11/28/16.  Patient discharged from [place] to skilled nursing facility.  PLAN: No further follow up needed by this RNCM.   George InaDavina Aliana Kreischer RN,BSN,CCM [facility] Telephonic  [phone]

## 2016-11-28 NOTE — Progress Notes (Signed)
Pharmacy Antibiotic Note  Pat [name] [name] is a 83 y.o. female admitted on 11/24/2016 with cellulitis.  Pharmacy has been consulted for vancomycin dosing.  Plan: Ke= 0.053 h-1 Vd= 53.5 L  Vancomycin trough resulted as 13 and is within the desired trough goal of 10-15. Will continue Vancomycin 1250mg  Q24H. Pharmacy will continue to monitor as Scr continues to improve.   Height: 5\' 7"  (170.2 cm) Weight: 217 lb 4.8 oz (98.6 kg) IBW/kg (Calculated) : 61.6  Temp (24hrs), Avg:99 F (37.2 C), Min:98.8 F (37.1 C), Max:99.3 F (37.4 C)  Recent Labs  Lab 11/24/16 2225 11/24/16 2356 11/25/16 0415 11/25/16 1505 11/26/16 0310 11/26/16 0422 11/26/16 0739 11/28/16 0339 11/28/16 1516  WBC 5.1  --   --  4.4 3.9  --   --   --   --   CREATININE 1.77*  --   --  1.07* 0.76  --   --  0.81  --   LATICACIDVEN 1.0 2.0* 2.2*  --   --  0.8 0.7  --   --   VANCOTROUGH  --   --   --   --   --   --   --   --  13*    Estimated Creatinine Clearance: 57.9 mL/min (by C-G formula based on SCr of 0.81 mg/dL).    No Known Allergies  Antimicrobials this admission: Vancomycin 11/16  >>    Dose adjustments this admission:   Microbiology results: 11/16 BCx: NGTD  Thank you for allowing pharmacy to be a part of this patient's care.  Yolanda BonineHannah Haily Caley, PharmD Pharmacy Resident 11/28/2016 3:56 PM

## 2016-11-29 DIAGNOSIS — L03116 Cellulitis of left lower limb: Secondary | ICD-10-CM | POA: Diagnosis not present

## 2016-11-29 DIAGNOSIS — D638 Anemia in other chronic diseases classified elsewhere: Secondary | ICD-10-CM | POA: Diagnosis not present

## 2016-11-29 DIAGNOSIS — N179 Acute kidney failure, unspecified: Secondary | ICD-10-CM | POA: Diagnosis not present

## 2016-11-29 LAB — CULTURE, BLOOD (ROUTINE X 2)
CULTURE: NO GROWTH
SPECIAL REQUESTS: ADEQUATE

## 2016-11-30 LAB — CULTURE, BLOOD (ROUTINE X 2)
Culture: NO GROWTH
Special Requests: ADEQUATE

## 2016-11-30 NOTE — Telephone Encounter (Signed)
This encounter was created in error - please disregard.

## 2016-12-03 DIAGNOSIS — D649 Anemia, unspecified: Secondary | ICD-10-CM | POA: Diagnosis not present

## 2016-12-03 DIAGNOSIS — L03116 Cellulitis of left lower limb: Secondary | ICD-10-CM | POA: Diagnosis not present

## 2016-12-03 DIAGNOSIS — N179 Acute kidney failure, unspecified: Secondary | ICD-10-CM | POA: Diagnosis not present

## 2016-12-05 ENCOUNTER — Other Ambulatory Visit: Payer: Self-pay | Admitting: *Deleted

## 2016-12-06 NOTE — Patient Outreach (Signed)
  [facility] Care Management  [facility]  12/06/2016  [name] PatrickDorothy [name] April 27, 1926 [id number]  Subjective:  Patient is a 83 year old female currently in rehab at [facility] followiing a hospital stay for cellulitis.  This social work spoke with the discharge planner Synetta Failnita regarding patient's progress in rehab. Per Synetta FailAnita, she has not made much progress in therapy yet. There is no discharge date set yet. This social worker made efforts to follow up with patient, however patient complained of increased constipation and asked if I could return at another time.   Objective:   Encounter Medications:  Outpatient Encounter Medications as of 12/05/2016  Medication Sig  . acetaminophen (TYLENOL) 325 MG tablet Take 2 tablets (650 mg total) every 6 (six) hours as needed by mouth for mild pain (or Fever >/= 101).  Marland Kitchen. acidophilus (RISAQUAD) CAPS capsule Take 2 capsules 3 (three) times daily by mouth.  Marland Kitchen. amoxicillin-clavulanate (AUGMENTIN) 500-125 MG tablet Take 1 tablet (500 mg total) 3 (three) times daily by mouth.  Marland Kitchen. ascorbic acid (VITAMIN C) 100 MG tablet Take 1 tablet (100 mg total) by mouth daily.  . Aspirin-Caffeine 400-32 MG TABS Take 2 tablets by mouth daily.  . collagenase (SANTYL) ointment Apply daily topically.  . ferrous sulfate 325 (65 FE) MG tablet Take 1 tablet (325 mg total) by mouth daily.  . furosemide (LASIX) 20 MG tablet Take 1 tablet (20 mg total) by mouth daily as needed for edema.  . Multiple Vitamins-Minerals (MULTIVITAL) tablet Take 1 tablet by mouth daily.  . ondansetron (ZOFRAN) 4 MG tablet Take 1 tablet (4 mg total) every 6 (six) hours as needed by mouth for nausea.  Marland Kitchen. oxyCODONE (OXY IR/ROXICODONE) 5 MG immediate release tablet Take 1 tablet (5 mg total) every 4 (four) hours as needed by mouth for moderate pain.   No facility-administered encounter medications on file as of 12/05/2016.     Functional Status:  In your present state of  health, do you have any difficulty performing the following activities: 11/25/2016 11/05/2016  Hearing? N N  Vision? N N  Difficulty concentrating or making decisions? N N  Walking or climbing stairs? Y N  Dressing or bathing? Y N  Doing errands, shopping? Y N  Some recent data might be hidden    Fall/Depression Screening:  No flowsheet data found.  Assessment: Patient visibly uncomfortable during the brief visit. Patient requested that I return at another time.  Patient actively working with the nurse to address her constipation.    Plan: This Child psychotherapistsocial worker will follow up with patient within 2 weeks to discuss discharge plan.   Adriana ReamsChrystal Kataleya Zaugg, LCSW [facility] Care Management [phone]

## 2016-12-13 ENCOUNTER — Encounter: Payer: Self-pay | Admitting: *Deleted

## 2016-12-13 ENCOUNTER — Other Ambulatory Visit: Payer: Self-pay | Admitting: *Deleted

## 2016-12-14 NOTE — Patient Outreach (Signed)
[facility]) Care Management  [facility] Social Work  12/14/2016  Cheryle Dark 09/22/1926 [id number]  Subjective:  Patient is a 83 year old female currently in rehab at [facility] following a hospital stay for cellulitis. Per patient , she resides in her own home. She has a son and grandson that live locally and visit her regularly to assist with her everyday needs . Per patient, she is able to complete her ADL's independently. Her son and grandson help with lawn care, and transportation to medical appointments. She describes them as a positive support. Per patient, she is anxious to return home and is making efforts to participate in treatment in order to return home. Patient's plan is to return to her own home with the assistance of her son and grandson when needed.   Objective:   Encounter Medications:  Outpatient Encounter Medications as of 12/13/2016  Medication Sig  . acetaminophen (TYLENOL) 325 MG tablet Take 2 tablets (650 mg total) every 6 (six) hours as needed by mouth for mild pain (or Fever >/= 101).  Marland Kitchen acidophilus (RISAQUAD) CAPS capsule Take 2 capsules 3 (three) times daily by mouth.  Marland Kitchen amoxicillin-clavulanate (AUGMENTIN) 500-125 MG tablet Take 1 tablet (500 mg total) 3 (three) times daily by mouth.  Marland Kitchen ascorbic acid (VITAMIN C) 100 MG tablet Take 1 tablet (100 mg total) by mouth daily.  . Aspirin-Caffeine 400-32 MG TABS Take 2 tablets by mouth daily.  . collagenase (SANTYL) ointment Apply daily topically.  . ferrous sulfate 325 (65 FE) MG tablet Take 1 tablet (325 mg total) by mouth daily.  . furosemide (LASIX) 20 MG tablet Take 1 tablet (20 mg total) by mouth daily as needed for edema.  . Multiple Vitamins-Minerals (MULTIVITAL) tablet Take 1 tablet by mouth daily.  . ondansetron (ZOFRAN) 4 MG tablet Take 1 tablet (4 mg total) every 6 (six) hours as needed by mouth for nausea.  Marland Kitchen oxyCODONE (OXY IR/ROXICODONE) 5 MG immediate release tablet Take 1 tablet  (5 mg total) every 4 (four) hours as needed by mouth for moderate pain.   No facility-administered encounter medications on file as of 12/13/2016.     Functional Status:  In your present state of health, do you have any difficulty performing the following activities: 12/13/2016 11/25/2016  Hearing? N N  Vision? N N  Difficulty concentrating or making decisions? N N  Walking or climbing stairs? Y Y  Dressing or bathing? Y Y  Doing errands, shopping? - Y  Preparing Food and eating ? N -  Using the Toilet? N -  In the past six months, have you accidently leaked urine? N -  Do you have problems with loss of bowel control? N -  Managing your Medications? N -  Managing your Finances? N -  Housekeeping or managing your Housekeeping? Y -  Comment grandson helps with housekeeping and yard is mowed -  Some recent data might be hidden    Fall/Depression Screening:  PHQ 2/9 Scores 12/13/2016  PHQ - 2 Score 0    Assessment: Patient very pleasant. [facility] care management program discussed, patient agreed verbally to participate. Patient discussed plan to return home and participating actively in order to return home as soon as possible. Patient reports feeling safe in her home with all of her needs being met.    This Education officer, museum spoke with the discharge planner at [facility]. Per Rodena Piety, the plan is for patient to return home, however there is not discharge date  set yet. Patient currently receiving PT and OT 6 days per week.  Plan:  This Education officer, museum will continue to follow patient while in rehab to assist with closing any gaps to care. This Education officer, museum to follow up with patient within 2 weeks.   Sheralyn Boatman [facility] Care Management [phone]

## 2016-12-20 DIAGNOSIS — F4323 Adjustment disorder with mixed anxiety and depressed mood: Secondary | ICD-10-CM | POA: Diagnosis not present

## 2016-12-20 DIAGNOSIS — R410 Disorientation, unspecified: Secondary | ICD-10-CM | POA: Diagnosis not present

## 2016-12-22 DIAGNOSIS — R410 Disorientation, unspecified: Secondary | ICD-10-CM | POA: Diagnosis not present

## 2016-12-22 DIAGNOSIS — F4323 Adjustment disorder with mixed anxiety and depressed mood: Secondary | ICD-10-CM | POA: Diagnosis not present

## 2016-12-23 ENCOUNTER — Other Ambulatory Visit: Payer: Self-pay | Admitting: *Deleted

## 2016-12-23 ENCOUNTER — Ambulatory Visit: Payer: Self-pay | Admitting: *Deleted

## 2016-12-23 NOTE — Patient Outreach (Signed)
[facility]N) Care Management  12/23/2016  [name] PatrickDorothy [name] 07/13/1926 [id number]   Phone call to discharge planner [name] to discuss patient's progress in rehab. Per Synetta FailAnita, following her conversation with rehab, patient is not progressing, she continues to need  assistance with ADL's and transferring.  Therapy is recommending long term care for patient. Per the discharge planner, she will contact patient's son to discuss long term care recommendations.   Plan: This Child psychotherapistsocial worker will follow up with discharge planner regarding patient's discharge plan within 1 week.   Adriana ReamsChrystal Adolphe Fortunato, LCSW [facility] Care Management [phone]

## 2016-12-28 ENCOUNTER — Ambulatory Visit: Payer: Self-pay | Admitting: *Deleted

## 2016-12-29 ENCOUNTER — Other Ambulatory Visit: Payer: Self-pay | Admitting: *Deleted

## 2016-12-29 ENCOUNTER — Encounter: Payer: Self-pay | Admitting: *Deleted

## 2016-12-29 NOTE — Patient Outreach (Signed)
[facility]N) Care Management  [facility]  12/29/2016  Pat PatrickDorothy Murakami Mar 15, 1926 [id number]  Subjective:  Patient is a 83 year old female [facility] followiing a hospital stay for cellulitis. Patient states that she is making progress in rehab and states that she is beginning to transfer from her wheelchair to the commode independently. Patient agrees that she may need extra assistance and may ask her sister in law for assistance once discharged home.  Patient's son and grandson are also described as positive support.  Objective:   Encounter Medications:  Outpatient Encounter Medications as of 12/29/2016  Medication Sig  . acetaminophen (TYLENOL) 325 MG tablet Take 2 tablets (650 mg total) every 6 (six) hours as needed by mouth for mild pain (or Fever >/= 101).  Marland Kitchen. acidophilus (RISAQUAD) CAPS capsule Take 2 capsules 3 (three) times daily by mouth.  Marland Kitchen. amoxicillin-clavulanate (AUGMENTIN) 500-125 MG tablet Take 1 tablet (500 mg total) 3 (three) times daily by mouth.  Marland Kitchen. ascorbic acid (VITAMIN C) 100 MG tablet Take 1 tablet (100 mg total) by mouth daily.  . Aspirin-Caffeine 400-32 MG TABS Take 2 tablets by mouth daily.  . collagenase (SANTYL) ointment Apply daily topically.  . ferrous sulfate 325 (65 FE) MG tablet Take 1 tablet (325 mg total) by mouth daily.  . furosemide (LASIX) 20 MG tablet Take 1 tablet (20 mg total) by mouth daily as needed for edema.  . Multiple Vitamins-Minerals (MULTIVITAL) tablet Take 1 tablet by mouth daily.  . ondansetron (ZOFRAN) 4 MG tablet Take 1 tablet (4 mg total) every 6 (six) hours as needed by mouth for nausea.  Marland Kitchen. oxyCODONE (OXY IR/ROXICODONE) 5 MG immediate release tablet Take 1 tablet (5 mg total) every 4 (four) hours as needed by mouth for moderate pain.   No facility-administered encounter medications on file as of 12/29/2016.     Functional Status:  In your present state of health, do you have  any difficulty performing the following activities: 12/13/2016 11/25/2016  Hearing? N N  Vision? N N  Difficulty concentrating or making decisions? N N  Walking or climbing stairs? Y Y  Dressing or bathing? Y Y  Doing errands, shopping? - Y  Preparing Food and eating ? N -  Using the Toilet? N -  In the past six months, have you accidently leaked urine? N -  Do you have problems with loss of bowel control? N -  Managing your Medications? N -  Managing your Finances? N -  Housekeeping or managing your Housekeeping? Y -  Comment grandson helps with housekeeping and yard is mowed -  Some recent data might be hidden    Fall/Depression Screening:  PHQ 2/9 Scores 12/13/2016  PHQ - 2 Score 0    Assessment: Patient very friendly today, appeared to be in a positive  mood and is anxious to return home. This Child psychotherapistsocial worker spoke with OT who stated that patient continues to need a lot of assistance with standing. She frequently reports being in pain which limits her ability to participate in therapy.  Per OT, patient will need help upon her return home with bathing and toileting. He does not think that she will agree to long term care.  There is no discharge date in the near future due to patient's poor progress.  Patient's goal is to go from sitting to standing without assistance.  Plan: This social will continue to follow up with patient's progress while  in SNF.  Sheralyn Boatman [facility] Care Management [phone]

## 2017-01-01 DIAGNOSIS — L0390 Cellulitis, unspecified: Secondary | ICD-10-CM | POA: Diagnosis not present

## 2017-01-01 DIAGNOSIS — D649 Anemia, unspecified: Secondary | ICD-10-CM | POA: Diagnosis not present

## 2017-01-05 DIAGNOSIS — J209 Acute bronchitis, unspecified: Secondary | ICD-10-CM | POA: Diagnosis not present

## 2017-01-12 ENCOUNTER — Other Ambulatory Visit: Payer: Self-pay | Admitting: *Deleted

## 2017-01-12 NOTE — Patient Outreach (Signed)
[facility]N) Care Management  01/12/2017  [name] [name] 03/01/1926 [id number]  Phone call to patient's discharge planner [facility] to follow up on patient's progress in rehab. Per Synetta FailAnita, patient is still not able to walk independently. Her family continues to debate if patient should stay long term or not.  Synetta Failnita will contact this social worker once a decision is made.   Plan: This Child psychotherapistsocial worker will follow up within 1 week.    Adriana ReamsChrystal Georgios Kina, LCSW [facility] Care Management [phone]

## 2017-01-13 ENCOUNTER — Ambulatory Visit: Payer: Self-pay | Admitting: *Deleted

## 2017-01-19 ENCOUNTER — Other Ambulatory Visit: Payer: Self-pay | Admitting: *Deleted

## 2017-01-20 ENCOUNTER — Encounter: Payer: Self-pay | Admitting: *Deleted

## 2017-01-20 NOTE — Patient Outreach (Addendum)
[facility]) Care Management  [facility]  01/20/2017  Pat [name] [name] August 14, 1926 [id number]  Subjective:  Patient is a 84 year old female, [facility]. Per patient she would like to return home and is doing what she can in therapy to get stronger, however progress is slow.. Patient's son present during the visit and states that there is some concern regarding her return home as she is not able to stand without assistance. Per patient's son, patient lives alone however he and his son are available to assist with  Daily household needs. Per patient's son "she does everything in that wheelchair" " I am her son, I don't feel comfortable doing all of her care'. Patient went on to describe being uncomfortable with assisting patient with toileting and bathing.  Per patient's son he has a female cousin that may be willing to assist patient with care once she returns home.  Objective:   Encounter Medications:  Outpatient Encounter Medications as of 01/19/2017  Medication Sig  . acetaminophen (TYLENOL) 325 MG tablet Take 2 tablets (650 mg total) every 6 (six) hours as needed by mouth for mild pain (or Fever >/= 101).  Marland Kitchen. acidophilus (RISAQUAD) CAPS capsule Take 2 capsules 3 (three) times daily by mouth.  Marland Kitchen. amoxicillin-clavulanate (AUGMENTIN) 500-125 MG tablet Take 1 tablet (500 mg total) 3 (three) times daily by mouth.  Marland Kitchen. ascorbic acid (VITAMIN C) 100 MG tablet Take 1 tablet (100 mg total) by mouth daily.  . Aspirin-Caffeine 400-32 MG TABS Take 2 tablets by mouth daily.  . collagenase (SANTYL) ointment Apply daily topically.  . ferrous sulfate 325 (65 FE) MG tablet Take 1 tablet (325 mg total) by mouth daily.  . furosemide (LASIX) 20 MG tablet Take 1 tablet (20 mg total) by mouth daily as needed for edema.  . Multiple Vitamins-Minerals (MULTIVITAL) tablet Take 1 tablet by mouth daily.  . ondansetron (ZOFRAN) 4 MG tablet Take 1 tablet (4 mg total)  every 6 (six) hours as needed by mouth for nausea.  Marland Kitchen. oxyCODONE (OXY IR/ROXICODONE) 5 MG immediate release tablet Take 1 tablet (5 mg total) every 4 (four) hours as needed by mouth for moderate pain.   No facility-administered encounter medications on file as of 01/19/2017.     Functional Status:  In your present state of health, do you have any difficulty performing the following activities: 12/13/2016 11/25/2016  Hearing? N N  Vision? N N  Difficulty concentrating or making decisions? N N  Walking or climbing stairs? Y Y  Dressing or bathing? Y Y  Doing errands, shopping? - Y  Preparing Food and eating ? N -  Using the Toilet? N -  In the past six months, have you accidently leaked urine? N -  Do you have problems with loss of bowel control? N -  Managing your Medications? N -  Managing your Finances? N -  Housekeeping or managing your Housekeeping? Y -  Comment grandson helps with housekeeping and yard is mowed -  Some recent data might be hidden    Fall/Depression Screening:  PHQ 2/9 Scores 12/13/2016  PHQ - 2 Score 0    Assessment:  Per anita, the discharge planner, there is no discharge date set for patient due to her slow progress. Patient verbalizes a motivation to get stronger, and is adamant that she wants to return home. Patient's son concerned  about being able to manage her care if she returns home. ALF re-visited, however patient's  son will honor patient's choice to return home. Plan: This social worker to continue to follow patient progress in rehab.   Adriana Reams [facility] Care Management [phone]

## 2017-02-01 DIAGNOSIS — R5381 Other malaise: Secondary | ICD-10-CM | POA: Diagnosis not present

## 2017-02-01 DIAGNOSIS — L03116 Cellulitis of left lower limb: Secondary | ICD-10-CM | POA: Diagnosis not present

## 2017-02-01 DIAGNOSIS — D649 Anemia, unspecified: Secondary | ICD-10-CM | POA: Diagnosis not present

## 2017-02-03 ENCOUNTER — Other Ambulatory Visit: Payer: Self-pay | Admitting: *Deleted

## 2017-02-03 NOTE — Patient Outreach (Signed)
[facility]N) Care Management  02/03/2017  [name] PatrickDorothy [name] Sep 07, 1926 [id number]   Phone call to the [facility] planner [facility] to follow up on patient's progress in rehab. Per Synetta FailAnita, [facility], however the discharge planner did not know the name [facility]. Phone call made to [facility], discharge planning supervisor. Voicemail message left for a return call.  Plan: [facility] to be be [facility] care.    Adriana ReamsChrystal Land, LCSW [facility] Care Management [phone]

## 2017-02-06 NOTE — Addendum Note (Signed)
Addended by: Wenda OverlandLAND, Rakeem Colley M on: 02/06/2017 09:37 PM   Modules accepted: Orders

## 2017-02-07 ENCOUNTER — Other Ambulatory Visit: Payer: Self-pay | Admitting: *Deleted

## 2017-02-07 NOTE — Patient Outreach (Signed)
[facility]N) Care Management  02/07/2017  [name] PatrickDorothy [name] Apr 18, 1926 [id number]   Phone call to patient to follow up on community resource needs following [facility].  Patient's phone rang, however there was no voicemail availability.  This social worker will call patient back on 02/08/17.   Adriana ReamsChrystal Shanon Becvar, LCSW [facility] Care Management [phone]

## 2017-02-07 NOTE — Progress Notes (Signed)
This encounter was created in error - please disregard.

## 2017-02-07 NOTE — Addendum Note (Signed)
Addended by: Marlow BaarsELKINS, Rosabell Geyer L on: 02/07/2017 10:36 AM   Modules accepted: Level of Service, SmartSet

## 2017-02-08 ENCOUNTER — Encounter: Payer: Self-pay | Admitting: *Deleted

## 2017-02-08 ENCOUNTER — Other Ambulatory Visit: Payer: Self-pay | Admitting: *Deleted

## 2017-02-08 NOTE — Patient Outreach (Signed)
02/08/17  11:52 am- Unsuccessful telephone encounter to Pat Patrickorothy Matsuo, 84 year old female- follow up on referral received 02/07/17 from  [facility] for Community CM services/transition of care/recent SNF [facility] discharge on 02/03/17.    Pt admitted to rehab after recent  hospitalization November 15-19,2019 for cellulitis of left lower extremity.  Pt's history includes  but not limited to elevated troponin, Acute kidney injury.  Unable to leave a voice message as phone kept ringing.    11:54 am-  Unsuccessful telephone encounter to son Lavera GuiseJerry Ghattas (per [facility] has verbal consent to talk with  Son) follow up on pt's recent SNF discharge.   HIPAA compliant voice message left with RN CM's contact information.   Plan:  If no response to voice message left, plan to follow up again tomorrow.    Shayne Alkenose M.   Pierzchala RN CCM [facility] Care Management  [phone]

## 2017-02-08 NOTE — Patient Outreach (Signed)
[facility]N) Care Management  02/08/2017  [name] PatrickDorothy [name] [birth year] [id number]   Phone call to patient to follow up with her following her SNF stay.  Patient did not answer. Phone call to patient's son, who states that patient is at home, however may not be answering the phone. This Child psychotherapistsocial worker discussed the importance of patient answering the phone for [name] and HH follow up.  Discussed home visit for tomorrow, Patient's son agrees with home visit and will inform patient.  Plan Home visit 02/09/17    Adriana ReamsChrystal Rhianne Soman, LCSW [facility] Care Management [phone]

## 2017-02-09 ENCOUNTER — Other Ambulatory Visit: Payer: Self-pay | Admitting: *Deleted

## 2017-02-09 DIAGNOSIS — D638 Anemia in other chronic diseases classified elsewhere: Secondary | ICD-10-CM | POA: Diagnosis not present

## 2017-02-09 DIAGNOSIS — L97923 Non-pressure chronic ulcer of unspecified part of left lower leg with necrosis of muscle: Secondary | ICD-10-CM | POA: Diagnosis not present

## 2017-02-09 DIAGNOSIS — M6281 Muscle weakness (generalized): Secondary | ICD-10-CM | POA: Diagnosis not present

## 2017-02-09 DIAGNOSIS — R2689 Other abnormalities of gait and mobility: Secondary | ICD-10-CM | POA: Diagnosis not present

## 2017-02-09 NOTE — Patient Outreach (Signed)
[facility]) Care Management  [facility]  02/09/2017  [name] PatrickDorothy [name] 1926/08/27 [id number]  Subjective:  Patient is a 84 year old female, [facility]. Multiple attempts made to reach patient by phone, however patient could not be reached. Phone call to patient's son, who confirmed appointment for today, stating that patient is home but she does not answer the phone. Patient confirmed that she heard the phone ring but she just did not answer the phone. [facility] and feels [facility] right now. [facility] nurse dressed her leg today and scheduled a follow up appointment with her primary care doctor.  Patient states that her  grandson and son visit daily to assist with her daily needs.  Patient reports being able to complete her own ADL's, patient has cut back on cooking a lot, grandson brings in food many times. Patient denied having any community resource needs at this time and would not sign the [facility] consent. Per patient, [facility] is enough services for right now.  Objective:   Encounter Medications:  Outpatient Encounter Medications as of 02/09/2017  Medication Sig  . acetaminophen (TYLENOL) 325 MG tablet Take 2 tablets (650 mg total) every 6 (six) hours as needed by mouth for mild pain (or Fever >/= 101).  Marland Kitchen. acidophilus (RISAQUAD) CAPS capsule Take 2 capsules 3 (three) times daily by mouth.  Marland Kitchen. amoxicillin-clavulanate (AUGMENTIN) 500-125 MG tablet Take 1 tablet (500 mg total) 3 (three) times daily by mouth.  Marland Kitchen. ascorbic acid (VITAMIN C) 100 MG tablet Take 1 tablet (100 mg total) by mouth daily.  . Aspirin-Caffeine 400-32 MG TABS Take 2 tablets by mouth daily.  . collagenase (SANTYL) ointment Apply daily topically.  . ferrous sulfate 325 (65 FE) MG tablet Take 1 tablet (325 mg total) by mouth daily.  . furosemide (LASIX) 20 MG tablet Take 1 tablet (20 mg total) by mouth daily as needed for  edema.  . Multiple Vitamins-Minerals (MULTIVITAL) tablet Take 1 tablet by mouth daily.  . ondansetron (ZOFRAN) 4 MG tablet Take 1 tablet (4 mg total) every 6 (six) hours as needed by mouth for nausea.  Marland Kitchen. oxyCODONE (OXY IR/ROXICODONE) 5 MG immediate release tablet Take 1 tablet (5 mg total) every 4 (four) hours as needed by mouth for moderate pain.   No facility-administered encounter medications on file as of 02/09/2017.     Functional Status:  In your present state of health, do you have any difficulty performing the following activities: 12/13/2016 11/25/2016  Hearing? N N  Vision? N N  Difficulty concentrating or making decisions? N N  Walking or climbing stairs? Y Y  Dressing or bathing? Y Y  Doing errands, shopping? - Y  Preparing Food and eating ? N -  Using the Toilet? N -  In the past six months, have you accidently leaked urine? N -  Do you have problems with loss of bowel control? N -  Managing your Medications? N -  Managing your Finances? N -  Housekeeping or managing your Housekeeping? Y -  Comment grandson helps with housekeeping and yard is mowed -  Some recent data might be hidden    Fall/Depression Screening:  PHQ 2/9 Scores 12/13/2016  PHQ - 2 Score 0    Assessment:  Patient pleasant during the visit but did not seem to understand the difference between [facility] and [facility] care management and feels that she does not need both services.  Although patient provided verbal consent for [facility] care management while in the SNF, she is refusing [facility] care management services at this time and will not sign a [facility] consent form.  Plan: Patient to be closed to [facility] care management at this time. This social worker's contact information provided if there are needs that arise in the future.   Adriana Reams [facility] Care Management [phone]

## 2017-02-10 ENCOUNTER — Other Ambulatory Visit: Payer: Self-pay | Admitting: *Deleted

## 2017-02-10 NOTE — Patient Outreach (Signed)
02/09/17-    Spoke with  Chrystal [facility] LCSW,  currently at pt's home for visit, requested to speak with pt due to unable to  Contact her yesterday/phone kept ringing, phone handed to pt.  Pt verified HIPAA identifiers with RN CM, discussed  With pt following up on recent SNF discharge (02/03/17), work with Chrystal who is currently at her home, to  Follow for transition of care.    Pt admitted to  rehab after hospitalization November 11-15,2018 for left lower leg cellulitis.  Pt reports HH RN has been here to  hange her  dressing (left lower leg), wound doing better..   Pt reports to follow up with PCP next week, taking all  of her medications.  Pt reports does not understand why RN CM needs To follow up with her since just answered all of these questions, signed up  to which tried to explain to pt Chrystal LCSW and RN CM provide different services.  RN CM discussed with pt calling her again tomorrow  To complete transition of care to which pt agreed.   Plan:  RN CM to follow up again tomorrow telephonically to complete transition of care.   Addendum:  Spoke with Chrystal [facility] LCSW at a later time, was informed pt would not sign [facility]  Consent during home visit, could not understand difference between Home health and [facility] services. Chrystal to follow up with son telephonically.    Shayne Alkenose M.   Pierzchala RN CCM [facility] Care Management  [phone]

## 2017-02-10 NOTE — Patient Outreach (Signed)
Unsuccessful telephone encounter to [name] [name], 84 year old female to complete transition of care started with pt yesterday.  RN CM [facility] for Community CM services/[facility]17.   Pt admitted to  Rehab after November 11-15,2018 in patient stay.   Unable to leave a voice message as phone kept Ringing.      Plan:  RN CM to follow up again next week/next business day.   [name] [name] M.   [name] Hallinan RN CCM [facility] Care Management  [phone]

## 2017-02-13 ENCOUNTER — Ambulatory Visit: Payer: Self-pay | Admitting: *Deleted

## 2017-02-13 DIAGNOSIS — D638 Anemia in other chronic diseases classified elsewhere: Secondary | ICD-10-CM | POA: Diagnosis not present

## 2017-02-13 DIAGNOSIS — M6281 Muscle weakness (generalized): Secondary | ICD-10-CM | POA: Diagnosis not present

## 2017-02-13 DIAGNOSIS — L97923 Non-pressure chronic ulcer of unspecified part of left lower leg with necrosis of muscle: Secondary | ICD-10-CM | POA: Diagnosis not present

## 2017-02-13 DIAGNOSIS — R2689 Other abnormalities of gait and mobility: Secondary | ICD-10-CM | POA: Diagnosis not present

## 2017-02-14 DIAGNOSIS — R2689 Other abnormalities of gait and mobility: Secondary | ICD-10-CM | POA: Diagnosis not present

## 2017-02-14 DIAGNOSIS — M6281 Muscle weakness (generalized): Secondary | ICD-10-CM | POA: Diagnosis not present

## 2017-02-14 DIAGNOSIS — L97923 Non-pressure chronic ulcer of unspecified part of left lower leg with necrosis of muscle: Secondary | ICD-10-CM | POA: Diagnosis not present

## 2017-02-14 DIAGNOSIS — D638 Anemia in other chronic diseases classified elsewhere: Secondary | ICD-10-CM | POA: Diagnosis not present

## 2017-02-16 DIAGNOSIS — L97923 Non-pressure chronic ulcer of unspecified part of left lower leg with necrosis of muscle: Secondary | ICD-10-CM | POA: Diagnosis not present

## 2017-02-16 DIAGNOSIS — M6281 Muscle weakness (generalized): Secondary | ICD-10-CM | POA: Diagnosis not present

## 2017-02-16 DIAGNOSIS — D638 Anemia in other chronic diseases classified elsewhere: Secondary | ICD-10-CM | POA: Diagnosis not present

## 2017-02-16 DIAGNOSIS — R2689 Other abnormalities of gait and mobility: Secondary | ICD-10-CM | POA: Diagnosis not present

## 2017-02-17 DIAGNOSIS — R2689 Other abnormalities of gait and mobility: Secondary | ICD-10-CM | POA: Diagnosis not present

## 2017-02-17 DIAGNOSIS — D638 Anemia in other chronic diseases classified elsewhere: Secondary | ICD-10-CM | POA: Diagnosis not present

## 2017-02-17 DIAGNOSIS — M6281 Muscle weakness (generalized): Secondary | ICD-10-CM | POA: Diagnosis not present

## 2017-02-17 DIAGNOSIS — L97923 Non-pressure chronic ulcer of unspecified part of left lower leg with necrosis of muscle: Secondary | ICD-10-CM | POA: Diagnosis not present

## 2017-02-20 ENCOUNTER — Encounter: Payer: Self-pay | Admitting: *Deleted

## 2017-02-20 DIAGNOSIS — R2689 Other abnormalities of gait and mobility: Secondary | ICD-10-CM | POA: Diagnosis not present

## 2017-02-20 DIAGNOSIS — L97923 Non-pressure chronic ulcer of unspecified part of left lower leg with necrosis of muscle: Secondary | ICD-10-CM | POA: Diagnosis not present

## 2017-02-20 DIAGNOSIS — M6281 Muscle weakness (generalized): Secondary | ICD-10-CM | POA: Diagnosis not present

## 2017-02-20 DIAGNOSIS — D638 Anemia in other chronic diseases classified elsewhere: Secondary | ICD-10-CM | POA: Diagnosis not present

## 2017-02-22 DIAGNOSIS — D638 Anemia in other chronic diseases classified elsewhere: Secondary | ICD-10-CM | POA: Diagnosis not present

## 2017-02-22 DIAGNOSIS — M6281 Muscle weakness (generalized): Secondary | ICD-10-CM | POA: Diagnosis not present

## 2017-02-22 DIAGNOSIS — R2689 Other abnormalities of gait and mobility: Secondary | ICD-10-CM | POA: Diagnosis not present

## 2017-02-22 DIAGNOSIS — L97923 Non-pressure chronic ulcer of unspecified part of left lower leg with necrosis of muscle: Secondary | ICD-10-CM | POA: Diagnosis not present

## 2017-02-23 ENCOUNTER — Other Ambulatory Visit: Payer: Self-pay | Admitting: *Deleted

## 2017-02-23 NOTE — Patient Outreach (Signed)
Addendum:  Late entry for documentation of case closure for this RN CM  as  Chrystal [facility] LCSW notified  [facility] CMA and PCP 02/20/17 that pt decided to decline services.      Shayne Alkenose M.   Lee-Anne Flicker RN CCM [facility] Care Management  [phone]

## 2017-02-24 DIAGNOSIS — D638 Anemia in other chronic diseases classified elsewhere: Secondary | ICD-10-CM | POA: Diagnosis not present

## 2017-02-24 DIAGNOSIS — R2689 Other abnormalities of gait and mobility: Secondary | ICD-10-CM | POA: Diagnosis not present

## 2017-02-24 DIAGNOSIS — M6281 Muscle weakness (generalized): Secondary | ICD-10-CM | POA: Diagnosis not present

## 2017-02-24 DIAGNOSIS — L97923 Non-pressure chronic ulcer of unspecified part of left lower leg with necrosis of muscle: Secondary | ICD-10-CM | POA: Diagnosis not present

## 2017-02-27 DIAGNOSIS — L97923 Non-pressure chronic ulcer of unspecified part of left lower leg with necrosis of muscle: Secondary | ICD-10-CM | POA: Diagnosis not present

## 2017-02-27 DIAGNOSIS — M6281 Muscle weakness (generalized): Secondary | ICD-10-CM | POA: Diagnosis not present

## 2017-02-27 DIAGNOSIS — R2689 Other abnormalities of gait and mobility: Secondary | ICD-10-CM | POA: Diagnosis not present

## 2017-02-27 DIAGNOSIS — D638 Anemia in other chronic diseases classified elsewhere: Secondary | ICD-10-CM | POA: Diagnosis not present

## 2017-03-02 DIAGNOSIS — L97923 Non-pressure chronic ulcer of unspecified part of left lower leg with necrosis of muscle: Secondary | ICD-10-CM | POA: Diagnosis not present

## 2017-03-02 DIAGNOSIS — M6281 Muscle weakness (generalized): Secondary | ICD-10-CM | POA: Diagnosis not present

## 2017-03-02 DIAGNOSIS — D638 Anemia in other chronic diseases classified elsewhere: Secondary | ICD-10-CM | POA: Diagnosis not present

## 2017-03-02 DIAGNOSIS — R2689 Other abnormalities of gait and mobility: Secondary | ICD-10-CM | POA: Diagnosis not present

## 2017-03-03 DIAGNOSIS — D638 Anemia in other chronic diseases classified elsewhere: Secondary | ICD-10-CM | POA: Diagnosis not present

## 2017-03-03 DIAGNOSIS — L97923 Non-pressure chronic ulcer of unspecified part of left lower leg with necrosis of muscle: Secondary | ICD-10-CM | POA: Diagnosis not present

## 2017-03-03 DIAGNOSIS — R2689 Other abnormalities of gait and mobility: Secondary | ICD-10-CM | POA: Diagnosis not present

## 2017-03-03 DIAGNOSIS — M6281 Muscle weakness (generalized): Secondary | ICD-10-CM | POA: Diagnosis not present

## 2017-03-06 ENCOUNTER — Encounter: Payer: Self-pay | Admitting: Emergency Medicine

## 2017-03-06 ENCOUNTER — Inpatient Hospital Stay
Admission: EM | Admit: 2017-03-06 | Discharge: 2017-03-09 | DRG: 603 | Disposition: A | Discharge status: Skilled Nursing Facility | Payer: Medicare HMO | Attending: Specialist | Admitting: Specialist

## 2017-03-06 ENCOUNTER — Other Ambulatory Visit: Payer: Self-pay

## 2017-03-06 ENCOUNTER — Emergency Department: Payer: Medicare HMO

## 2017-03-06 DIAGNOSIS — Z9119 Patient's noncompliance with other medical treatment and regimen: Secondary | ICD-10-CM

## 2017-03-06 DIAGNOSIS — M6281 Muscle weakness (generalized): Secondary | ICD-10-CM | POA: Diagnosis not present

## 2017-03-06 DIAGNOSIS — N179 Acute kidney failure, unspecified: Secondary | ICD-10-CM | POA: Diagnosis not present

## 2017-03-06 DIAGNOSIS — E669 Obesity, unspecified: Secondary | ICD-10-CM | POA: Diagnosis not present

## 2017-03-06 DIAGNOSIS — R54 Age-related physical debility: Secondary | ICD-10-CM | POA: Diagnosis not present

## 2017-03-06 DIAGNOSIS — L0390 Cellulitis, unspecified: Secondary | ICD-10-CM | POA: Diagnosis present

## 2017-03-06 DIAGNOSIS — I890 Lymphedema, not elsewhere classified: Secondary | ICD-10-CM | POA: Diagnosis not present

## 2017-03-06 DIAGNOSIS — Z6830 Body mass index (BMI) 30.0-30.9, adult: Secondary | ICD-10-CM | POA: Diagnosis not present

## 2017-03-06 DIAGNOSIS — L97929 Non-pressure chronic ulcer of unspecified part of left lower leg with unspecified severity: Secondary | ICD-10-CM | POA: Diagnosis not present

## 2017-03-06 DIAGNOSIS — Z79899 Other long term (current) drug therapy: Secondary | ICD-10-CM

## 2017-03-06 DIAGNOSIS — R4182 Altered mental status, unspecified: Secondary | ICD-10-CM | POA: Diagnosis not present

## 2017-03-06 DIAGNOSIS — E875 Hyperkalemia: Secondary | ICD-10-CM | POA: Diagnosis not present

## 2017-03-06 DIAGNOSIS — L03116 Cellulitis of left lower limb: Principal | ICD-10-CM | POA: Diagnosis present

## 2017-03-06 DIAGNOSIS — D638 Anemia in other chronic diseases classified elsewhere: Secondary | ICD-10-CM | POA: Diagnosis not present

## 2017-03-06 DIAGNOSIS — D649 Anemia, unspecified: Secondary | ICD-10-CM | POA: Diagnosis not present

## 2017-03-06 DIAGNOSIS — Z8249 Family history of ischemic heart disease and other diseases of the circulatory system: Secondary | ICD-10-CM | POA: Diagnosis not present

## 2017-03-06 DIAGNOSIS — E876 Hypokalemia: Secondary | ICD-10-CM | POA: Diagnosis not present

## 2017-03-06 DIAGNOSIS — R262 Difficulty in walking, not elsewhere classified: Secondary | ICD-10-CM | POA: Diagnosis not present

## 2017-03-06 DIAGNOSIS — R600 Localized edema: Secondary | ICD-10-CM | POA: Diagnosis not present

## 2017-03-06 DIAGNOSIS — I10 Essential (primary) hypertension: Secondary | ICD-10-CM | POA: Diagnosis present

## 2017-03-06 DIAGNOSIS — Z7401 Bed confinement status: Secondary | ICD-10-CM | POA: Diagnosis not present

## 2017-03-06 DIAGNOSIS — R41841 Cognitive communication deficit: Secondary | ICD-10-CM | POA: Diagnosis not present

## 2017-03-06 LAB — CBC WITH DIFFERENTIAL/PLATELET
BASOS ABS: 0.1 10*3/uL (ref 0–0.1)
BASOS PCT: 1 %
EOS ABS: 0.5 10*3/uL (ref 0–0.7)
EOS PCT: 12 %
HEMATOCRIT: 30 % — AB (ref 35.0–47.0)
Hemoglobin: 9.9 g/dL — ABNORMAL LOW (ref 12.0–16.0)
Lymphocytes Relative: 30 %
Lymphs Abs: 1.3 10*3/uL (ref 1.0–3.6)
MCH: 29.4 pg (ref 26.0–34.0)
MCHC: 33.1 g/dL (ref 32.0–36.0)
MCV: 88.6 fL (ref 80.0–100.0)
MONO ABS: 0.3 10*3/uL (ref 0.2–0.9)
Monocytes Relative: 8 %
NEUTROS ABS: 2.2 10*3/uL (ref 1.4–6.5)
Neutrophils Relative %: 49 %
PLATELETS: 239 10*3/uL (ref 150–440)
RBC: 3.38 MIL/uL — ABNORMAL LOW (ref 3.80–5.20)
RDW: 17.1 % — AB (ref 11.5–14.5)
WBC: 4.5 10*3/uL (ref 3.6–11.0)

## 2017-03-06 LAB — COMPREHENSIVE METABOLIC PANEL
ALBUMIN: 4.4 g/dL (ref 3.5–5.0)
ALT: 8 U/L — ABNORMAL LOW (ref 14–54)
ANION GAP: 8 (ref 5–15)
AST: 31 U/L (ref 15–41)
Alkaline Phosphatase: 64 U/L (ref 38–126)
BUN: 47 mg/dL — AB (ref 6–20)
CO2: 25 mmol/L (ref 22–32)
Calcium: 9.5 mg/dL (ref 8.9–10.3)
Chloride: 104 mmol/L (ref 101–111)
Creatinine, Ser: 1.38 mg/dL — ABNORMAL HIGH (ref 0.44–1.00)
GFR calc Af Amer: 38 mL/min — ABNORMAL LOW (ref >60)
GFR calc non Af Amer: 33 mL/min — ABNORMAL LOW (ref >60)
GLUCOSE: 115 mg/dL — AB (ref 65–99)
POTASSIUM: 5.3 mmol/L — AB (ref 3.5–5.1)
SODIUM: 137 mmol/L (ref 135–145)
Total Bilirubin: 0.9 mg/dL (ref 0.3–1.2)
Total Protein: 8.4 g/dL — ABNORMAL HIGH (ref 6.5–8.1)

## 2017-03-06 MED ORDER — PIPERACILLIN-TAZOBACTAM 3.375 G IVPB
3.3750 g | Freq: Three times a day (TID) | INTRAVENOUS | Status: DC
  Administered 2017-03-06 – 2017-03-09 (×8): 3.375 g via INTRAVENOUS
  Filled 2017-03-06 (×8): qty 50

## 2017-03-06 MED ORDER — ACETAMINOPHEN 650 MG RE SUPP: 650.0000 mg | Freq: Four times a day (QID) | RECTAL | Status: DC | PRN

## 2017-03-06 MED ORDER — SODIUM CHLORIDE 0.9% FLUSH
3.0000 mL | Freq: Two times a day (BID) | INTRAVENOUS | Status: DC
  Administered 2017-03-07 – 2017-03-09 (×3): 3 mL via INTRAVENOUS

## 2017-03-06 MED ORDER — ONDANSETRON HCL 4 MG PO TABS: 4.0000 mg | ORAL_TABLET | Freq: Four times a day (QID) | ORAL | Status: DC | PRN

## 2017-03-06 MED ORDER — VANCOMYCIN HCL IN DEXTROSE 1-5 GM/200ML-% IV SOLN
1000.0000 mg | INTRAVENOUS | Status: DC
  Administered 2017-03-06 – 2017-03-08 (×3): 1000 mg via INTRAVENOUS
  Filled 2017-03-06 (×4): qty 200

## 2017-03-06 MED ORDER — BISACODYL 10 MG RE SUPP: 10.0000 mg | Freq: Every day | RECTAL | Status: DC | PRN

## 2017-03-06 MED ORDER — HYDROCODONE-ACETAMINOPHEN 5-325 MG PO TABS: 1.0000 | ORAL_TABLET | ORAL | Status: DC | PRN

## 2017-03-06 MED ORDER — FERROUS SULFATE 325 (65 FE) MG PO TABS
325.0000 mg | ORAL_TABLET | Freq: Every day | ORAL | Status: DC
  Administered 2017-03-07 – 2017-03-09 (×3): 325 mg via ORAL
  Filled 2017-03-06 (×3): qty 1

## 2017-03-06 MED ORDER — PIPERACILLIN-TAZOBACTAM 3.375 G IVPB 30 MIN: 3.3750 g | Freq: Four times a day (QID) | INTRAVENOUS | Status: DC

## 2017-03-06 MED ORDER — DOCUSATE SODIUM 100 MG PO CAPS
100.0000 mg | ORAL_CAPSULE | Freq: Two times a day (BID) | ORAL | Status: DC
  Administered 2017-03-06 – 2017-03-09 (×6): 100 mg via ORAL
  Filled 2017-03-06 (×6): qty 1

## 2017-03-06 MED ORDER — ADULT MULTIVITAMIN W/MINERALS CH
1.0000 | ORAL_TABLET | Freq: Every day | ORAL | Status: DC
  Administered 2017-03-07 – 2017-03-09 (×3): 1 via ORAL
  Filled 2017-03-06 (×3): qty 1

## 2017-03-06 MED ORDER — METOLAZONE 5 MG PO TABS
5.0000 mg | ORAL_TABLET | Freq: Once | ORAL | Status: AC
  Administered 2017-03-06: 5 mg via ORAL
  Filled 2017-03-06: qty 1

## 2017-03-06 MED ORDER — HEPARIN SODIUM (PORCINE) 5000 UNIT/ML IJ SOLN
5000.0000 [IU] | Freq: Three times a day (TID) | INTRAMUSCULAR | Status: DC
  Administered 2017-03-06 – 2017-03-09 (×8): 5000 [IU] via SUBCUTANEOUS
  Filled 2017-03-06 (×8): qty 1

## 2017-03-06 MED ORDER — SODIUM CHLORIDE 0.9 % IV SOLN: 250.0000 mL | INTRAVENOUS | Status: DC | PRN

## 2017-03-06 MED ORDER — PIPERACILLIN-TAZOBACTAM 3.375 G IVPB: 3.3750 g | Freq: Three times a day (TID) | INTRAVENOUS | Status: DC

## 2017-03-06 MED ORDER — RISAQUAD PO CAPS
2.0000 | ORAL_CAPSULE | Freq: Three times a day (TID) | ORAL | Status: DC
  Administered 2017-03-06 – 2017-03-09 (×8): 2 via ORAL
  Filled 2017-03-06 (×10): qty 2

## 2017-03-06 MED ORDER — ACETAMINOPHEN 325 MG PO TABS: 650.0000 mg | ORAL_TABLET | Freq: Four times a day (QID) | ORAL | Status: DC | PRN

## 2017-03-06 MED ORDER — POLYETHYLENE GLYCOL 3350 17 G PO PACK: 17.0000 g | PACK | Freq: Every day | ORAL | Status: DC | PRN

## 2017-03-06 MED ORDER — SODIUM CHLORIDE 0.9% FLUSH: 3.0000 mL | INTRAVENOUS | Status: DC | PRN

## 2017-03-06 MED ORDER — FUROSEMIDE 20 MG PO TABS
20.0000 mg | ORAL_TABLET | Freq: Two times a day (BID) | ORAL | Status: DC
  Administered 2017-03-07 – 2017-03-09 (×3): 20 mg via ORAL
  Filled 2017-03-06 (×5): qty 1

## 2017-03-06 MED ORDER — ONDANSETRON HCL 4 MG/2ML IJ SOLN: 4.0000 mg | Freq: Four times a day (QID) | INTRAMUSCULAR | Status: DC | PRN

## 2017-03-06 MED ORDER — ASCORBIC ACID 100 MG PO TABS
100.0000 mg | ORAL_TABLET | Freq: Every day | ORAL | Status: DC
  Administered 2017-03-07: 100 mg via ORAL
  Filled 2017-03-06 (×2): qty 1

## 2017-03-06 MED ORDER — FLEET ENEMA 7-19 GM/118ML RE ENEM: 1.0000 | ENEMA | Freq: Once | RECTAL | Status: DC | PRN

## 2017-03-06 MED ORDER — VANCOMYCIN HCL IN DEXTROSE 1-5 GM/200ML-% IV SOLN
1000.0000 mg | INTRAVENOUS | Status: AC
  Administered 2017-03-06: 1000 mg via INTRAVENOUS
  Filled 2017-03-06: qty 200

## 2017-03-06 MED ORDER — SODIUM CHLORIDE 0.9 % IV BOLUS (SEPSIS)
500.0000 mL | Freq: Once | INTRAVENOUS | Status: AC
  Administered 2017-03-06: 500 mL via INTRAVENOUS

## 2017-03-06 NOTE — ED Triage Notes (Signed)
L leg pain began one week ago. Both legs swollen x years. Noted L leg more painful and open sores past week. History of cellulitis both legs.

## 2017-03-06 NOTE — ED Provider Notes (Signed)
[facility] Emergency Department Provider Note   ____________________________________________   First MD Initiated Contact with Patient 03/06/17 1558     (approximate)  I have reviewed the triage vital signs and the nursing notes.   HISTORY  Chief Complaint Leg Pain  HPI [name] [name] is a 84 y.o. female presents for evaluation for left lower leg swelling and redness.  Patient reports that she recently left rehab after severe cellulitis and chronic infection in the left leg.  Also some of the right previous.  Reports she is noticed for the last 3 days increasing pain and discomfort and swelling in the left lower leg.  Open weeping wounds, and redness tenderness to the touch making it hard for her to walk due to the discomfort.  She usually walks with a walker.  No chest pain or trouble breathing.  Grandson reports not eating and drinking as much last couple of days possibly dehydrated.  No nausea or vomiting.  They are hoping to follow-up with the wound center, but could not get in.  There advised to go to the urgent care or emergency room today for further evaluation   Past Medical History:  Diagnosis Date  . Cellulitis 10/28/2016   bilateral lower extremities.     Patient Active Problem List   Diagnosis Date Noted  . Left leg cellulitis 11/25/2016  . AKI (acute kidney injury) (HCC) 11/05/2016  . Pressure injury of skin 11/05/2016  . Elevated troponin 11/04/2016    Past Surgical History:  Procedure Laterality Date  . ABDOMINAL HYSTERECTOMY    . GANGLION CYST EXCISION      Prior to Admission medications   Medication Sig Start Date End Date Taking? Authorizing Provider  acetaminophen (TYLENOL) 325 MG tablet Take 2 tablets (650 mg total) every 6 (six) hours as needed by mouth for mild pain (or Fever >/= 101). 11/28/16   Ramonita Lab, MD  acidophilus (RISAQUAD) CAPS capsule Take 2 capsules 3 (three) times daily by mouth. 11/28/16   Gouru, Deanna Artis,  MD  amoxicillin-clavulanate (AUGMENTIN) 500-125 MG tablet Take 1 tablet (500 mg total) 3 (three) times daily by mouth. 11/28/16   Gouru, Deanna Artis, MD  ascorbic acid (VITAMIN C) 100 MG tablet Take 1 tablet (100 mg total) by mouth daily. 11/07/16   Enedina Finner, MD  Aspirin-Caffeine 400-32 MG TABS Take 2 tablets by mouth daily.    [provider]  collagenase (SANTYL) ointment Apply daily topically. 11/28/16   Ramonita Lab, MD  ferrous sulfate 325 (65 FE) MG tablet Take 1 tablet (325 mg total) by mouth daily. 11/07/16   Enedina Finner, MD  furosemide (LASIX) 20 MG tablet Take 1 tablet (20 mg total) by mouth daily as needed for edema. 11/07/16 11/07/17  Enedina Finner, MD  Multiple Vitamins-Minerals (MULTIVITAL) tablet Take 1 tablet by mouth daily. 11/07/16   Enedina Finner, MD  ondansetron (ZOFRAN) 4 MG tablet Take 1 tablet (4 mg total) every 6 (six) hours as needed by mouth for nausea. 11/28/16   Gouru, Deanna Artis, MD  oxyCODONE (OXY IR/ROXICODONE) 5 MG immediate release tablet Take 1 tablet (5 mg total) every 4 (four) hours as needed by mouth for moderate pain. 11/28/16   Ramonita Lab, MD    Allergies Patient has no known allergies.  Family History  Problem Relation Age of Onset  . Diabetes Mellitus II Mother   . Heart disease Father     Social History Social History   Tobacco Use  . Smoking status: Never Smoker  .  Smokeless tobacco: Never Used  Substance Use Topics  . Alcohol use: No  . Drug use: No    Review of Systems Constitutional: No fever/chills Eyes: No visual changes. ENT: No sore throat. Cardiovascular: Denies chest pain. Respiratory: Denies shortness of breath. Gastrointestinal: No abdominal pain.  No nausea, no vomiting.  No diarrhea.  No constipation. Genitourinary: Negative for dysuria. Musculoskeletal: See HPI regarding lower extremity edema Skin: Negative for rash. Neurological: Negative for headaches, focal weakness or  numbness.    ____________________________________________   PHYSICAL EXAM:  VITAL SIGNS: ED Triage Vitals [03/06/17 1343]  Enc Vitals Group     BP (!) 182/78     Pulse Rate 87     Resp 18     Temp 97.8 F (36.6 C)     Temp Source Oral     SpO2 99 %     Weight 200 lb (90.7 kg)     Height 5\' 2"  (1.575 m)     Head Circumference      Peak Flow      Pain Score 10     Pain Loc      Pain Edu?      Excl. in GC?     Constitutional: Alert and oriented. Well appearing and in no acute distress.  Somewhat frail in appearance. Eyes: Conjunctivae are normal. Head: Atraumatic. Nose: No congestion/rhinnorhea. Mouth/Throat: Mucous membranes are moist. Neck: No stridor.   Cardiovascular: Normal rate, regular rhythm. Grossly normal heart sounds.  Good peripheral circulation. Respiratory: Normal respiratory effort.  No retractions. Lungs CTAB. Gastrointestinal: Soft and nontender. No distention. Musculoskeletal: Bilateral lower extremity lymphedema.  Fairly significant bilaterally, but slightly worse on the left in the left from about the tibial plateau down to the ankle is erythematous, tender to the touch, there is a weeping wound anteriorly with a likely venous or poorly healing ulcer over the left anterior region that is draining clear fluid.  The left lower leg is erythematous, warm to the touch.  Right lower extremity demonstrates lymphedema, but no erythema or redness.  Normal capillary refill in the toes bilaterally.  The back of both legs do not demonstrate any ulcers. Neurologic:  Normal speech and language. No gross focal neurologic deficits are appreciated that she has difficulty raising both legs because they are so heavy from the edema.  No deficits detected.  Moves both extremities without difficulty.  Equal facial expressions.  Skin:  Skin is warm, dry and intact. No rash noted. Psychiatric: Mood and affect are normal. Speech and behavior are  normal.  ____________________________________________   LABS (all labs ordered are listed, but only abnormal results are displayed)  Labs Reviewed  CBC WITH DIFFERENTIAL/PLATELET - Abnormal; Notable for the following components:      Result Value   RBC 3.38 (*)    Hemoglobin 9.9 (*)    HCT 30.0 (*)    RDW 17.1 (*)    All other components within normal limits  COMPREHENSIVE METABOLIC PANEL - Abnormal; Notable for the following components:   Potassium 5.3 (*)    Glucose, Bld 115 (*)    BUN 47 (*)    Creatinine, Ser 1.38 (*)    Total Protein 8.4 (*)    ALT 8 (*)    GFR calc non Af Amer 33 (*)    GFR calc Af Amer 38 (*)    All other components within normal limits  CULTURE, BLOOD (ROUTINE X 2)  CULTURE, BLOOD (ROUTINE X 2)   ____________________________________________  EKG  ____________________________________________  RADIOLOGY    Ultrasound result reviewed, negative for DVT in the left lower extremity ____________________________________________   PROCEDURES  Procedure(s) performed: None  Procedures  Critical Care performed: No  ____________________________________________   INITIAL IMPRESSION / ASSESSMENT AND PLAN / ED COURSE  Pertinent labs & imaging results that were available during my care of the patient were reviewed by me and considered in my medical decision making (see chart for details).  Patient returns for evaluation of left lower extremity erythema redness and edema.  Notable lymphedema, the left lower extremity appears superinfected with cellulitis.  No evidence of acute abscesses noted on exam.  We will send the patient for an ultrasound of the left lower extremity to exclude DVT.  Patient's labs indicate acute kidney injury, likely secondary to poor oral intake in history taking and examination.  We will hydrate lightly, vancomycin ordered by pharmacy consult.  Blood cultures drawn, patient will be admitted to the hospital for further workup  due to severe left lower extremity cellulitis, ulcer formation, and acute kidney injury.  Patient and her grandson at the bedside both agreeable with plan for admission.      ____________________________________________   FINAL CLINICAL IMPRESSION(S) / ED DIAGNOSES  Final diagnoses:  Cellulitis of left lower extremity without foot  Acute kidney injury (nontraumatic) (HCC)      NEW MEDICATIONS STARTED DURING THIS VISIT:  New Prescriptions   No medications on file     Note:  This document was prepared using Dragon voice recognition software and may include unintentional dictation errors.     Sharyn CreamerQuale, Mark, MD 03/07/17 0000

## 2017-03-06 NOTE — Progress Notes (Signed)
Pharmacy Antibiotic Note  Pat [name] [name] is a 84 y.o. female admitted on 03/06/2017 with chronic cellulitis.  Pharmacy has been consulted for vancomycin and Zosyn dosing.  Plan: Ke= 0.033 h-1 Vd= 55.3 L Vancomycin 1000 q 24 hours with stacked dosing. Will check levels and adjust dosing as clinically indicated.   Zosyn 3.375 g EI q 8 hours.   Height: 5\' 9"  (175.3 cm) Weight: 215 lb (97.5 kg) IBW/kg (Calculated) : 66.2  Temp (24hrs), Avg:98.1 F (36.7 C), Min:97.8 F (36.6 C), Max:98.4 F (36.9 C)  Recent Labs  Lab 03/06/17 1346  WBC 4.5  CREATININE 1.38*    Estimated Creatinine Clearance: 35 mL/min (A) (by C-G formula based on SCr of 1.38 mg/dL (H)).    No Known Allergies  Antimicrobials this admission: vancomycin 2/25 >>  Zosyn 2/25 >>   Dose adjustments this admission:   Microbiology results: 2/25 BCx: sent  Thank you for allowing pharmacy to be a part of this patient's care.  Luisa HartChristy, B[place]on Broadwater D 03/06/2017 7:22 PM

## 2017-03-06 NOTE — ED Notes (Signed)
Admitting MD at bedside.

## 2017-03-06 NOTE — H&P (Signed)
[facility] [place] [facility]   PATIENT NAME: [name] [name]    MR#:  [id number]  DATE OF BIRTH:  07/04/1926  DATE OF ADMISSION:  03/06/2017  PRIMARY CARE PHYSICIAN: Leotis ShamesSingh, Jasmine, MD   REQUESTING/REFERRING PHYSICIAN:   CHIEF COMPLAINT:   Chief Complaint  Patient presents with  . Leg Pain    HISTORY OF PRESENT ILLNESS: [name] PatrickDorothy Hargadon  is a 84 y.o. female with a known history of chronic recurring episodes of lower extremity cellulitis, hypertension, obesity, lymphedema, chronic leg wounds, presenting with one-week history of left leg pain with associated increase swelling, ER workup noted for acute recurrent left lower extremity cellulitis, chronic leg wounds noted, patient evaluated at the bedside in the emergency room, lower extremity Doppler was negative for DVT, patient in no apparent distress, patient is now been admitted for acute on chronic cellulitis complicated by acute on chronic left lower extremity ulcerations.  PAST MEDICAL HISTORY:   Past Medical History:  Diagnosis Date  . Cellulitis 10/28/2016   bilateral lower extremities.     PAST SURGICAL HISTORY:  Past Surgical History:  Procedure Laterality Date  . ABDOMINAL HYSTERECTOMY    . GANGLION CYST EXCISION      SOCIAL HISTORY:  Social History   Tobacco Use  . Smoking status: Never Smoker  . Smokeless tobacco: Never Used  Substance Use Topics  . Alcohol use: No    FAMILY HISTORY:  Family History  Problem Relation Age of Onset  . Diabetes Mellitus II Mother   . Heart disease Father     DRUG ALLERGIES: NKDA  REVIEW OF SYSTEMS:   CONSTITUTIONAL: No fever, fatigue or weakness.  EYES: No blurred or double vision.  EARS, NOSE, AND THROAT: No tinnitus or ear pain.  RESPIRATORY: No cough, shortness of breath, wheezing or hemoptysis.  CARDIOVASCULAR: No chest pain, orthopnea, edema.  GASTROINTESTINAL: No nausea, vomiting, diarrhea or abdominal pain.  GENITOURINARY: No dysuria,  hematuria.  ENDOCRINE: No polyuria, nocturia,  HEMATOLOGY: No anemia, easy bruising or bleeding SKIN: Left leg swelling, pain, leg wounds MUSCULOSKELETAL: No joint pain or arthritis.   NEUROLOGIC: No tingling, numbness, weakness.  PSYCHIATRY: No anxiety or depression.   MEDICATIONS AT HOME:  Prior to Admission medications   Medication Sig Start Date End Date Taking? Authorizing Provider  acetaminophen (TYLENOL) 325 MG tablet Take 2 tablets (650 mg total) every 6 (six) hours as needed by mouth for mild pain (or Fever >/= 101). 11/28/16  Yes Gouru, Deanna ArtisAruna, MD  acidophilus (RISAQUAD) CAPS capsule Take 2 capsules 3 (three) times daily by mouth. 11/28/16  Yes Gouru, Aruna, MD  ascorbic acid (VITAMIN C) 100 MG tablet Take 1 tablet (100 mg total) by mouth daily. 11/07/16  Yes Enedina FinnerPatel, Sona, MD  ferrous sulfate 325 (65 FE) MG tablet Take 1 tablet (325 mg total) by mouth daily. 11/07/16  Yes Enedina FinnerPatel, Sona, MD  furosemide (LASIX) 20 MG tablet Take 1 tablet (20 mg total) by mouth daily as needed for edema. Patient taking differently: Take 20 mg by mouth 2 (two) times daily.  11/07/16 11/07/17 Yes Enedina FinnerPatel, Sona, MD  Multiple Vitamins-Minerals (MULTIVITAL) tablet Take 1 tablet by mouth daily. 11/07/16  Yes Enedina FinnerPatel, Sona, MD  potassium chloride (K-DUR,KLOR-CON) 10 MEQ tablet Take 10 mEq by mouth daily.   Yes [provider]      PHYSICAL EXAMINATION:   VITAL SIGNS: Blood pressure (!) 102/44, pulse 79, temperature 97.8 F (36.6 C), temperature source Oral, resp. rate 20, height 5\' 9"  (1.753 m),  weight 97.5 kg (215 lb), SpO2 99 %.  GENERAL:  84 y.o.-year-old patient lying in the bed with no acute distress.  Frail-appearing EYES: Pupils equal, round, reactive to light and accommodation. No scleral icterus. Extraocular muscles intact.  HEENT: Head atraumatic, normocephalic. Oropharynx and nasopharynx clear.  NECK:  Supple, no jugular venous distention. No thyroid enlargement, no tenderness.  LUNGS:  Normal breath sounds bilaterally, no wheezing, rales,rhonchi or crepitation. No use of accessory muscles of respiration.  CARDIOVASCULAR: S1, S2 normal. No murmurs, rubs, or gallops.  ABDOMEN: Soft, nontender, nondistended. Bowel sounds present. No organomegaly or mass.  EXTREMITIES: Chronic lymphedema, left extremity much greater in size than the right, cyanosis, or clubbing.  NEUROLOGIC: Cranial nerves II through XII are intact. Muscle strength 5/5 in all extremities. Sensation intact. Gait not checked.  PSYCHIATRIC: The patient is alert and oriented x 3.  SKIN: Left lower extremity cellulitis, acute on chronic left leg ulcerations    LABORATORY PANEL:   CBC Recent Labs  Lab 03/06/17 1346  WBC 4.5  HGB 9.9*  HCT 30.0*  PLT 239  MCV 88.6  MCH 29.4  MCHC 33.1  RDW 17.1*  LYMPHSABS 1.3  MONOABS 0.3  EOSABS 0.5  BASOSABS 0.1   ------------------------------------------------------------------------------------------------------------------  Chemistries  Recent Labs  Lab 03/06/17 1346  NA 137  K 5.3*  CL 104  CO2 25  GLUCOSE 115*  BUN 47*  CREATININE 1.38*  CALCIUM 9.5  AST 31  ALT 8*  ALKPHOS 64  BILITOT 0.9   ------------------------------------------------------------------------------------------------------------------ estimated creatinine clearance is 35 mL/min (A) (by C-G formula based on SCr of 1.38 mg/dL (H)). ------------------------------------------------------------------------------------------------------------------ No results for input(s): TSH, T4TOTAL, T3FREE, THYROIDAB in the last 72 hours.  Invalid input(s): FREET3   Coagulation profile No results for input(s): INR, PROTIME in the last 168 hours. ------------------------------------------------------------------------------------------------------------------- No results for input(s): DDIMER in the last 72  hours. -------------------------------------------------------------------------------------------------------------------  Cardiac Enzymes No results for input(s): CKMB, TROPONINI, MYOGLOBIN in the last 168 hours.  Invalid input(s): CK ------------------------------------------------------------------------------------------------------------------ Invalid input(s): POCBNP  ---------------------------------------------------------------------------------------------------------------  Urinalysis    Component Value Date/Time   COLORURINE YELLOW (A) 11/24/2016 0415   APPEARANCEUR CLEAR (A) 11/24/2016 0415   LABSPEC 1.015 11/24/2016 0415   PHURINE 5.0 11/24/2016 0415   GLUCOSEU NEGATIVE 11/24/2016 0415   HGBUR NEGATIVE 11/24/2016 0415   BILIRUBINUR NEGATIVE 11/24/2016 0415   KETONESUR NEGATIVE 11/24/2016 0415   PROTEINUR NEGATIVE 11/24/2016 0415   NITRITE POSITIVE (A) 11/24/2016 0415   LEUKOCYTESUR TRACE (A) 11/24/2016 0415     RADIOLOGY: US Venous Img Lower Unilateral Left  Result Date: 03/06/2017 CLINICAL DATA:  Left lower extremity pain and edema. Concern for cellulitis. Evaluate for DVT. EXAM: LEFT LOWER EXTREMITY VENOUS DOPPLER ULTRASOUND TECHNIQUE: Gray-scale sonography with graded compression, as well as color Doppler and duplex ultrasound were performed to evaluate the lower extremity deep venous systems from the level of the common femoral vein and including the common femoral, femoral, profunda femoral, popliteal and calf veins including the posterior tibial, peroneal and gastrocnemius veins when visible. The superficial great saphenous vein was also interrogated. Spectral Doppler was utilized to evaluate flow at rest and with distal augmentation maneuvers in the common femoral, femoral and popliteal veins. COMPARISON:  Left lower extremity venous Doppler ultrasound - 11/25/2016 FINDINGS: Contralateral Common Femoral Vein: Respiratory phasicity is normal and symmetric with the  symptomatic side. No evidence of thrombus. Normal compressibility. Common Femoral Vein: No evidence of thrombus. Normal compressibility, respiratory phasicity and response to augmentation. Saphenofemoral Junction: No evidence of  thrombus. Normal compressibility and flow on color Doppler imaging. Profunda Femoral Vein: No evidence of thrombus. Normal compressibility and flow on color Doppler imaging. Femoral Vein: No evidence of thrombus. Normal compressibility, respiratory phasicity and response to augmentation. Popliteal Vein: No evidence of thrombus. Normal compressibility, respiratory phasicity and response to augmentation. Calf Veins: Not visualized. Superficial Great Saphenous Vein: No evidence of thrombus. Normal compressibility. Venous Reflux:  None. Other Findings:  None. IMPRESSION: No evidence of DVT within the left lower extremity. Electronically Signed   By: Simonne Come M.D.   On: 03/06/2017 16:55    EKG: Orders placed or performed during the hospital encounter of 11/24/16  . EKG 12-Lead  . EKG 12-Lead    IMPRESSION AND PLAN: 1 acute recurrent left lower extremity cellulitis Most likely secondary to acute on chronic left lower extremity leg ulcerations Admit to regular nursing floor bed, empiric vancomycin/Zosyn, consult infectious disease given frequent recurrent events of this malady-?  May need preventive antibiotics after treatment of this recurrent event   2 acute on chronic left lower extremity leg ulcerations  Exacerbated by chronic lymphedema  Wound care nurse consulted   3 chronic lymphedema  Appears noncompliant with leg elevation in compressive therapy  Encourage leg elevation while in house, Ace wraps to bilateral lower extremities, continue p.o. Lasix, give Zaroxolyn p.o. x1  4 chronic benign essential hypertension  Stable  Continue p.o. Lasix   All the records are reviewed and case discussed with ED provider. Management plans discussed with the patient, family  and they are in agreement.  CODE STATUS:full Code Status History    Date Active Date Inactive Code Status Order ID Comments User Context   11/25/2016 02:38 11/28/2016 22:29 Full Code [id number]  Oralia Manis, MD Inpatient   11/05/2016 01:04 11/07/2016 22:37 Full Code [id number]  Arnaldo Natal, MD ED       TOTAL TIME TAKING CARE OF THIS PATIENT: 45 minutes.    Evelena Asa Salary M.D on 03/06/2017   Between 7am to 6pm - Pager - [phone]  After 6pm go to [email or web address] - password [email or web address]  [facility]  Office  [phone]  CC: Primary care physician; Leotis Shames, MD   Note: This dictation was prepared with Dragon dictation along with smaller phrase technology. Any transcriptional errors that result from this process are unintentional.

## 2017-03-06 NOTE — ED Notes (Signed)
Report to Vanessa, RN.

## 2017-03-07 LAB — BASIC METABOLIC PANEL
Anion gap: 8 (ref 5–15)
BUN: 35 mg/dL — AB (ref 6–20)
CALCIUM: 8.7 mg/dL — AB (ref 8.9–10.3)
CHLORIDE: 107 mmol/L (ref 101–111)
CO2: 23 mmol/L (ref 22–32)
CREATININE: 1.13 mg/dL — AB (ref 0.44–1.00)
GFR calc non Af Amer: 42 mL/min — ABNORMAL LOW (ref >60)
GFR, EST AFRICAN AMERICAN: 49 mL/min — AB (ref >60)
Glucose, Bld: 101 mg/dL — ABNORMAL HIGH (ref 65–99)
Potassium: 4.3 mmol/L (ref 3.5–5.1)
SODIUM: 138 mmol/L (ref 135–145)

## 2017-03-07 NOTE — Clinical Social Work Note (Addendum)
Clinical Social Work Assessment  Patient Details  Name: [name] [name] MRN: [id number] Date of Birth: 1926/09/28  Date of referral:  03/07/17               Reason for consult:  Facility Placement, Discharge Planning                Permission sought to share information with:  Chartered certified accountant granted to share information::  Yes, Verbal Permission Granted  Name::      [place]::   [place]  Relationship::     Contact Information:     Housing/Transportation Living arrangements for the past 2 months:  [place] of Information:  Patient, Adult Children Patient Interpreter Needed:  None Criminal Activity/Legal Involvement Pertinent to Current Situation/Hospitalization:  No - Comment as needed Significant Relationships:  Adult Children Lives with:  Self Do you feel safe going back to the place where you live?  Yes Need for family participation in patient care:  Yes (Comment)  Care giving concerns: Patient lives alone in [place].   Social Worker assessment / plan: Holiday representative ([place]) received verbal consult from nurse during progression rounds regarding concerns about patient living alone. Social work Theatre manager met with patient to assess care at home. Social work Theatre manager met with patient alone at bedside. Patient was sitting up in bed alert and oriented only to self. Social work Theatre manager introduced self and explained the role of the [place]. Patient stated she lives alone in [place] and [name] [phone]) is her primary contact. Patient explained that she has a wheelchair at home to assist with mobility and has been to a few of the nursing homes in [facility] for rehab. Patient could not recall the names of the facilities. Patient stated she does not need anything at home. Social work Theatre manager contacted patient's [name], who confirmed above. Per [name] [name] patient has a caregiver that goes to her  house 2-3 times a week. Per [name] [name] patient was recently discharged from [facility] in January 2019. CSW and Social work Theatre manager will continue to follow up and assist.  Employment status:  Retired Nurse, adult PT Recommendations:  Not assessed at this time Information / Referral to community resources:     Patient/Family's Response to care: Patient and patient's son explained that nothing is needed at home.  Patient/Family's Understanding of and Emotional Response to Diagnosis, Current Treatment, and Prognosis: Patient and patient's son were both pleasant and thanked social work Theatre manager for her assistance.  Emotional Assessment Appearance:  Appears stated age Attitude/Demeanor/Rapport:    Affect (typically observed):  Calm, Pleasant, Adaptable Orientation:  Oriented to Self Alcohol / Substance use:  Not Applicable Psych involvement (Current and /or in the community):  No (Comment)  Discharge Needs  Concerns to be addressed:    Readmission within the last 30 days:  No Current discharge risk:  Dependent with Mobility, Cognitively Impaired Barriers to Discharge:  Continued Medical Work up   Smith Mince, Student-Social Work 03/07/2017, 2:57 PM

## 2017-03-07 NOTE — Progress Notes (Signed)
[facility] - [place] at [facility]                                                                                                                                                                                  Patient Demographics   [name] [name], is a 84 y.o. female, DOB - [birth year], WUJ:[id number]  Admit date - 03/06/2017   Admitting Physician Bertrum Sol, MD  Outpatient Primary MD for the patient is Leotis Shames, MD   LOS - 1  Subjective: Patient currently denies any symptoms, states that she was felt was supposed to follow-up at the wound clinic but did not make that appointment. No significant pain in the leg   Review of Systems:   CONSTITUTIONAL: No documented fever. No fatigue, weakness. No weight gain, no weight loss.  EYES: No blurry or double vision.  ENT: No tinnitus. No postnasal drip. No redness of the oropharynx.  RESPIRATORY: No cough, no wheeze, no hemoptysis. No dyspnea.  CARDIOVASCULAR: No chest pain. No orthopnea. No palpitations. No syncope.  GASTROINTESTINAL: No nausea, no vomiting or diarrhea. No abdominal pain. No melena or hematochezia.  GENITOURINARY: No dysuria or hematuria.  ENDOCRINE: No polyuria or nocturia. No heat or cold intolerance.  HEMATOLOGY: No anemia. No bruising. No bleeding.  INTEGUMENTARY: No rashes. No lesions.  Left leg swelling and redness MUSCULOSKELETAL: No arthritis. No swelling. No gout.  NEUROLOGIC: No numbness, tingling, or ataxia. No seizure-type activity.  PSYCHIATRIC: No anxiety. No insomnia. No ADD.    Vitals:   Vitals:   03/06/17 2002 03/07/17 0420 03/07/17 0421 03/07/17 0700  BP:  (!) 112/43  (!) 102/40  Pulse:  67  85  Resp:  19  20  Temp:  98.7 F (37.1 C)  98.6 F (37 C)  TempSrc:  Oral  Oral  SpO2:  95%  98%  Weight: 207 lb 14.4 oz (94.3 kg)  207 lb 14.4 oz (94.3 kg)   Height:        Wt Readings from Last 3 Encounters:  03/07/17 207 lb 14.4 oz (94.3 kg)  11/25/16 217 lb 4.8 oz  (98.6 kg)  11/07/16 231 lb 6.4 oz (105 kg)     Intake/Output Summary (Last 24 hours) at 03/07/2017 1503 Last data filed at 03/07/2017 0900 Gross per 24 hour  Intake 870 ml  Output 0 ml  Net 870 ml    Physical Exam:   GENERAL: Pleasant-appearing in no apparent distress.  HEAD, EYES, EARS, NOSE AND THROAT: Atraumatic, normocephalic. Extraocular muscles are intact. Pupils equal and reactive to light. Sclerae anicteric. No conjunctival injection. No oro-pharyngeal erythema.  NECK: Supple. There is no jugular venous distention. No bruits,  no lymphadenopathy, no thyromegaly.  HEART: Regular rate and rhythm,. No murmurs, no rubs, no clicks.  LUNGS: Clear to auscultation bilaterally. No rales or rhonchi. No wheezes.  ABDOMEN: Soft, flat, nontender, nondistended. Has good bowel sounds. No hepatosplenomegaly appreciated.  EXTREMITIES: Left lower extremity is currently wrapped it was done earlier by wound care nurse NEUROLOGIC: The patient is alert, awake, and oriented x3 with no focal motor or sensory deficits appreciated bilaterally.  SKIN: Moist and warm with no rashes appreciated.  Psych: Not anxious, depressed LN: No inguinal LN enlargement    Antibiotics   Anti-infectives (From admission, onward)   Start     Dose/Rate Route Frequency Ordered Stop   03/06/17 2300  vancomycin (VANCOCIN) IVPB 1000 mg/200 mL premix     1,000 mg 200 mL/hr over 60 Minutes Intravenous Every 24 hours 03/06/17 1922     03/06/17 2200  piperacillin-tazobactam (ZOSYN) IVPB 3.375 g     3.375 g 12.5 mL/hr over 240 Minutes Intravenous Every 8 hours 03/06/17 1922     03/06/17 2200  piperacillin-tazobactam (ZOSYN) IVPB 3.375 g  Status:  Discontinued     3.375 g 12.5 mL/hr over 240 Minutes Intravenous Every 8 hours 03/06/17 2005 03/06/17 2006   03/06/17 2000  piperacillin-tazobactam (ZOSYN) IVPB 3.375 g  Status:  Discontinued    Comments:  Pharmacy to dose for left lower extremity cellulitis, acute on chronic leg  ulcerations   3.375 g 100 mL/hr over 30 Minutes Intravenous Every 6 hours 03/06/17 1958 03/06/17 2004   03/06/17 1645  vancomycin (VANCOCIN) IVPB 1000 mg/200 mL premix     1,000 mg 200 mL/hr over 60 Minutes Intravenous STAT 03/06/17 1633 03/06/17 1817      Medications   Scheduled Meds: . acidophilus  2 capsule Oral TID  . ascorbic acid  100 mg Oral Daily  . docusate sodium  100 mg Oral BID  . ferrous sulfate  325 mg Oral Daily  . furosemide  20 mg Oral BID  . heparin  5,000 Units Subcutaneous Q8H  . multivitamin with minerals  1 tablet Oral Daily  . sodium chloride flush  3 mL Intravenous Q12H   Continuous Infusions: . sodium chloride    . piperacillin-tazobactam (ZOSYN)  IV 3.375 g (03/07/17 1321)  . vancomycin Stopped (03/06/17 2357)   PRN Meds:.sodium chloride, acetaminophen **OR** acetaminophen, bisacodyl, HYDROcodone-acetaminophen, ondansetron **OR** ondansetron (ZOFRAN) IV, polyethylene glycol, sodium chloride flush, sodium phosphate   Data Review:   Micro Results Recent Results (from the past 240 hour(s))  Blood Culture (routine x 2)     Status: None (Preliminary result)   Collection Time: 03/06/17  4:05 PM  Result Value Ref Range Status   Specimen Description BLOOD RIGHT ANTECUBITAL  Final   Special Requests   Final    BOTTLES DRAWN AEROBIC AND ANAEROBIC Blood Culture results may not be optimal due to an inadequate volume of blood received in culture bottles   Culture   Final    NO GROWTH < 24 HOURS Performed at [facility], [address] [place], [place] [address]    Report Status PENDING  Incomplete  Blood Culture (routine x 2)     Status: None (Preliminary result)   Collection Time: 03/06/17  4:05 PM  Result Value Ref Range Status   Specimen Description BLOOD BLOOD RIGHT ARM  Final   Special Requests   Final    BOTTLES DRAWN AEROBIC AND ANAEROBIC Blood Culture adequate volume   Culture   Final  NO GROWTH < 24 HOURS Performed at [facility], [address] [place], [place] [address]    Report Status PENDING  Incomplete    Radiology Reports US Venous Img Lower Unilateral Left  Result Date: 03/06/2017 CLINICAL DATA:  Left lower extremity pain and edema. Concern for cellulitis. Evaluate for DVT. EXAM: LEFT LOWER EXTREMITY VENOUS DOPPLER ULTRASOUND TECHNIQUE: Gray-scale sonography with graded compression, as well as color Doppler and duplex ultrasound were performed to evaluate the lower extremity deep venous systems from the level of the common femoral vein and including the common femoral, femoral, profunda femoral, popliteal and calf veins including the posterior tibial, peroneal and gastrocnemius veins when visible. The superficial great saphenous vein was also interrogated. Spectral Doppler was utilized to evaluate flow at rest and with distal augmentation maneuvers in the common femoral, femoral and popliteal veins. COMPARISON:  Left lower extremity venous Doppler ultrasound - 11/25/2016 FINDINGS: Contralateral Common Femoral Vein: Respiratory phasicity is normal and symmetric with the symptomatic side. No evidence of thrombus. Normal compressibility. Common Femoral Vein: No evidence of thrombus. Normal compressibility, respiratory phasicity and response to augmentation. Saphenofemoral Junction: No evidence of thrombus. Normal compressibility and flow on color Doppler imaging. Profunda Femoral Vein: No evidence of thrombus. Normal compressibility and flow on color Doppler imaging. Femoral Vein: No evidence of thrombus. Normal compressibility, respiratory phasicity and response to augmentation. Popliteal Vein: No evidence of thrombus. Normal compressibility, respiratory phasicity and response to augmentation. Calf Veins: Not visualized. Superficial Great Saphenous Vein: No evidence of thrombus. Normal compressibility. Venous Reflux:  None. Other Findings:  None. IMPRESSION: No evidence of DVT within the left lower extremity.  Electronically Signed   By: Simonne Come M.D.   On: 03/06/2017 16:55     CBC Recent Labs  Lab 03/06/17 1346  WBC 4.5  HGB 9.9*  HCT 30.0*  PLT 239  MCV 88.6  MCH 29.4  MCHC 33.1  RDW 17.1*  LYMPHSABS 1.3  MONOABS 0.3  EOSABS 0.5  BASOSABS 0.1    Chemistries  Recent Labs  Lab 03/06/17 1346 03/07/17 0310  NA 137 138  K 5.3* 4.3  CL 104 107  CO2 25 23  GLUCOSE 115* 101*  BUN 47* 35*  CREATININE 1.38* 1.13*  CALCIUM 9.5 8.7*  AST 31  --   ALT 8*  --   ALKPHOS 64  --   BILITOT 0.9  --    ------------------------------------------------------------------------------------------------------------------ estimated creatinine clearance is 42 mL/min (A) (by C-G formula based on SCr of 1.13 mg/dL (H)). ------------------------------------------------------------------------------------------------------------------ No results for input(s): HGBA1C in the last 72 hours. ------------------------------------------------------------------------------------------------------------------ No results for input(s): CHOL, HDL, LDLCALC, TRIG, CHOLHDL, LDLDIRECT in the last 72 hours. ------------------------------------------------------------------------------------------------------------------ No results for input(s): TSH, T4TOTAL, T3FREE, THYROIDAB in the last 72 hours.  Invalid input(s): FREET3 ------------------------------------------------------------------------------------------------------------------ No results for input(s): VITAMINB12, FOLATE, FERRITIN, TIBC, IRON, RETICCTPCT in the last 72 hours.  Coagulation profile No results for input(s): INR, PROTIME in the last 168 hours.  No results for input(s): DDIMER in the last 72 hours.  Cardiac Enzymes No results for input(s): CKMB, TROPONINI, MYOGLOBIN in the last 168 hours.  Invalid input(s): CK ------------------------------------------------------------------------------------------------------------------ Invalid  input(s): POCBNP    Assessment & Plan  Patient is 84 year old with recent history of cellulitis of the leg in November was in rehab until about a month ago who is presenting with left lower extremity cellulitis   1 acute recurrent left lower extremity cellulitis Most likely secondary to acute on chronic left lower extremity leg ulcerations  Cultures were taken by infectious disease physician Continue vancomycin and Zosyn Appreciate wound care nurse input   2 acute on chronic left lower extremity leg ulcerations  Exacerbated by chronic lymphedema  Continue on a bit with wrapping  3 chronic lymphedema  Appears noncompliant with leg elevation in compressive therapy  Encourage leg elevation  Continue Lasix   4 chronic benign essential hypertension  Stable  Continue p.o. Lasix   5.  Miscellaneous heparin for DVT prophylaxis      Code Status Orders  (From admission, onward)        Start     Ordered   03/06/17 1959  Full code  Continuous     03/06/17 1958    Code Status History    Date Active Date Inactive Code Status Order ID Comments User Context   11/25/2016 02:38 11/28/2016 22:29 Full Code [id number]  Oralia ManisWillis, David, MD Inpatient   11/05/2016 01:04 11/07/2016 22:37 Full Code [id number]  Arnaldo Nataliamond, Michael S, MD ED           Consults infectious disease  DVT Prophylaxis heparin Lab Results  Component Value Date   PLT 239 03/06/2017     Time Spent in minutes   35nmin Greater than 50% of time spent in care coordination and counseling patient regarding the condition and plan of care.   Auburn BilberryShreyang Nashla Althoff M.D on 03/07/2017 at 3:03 PM  Between 7am to 6pm - Pager - [phone]  After 6pm go to [email or web address] - password [email or web address] [facility]  [facility]   Office  [phone]

## 2017-03-07 NOTE — [place] FL2 (Signed)
[place] MEDICAID FL2 LEVEL OF CARE SCREENING TOOL     IDENTIFICATION  Patient Name: [name] [name] Birthdate: 07-21-1926 Sex: female Admission Date (Current Location): 03/06/2017  [place] Number:  Chiropodist and Address:  [facility], [address] [place], [place] [address]      Provider Number: [id number]  Attending Physician Name and Address:  Auburn Bilberry, MD  Relative Name and Phone Number:       Current Level of Care: Hospital Recommended Level of Care: Skilled Nursing Facility Prior Approval Number:    Date Approved/Denied:   PASRR Number: [id number])  Discharge Plan: SNF    Current Diagnoses: Patient Active Problem List   Diagnosis Date Noted  . Cellulitis 03/06/2017  . Left leg cellulitis 11/25/2016  . AKI (acute kidney injury) (HCC) 11/05/2016  . Pressure injury of skin 11/05/2016  . Elevated troponin 11/04/2016    Orientation RESPIRATION BLADDER Height & Weight     Self  Normal Incontinent Weight: 207 lb 14.4 oz (94.3 kg) Height:  5\' 9"  (175.3 cm)  BEHAVIORAL SYMPTOMS/MOOD NEUROLOGICAL BOWEL NUTRITION STATUS      Continent Diet(Heart Healthy)  AMBULATORY STATUS COMMUNICATION OF NEEDS Skin   Extensive Assist Verbally Bruising(Left Leg Swelling)                       Personal Care Assistance Level of Assistance  Bathing, Feeding, Dressing Bathing Assistance: Limited assistance Feeding assistance: Independent Dressing Assistance: Limited assistance     Functional Limitations Info  Sight, Hearing, Speech Sight Info: Adequate Hearing Info: Adequate Speech Info: Adequate    SPECIAL CARE FACTORS FREQUENCY  PT (By licensed PT), OT (By licensed OT)     PT Frequency: (5) OT Frequency: (5)            Contractures      Additional Factors Info  Code Status, Allergies Code Status Info: (Full Code) Allergies Info: (No Known Allergies)           Current Medications  (03/07/2017):  This is the current hospital active medication list Current Facility-Administered Medications  Medication Dose Route Frequency Provider Last Rate Last Dose  . 0.9 %  sodium chloride infusion  250 mL Intravenous PRN Salary, Montell D, MD      . acetaminophen (TYLENOL) tablet 650 mg  650 mg Oral Q6H PRN Salary, Montell D, MD       Or  . acetaminophen (TYLENOL) suppository 650 mg  650 mg Rectal Q6H PRN Salary, Montell D, MD      . acidophilus (RISAQUAD) capsule 2 capsule  2 capsule Oral TID Angelina Ok D, MD   2 capsule at 03/07/17 0933  . ascorbic acid (VITAMIN C) tablet 100 mg  100 mg Oral Daily Salary, Montell D, MD   100 mg at 03/07/17 1322  . bisacodyl (DULCOLAX) suppository 10 mg  10 mg Rectal Daily PRN Salary, Montell D, MD      . docusate sodium (COLACE) capsule 100 mg  100 mg Oral BID Angelina Ok D, MD   100 mg at 03/07/17 0839  . ferrous sulfate tablet 325 mg  325 mg Oral Daily Salary, Montell D, MD   325 mg at 03/07/17 5621  . furosemide (LASIX) tablet 20 mg  20 mg Oral BID Salary, Montell D, MD      . heparin injection 5,000 Units  5,000 Units Subcutaneous Q8H Salary, Montell D, MD   5,000 Units at 03/07/17 1321  .  HYDROcodone-acetaminophen (NORCO/VICODIN) 5-325 MG per tablet 1-2 tablet  1-2 tablet Oral Q4H PRN Salary, Montell D, MD      . multivitamin with minerals tablet 1 tablet  1 tablet Oral Daily Salary, Montell D, MD   1 tablet at 03/07/17 0839  . ondansetron (ZOFRAN) tablet 4 mg  4 mg Oral Q6H PRN Salary, Montell D, MD       Or  . ondansetron (ZOFRAN) injection 4 mg  4 mg Intravenous Q6H PRN Salary, Montell D, MD      . piperacillin-tazobactam (ZOSYN) IVPB 3.375 g  3.375 g Intravenous Q8H Salary, Montell D, MD 12.5 mL/hr at 03/07/17 1321 3.375 g at 03/07/17 1321  . polyethylene glycol (MIRALAX / GLYCOLAX) packet 17 g  17 g Oral Daily PRN Salary, Montell D, MD      . sodium chloride flush (NS) 0.9 % injection 3 mL  3 mL Intravenous Q12H Salary, Montell D, MD    3 mL at 03/07/17 0840  . sodium chloride flush (NS) 0.9 % injection 3 mL  3 mL Intravenous PRN Salary, Montell D, MD      . sodium phosphate (FLEET) 7-19 GM/118ML enema 1 enema  1 enema Rectal Once PRN Salary, Montell D, MD      . vancomycin (VANCOCIN) IVPB 1000 mg/200 mL premix  1,000 mg Intravenous Q24H Salary, Evelena AsaMontell D, MD   Stopped at 03/06/17 2357     Discharge Medications: Please see discharge summary for a list of discharge medications.  Relevant Imaging Results:  Relevant Lab Results:   Additional Information (SSN: [id number])  Payton SparkAnanda A Mccormick [place], Student-Social Work

## 2017-03-07 NOTE — Consult Note (Signed)
WOC Nurse wound consult note Reason for Consult: Lymphedema with nonintact lesion to left lateral lower leg.  Present on admission.  Wears UNNA boot to left leg only.  Edema present in right lower extremity, patient refuses compression to right leg.  Will wrap left leg andprovide topical therapy for lesions to left leg.  Wound type:venous insufficiency/lymphedema Pressure Injury POA: NA Measurement: LEft lateral leg:  4 cm x 3 cm x 0.2 cm  Wound bed: ruddy red.  Chronic skin changes to lower leg Drainage (amount, consistency, odor) minimal serosanguinous weeping.  Musty odor Periwound:dry skin, chronic skin changes to bilateral lower legs Dressing procedure/placement/frequency:Cleanse left leg with soap and water and pat dry.  Apply calcium alginate to wound bed.  Wrap leg with zinc layer from below toes to below knee.  Secure with self adherent coban.  Change twice weekly.  WOC team will follow.  Maple HudsonKaren Mikaella Escalona RN BSN CWON Pager [phone]

## 2017-03-07 NOTE — Consult Note (Signed)
[facility] Infectious Disease     Reason for Consult LLE wounds and infection    Referring Physician: Loney Hering Date of Admission:  03/06/2017   Active Problems:   Cellulitis   HPI: [name] [name] is a 84 y.o. female with hx of obesity, lymphedema, htn and chronic LE wounds admitted with one week hx of L leg pain and infection. On admit wbc 4.5 no fevers. Started vanco and zosyn. Neg DVT. No prior wound cxs available. Had has multiple admission - 3 in last 6 months. She states she was just dced from rehab when she started eating more salt and legs swelled up again. She then developed wound.  Past Medical History:  Diagnosis Date  . Cellulitis 10/28/2016   bilateral lower extremities.    Past Surgical History:  Procedure Laterality Date  . ABDOMINAL HYSTERECTOMY    . GANGLION CYST EXCISION     Social History   Tobacco Use  . Smoking status: Never Smoker  . Smokeless tobacco: Never Used  Substance Use Topics  . Alcohol use: No  . Drug use: No   Family History  Problem Relation Age of Onset  . Diabetes Mellitus II Mother   . Heart disease Father     Allergies: No Known Allergies  Current antibiotics: Antibiotics Given (last 72 hours)    Date/Time Action Medication Dose Rate   03/06/17 1715 New Bag/Given   vancomycin (VANCOCIN) IVPB 1000 mg/200 mL premix 1,000 mg 200 mL/hr   03/06/17 2042 New Bag/Given   piperacillin-tazobactam (ZOSYN) IVPB 3.375 g 3.375 g 12.5 mL/hr   03/06/17 2202 New Bag/Given   vancomycin (VANCOCIN) IVPB 1000 mg/200 mL premix 1,000 mg 200 mL/hr   03/07/17 0424 New Bag/Given   piperacillin-tazobactam (ZOSYN) IVPB 3.375 g 3.375 g 12.5 mL/hr   03/07/17 1321 New Bag/Given   piperacillin-tazobactam (ZOSYN) IVPB 3.375 g 3.375 g 12.5 mL/hr      MEDICATIONS: . acidophilus  2 capsule Oral TID  . ascorbic acid  100 mg Oral Daily  . docusate sodium  100 mg Oral BID  . ferrous sulfate  325 mg Oral Daily  . furosemide  20 mg Oral BID  .  heparin  5,000 Units Subcutaneous Q8H  . multivitamin with minerals  1 tablet Oral Daily  . sodium chloride flush  3 mL Intravenous Q12H    Review of Systems - 11 systems reviewed and negative per HPI   OBJECTIVE: Temp:  [97.9 F (36.6 C)-98.7 F (37.1 C)] 98.6 F (37 C) (02/26 0700) Pulse Rate:  [67-87] 85 (02/26 0700) Resp:  [17-20] 20 (02/26 0700) BP: (102-144)/(40-71) 102/40 (02/26 0700) SpO2:  [93 %-99 %] 98 % (02/26 0700) Weight:  [94.3 kg (207 lb 14.4 oz)] 94.3 kg (207 lb 14.4 oz) (02/26 0421) Physical Exam  Constitutional:  oriented to person, place, and time. Obese HENT: /AT, PERRLA, no scleral icterus Mouth/Throat: Oropharynx is clear and moist. No oropharyngeal exudate.  Cardiovascular: Normal rate, regular rhythm and normal heart sounds. Pulmonary/Chest: Effort normal and breath sounds normal. No respiratory distress.  has no wheezes.  Neck = supple, no nuchal rigidity Abdominal: Soft. Bowel sounds are normal.  exhibits no distension. There is no tenderness.  Ext 2+ edema bil LE Lymphadenopathy: no cervical adenopathy. No axillary adenopathy Neurological: alert and oriented to person, place, and time.  Skin: l leg with erythema and several areas of shallow ulceration, very tender and warm Psychiatric: a normal mood and affect.  behavior is normal.    LABS: Results  for orders placed or performed during the hospital encounter of 03/06/17 (from the past 48 hour(s))  CBC with Differential     Status: Abnormal   Collection Time: 03/06/17  1:46 PM  Result Value Ref Range   WBC 4.5 3.6 - 11.0 K/uL   RBC 3.38 (L) 3.80 - 5.20 MIL/uL   Hemoglobin 9.9 (L) 12.0 - 16.0 g/dL   HCT 30.0 (L) 35.0 - 47.0 %   MCV 88.6 80.0 - 100.0 fL   MCH 29.4 26.0 - 34.0 pg   MCHC 33.1 32.0 - 36.0 g/dL   RDW 17.1 (H) 11.5 - 14.5 %   Platelets 239 150 - 440 K/uL   Neutrophils Relative % 49 %   Neutro Abs 2.2 1.4 - 6.5 K/uL   Lymphocytes Relative 30 %   Lymphs Abs 1.3 1.0 - 3.6 K/uL    Monocytes Relative 8 %   Monocytes Absolute 0.3 0.2 - 0.9 K/uL   Eosinophils Relative 12 %   Eosinophils Absolute 0.5 0 - 0.7 K/uL   Basophils Relative 1 %   Basophils Absolute 0.1 0 - 0.1 K/uL    Comment: Performed at [facility], [address] [place], [place] [address]  Comprehensive metabolic panel     Status: Abnormal   Collection Time: 03/06/17  1:46 PM  Result Value Ref Range   Sodium 137 135 - 145 mmol/L   Potassium 5.3 (H) 3.5 - 5.1 mmol/L    Comment: HEMOLYSIS AT THIS LEVEL MAY AFFECT RESULT   Chloride 104 101 - 111 mmol/L   CO2 25 22 - 32 mmol/L   Glucose, Bld 115 (H) 65 - 99 mg/dL   BUN 47 (H) 6 - 20 mg/dL   Creatinine, Ser 1.38 (H) 0.44 - 1.00 mg/dL   Calcium 9.5 8.9 - 10.3 mg/dL   Total Protein 8.4 (H) 6.5 - 8.1 g/dL   Albumin 4.4 3.5 - 5.0 g/dL   AST 31 15 - 41 U/L    Comment: HEMOLYSIS AT THIS LEVEL MAY AFFECT RESULT   ALT 8 (L) 14 - 54 U/L   Alkaline Phosphatase 64 38 - 126 U/L   Total Bilirubin 0.9 0.3 - 1.2 mg/dL    Comment: HEMOLYSIS AT THIS LEVEL MAY AFFECT RESULT   GFR calc non Af Amer 33 (L) >60 mL/min   GFR calc Af Amer 38 (L) >60 mL/min    Comment: (NOTE) The eGFR has been calculated using the CKD EPI equation. This calculation has not been validated in all clinical situations. eGFR's persistently <60 mL/min signify possible Chronic Kidney Disease.    Anion gap 8 5 - 15    Comment: Performed at [facility], [address] [place], [place] [address]  Blood Culture (routine x 2)     Status: None (Preliminary result)   Collection Time: 03/06/17  4:05 PM  Result Value Ref Range   Specimen Description BLOOD RIGHT ANTECUBITAL    Special Requests      BOTTLES DRAWN AEROBIC AND ANAEROBIC Blood Culture results may not be optimal due to an inadequate volume of blood received in culture bottles   Culture      NO GROWTH < 24 HOURS Performed at [facility], [address], [place], [place] [address]    Report Status  PENDING   Blood Culture (routine x 2)     Status: None (Preliminary result)   Collection Time: 03/06/17  4:05 PM  Result Value Ref Range   Specimen Description BLOOD BLOOD  RIGHT ARM    Special Requests      BOTTLES DRAWN AEROBIC AND ANAEROBIC Blood Culture adequate volume   Culture      NO GROWTH < 24 HOURS Performed at [facility], [address] [place], [place] [address]    Report Status PENDING   Basic metabolic panel     Status: Abnormal   Collection Time: 03/07/17  3:10 AM  Result Value Ref Range   Sodium 138 135 - 145 mmol/L   Potassium 4.3 3.5 - 5.1 mmol/L   Chloride 107 101 - 111 mmol/L   CO2 23 22 - 32 mmol/L   Glucose, Bld 101 (H) 65 - 99 mg/dL   BUN 35 (H) 6 - 20 mg/dL   Creatinine, Ser 1.13 (H) 0.44 - 1.00 mg/dL   Calcium 8.7 (L) 8.9 - 10.3 mg/dL   GFR calc non Af Amer 42 (L) >60 mL/min   GFR calc Af Amer 49 (L) >60 mL/min    Comment: (NOTE) The eGFR has been calculated using the CKD EPI equation. This calculation has not been validated in all clinical situations. eGFR's persistently <60 mL/min signify possible Chronic Kidney Disease.    Anion gap 8 5 - 15    Comment: Performed at [facility], [address] [place], [place] [address]   No components found for: ESR, C REACTIVE PROTEIN MICRO: Recent Results (from the past 720 hour(s))  Blood Culture (routine x 2)     Status: None (Preliminary result)   Collection Time: 03/06/17  4:05 PM  Result Value Ref Range Status   Specimen Description BLOOD RIGHT ANTECUBITAL  Final   Special Requests   Final    BOTTLES DRAWN AEROBIC AND ANAEROBIC Blood Culture results may not be optimal due to an inadequate volume of blood received in culture bottles   Culture   Final    NO GROWTH < 24 HOURS Performed at [facility], [address] [place], [place] [address]    Report Status PENDING  Incomplete  Blood Culture (routine x 2)     Status: None (Preliminary result)   Collection Time:  03/06/17  4:05 PM  Result Value Ref Range Status   Specimen Description BLOOD BLOOD RIGHT ARM  Final   Special Requests   Final    BOTTLES DRAWN AEROBIC AND ANAEROBIC Blood Culture adequate volume   Culture   Final    NO GROWTH < 24 HOURS Performed at [facility], [address] [place], [place] [address]    Report Status PENDING  Incomplete    IMAGING: US Venous Img Lower Unilateral Left  Result Date: 03/06/2017 CLINICAL DATA:  Left lower extremity pain and edema. Concern for cellulitis. Evaluate for DVT. EXAM: LEFT LOWER EXTREMITY VENOUS DOPPLER ULTRASOUND TECHNIQUE: Gray-scale sonography with graded compression, as well as color Doppler and duplex ultrasound were performed to evaluate the lower extremity deep venous systems from the level of the common femoral vein and including the common femoral, femoral, profunda femoral, popliteal and calf veins including the posterior tibial, peroneal and gastrocnemius veins when visible. The superficial great saphenous vein was also interrogated. Spectral Doppler was utilized to evaluate flow at rest and with distal augmentation maneuvers in the common femoral, femoral and popliteal veins. COMPARISON:  Left lower extremity venous Doppler ultrasound - 11/25/2016 FINDINGS: Contralateral Common Femoral Vein: Respiratory phasicity is normal and symmetric with the symptomatic side. No evidence of thrombus. Normal compressibility. Common Femoral Vein: No evidence of thrombus. Normal compressibility,  respiratory phasicity and response to augmentation. Saphenofemoral Junction: No evidence of thrombus. Normal compressibility and flow on color Doppler imaging. Profunda Femoral Vein: No evidence of thrombus. Normal compressibility and flow on color Doppler imaging. Femoral Vein: No evidence of thrombus. Normal compressibility, respiratory phasicity and response to augmentation. Popliteal Vein: No evidence of thrombus. Normal compressibility, respiratory  phasicity and response to augmentation. Calf Veins: Not visualized. Superficial Great Saphenous Vein: No evidence of thrombus. Normal compressibility. Venous Reflux:  None. Other Findings:  None. IMPRESSION: No evidence of DVT within the left lower extremity. Electronically Signed   By: Sandi Mariscal M.D.   On: 03/06/2017 16:55    Assessment:   Urvi Imes is a 84 y.o. female with  hx of obesity, lymphedema, htn and chronic LE wounds admitted with one week hx of L leg pain and infection. Reports recently dced from facility and developed worsening swelling and then skin breakdown. She relates it to poor diet with increased salt after leaving facilitiy. On admit wbc nml, no fevers but LLE quite inflamed and tender. Doppler neg. I have done cx today.  Recommendations Cont vanco and zoysn Culture pending Continue dressing with gauze 4x 4 and kerlix for next 1-2 days so we can monitor the cellulitis. Then agree with unnaboot at dc. Elevate leg on 6 pillows. Will rec otpt oral abx regimen based on culture. Thank you very much for allowing me to participate in the care of this patient. Please call with questions.   Cheral Marker. Ola Spurr, MD

## 2017-03-08 MED ORDER — VITAMIN C 500 MG PO TABS
250.0000 mg | ORAL_TABLET | Freq: Every day | ORAL | Status: DC
  Administered 2017-03-08 – 2017-03-09 (×2): 250 mg via ORAL
  Filled 2017-03-08: qty 0.5
  Filled 2017-03-08: qty 1
  Filled 2017-03-08: qty 0.5

## 2017-03-08 NOTE — Progress Notes (Signed)
[facility] CLINIC INFECTIOUS DISEASE PROGRESS NOTE Date of Admission:  03/06/2017     ID: [name] [name] is a 84 y.o. female with LLE wounds Active Problems:   Cellulitis   Subjective: No fevers. Still with leg pain   ROS  Eleven systems are reviewed and negative except per hpi  Medications:  Antibiotics Given (last 72 hours)    Date/Time Action Medication Dose Rate   03/06/17 1715 New Bag/Given   vancomycin (VANCOCIN) IVPB 1000 mg/200 mL premix 1,000 mg 200 mL/hr   03/06/17 2042 New Bag/Given   piperacillin-tazobactam (ZOSYN) IVPB 3.375 g 3.375 g 12.5 mL/hr   03/06/17 2202 New Bag/Given   vancomycin (VANCOCIN) IVPB 1000 mg/200 mL premix 1,000 mg 200 mL/hr   03/07/17 0424 New Bag/Given   piperacillin-tazobactam (ZOSYN) IVPB 3.375 g 3.375 g 12.5 mL/hr   03/07/17 1321 New Bag/Given   piperacillin-tazobactam (ZOSYN) IVPB 3.375 g 3.375 g 12.5 mL/hr   03/07/17 2120 New Bag/Given   piperacillin-tazobactam (ZOSYN) IVPB 3.375 g 3.375 g 12.5 mL/hr   03/07/17 2333 New Bag/Given   vancomycin (VANCOCIN) IVPB 1000 mg/200 mL premix 1,000 mg 200 mL/hr   03/08/17 0659 New Bag/Given   piperacillin-tazobactam (ZOSYN) IVPB 3.375 g 3.375 g 12.5 mL/hr     . acidophilus  2 capsule Oral TID  . docusate sodium  100 mg Oral BID  . ferrous sulfate  325 mg Oral Daily  . furosemide  20 mg Oral BID  . heparin  5,000 Units Subcutaneous Q8H  . multivitamin with minerals  1 tablet Oral Daily  . sodium chloride flush  3 mL Intravenous Q12H  . vitamin C  250 mg Oral Daily    Objective: Vital signs in last 24 hours: Temp:  [98.5 F (36.9 C)-99.3 F (37.4 C)] 98.6 F (37 C) (02/27 0748) Pulse Rate:  [64-69] 65 (02/27 0748) Resp:  [16-17] 16 (02/27 0748) BP: (112-120)/(45-52) 116/52 (02/27 0748) SpO2:  [94 %-97 %] 97 % (02/27 0748) Weight:  [91.7 kg (202 lb 2.6 oz)-92.9 kg (204 lb 12.9 oz)] 91.7 kg (202 lb 2.6 oz) (02/27 0828) Constitutional:  oriented to person, place, and time. Obese HENT: [place]/AT,  PERRLA, no scleral icterus Mouth/Throat: Oropharynx is clear and moist. No oropharyngeal exudate.  Cardiovascular: Normal rate, regular rhythm and normal heart sounds. Pulmonary/Chest: Effort normal and breath sounds normal. No respiratory distress.  has no wheezes.  Neck = supple, no nuchal rigidity Abdominal: Soft. Bowel sounds are normal.  exhibits no distension. There is no tenderness.  Ext 2+ edema bil LE Lymphadenopathy: no cervical adenopathy. No axillary adenopathy Neurological: alert and oriented to person, place, and time.  Skin: l leg with erythema and several areas of shallow ulceration, very tender and warm Psychiatric: a normal mood and affect.  behavior is normal.   Lab Results Recent Labs    03/06/17 1346 03/07/17 0310  WBC 4.5  --   HGB 9.9*  --   HCT 30.0*  --   NA 137 138  K 5.3* 4.3  CL 104 107  CO2 25 23  BUN 47* 35*  CREATININE 1.38* 1.13*    Microbiology: Results for orders placed or performed during the hospital encounter of 03/06/17  Blood Culture (routine x 2)     Status: None (Preliminary result)   Collection Time: 03/06/17  4:05 PM  Result Value Ref Range Status   Specimen Description BLOOD RIGHT ANTECUBITAL  Final   Special Requests   Final    BOTTLES DRAWN AEROBIC AND ANAEROBIC Blood  Culture results may not be optimal due to an inadequate volume of blood received in culture bottles   Culture   Final    NO GROWTH 2 DAYS Performed at [facility], [address] [place], [place] [address]    Report Status PENDING  Incomplete  Blood Culture (routine x 2)     Status: None (Preliminary result)   Collection Time: 03/06/17  4:05 PM  Result Value Ref Range Status   Specimen Description BLOOD BLOOD RIGHT ARM  Final   Special Requests   Final    BOTTLES DRAWN AEROBIC AND ANAEROBIC Blood Culture adequate volume   Culture   Final    NO GROWTH 2 DAYS Performed at [facility], [address], [place], [place] [address]    Report  Status PENDING  Incomplete  Aerobic Culture (superficial specimen)     Status: None (Preliminary result)   Collection Time: 03/07/17  2:31 PM  Result Value Ref Range Status   Specimen Description WOUND RIGHT LEG  Final   Special Requests NONE  Final   Gram Stain   Final    ABUNDANT WBC PRESENT, PREDOMINANTLY PMN RARE GRAM POSITIVE COCCI RARE GRAM POSITIVE RODS    Culture   Final    CULTURE REINCUBATED FOR BETTER GROWTH Performed at [facility], [address], [place], [place] [address]    Report Status PENDING  Incomplete    Studies/Results: US Venous Img Lower Unilateral Left  Result Date: 03/06/2017 CLINICAL DATA:  Left lower extremity pain and edema. Concern for cellulitis. Evaluate for DVT. EXAM: LEFT LOWER EXTREMITY VENOUS DOPPLER ULTRASOUND TECHNIQUE: Gray-scale sonography with graded compression, as well as color Doppler and duplex ultrasound were performed to evaluate the lower extremity deep venous systems from the level of the common femoral vein and including the common femoral, femoral, profunda femoral, popliteal and calf veins including the posterior tibial, peroneal and gastrocnemius veins when visible. The superficial great saphenous vein was also interrogated. Spectral Doppler was utilized to evaluate flow at rest and with distal augmentation maneuvers in the common femoral, femoral and popliteal veins. COMPARISON:  Left lower extremity venous Doppler ultrasound - 11/25/2016 FINDINGS: Contralateral Common Femoral Vein: Respiratory phasicity is normal and symmetric with the symptomatic side. No evidence of thrombus. Normal compressibility. Common Femoral Vein: No evidence of thrombus. Normal compressibility, respiratory phasicity and response to augmentation. Saphenofemoral Junction: No evidence of thrombus. Normal compressibility and flow on color Doppler imaging. Profunda Femoral Vein: No evidence of thrombus. Normal compressibility and flow on color Doppler imaging.  Femoral Vein: No evidence of thrombus. Normal compressibility, respiratory phasicity and response to augmentation. Popliteal Vein: No evidence of thrombus. Normal compressibility, respiratory phasicity and response to augmentation. Calf Veins: Not visualized. Superficial Great Saphenous Vein: No evidence of thrombus. Normal compressibility. Venous Reflux:  None. Other Findings:  None. IMPRESSION: No evidence of DVT within the left lower extremity. Electronically Signed   By: Simonne Come M.D.   On: 03/06/2017 16:55    Assessment/Plan: [name] [name] is a 84 y.o. female with  hx of obesity, lymphedema, htn and chronic LE wounds admitted with one week hx of L leg pain and infection. Reports recently dced from facility and developed worsening swelling and then skin breakdown. She relates it to poor diet with increased salt after leaving facilitiy. On admit wbc nml, no fevers but LLE quite inflamed and tender. Doppler neg. 2/27- cx pending. Leg a little better.   Recommendations Cont vanco and zoysn Culture  pending Continue dressing with gauze 4x 4 and kerlix for next 1-2 days so we can monitor the cellulitis. Then agree with unnaboot at dc. Elevate leg on 6 pillows. Will rec otpt oral abx regimen based on culture. Can likely dc on oral abx in 1-2 days   Thank you very much for the consult. Will follow with you.  Mick SellDavid P Fitzgerald   03/08/2017, 2:00 PM

## 2017-03-08 NOTE — Progress Notes (Signed)
[facility] -  at [facility]   PATIENT NAME: [name] PatrickDorothy [name]    MR#:  [id number]  DATE OF BIRTH:  [birth year]  SUBJECTIVE:   Patient here due to left leg pain and noted to have left lower extremity cellulitis with lymphedema. Currently on broad-spectrum IV antibiotics and legs wrapped and feels better. Seen by infectious disease and await wound culture results.  REVIEW OF SYSTEMS:    Review of Systems  Constitutional: Negative for chills and fever.  HENT: Negative for congestion and tinnitus.   Eyes: Negative for blurred vision and double vision.  Respiratory: Negative for cough, shortness of breath and wheezing.   Cardiovascular: Negative for chest pain, orthopnea and PND.  Gastrointestinal: Negative for abdominal pain, diarrhea, nausea and vomiting.  Genitourinary: Negative for dysuria and hematuria.  Neurological: Negative for dizziness, sensory change and focal weakness.  All other systems reviewed and are negative.   Nutrition: Heart healthy Tolerating Diet: Yes Tolerating PT: Eval noted.   DRUG ALLERGIES:  No Known Allergies  VITALS:  Blood pressure (!) 116/52, pulse 65, temperature 98.6 F (37 C), temperature source Oral, resp. rate 16, height 5\' 9"  (1.753 m), weight 91.7 kg (202 lb 2.6 oz), SpO2 97 %.  PHYSICAL EXAMINATION:   Physical Exam  GENERAL:  84 y.o.-year-old patient sitting up in chair in no acute distress.  EYES: Pupils equal, round, reactive to light and accommodation. No scleral icterus. Extraocular muscles intact.  HEENT: Head atraumatic, normocephalic. Oropharynx and nasopharynx clear.  NECK:  Supple, no jugular venous distention. No thyroid enlargement, no tenderness.  LUNGS: Normal breath sounds bilaterally, no wheezing, rales, rhonchi. No use of accessory muscles of respiration.  CARDIOVASCULAR: S1, S2 normal. No murmurs, rubs, or gallops.  ABDOMEN: Soft, nontender, nondistended. Bowel sounds present. No organomegaly or  mass.  EXTREMITIES: No cyanosis, clubbing, + 1 edema b/l.  B/L LE wrapped in ACE wrap.  NEUROLOGIC: Cranial nerves II through XII are intact. No focal Motor or sensory deficits b/l.  Globally weak PSYCHIATRIC: The patient is alert and oriented x 3.  SKIN: No obvious rash, lesion, or ulcer.    LABORATORY PANEL:   CBC Recent Labs  Lab 03/06/17 1346  WBC 4.5  HGB 9.9*  HCT 30.0*  PLT 239   ------------------------------------------------------------------------------------------------------------------  Chemistries  Recent Labs  Lab 03/06/17 1346 03/07/17 0310  NA 137 138  K 5.3* 4.3  CL 104 107  CO2 25 23  GLUCOSE 115* 101*  BUN 47* 35*  CREATININE 1.38* 1.13*  CALCIUM 9.5 8.7*  AST 31  --   ALT 8*  --   ALKPHOS 64  --   BILITOT 0.9  --    ------------------------------------------------------------------------------------------------------------------  Cardiac Enzymes No results for input(s): TROPONINI in the last 168 hours. ------------------------------------------------------------------------------------------------------------------  RADIOLOGY:  [place] Venous Img Lower Unilateral Left  Result Date: 03/06/2017 CLINICAL DATA:  Left lower extremity pain and edema. Concern for cellulitis. Evaluate for DVT. EXAM: LEFT LOWER EXTREMITY VENOUS DOPPLER ULTRASOUND TECHNIQUE: Gray-scale sonography with graded compression, as well as color Doppler and duplex ultrasound were performed to evaluate the lower extremity deep venous systems from the level of the common femoral vein and including the common femoral, femoral, profunda femoral, popliteal and calf veins including the posterior tibial, peroneal and gastrocnemius veins when visible. The superficial great saphenous vein was also interrogated. Spectral Doppler was utilized to evaluate flow at rest and with distal augmentation maneuvers in the common femoral, femoral and popliteal veins. COMPARISON:  Left lower extremity venous  Doppler ultrasound - 11/25/2016 FINDINGS: Contralateral Common Femoral Vein: Respiratory phasicity is normal and symmetric with the symptomatic side. No evidence of thrombus. Normal compressibility. Common Femoral Vein: No evidence of thrombus. Normal compressibility, respiratory phasicity and response to augmentation. Saphenofemoral Junction: No evidence of thrombus. Normal compressibility and flow on color Doppler imaging. Profunda Femoral Vein: No evidence of thrombus. Normal compressibility and flow on color Doppler imaging. Femoral Vein: No evidence of thrombus. Normal compressibility, respiratory phasicity and response to augmentation. Popliteal Vein: No evidence of thrombus. Normal compressibility, respiratory phasicity and response to augmentation. Calf Veins: Not visualized. Superficial Great Saphenous Vein: No evidence of thrombus. Normal compressibility. Venous Reflux:  None. Other Findings:  None. IMPRESSION: No evidence of DVT within the left lower extremity. Electronically Signed   By: Simonne Come M.D.   On: 03/06/2017 16:55     ASSESSMENT AND PLAN:   84 year old female with past medical history of previous history of cellulitis, chronic lower extremity edema who presents to the hospital due to left lower extremity redness swelling and pain.  1. Left lower extremity cellulitis with ulceration-recently admitted for similar reasons and discharged on oral antibiotics and now returns back with similar complaints. -Patient seen by infectious disease) cultures obtained and was also which are pending. -Currently afebrile hemoglobin stable. Continue broad-spectrum IV antibiotics with vancomycin, Zosyn. Continue local wound care. -Likely discharged on oral antibiotics and is one to 2 days. -Dopplers are negative for DVT  2. Chronic lower extremity lymphedema-continue Lasix.  3. Anemia of chronic disease - Hg. Stable and will monitor.   4. Generalized Weakness - seen by PT and they recommend  SNF/STR and Social Work aware. Pt. Does not want to go to SNF.   All the records are reviewed and case discussed with Care Management/Social Worker. Management plans discussed with the patient, family and they are in agreement.  CODE STATUS: Full code  DVT Prophylaxis: Hep SQ  TOTAL TIME TAKING CARE OF THIS PATIENT: 30 minutes.   POSSIBLE D/C IN 1-2 DAYS, DEPENDING ON CLINICAL CONDITION.   Houston Siren M.D on 03/08/2017 at 2:27 PM  Between 7am to 6pm - Pager - [phone]  After 6pm go to [email or web address] - Social research officer, government  [facility]  Office  [phone]  CC: Primary care physician; Leotis Shames, MD

## 2017-03-08 NOTE — Evaluation (Signed)
Physical Therapy Evaluation Patient Details Name: [name] [name] MRN: [id number] DOB: 18-Mar-1926 Today's Date: 03/08/2017   History of Present Illness  Pt is a 84 y/o F who presented with acute recurrent LLE cellulitis. BLE neg for DVT. Pt's PMH includes acute on chronic LLE ulcerations and lymphedema.    Clinical Impression  Pt admitted with above diagnosis. Pt currently with functional limitations due to the deficits listed below (see PT Problem List). [name] [name] is from home alone and was just recently d/c from SNF to home (~1.5 wks PTA).  She is at a high risk of falling due to poor posture and RW management when ambulating. She currently requires min assist to perform sit>stand from elevated bed.  Given pt's current mobility status, recommending SNF at d/c. If she is able to have 24/7 assist/supervision at d/c then it would be possible for her to return home with HHPT.  Pt will benefit from skilled PT to increase their independence and safety with mobility to allow discharge to the venue listed below.      Follow Up Recommendations SNF    Equipment Recommendations  Rolling walker with 5" wheels(if pt does not already have a RW)    Recommendations for Other Services       Precautions / Restrictions Precautions Precautions: Fall Restrictions Weight Bearing Restrictions: No      Mobility  Bed Mobility Overal bed mobility: Needs Assistance Bed Mobility: Supine to Sit     Supine to sit: Min guard;HOB elevated     General bed mobility comments: Increased time and effort with HOB elevated.    Transfers Overall transfer level: Needs assistance Equipment used: Rolling walker (2 wheeled) Transfers: Sit to/from Stand Sit to Stand: Min assist;From elevated surface         General transfer comment: Bed elevated as pt unable to stand from lower height.  Pt requires cues for proper hand placement and assist to boost to standing.   Ambulation/Gait Ambulation/Gait assistance:  Min guard Ambulation Distance (Feet): 40 Feet Assistive device: Rolling walker (2 wheeled) Gait Pattern/deviations: Decreased stride length;Trunk flexed;Wide base of support Gait velocity: decreased Gait velocity interpretation: Below normal speed for age/gender General Gait Details: Pt with forward flexed trunk pushing RW too far ahead, improving only temporarily with verbal cues.  Pt bumping into obstacles in room.  Pt keeps Bil knees flexed throughout ambulation.   Stairs            Wheelchair Mobility    Modified Rankin (Stroke Patients Only)       Balance Overall balance assessment: Needs assistance Sitting-balance support: No upper extremity supported;Feet supported Sitting balance-Leahy Scale: Good     Standing balance support: Single extremity supported;During functional activity Standing balance-Leahy Scale: Poor Standing balance comment: Pt relies on at least 1UE support for static and dynamic activities                             Pertinent Vitals/Pain Pain Assessment: No/denies pain    Home Living Family/patient expects to be discharged to:: Private residence Living Arrangements: Alone Available Help at Discharge: Family;Available PRN/intermittently Type of Home: House Home Access: Stairs to enter Entrance Stairs-Rails: None Entrance Stairs-Number of Steps: 1 Home Layout: One level Home Equipment: Walker - 2 wheels;Wheelchair - manual;Cane - single point;Bedside commode;Toilet riser      Prior Function Level of Independence: Needs assistance   Gait / Transfers Assistance Needed: Pt initially reports that she ambulates with  her cane at all times and at times she uses her RW, she reports mainly when she is getting to the car. She then says she uses her WC in her home as well.  No falls in the past 6 months.   ADL's / Homemaking Assistance Needed: Pt independent with cooking, cleaning, showering (sponge bathing). Son and grandson do the  driving.         Hand Dominance        Extremity/Trunk Assessment   Upper Extremity Assessment Upper Extremity Assessment: (BUE strength grossly 4-/5)    Lower Extremity Assessment Lower Extremity Assessment: (redness, swelling BLE.  Strength grossly 4-/5 BLE)    Cervical / Trunk Assessment Cervical / Trunk Assessment: Kyphotic  Communication   Communication: No difficulties  Cognition Arousal/Alertness: Awake/alert Behavior During Therapy: WFL for tasks assessed/performed Overall Cognitive Status: No family/caregiver present to determine baseline cognitive functioning                                 General Comments: Pt provides contradictory information regarding home layout and equipment.  Requires cues for mobility.       General Comments      Exercises General Exercises - Upper Extremity Shoulder Flexion: AROM;Both;10 reps;Supine General Exercises - Lower Extremity Long Arc Quad: Both;10 reps;Seated Hip Flexion/Marching: Both;10 reps;Seated   Assessment/Plan    PT Assessment Patient needs continued PT services  PT Problem List Decreased strength;Decreased activity tolerance;Decreased balance;Decreased knowledge of use of DME;Decreased cognition;Decreased safety awareness       PT Treatment Interventions DME instruction;Gait training;Stair training;Functional mobility training;Therapeutic activities;Therapeutic exercise;Balance training;Neuromuscular re-education;Cognitive remediation;Patient/family education;Wheelchair mobility training    PT Goals (Current goals can be found in the Care Plan section)  Acute Rehab PT Goals Patient Stated Goal: to return to PLOF PT Goal Formulation: With patient Time For Goal Achievement: 03/22/17 Potential to Achieve Goals: Good    Frequency Min 2X/week   Barriers to discharge Inaccessible home environment;Decreased caregiver support Step to enter home and pt lives alone    Co-evaluation                AM-PAC PT "6 Clicks" Daily Activity  Outcome Measure Difficulty turning over in bed (including adjusting bedclothes, sheets and blankets)?: A Lot Difficulty moving from lying on back to sitting on the side of the bed? : A Lot Difficulty sitting down on and standing up from a chair with arms (e.g., wheelchair, bedside commode, etc,.)?: Unable Help needed moving to and from a bed to chair (including a wheelchair)?: A Little Help needed walking in hospital room?: A Little Help needed climbing 3-5 steps with a railing? : A Lot 6 Click Score: 13    End of Session Equipment Utilized During Treatment: Gait belt Activity Tolerance: Patient tolerated treatment well;Patient limited by fatigue Patient left: in chair;with call bell/phone within reach;with chair alarm set Nurse Communication: Mobility status PT Visit Diagnosis: Unsteadiness on feet (R26.81);Other abnormalities of gait and mobility (R26.89);Muscle weakness (generalized) (M62.81)    Time: 4098-1191 PT Time Calculation (min) (ACUTE ONLY): 28 min   Charges:   PT Evaluation $PT Eval Low Complexity: 1 Low PT Treatments $Gait Training: 8-22 mins   PT G Codes:        Encarnacion Chu PT, DPT 03/08/2017, 10:59 AM

## 2017-03-08 NOTE — Progress Notes (Signed)
PT is recommending SNF. Clinical Education officer, museum (CSW) met with patient and presented bed offers. Patient reported that she had a good experience at [facility] and would like to go back there. Per [facility] admissions coordinator at [employer] she will start [facility] authorization. CSW contacted patient's son [name] and made him aware of above.   [employer], LCSW [phone]

## 2017-03-08 NOTE — Progress Notes (Signed)
Pt transferred back to bed with 1 assist with walker.

## 2017-03-09 DIAGNOSIS — I890 Lymphedema, not elsewhere classified: Secondary | ICD-10-CM | POA: Diagnosis not present

## 2017-03-09 DIAGNOSIS — D638 Anemia in other chronic diseases classified elsewhere: Secondary | ICD-10-CM | POA: Diagnosis not present

## 2017-03-09 DIAGNOSIS — D649 Anemia, unspecified: Secondary | ICD-10-CM | POA: Diagnosis not present

## 2017-03-09 DIAGNOSIS — L03116 Cellulitis of left lower limb: Secondary | ICD-10-CM | POA: Diagnosis not present

## 2017-03-09 DIAGNOSIS — Z7401 Bed confinement status: Secondary | ICD-10-CM | POA: Diagnosis not present

## 2017-03-09 DIAGNOSIS — R41841 Cognitive communication deficit: Secondary | ICD-10-CM | POA: Diagnosis not present

## 2017-03-09 DIAGNOSIS — I10 Essential (primary) hypertension: Secondary | ICD-10-CM | POA: Diagnosis not present

## 2017-03-09 DIAGNOSIS — E876 Hypokalemia: Secondary | ICD-10-CM | POA: Diagnosis not present

## 2017-03-09 DIAGNOSIS — R262 Difficulty in walking, not elsewhere classified: Secondary | ICD-10-CM | POA: Diagnosis not present

## 2017-03-09 DIAGNOSIS — E875 Hyperkalemia: Secondary | ICD-10-CM | POA: Diagnosis not present

## 2017-03-09 DIAGNOSIS — N179 Acute kidney failure, unspecified: Secondary | ICD-10-CM | POA: Diagnosis not present

## 2017-03-09 DIAGNOSIS — L97929 Non-pressure chronic ulcer of unspecified part of left lower leg with unspecified severity: Secondary | ICD-10-CM | POA: Diagnosis not present

## 2017-03-09 DIAGNOSIS — L89893 Pressure ulcer of other site, stage 3: Secondary | ICD-10-CM | POA: Diagnosis not present

## 2017-03-09 DIAGNOSIS — R4182 Altered mental status, unspecified: Secondary | ICD-10-CM | POA: Diagnosis not present

## 2017-03-09 DIAGNOSIS — M6281 Muscle weakness (generalized): Secondary | ICD-10-CM | POA: Diagnosis not present

## 2017-03-09 LAB — CBC
HEMATOCRIT: 30 % — AB (ref 35.0–47.0)
HEMOGLOBIN: 9.7 g/dL — AB (ref 12.0–16.0)
MCH: 28.9 pg (ref 26.0–34.0)
MCHC: 32.4 g/dL (ref 32.0–36.0)
MCV: 89.3 fL (ref 80.0–100.0)
Platelets: 193 10*3/uL (ref 150–440)
RBC: 3.36 MIL/uL — ABNORMAL LOW (ref 3.80–5.20)
RDW: 16.2 % — ABNORMAL HIGH (ref 11.5–14.5)
WBC: 5.1 10*3/uL (ref 3.6–11.0)

## 2017-03-09 MED ORDER — AMOXICILLIN-POT CLAVULANATE 875-125 MG PO TABS: 1.0000 | ORAL_TABLET | Freq: Two times a day (BID) | ORAL | 0 refills | Status: AC

## 2017-03-09 NOTE — Progress Notes (Signed)
EMS here to transport pt to [facility].

## 2017-03-09 NOTE — Progress Notes (Signed)
Report called and given Shant[place] at [facility]. EMS called for transport. IV removed. Will assist pt with getting dressed.

## 2017-03-09 NOTE — Progress Notes (Signed)
Unna boot applied to left foot.

## 2017-03-09 NOTE — Progress Notes (Signed)
Patient is medically stable for D/C to [facility] today. [facility] [employer] SN[facility] authorization has been received and patient has 19 SNF days left. RN will call report and arrange EMS for transport. Clinical Child psychotherapistocial Worker (CSW) sent D/C orders to [facility] via [facility]. Patient is aware of above. Patient's grandson Asher MuirJamie is at bedside and aware of above. CSW contacted patient's son Dorene SorrowJerry and made him aware of above. Please reconsult if future social work needs arise. CSW signing off.   [employer]iley Makhi Muzquiz, LCSW [phone]

## 2017-03-09 NOTE — Clinical Social Work Placement (Signed)
   CLINICAL SOCIAL WORK PLACEMENT  NOTE  Date:  03/09/2017  Patient Details  Name: [name] [name] MRN: [id number] Date of Birth: 1926/09/06  Clinical Social Work is seeking post-discharge placement for this patient at the Skilled  Nursing Facility level of care (*CSW will initial, date and re-position this form in  chart as items are completed):  Yes   Patient/family provided with [facility] Clinical Social Work Department's list of facilities offering this level of care within the geographic area requested by the patient (or if unable, by the patient's family).  Yes   Patient/family informed of their freedom to choose among providers that offer the needed level of care, that participate in Medicare, Medicaid or managed care program needed by the patient, have an available bed and are willing to accept the patient.  Yes   Patient/family informed of [facility]'s ownership interest in [facility] and [facility], as well as of the fact that they are under no obligation to receive care at these facilities.  PASRR submitted to EDS on       PASRR number received on       Existing PASRR number confirmed on 03/07/17     FL2 transmitted to all facilities in geographic area requested by pt/family on 03/07/17     FL2 transmitted to all facilities within larger geographic area on       Patient informed that his/her managed care company has contracts with or will negotiate with certain facilities, including the following:        Yes   Patient/family informed of bed offers received.  Patient [facility] bed at [facility] )     Physician recommends and patient chooses bed at      Patient to be transferred to [facility] ) on 03/09/17.  [facility]     Patient family notified on 03/09/17 of transfer.  Name of family member notified:  (Patient's son Dorene SorrowJerry and grandson Asher MuirJamie are aware of D/C today. )      PHYSICIAN       Additional Comment:    _______________________________________________ Jorgina Binning, Darleen CrockerBailey M, LCSW 03/09/2017, 12:54 PM

## 2017-03-09 NOTE — Discharge Summary (Signed)
[facility] [place] [facility]   PATIENT NAME: [name] [name]    MR#:  [id number]  DATE OF BIRTH:  October 18, 1926  DATE OF ADMISSION:  03/06/2017 ADMITTING PHYSICIAN: Bertrum Sol, MD  DATE OF DISCHARGE: 03/09/2017  PRIMARY CARE PHYSICIAN: Leotis Shames, MD    ADMISSION DIAGNOSIS:  Acute kidney injury (nontraumatic) (HCC) [N17.9] Cellulitis of left lower extremity without foot [L03.116]  DISCHARGE DIAGNOSIS:  Active Problems:   Cellulitis   SECONDARY DIAGNOSIS:   Past Medical History:  Diagnosis Date  . Cellulitis 10/28/2016   bilateral lower extremities.     HOSPITAL COURSE:   84 year old female with past medical history of previous history of cellulitis, chronic lower extremity edema who presents to the hospital due to left lower extremity redness swelling and pain.  1. Left lower extremity cellulitis with ulceration-recently admitted for similar reasons and discharged on oral antibiotics and returned back with similar complaints. -Patient was seen by infectious disease and cultures obtained and they are still pending.  -Clinically patient has improved with less pain and swelling.  She was empirically on IV vancomycin, Zosyn.  Discussed with infectious disease and will place the patient on oral Augmentin for 14 days, place Unna boots and have the patient follow-up with infectious disease in the next 10 days.  Patient is currently afebrile and hemodynamically stable. -Patient's Unna boot should be removed every 5-7 days at the skilled nursing facility.  2. Chronic lower extremity lymphedema- she will continue Lasix.  3. Anemia of chronic disease - her Hg. remained Stable and can be further followed as outpatient.    4. Generalized Weakness - seen by PT and they recommend SNF/STR and pt. Is agreement and will discharger her there today.    DISCHARGE CONDITIONS:   Stable.   CONSULTS OBTAINED:  Treatment Team:  Mick Sell,  MD  DRUG ALLERGIES:  No Known Allergies  DISCHARGE MEDICATIONS:   Allergies as of 03/09/2017   No Known Allergies     Medication List    TAKE these medications   acetaminophen 325 MG tablet Commonly known as:  TYLENOL Take 2 tablets (650 mg total) every 6 (six) hours as needed by mouth for mild pain (or Fever >/= 101).   acidophilus Caps capsule Take 2 capsules 3 (three) times daily by mouth.   amoxicillin-clavulanate 875-125 MG tablet Commonly known as:  AUGMENTIN Take 1 tablet by mouth 2 (two) times daily for 14 days.   ascorbic acid 100 MG tablet Commonly known as:  VITAMIN C Take 1 tablet (100 mg total) by mouth daily.   ferrous sulfate 325 (65 FE) MG tablet Take 1 tablet (325 mg total) by mouth daily.   furosemide 20 MG tablet Commonly known as:  LASIX Take 1 tablet (20 mg total) by mouth daily as needed for edema. What changed:  when to take this   MULTIVITAL tablet Take 1 tablet by mouth daily.   potassium chloride 10 MEQ tablet Commonly known as:  K-DUR,KLOR-CON Take 10 mEq by mouth daily.         DISCHARGE INSTRUCTIONS:   DIET:  Cardiac diet  DISCHARGE CONDITION:  Stable  ACTIVITY:  Activity as tolerated  OXYGEN:  Home Oxygen: No.   Oxygen Delivery: room air  DISCHARGE LOCATION:  nursing home   If you experience worsening of your admission symptoms, develop shortness of breath, life threatening emergency, suicidal or homicidal thoughts you must seek medical attention immediately by calling 911 or calling your MD immediately  if symptoms less severe.  You Must read complete instructions/literature along with all the possible adverse reactions/side effects for all the Medicines you take and that have been prescribed to you. Take any new Medicines after you have completely understood and accpet all the possible adverse reactions/side effects.   Please note  You were cared for by a hospitalist during your hospital stay. If you have any  questions about your discharge medications or the care you received while you were in the hospital after you are discharged, you can call the unit and asked to speak with the hospitalist on call if the hospitalist that took care of you is not available. Once you are discharged, your primary care physician will handle any further medical issues. Please note that NO REFILLS for any discharge medications will be authorized once you are discharged, as it is imperative that you return to your primary care physician (or establish a relationship with a primary care physician if you do not have one) for your aftercare needs so that they can reassess your need for medications and monitor your lab values.     Today   Left lower ext. Redness/swelling pain has improved.  Afebrile, hemodynamically stable.   VITAL SIGNS:  Blood pressure (!) 122/58, pulse 72, temperature 98.8 F (37.1 C), temperature source Oral, resp. rate 18, height 5\' 9"  (1.753 m), weight 93.8 kg (206 lb 12.7 oz), SpO2 96 %.  I/O:    Intake/Output Summary (Last 24 hours) at 03/09/2017 1148 Last data filed at 03/09/2017 1045 Gross per 24 hour  Intake 720 ml  Output 700 ml  Net 20 ml    PHYSICAL EXAMINATION:   GENERAL:  84 y.o.-year-old patient sitting up in chair in no acute distress.  EYES: Pupils equal, round, reactive to light and accommodation. No scleral icterus. Extraocular muscles intact.  HEENT: Head atraumatic, normocephalic. Oropharynx and nasopharynx clear.  NECK:  Supple, no jugular venous distention. No thyroid enlargement, no tenderness.  LUNGS: Normal breath sounds bilaterally, no wheezing, rales, rhonchi. No use of accessory muscles of respiration.  CARDIOVASCULAR: S1, S2 normal. No murmurs, rubs, or gallops.  ABDOMEN: Soft, nontender, nondistended. Bowel sounds present. No organomegaly or mass.  EXTREMITIES: No cyanosis, clubbing, + 1 edema b/l.  LLE wrapped in Kerlex.  No drainage noted.  NEUROLOGIC: Cranial  nerves II through XII are intact. No focal Motor or sensory deficits b/l.  Globally weak PSYCHIATRIC: The patient is alert and oriented x 3.  SKIN: No obvious rash, lesion, or ulcer.     DATA REVIEW:   CBC Recent Labs  Lab 03/09/17 0344  WBC 5.1  HGB 9.7*  HCT 30.0*  PLT 193    Chemistries  Recent Labs  Lab 03/06/17 1346 03/07/17 0310  NA 137 138  K 5.3* 4.3  CL 104 107  CO2 25 23  GLUCOSE 115* 101*  BUN 47* 35*  CREATININE 1.38* 1.13*  CALCIUM 9.5 8.7*  AST 31  --   ALT 8*  --   ALKPHOS 64  --   BILITOT 0.9  --     Cardiac Enzymes No results for input(s): TROPONINI in the last 168 hours.  Microbiology Results  Results for orders placed or performed during the hospital encounter of 03/06/17  Blood Culture (routine x 2)     Status: None (Preliminary result)   Collection Time: 03/06/17  4:05 PM  Result Value Ref Range Status   Specimen Description BLOOD RIGHT ANTECUBITAL  Final   Special Requests  Final    BOTTLES DRAWN AEROBIC AND ANAEROBIC Blood Culture results may not be optimal due to an inadequate volume of blood received in culture bottles   Culture   Final    NO GROWTH 3 DAYS Performed at [facility], [address] [place], [place] [address]    Report Status PENDING  Incomplete  Blood Culture (routine x 2)     Status: None (Preliminary result)   Collection Time: 03/06/17  4:05 PM  Result Value Ref Range Status   Specimen Description BLOOD BLOOD RIGHT ARM  Final   Special Requests   Final    BOTTLES DRAWN AEROBIC AND ANAEROBIC Blood Culture adequate volume   Culture   Final    NO GROWTH 3 DAYS Performed at [facility], [address] [place], [place] [address]    Report Status PENDING  Incomplete  Aerobic Culture (superficial specimen)     Status: None (Preliminary result)   Collection Time: 03/07/17  2:31 PM  Result Value Ref Range Status   Specimen Description WOUND RIGHT LEG  Final   Special Requests NONE  Final    Gram Stain   Final    ABUNDANT WBC PRESENT, PREDOMINANTLY PMN RARE GRAM POSITIVE COCCI RARE GRAM POSITIVE RODS    Culture   Final    CULTURE REINCUBATED FOR BETTER GROWTH Performed at [facility], [address] [place], [place] [address]    Report Status PENDING  Incomplete    RADIOLOGY:  No results found.    Management plans discussed with the patient, family and they are in agreement.  CODE STATUS:     Code Status Orders  (From admission, onward)        Start     Ordered   03/06/17 1959  Full code  Continuous     03/06/17 1958    TOTAL TIME TAKING CARE OF THIS PATIENT: 40 minutes.    Houston Siren M.D on 03/09/2017 at 11:48 AM  Between 7am to 6pm - Pager - [phone]  After 6pm go to [email or web address] - Social research officer, government  [facility] [place] [facility]  Office  [phone]  CC: Primary care physician; Leotis Shames, MD

## 2017-03-10 ENCOUNTER — Telehealth: Payer: Self-pay | Admitting: Infectious Diseases

## 2017-03-10 DIAGNOSIS — D649 Anemia, unspecified: Secondary | ICD-10-CM | POA: Diagnosis not present

## 2017-03-10 DIAGNOSIS — L03116 Cellulitis of left lower limb: Secondary | ICD-10-CM | POA: Diagnosis not present

## 2017-03-10 LAB — AEROBIC CULTURE  (SUPERFICIAL SPECIMEN)

## 2017-03-10 LAB — AEROBIC CULTURE W GRAM STAIN (SUPERFICIAL SPECIMEN)

## 2017-03-10 NOTE — Telephone Encounter (Signed)
-----   Message from Tessie FassJeremy J Frens, [facility]PH sent at 03/10/2017  2:52 PM EST ----- Theodoro Gristave - this patient went out on augmentin, but MRSA grew.  Not sure if this culture is helpful or just a swab.   Could add doxy here. Riki RuskJeremy

## 2017-03-10 NOTE — Telephone Encounter (Signed)
cx with MRSA I called [facility] to rec stop augmentin and start doxy 100 mg bid for 21 days Spoke with AmerisourceBergen CorporationShantique Ford

## 2017-03-11 LAB — CULTURE, BLOOD (ROUTINE X 2)
CULTURE: NO GROWTH
CULTURE: NO GROWTH
Special Requests: ADEQUATE

## 2017-03-22 DIAGNOSIS — L89893 Pressure ulcer of other site, stage 3: Secondary | ICD-10-CM | POA: Diagnosis not present

## 2017-03-22 DIAGNOSIS — L03116 Cellulitis of left lower limb: Secondary | ICD-10-CM | POA: Diagnosis not present

## 2017-03-23 DIAGNOSIS — L97929 Non-pressure chronic ulcer of unspecified part of left lower leg with unspecified severity: Secondary | ICD-10-CM | POA: Diagnosis not present

## 2017-03-23 DIAGNOSIS — L03116 Cellulitis of left lower limb: Secondary | ICD-10-CM | POA: Diagnosis not present

## 2017-03-23 DIAGNOSIS — I890 Lymphedema, not elsewhere classified: Secondary | ICD-10-CM | POA: Diagnosis not present

## 2017-03-23 DIAGNOSIS — D649 Anemia, unspecified: Secondary | ICD-10-CM | POA: Diagnosis not present

## 2017-03-29 DIAGNOSIS — D638 Anemia in other chronic diseases classified elsewhere: Secondary | ICD-10-CM | POA: Diagnosis not present

## 2017-03-29 DIAGNOSIS — M6281 Muscle weakness (generalized): Secondary | ICD-10-CM | POA: Diagnosis not present

## 2017-03-29 DIAGNOSIS — R2689 Other abnormalities of gait and mobility: Secondary | ICD-10-CM | POA: Diagnosis not present

## 2017-03-29 DIAGNOSIS — L97923 Non-pressure chronic ulcer of unspecified part of left lower leg with necrosis of muscle: Secondary | ICD-10-CM | POA: Diagnosis not present

## 2017-04-01 DIAGNOSIS — M6281 Muscle weakness (generalized): Secondary | ICD-10-CM | POA: Diagnosis not present

## 2017-04-01 DIAGNOSIS — D638 Anemia in other chronic diseases classified elsewhere: Secondary | ICD-10-CM | POA: Diagnosis not present

## 2017-04-01 DIAGNOSIS — R2689 Other abnormalities of gait and mobility: Secondary | ICD-10-CM | POA: Diagnosis not present

## 2017-04-01 DIAGNOSIS — L97923 Non-pressure chronic ulcer of unspecified part of left lower leg with necrosis of muscle: Secondary | ICD-10-CM | POA: Diagnosis not present

## 2017-04-03 DIAGNOSIS — M6281 Muscle weakness (generalized): Secondary | ICD-10-CM | POA: Diagnosis not present

## 2017-04-03 DIAGNOSIS — D638 Anemia in other chronic diseases classified elsewhere: Secondary | ICD-10-CM | POA: Diagnosis not present

## 2017-04-03 DIAGNOSIS — L97923 Non-pressure chronic ulcer of unspecified part of left lower leg with necrosis of muscle: Secondary | ICD-10-CM | POA: Diagnosis not present

## 2017-04-03 DIAGNOSIS — R2689 Other abnormalities of gait and mobility: Secondary | ICD-10-CM | POA: Diagnosis not present

## 2017-04-04 DIAGNOSIS — M6281 Muscle weakness (generalized): Secondary | ICD-10-CM | POA: Diagnosis not present

## 2017-04-04 DIAGNOSIS — R2689 Other abnormalities of gait and mobility: Secondary | ICD-10-CM | POA: Diagnosis not present

## 2017-04-04 DIAGNOSIS — D638 Anemia in other chronic diseases classified elsewhere: Secondary | ICD-10-CM | POA: Diagnosis not present

## 2017-04-04 DIAGNOSIS — L97923 Non-pressure chronic ulcer of unspecified part of left lower leg with necrosis of muscle: Secondary | ICD-10-CM | POA: Diagnosis not present

## 2017-04-05 DIAGNOSIS — D638 Anemia in other chronic diseases classified elsewhere: Secondary | ICD-10-CM | POA: Diagnosis not present

## 2017-04-05 DIAGNOSIS — M6281 Muscle weakness (generalized): Secondary | ICD-10-CM | POA: Diagnosis not present

## 2017-04-05 DIAGNOSIS — L97923 Non-pressure chronic ulcer of unspecified part of left lower leg with necrosis of muscle: Secondary | ICD-10-CM | POA: Diagnosis not present

## 2017-04-05 DIAGNOSIS — R2689 Other abnormalities of gait and mobility: Secondary | ICD-10-CM | POA: Diagnosis not present

## 2017-04-07 DIAGNOSIS — R2689 Other abnormalities of gait and mobility: Secondary | ICD-10-CM | POA: Diagnosis not present

## 2017-04-07 DIAGNOSIS — L97923 Non-pressure chronic ulcer of unspecified part of left lower leg with necrosis of muscle: Secondary | ICD-10-CM | POA: Diagnosis not present

## 2017-04-07 DIAGNOSIS — M6281 Muscle weakness (generalized): Secondary | ICD-10-CM | POA: Diagnosis not present

## 2017-04-07 DIAGNOSIS — D638 Anemia in other chronic diseases classified elsewhere: Secondary | ICD-10-CM | POA: Diagnosis not present

## 2017-04-10 DIAGNOSIS — D638 Anemia in other chronic diseases classified elsewhere: Secondary | ICD-10-CM | POA: Diagnosis not present

## 2017-04-10 DIAGNOSIS — R2689 Other abnormalities of gait and mobility: Secondary | ICD-10-CM | POA: Diagnosis not present

## 2017-04-10 DIAGNOSIS — M6281 Muscle weakness (generalized): Secondary | ICD-10-CM | POA: Diagnosis not present

## 2017-04-10 DIAGNOSIS — L97923 Non-pressure chronic ulcer of unspecified part of left lower leg with necrosis of muscle: Secondary | ICD-10-CM | POA: Diagnosis not present

## 2017-04-10 DIAGNOSIS — I10 Essential (primary) hypertension: Secondary | ICD-10-CM | POA: Diagnosis not present

## 2017-04-10 DIAGNOSIS — R41841 Cognitive communication deficit: Secondary | ICD-10-CM | POA: Diagnosis not present

## 2017-04-11 DIAGNOSIS — L03116 Cellulitis of left lower limb: Secondary | ICD-10-CM | POA: Diagnosis not present

## 2017-04-12 DIAGNOSIS — L97923 Non-pressure chronic ulcer of unspecified part of left lower leg with necrosis of muscle: Secondary | ICD-10-CM | POA: Diagnosis not present

## 2017-04-12 DIAGNOSIS — R2689 Other abnormalities of gait and mobility: Secondary | ICD-10-CM | POA: Diagnosis not present

## 2017-04-12 DIAGNOSIS — R41841 Cognitive communication deficit: Secondary | ICD-10-CM | POA: Diagnosis not present

## 2017-04-12 DIAGNOSIS — M6281 Muscle weakness (generalized): Secondary | ICD-10-CM | POA: Diagnosis not present

## 2017-04-12 DIAGNOSIS — D638 Anemia in other chronic diseases classified elsewhere: Secondary | ICD-10-CM | POA: Diagnosis not present

## 2017-04-12 DIAGNOSIS — I10 Essential (primary) hypertension: Secondary | ICD-10-CM | POA: Diagnosis not present

## 2017-04-14 DIAGNOSIS — L97923 Non-pressure chronic ulcer of unspecified part of left lower leg with necrosis of muscle: Secondary | ICD-10-CM | POA: Diagnosis not present

## 2017-04-14 DIAGNOSIS — I10 Essential (primary) hypertension: Secondary | ICD-10-CM | POA: Diagnosis not present

## 2017-04-14 DIAGNOSIS — R2689 Other abnormalities of gait and mobility: Secondary | ICD-10-CM | POA: Diagnosis not present

## 2017-04-14 DIAGNOSIS — R41841 Cognitive communication deficit: Secondary | ICD-10-CM | POA: Diagnosis not present

## 2017-04-14 DIAGNOSIS — D638 Anemia in other chronic diseases classified elsewhere: Secondary | ICD-10-CM | POA: Diagnosis not present

## 2017-04-14 DIAGNOSIS — M6281 Muscle weakness (generalized): Secondary | ICD-10-CM | POA: Diagnosis not present

## 2017-04-21 DIAGNOSIS — D638 Anemia in other chronic diseases classified elsewhere: Secondary | ICD-10-CM | POA: Diagnosis not present

## 2017-04-21 DIAGNOSIS — M6281 Muscle weakness (generalized): Secondary | ICD-10-CM | POA: Diagnosis not present

## 2017-04-21 DIAGNOSIS — L97923 Non-pressure chronic ulcer of unspecified part of left lower leg with necrosis of muscle: Secondary | ICD-10-CM | POA: Diagnosis not present

## 2017-04-21 DIAGNOSIS — R2689 Other abnormalities of gait and mobility: Secondary | ICD-10-CM | POA: Diagnosis not present

## 2017-04-21 DIAGNOSIS — I10 Essential (primary) hypertension: Secondary | ICD-10-CM | POA: Diagnosis not present

## 2017-04-21 DIAGNOSIS — R41841 Cognitive communication deficit: Secondary | ICD-10-CM | POA: Diagnosis not present

## 2017-04-25 DIAGNOSIS — D638 Anemia in other chronic diseases classified elsewhere: Secondary | ICD-10-CM | POA: Diagnosis not present

## 2017-04-25 DIAGNOSIS — R2689 Other abnormalities of gait and mobility: Secondary | ICD-10-CM | POA: Diagnosis not present

## 2017-04-25 DIAGNOSIS — M6281 Muscle weakness (generalized): Secondary | ICD-10-CM | POA: Diagnosis not present

## 2017-04-25 DIAGNOSIS — R41841 Cognitive communication deficit: Secondary | ICD-10-CM | POA: Diagnosis not present

## 2017-04-25 DIAGNOSIS — L97923 Non-pressure chronic ulcer of unspecified part of left lower leg with necrosis of muscle: Secondary | ICD-10-CM | POA: Diagnosis not present

## 2017-04-25 DIAGNOSIS — I10 Essential (primary) hypertension: Secondary | ICD-10-CM | POA: Diagnosis not present

## 2017-04-27 DIAGNOSIS — R2689 Other abnormalities of gait and mobility: Secondary | ICD-10-CM | POA: Diagnosis not present

## 2017-04-27 DIAGNOSIS — I10 Essential (primary) hypertension: Secondary | ICD-10-CM | POA: Diagnosis not present

## 2017-04-27 DIAGNOSIS — R41841 Cognitive communication deficit: Secondary | ICD-10-CM | POA: Diagnosis not present

## 2017-04-27 DIAGNOSIS — M6281 Muscle weakness (generalized): Secondary | ICD-10-CM | POA: Diagnosis not present

## 2017-04-27 DIAGNOSIS — L97923 Non-pressure chronic ulcer of unspecified part of left lower leg with necrosis of muscle: Secondary | ICD-10-CM | POA: Diagnosis not present

## 2017-04-27 DIAGNOSIS — D638 Anemia in other chronic diseases classified elsewhere: Secondary | ICD-10-CM | POA: Diagnosis not present

## 2017-05-02 DIAGNOSIS — R41841 Cognitive communication deficit: Secondary | ICD-10-CM | POA: Diagnosis not present

## 2017-05-02 DIAGNOSIS — R2689 Other abnormalities of gait and mobility: Secondary | ICD-10-CM | POA: Diagnosis not present

## 2017-05-02 DIAGNOSIS — L97923 Non-pressure chronic ulcer of unspecified part of left lower leg with necrosis of muscle: Secondary | ICD-10-CM | POA: Diagnosis not present

## 2017-05-02 DIAGNOSIS — I10 Essential (primary) hypertension: Secondary | ICD-10-CM | POA: Diagnosis not present

## 2017-05-02 DIAGNOSIS — D638 Anemia in other chronic diseases classified elsewhere: Secondary | ICD-10-CM | POA: Diagnosis not present

## 2017-05-02 DIAGNOSIS — M6281 Muscle weakness (generalized): Secondary | ICD-10-CM | POA: Diagnosis not present

## 2017-05-09 DIAGNOSIS — M6281 Muscle weakness (generalized): Secondary | ICD-10-CM | POA: Diagnosis not present

## 2017-05-09 DIAGNOSIS — I10 Essential (primary) hypertension: Secondary | ICD-10-CM | POA: Diagnosis not present

## 2017-05-09 DIAGNOSIS — R41841 Cognitive communication deficit: Secondary | ICD-10-CM | POA: Diagnosis not present

## 2017-05-09 DIAGNOSIS — L97923 Non-pressure chronic ulcer of unspecified part of left lower leg with necrosis of muscle: Secondary | ICD-10-CM | POA: Diagnosis not present

## 2017-05-09 DIAGNOSIS — R2689 Other abnormalities of gait and mobility: Secondary | ICD-10-CM | POA: Diagnosis not present

## 2017-05-09 DIAGNOSIS — D638 Anemia in other chronic diseases classified elsewhere: Secondary | ICD-10-CM | POA: Diagnosis not present

## 2017-05-11 DIAGNOSIS — R41841 Cognitive communication deficit: Secondary | ICD-10-CM | POA: Diagnosis not present

## 2017-05-11 DIAGNOSIS — D638 Anemia in other chronic diseases classified elsewhere: Secondary | ICD-10-CM | POA: Diagnosis not present

## 2017-05-11 DIAGNOSIS — M6281 Muscle weakness (generalized): Secondary | ICD-10-CM | POA: Diagnosis not present

## 2017-05-11 DIAGNOSIS — L97923 Non-pressure chronic ulcer of unspecified part of left lower leg with necrosis of muscle: Secondary | ICD-10-CM | POA: Diagnosis not present

## 2017-05-11 DIAGNOSIS — I10 Essential (primary) hypertension: Secondary | ICD-10-CM | POA: Diagnosis not present

## 2017-05-11 DIAGNOSIS — R2689 Other abnormalities of gait and mobility: Secondary | ICD-10-CM | POA: Diagnosis not present

## 2017-05-16 DIAGNOSIS — D638 Anemia in other chronic diseases classified elsewhere: Secondary | ICD-10-CM | POA: Diagnosis not present

## 2017-05-16 DIAGNOSIS — L97923 Non-pressure chronic ulcer of unspecified part of left lower leg with necrosis of muscle: Secondary | ICD-10-CM | POA: Diagnosis not present

## 2017-05-16 DIAGNOSIS — M6281 Muscle weakness (generalized): Secondary | ICD-10-CM | POA: Diagnosis not present

## 2017-05-16 DIAGNOSIS — I10 Essential (primary) hypertension: Secondary | ICD-10-CM | POA: Diagnosis not present

## 2017-05-16 DIAGNOSIS — R41841 Cognitive communication deficit: Secondary | ICD-10-CM | POA: Diagnosis not present

## 2017-05-16 DIAGNOSIS — R2689 Other abnormalities of gait and mobility: Secondary | ICD-10-CM | POA: Diagnosis not present

## 2017-05-17 DIAGNOSIS — I10 Essential (primary) hypertension: Secondary | ICD-10-CM | POA: Diagnosis not present

## 2017-05-17 DIAGNOSIS — L97923 Non-pressure chronic ulcer of unspecified part of left lower leg with necrosis of muscle: Secondary | ICD-10-CM | POA: Diagnosis not present

## 2017-05-17 DIAGNOSIS — D638 Anemia in other chronic diseases classified elsewhere: Secondary | ICD-10-CM | POA: Diagnosis not present

## 2017-05-17 DIAGNOSIS — R41841 Cognitive communication deficit: Secondary | ICD-10-CM | POA: Diagnosis not present

## 2017-05-17 DIAGNOSIS — R2689 Other abnormalities of gait and mobility: Secondary | ICD-10-CM | POA: Diagnosis not present

## 2017-05-17 DIAGNOSIS — M6281 Muscle weakness (generalized): Secondary | ICD-10-CM | POA: Diagnosis not present

## 2017-05-18 DIAGNOSIS — M6281 Muscle weakness (generalized): Secondary | ICD-10-CM | POA: Diagnosis not present

## 2017-05-18 DIAGNOSIS — D638 Anemia in other chronic diseases classified elsewhere: Secondary | ICD-10-CM | POA: Diagnosis not present

## 2017-05-18 DIAGNOSIS — R2689 Other abnormalities of gait and mobility: Secondary | ICD-10-CM | POA: Diagnosis not present

## 2017-05-18 DIAGNOSIS — I10 Essential (primary) hypertension: Secondary | ICD-10-CM | POA: Diagnosis not present

## 2017-05-18 DIAGNOSIS — R41841 Cognitive communication deficit: Secondary | ICD-10-CM | POA: Diagnosis not present

## 2017-05-18 DIAGNOSIS — L97923 Non-pressure chronic ulcer of unspecified part of left lower leg with necrosis of muscle: Secondary | ICD-10-CM | POA: Diagnosis not present

## 2017-05-23 DIAGNOSIS — I10 Essential (primary) hypertension: Secondary | ICD-10-CM | POA: Diagnosis not present

## 2017-05-23 DIAGNOSIS — R2689 Other abnormalities of gait and mobility: Secondary | ICD-10-CM | POA: Diagnosis not present

## 2017-05-23 DIAGNOSIS — M6281 Muscle weakness (generalized): Secondary | ICD-10-CM | POA: Diagnosis not present

## 2017-05-23 DIAGNOSIS — R41841 Cognitive communication deficit: Secondary | ICD-10-CM | POA: Diagnosis not present

## 2017-05-23 DIAGNOSIS — L97923 Non-pressure chronic ulcer of unspecified part of left lower leg with necrosis of muscle: Secondary | ICD-10-CM | POA: Diagnosis not present

## 2017-05-23 DIAGNOSIS — D638 Anemia in other chronic diseases classified elsewhere: Secondary | ICD-10-CM | POA: Diagnosis not present

## 2017-05-24 DIAGNOSIS — R2689 Other abnormalities of gait and mobility: Secondary | ICD-10-CM | POA: Diagnosis not present

## 2017-05-24 DIAGNOSIS — R41841 Cognitive communication deficit: Secondary | ICD-10-CM | POA: Diagnosis not present

## 2017-05-24 DIAGNOSIS — M6281 Muscle weakness (generalized): Secondary | ICD-10-CM | POA: Diagnosis not present

## 2017-05-24 DIAGNOSIS — L97923 Non-pressure chronic ulcer of unspecified part of left lower leg with necrosis of muscle: Secondary | ICD-10-CM | POA: Diagnosis not present

## 2017-05-24 DIAGNOSIS — I10 Essential (primary) hypertension: Secondary | ICD-10-CM | POA: Diagnosis not present

## 2017-05-24 DIAGNOSIS — D638 Anemia in other chronic diseases classified elsewhere: Secondary | ICD-10-CM | POA: Diagnosis not present

## 2017-05-25 DIAGNOSIS — I878 Other specified disorders of veins: Secondary | ICD-10-CM | POA: Diagnosis not present

## 2017-05-25 DIAGNOSIS — I10 Essential (primary) hypertension: Secondary | ICD-10-CM | POA: Diagnosis not present

## 2017-05-25 DIAGNOSIS — R600 Localized edema: Secondary | ICD-10-CM | POA: Diagnosis not present

## 2017-05-25 DIAGNOSIS — I83028 Varicose veins of left lower extremity with ulcer other part of lower leg: Secondary | ICD-10-CM | POA: Diagnosis not present

## 2017-05-25 DIAGNOSIS — L97829 Non-pressure chronic ulcer of other part of left lower leg with unspecified severity: Secondary | ICD-10-CM | POA: Diagnosis not present

## 2017-05-29 DIAGNOSIS — M6281 Muscle weakness (generalized): Secondary | ICD-10-CM | POA: Diagnosis not present

## 2017-05-29 DIAGNOSIS — L97923 Non-pressure chronic ulcer of unspecified part of left lower leg with necrosis of muscle: Secondary | ICD-10-CM | POA: Diagnosis not present

## 2017-05-29 DIAGNOSIS — I10 Essential (primary) hypertension: Secondary | ICD-10-CM | POA: Diagnosis not present

## 2017-05-29 DIAGNOSIS — R2689 Other abnormalities of gait and mobility: Secondary | ICD-10-CM | POA: Diagnosis not present

## 2017-05-29 DIAGNOSIS — D638 Anemia in other chronic diseases classified elsewhere: Secondary | ICD-10-CM | POA: Diagnosis not present

## 2017-05-29 DIAGNOSIS — R41841 Cognitive communication deficit: Secondary | ICD-10-CM | POA: Diagnosis not present

## 2017-05-30 DIAGNOSIS — D638 Anemia in other chronic diseases classified elsewhere: Secondary | ICD-10-CM | POA: Diagnosis not present

## 2017-05-30 DIAGNOSIS — M6281 Muscle weakness (generalized): Secondary | ICD-10-CM | POA: Diagnosis not present

## 2017-05-30 DIAGNOSIS — R2689 Other abnormalities of gait and mobility: Secondary | ICD-10-CM | POA: Diagnosis not present

## 2017-05-30 DIAGNOSIS — I10 Essential (primary) hypertension: Secondary | ICD-10-CM | POA: Diagnosis not present

## 2017-05-30 DIAGNOSIS — L97923 Non-pressure chronic ulcer of unspecified part of left lower leg with necrosis of muscle: Secondary | ICD-10-CM | POA: Diagnosis not present

## 2017-05-30 DIAGNOSIS — R41841 Cognitive communication deficit: Secondary | ICD-10-CM | POA: Diagnosis not present

## 2017-06-01 ENCOUNTER — Encounter: Payer: Medicare HMO | Attending: Nurse Practitioner | Admitting: Nurse Practitioner

## 2017-06-01 ENCOUNTER — Other Ambulatory Visit: Payer: Self-pay | Admitting: Nurse Practitioner

## 2017-06-01 ENCOUNTER — Ambulatory Visit
Admission: RE | Admit: 2017-06-01 | Discharge: 2017-06-01 | Disposition: A | Discharge status: Home / Self Care | Payer: Medicare HMO | Source: Ambulatory Visit | Attending: Nurse Practitioner | Admitting: Nurse Practitioner

## 2017-06-01 DIAGNOSIS — M1990 Unspecified osteoarthritis, unspecified site: Secondary | ICD-10-CM | POA: Diagnosis not present

## 2017-06-01 DIAGNOSIS — I890 Lymphedema, not elsewhere classified: Secondary | ICD-10-CM | POA: Diagnosis not present

## 2017-06-01 DIAGNOSIS — L97822 Non-pressure chronic ulcer of other part of left lower leg with fat layer exposed: Secondary | ICD-10-CM | POA: Diagnosis not present

## 2017-06-01 DIAGNOSIS — L97221 Non-pressure chronic ulcer of left calf limited to breakdown of skin: Secondary | ICD-10-CM | POA: Insufficient documentation

## 2017-06-01 DIAGNOSIS — I739 Peripheral vascular disease, unspecified: Secondary | ICD-10-CM | POA: Diagnosis not present

## 2017-06-01 DIAGNOSIS — R609 Edema, unspecified: Secondary | ICD-10-CM | POA: Diagnosis not present

## 2017-06-01 DIAGNOSIS — I872 Venous insufficiency (chronic) (peripheral): Secondary | ICD-10-CM | POA: Diagnosis not present

## 2017-06-01 DIAGNOSIS — I10 Essential (primary) hypertension: Secondary | ICD-10-CM | POA: Diagnosis not present

## 2017-06-01 DIAGNOSIS — R600 Localized edema: Secondary | ICD-10-CM | POA: Diagnosis not present

## 2017-06-01 DIAGNOSIS — L539 Erythematous condition, unspecified: Secondary | ICD-10-CM

## 2017-06-02 DIAGNOSIS — I10 Essential (primary) hypertension: Secondary | ICD-10-CM | POA: Diagnosis not present

## 2017-06-02 DIAGNOSIS — M6281 Muscle weakness (generalized): Secondary | ICD-10-CM | POA: Diagnosis not present

## 2017-06-02 DIAGNOSIS — R41841 Cognitive communication deficit: Secondary | ICD-10-CM | POA: Diagnosis not present

## 2017-06-02 DIAGNOSIS — R2689 Other abnormalities of gait and mobility: Secondary | ICD-10-CM | POA: Diagnosis not present

## 2017-06-02 DIAGNOSIS — L97923 Non-pressure chronic ulcer of unspecified part of left lower leg with necrosis of muscle: Secondary | ICD-10-CM | POA: Diagnosis not present

## 2017-06-02 DIAGNOSIS — D638 Anemia in other chronic diseases classified elsewhere: Secondary | ICD-10-CM | POA: Diagnosis not present

## 2017-06-03 NOTE — Progress Notes (Signed)
[name] [name], [name] [name] ([id number]) Visit Report for 06/01/2017 Abuse/Suicide Risk Screen Details Patient Name: [name] [name] Date of Service: 06/01/2017 9:45 AM Medical Record Number: [id number] Patient Account Number: [id number] Date of Birth/Sex: 12/04/1926 (84 y.o. Female) Treating RN: [name] [name] Primary Care [name] [name]: [name] [name] Other Clinician: Referring [name] [name]: [name] [name] Treating Lakeesha Fontanilla/Extender: [name] [name] in Treatment: 0 Abuse/Suicide Risk Screen Items Answer ABUSE/SUICIDE RISK SCREEN: Has anyone close to you tried to hurt or harm you recentlyo No Do you feel uncomfortable with anyone in your familyo No Has anyone forced you do things that you didnot want to doo No Do you have any thoughts of harming yourselfo No Patient displays signs or symptoms of abuse and/or neglect. No Electronic Signature(s) Signed: 06/01/2017 5:31:29 PM By: [name] [name] Entered By: [name] [name] on 06/01/2017 10:20:29 [name] [name] ([id number]) -------------------------------------------------------------------------------- Activities of Daily Living Details Patient Name: [name] [name] Date of Service: 06/01/2017 9:45 AM Medical Record Number: [id number] Patient Account Number: [id number] Date of Birth/Sex: 1926/08/02 (84 y.o. Female) Treating RN: [name] [name] Primary Care Kevionna Heffler: [name] [name] Other Clinician: Referring [name] [name]: [name] [name] Treating [name] [name]/Extender: [name] [name] in Treatment: 0 Activities of Daily Living Items Answer Activities of Daily Living (Please select one for each item) Drive Automobile Not Able Take Medications Need Assistance Use Telephone Completely Able Care for Appearance Need Assistance Use Toilet Need Assistance Bath / Shower Need Assistance Dress Self Need Assistance Feed Self Completely Able Walk Need Assistance Get In / Out Bed Need Assistance Housework Need Assistance Prepare Meals Need Assistance Handle Money  Completely Able Shop for Self Need Assistance Electronic Signature(s) Signed: 06/01/2017 5:31:29 PM By: [name] [name] Entered By: [name] [name] on 06/01/2017 10:21:00 [name] [name] ([id number]) -------------------------------------------------------------------------------- Education Assessment Details Patient Name: [name] [name] Date of Service: 06/01/2017 9:45 AM Medical Record Number: [id number] Patient Account Number: [id number] Date of Birth/Sex: 1927/01/04 (84 y.o. Female) Treating RN: [name] [name] Primary Care Lauria Depoy: [name] [name] Other Clinician: Referring [name] [name]: [name] [name] Treating [name] [name]/Extender: [name] [name] in Treatment: 0 Primary Learner Assessed: Patient Learning Preferences/Education Level/Primary Language Learning Preference: Explanation, Demonstration Highest Education Level: High School Preferred Language: English Cognitive Barrier Assessment/Beliefs Language Barrier: No Translator Needed: No Memory Deficit: No Emotional Barrier: No Cultural/Religious Beliefs Affecting Medical Care: No Physical Barrier Assessment Impaired Vision: No Impaired Hearing: No Decreased Hand dexterity: No Knowledge/Comprehension Assessment Knowledge Level: Medium Comprehension Level: Medium Ability to understand written Medium instructions: Ability to understand verbal Medium instructions: Motivation Assessment Anxiety Level: Calm Cooperation: Cooperative Education Importance: Acknowledges Need Interest in Health Problems: Asks Questions Perception: Coherent Willingness to Engage in Self- Medium Management Activities: Readiness to Engage in Self- Medium Management Activities: Electronic Signature(s) Signed: 06/01/2017 5:31:29 PM By: [name] [name] Entered By: [name] [name] on 06/01/2017 10:21:24 [name] [name] ([id number]) -------------------------------------------------------------------------------- Fall Risk Assessment Details Patient  Name: [name] [name] Date of Service: 06/01/2017 9:45 AM Medical Record Number: [id number] Patient Account Number: [id number] Date of Birth/Sex: September 27, 1926 (84 y.o. Female) Treating RN: [name] [name] Primary Care [name] [name]: [name] [name] Other Clinician: Referring [name] [name]: [name] [name] Treating Ashanna Heinsohn/Extender: [name] [name] in Treatment: 0 Fall Risk Assessment Items Have you had 2 or more falls in the last 12 monthso 0 No Have you had any fall that resulted in injury in the last 12 monthso 0 No FALL RISK ASSESSMENT: History of falling - immediate or within 3 months 0 No Secondary diagnosis 0 No Ambulatory aid None/bed rest/wheelchair/nurse 0 No Crutches/cane/walker 15 Yes Furniture 0 No IV Access/Saline Lock 0 No Gait/Training Normal/bed rest/immobile 0 No Weak  10 Yes Impaired 20 Yes Mental Status Oriented to own ability 0 Yes Electronic Signature(s) Signed: 06/01/2017 5:31:29 PM By: [name] [name] Entered By: [name] [name] on 06/01/2017 10:21:52 [name] [name] ([id number]) -------------------------------------------------------------------------------- Foot Assessment Details Patient Name: [name] [name] Date of Service: 06/01/2017 9:45 AM Medical Record Number: [id number] Patient Account Number: [id number] Date of Birth/Sex: 04-19-1926 (84 y.o. Female) Treating RN: [name] [name] Primary Care [name] [name]: [name] [name] Other Clinician: Referring [name] [name]: [name] [name] Treating [name] [name]/Extender: [name] [name] in Treatment: 0 Foot Assessment Items Site Locations + = Sensation present, - = Sensation absent, C = Callus, U = Ulcer R = Redness, W = Warmth, M = Maceration, PU = Pre-ulcerative lesion F = Fissure, S = Swelling, D = Dryness Assessment Right: Left: Other Deformity: No No Prior Foot Ulcer: No No Prior Amputation: No No Charcot Joint: No No Ambulatory Status: Ambulatory With Help Assistance Device: Walker Gait: Charity fundraiser) Signed: 06/01/2017 5:31:29 PM By: [name] [name] Entered By: [name] [name] on 06/01/2017 10:22:21 [name] [name] ([id number]) -------------------------------------------------------------------------------- Nutrition Risk Assessment Details Patient Name: [name] [name] Date of Service: 06/01/2017 9:45 AM Medical Record Number: [id number] Patient Account Number: [id number] Date of Birth/Sex: 01-26-1926 (84 y.o. Female) Treating RN: [name] [name] Primary Care Jun Osment: [name] [name] Other Clinician: Referring Emmaline Wahba: [name] [name] Treating Bertis Hustead/Extender: [name] [name] in Treatment: 0 Height (in): 69 Weight (lbs): 206 Body Mass Index (BMI): 30.4 Nutrition Risk Assessment Items NUTRITION RISK SCREEN: I have an illness or condition that made me change the kind and/or amount of 0 No food I eat I eat fewer than two meals per day 0 No I eat few fruits and vegetables, or milk products 0 No I have three or more drinks of beer, liquor or wine almost every day 0 No I have tooth or mouth problems that make it hard for me to eat 0 No I don't always have enough money to buy the food I need 0 No I eat alone most of the time 0 No I take three or more different prescribed or over-the-counter drugs a day 1 Yes Without wanting to, I have lost or gained 10 pounds in the last six months 0 No I am not always physically able to shop, cook and/or feed myself 0 No Nutrition Protocols Good Risk Protocol 0 No interventions needed Moderate Risk Protocol Electronic Signature(s) Signed: 06/01/2017 5:31:29 PM By: [name] [name] Entered By: [name] [name] on 06/01/2017 10:21:59

## 2017-06-03 NOTE — Progress Notes (Signed)
SLOKA, VOLANTE ([id number]) Visit Report for 06/01/2017 Allergy List Details Patient Name: [name] [name], FLATEN Date of Service: 06/01/2017 9:45 AM Medical Record Number: [id number] Patient Account Number: [id number] Date of Birth/Sex: 25-Jun-1926 (84 y.o. Female) Treating RN: Curtis Sites Primary Care Meline Russaw: Leotis Shames Other Clinician: Referring Shelbee Apgar: Ignacia Bayley Treating Keneisha Heckart/Extender: Bonnell Public Weeks in Treatment: 0 Allergies Active Allergies No Known Allergies Allergy Notes Electronic Signature(s) Signed: 06/01/2017 5:31:29 PM By: Curtis Sites Entered By: Curtis Sites on 06/01/2017 10:20:16 [name] [name] ([id number]) -------------------------------------------------------------------------------- Arrival Information Details Patient Name: [name] [name] Date of Service: 06/01/2017 9:45 AM Medical Record Number: [id number] Patient Account Number: [id number] Date of Birth/Sex: 12-Sep-1926 (84 y.o. Female) Treating RN: Curtis Sites Primary Care Ata Pecha: Leotis Shames Other Clinician: Referring Meliss Fleek: Ignacia Bayley Treating Rafaella Kole/Extender: Kathreen Cosier in Treatment: 0 Visit Information Patient Arrived: Wheel Chair Arrival Time: 10:07 Accompanied By: self Transfer Assistance: None Patient Identification Verified: Yes Secondary Verification Process Completed: Yes Electronic Signature(s) Signed: 06/01/2017 5:31:29 PM By: Curtis Sites Entered By: Curtis Sites on 06/01/2017 10:12:23 [name] [name] ([id number]) -------------------------------------------------------------------------------- Clinic Level of Care Assessment Details Patient Name: [name] [name] Date of Service: 06/01/2017 9:45 AM Medical Record Number: [id number] Patient Account Number: [id number] Date of Birth/Sex: [birth year] (84 y.o. Female) Treating RN: Renne Crigler Primary Care Blondie Riggsbee: Leotis Shames Other Clinician: Referring Kortlynn Poust: Ignacia Bayley Treating  Kodee Drury/Extender: Kathreen Cosier in Treatment: 0 Clinic Level of Care Assessment Items TOOL 2 Quantity Score X - Use when only an EandM is performed on the INITIAL visit 1 0 ASSESSMENTS - Nursing Assessment / Reassessment X - General Physical Exam (combine w/ comprehensive assessment (listed just below) when 1 20 performed on new pt. evals) X- 1 25 Comprehensive Assessment (HX, ROS, Risk Assessments, Wounds Hx, etc.) ASSESSMENTS - Wound and Skin Assessment / Reassessment X - Simple Wound Assessment / Reassessment - one wound 1 5  - 0 Complex Wound Assessment / Reassessment - multiple wounds  - 0 Dermatologic / Skin Assessment (not related to wound area) ASSESSMENTS - Ostomy and/or Continence Assessment and Care  - Incontinence Assessment and Management 0  - 0 Ostomy Care Assessment and Management (repouching, etc.) PROCESS - Coordination of Care X - Simple Patient / Family Education for ongoing care 1 15  - 0 Complex (extensive) Patient / Family Education for ongoing care  - 0 Staff obtains Chiropractor, Records, Test Results / Process Orders  - 0 Staff telephones HHA, Nursing Homes / Clarify orders / etc  - 0 Routine Transfer to another Facility (non-emergent condition)  - 0 Routine Hospital Admission (non-emergent condition)  - 0 New Admissions / Manufacturing engineer / Ordering NPWT, Apligraf, etc.  - 0 Emergency Hospital Admission (emergent condition) X- 1 10 Simple Discharge Coordination  - 0 Complex (extensive) Discharge Coordination PROCESS - Special Needs  - Pediatric / Minor Patient Management 0  - 0 Isolation Patient Management Wherry, Lakia ([id number])  - 0 Hearing / Language / Visual special needs  - 0 Assessment of Community assistance (transportation, D/C planning, etc.)  - 0 Additional assistance / Altered mentation  - 0 Support Surface(s) Assessment (bed, cushion, seat, etc.) INTERVENTIONS - Wound  Cleansing / Measurement X - Wound Imaging (photographs - any number of wounds) 1 5  - 0 Wound Tracing (instead of photographs) X- 1 5 Simple Wound Measurement - one wound  - 0 Complex Wound Measurement - multiple wounds X- 1 5 Simple Wound Cleansing - one wound  - 0 Complex Wound Cleansing - multiple wounds INTERVENTIONS - Wound Dressings  X - Small Wound Dressing one or multiple wounds 1 10  - 0 Medium Wound Dressing one or multiple wounds  - 0 Large Wound Dressing one or multiple wounds  - 0 Application of Medications - injection INTERVENTIONS - Miscellaneous  - External ear exam 0  - 0 Specimen Collection (cultures, biopsies, blood, body fluids, etc.)  - 0 Specimen(s) / Culture(s) sent or taken to Lab for analysis  - 0 Patient Transfer (multiple staff / Nurse, adult / Similar devices)  - 0 Simple Staple / Suture removal (25 or less)  - 0 Complex Staple / Suture removal (26 or more)  - 0 Hypo / Hyperglycemic Management (close monitor of Blood Glucose) X- 1 15 Ankle / Brachial Index (ABI) - do not check if billed separately Has the patient been seen at the hospital within the last three years: Yes Total Score: 115 Level Of Care: New/Established - Level 3 Electronic Signature(s) Signed: 06/01/2017 4:33:19 PM By: Renne Crigler Entered By: Renne Crigler on 06/01/2017 11:18:39 Quijas, Henry ([id number]) -------------------------------------------------------------------------------- Encounter Discharge Information Details Patient Name: [name] [name] Date of Service: 06/01/2017 9:45 AM Medical Record Number: [id number] Patient Account Number: [id number] Date of Birth/Sex: 1926-11-12 (84 y.o. Female) Treating RN: Huel Coventry Primary Care Helmut Hennon: Leotis Shames Other Clinician: Referring Jahrel Borthwick: Ignacia Bayley Treating Harpreet Signore/Extender: Kathreen Cosier in Treatment: 0 Encounter Discharge Information Items Discharge Condition:  Stable Ambulatory Status: Ambulatory Discharge Destination: Home Health Orders Sent: Yes Transportation: Private Auto Accompanied By: grandson Schedule Follow-up Appointment: Yes Clinical Summary of Care: Electronic Signature(s) Signed: 06/01/2017 4:41:17 PM By: Elliot Gurney, BSN, RN, CWS, Kim RN, BSN Entered By: Elliot Gurney, BSN, RN, CWS, Kim on 06/01/2017 16:41:17 [name] [name] ([id number]) -------------------------------------------------------------------------------- Lower Extremity Assessment Details Patient Name: [name] [name] Date of Service: 06/01/2017 9:45 AM Medical Record Number: [id number] Patient Account Number: [id number] Date of Birth/Sex: September 14, 1926 (84 y.o. Female) Treating RN: Curtis Sites Primary Care Dmarius Reeder: Leotis Shames Other Clinician: Referring Cinch Ormond: Ignacia Bayley Treating Donie Lemelin/Extender: Kathreen Cosier in Treatment: 0 Edema Assessment Assessed: [Left: No] [Right: No] Edema: [Left: Yes] [Right: Yes] Calf Left: Right: Point of Measurement: 34 cm From Medial Instep 50.8 cm 54.1 cm Ankle Left: Right: Point of Measurement: 12 cm From Medial Instep 33 cm 31.1 cm Vascular Assessment Pulses: Dorsalis Pedis Palpable: [Left:Yes] [Right:Yes] Doppler Audible: [Left:Yes] [Right:Yes] Posterior Tibial Palpable: [Left:No] [Right:No] Doppler Audible: [Left:Yes] [Right:Yes] Extremity colors, hair growth, and conditions: Extremity Color: [Left:Hyperpigmented] [Right:Hyperpigmented] Hair Growth on Extremity: [Left:No] [Right:No] Temperature of Extremity: [Left:Cool] [Right:Cool] Capillary Refill: [Left:< 3 seconds] [Right:< 3 seconds] Blood Pressure: Brachial: [Left:138] [Right:140] Dorsalis Pedis: 168 [Left:Dorsalis Pedis: 152] Ankle: Posterior Tibial: 160 [Left:Posterior Tibial: 148 1.20] [Right:1.09] Toe Nail Assessment Left: Right: Thick: Yes Yes Discolored: Yes Yes Deformed: Yes Yes Improper Length and Hygiene: Yes Yes Electronic  Signature(s) Signed: 06/01/2017 5:31:29 PM By: Curtis Sites Entered By: Curtis Sites on 06/01/2017 10:41:39 Klinck, Shawnna ([id number]) Cathlean Cower, Azaylia ([id number]) -------------------------------------------------------------------------------- Multi Wound Chart Details Patient Name: [name] [name] Date of Service: 06/01/2017 9:45 AM Medical Record Number: [id number] Patient Account Number: [id number] Date of Birth/Sex: April 13, 1926 (84 y.o. Female) Treating RN: Renne Crigler Primary Care Anaika Santillano: Leotis Shames Other Clinician: Referring Dacie Mandel: Ignacia Bayley Treating Lucita Montoya/Extender: Kathreen Cosier in Treatment: 0 Vital Signs Height(in): 69 Pulse(bpm): 70 Weight(lbs): 206 Blood Pressure(mmHg): 144/53 Body Mass Index(BMI): 30 Temperature(F): 97.8 Respiratory Rate 16 (breaths/min): Photos: [1:No Photos] [N/A:N/A] Wound Location: [1:Left Lower Leg - Anterior] [N/A:N/A] Wounding Event: [1:Gradually Appeared] [N/A:N/A] Primary Etiology: [1:Venous Leg Ulcer] [N/A:N/A] Comorbid History: [1:Cataracts, Lymphedema, Hypertension, Peripheral  Venous Disease, Osteoarthritis] [N/A:N/A] Date Acquired: [1:12/26/2016] [N/A:N/A] Weeks of Treatment: [1:0] [N/A:N/A] Wound Status: [1:Open] [N/A:N/A] Clustered Wound: [1:Yes] [N/A:N/A] Clustered Quantity: [1:3] [N/A:N/A] Measurements L x W x D [1:7x0.8x0.1] [N/A:N/A] (cm) Area (cm) : [1:4.398] [N/A:N/A] Volume (cm) : [1:0.44] [N/A:N/A] % Reduction in Area: [1:0.00%] [N/A:N/A] % Reduction in Volume: [1:0.00%] [N/A:N/A] Classification: [1:Partial Thickness] [N/A:N/A] Exudate Amount: [1:Large] [N/A:N/A] Exudate Type: [1:Serous] [N/A:N/A] Exudate Color: [1:amber] [N/A:N/A] Wound Margin: [1:Flat and Intact] [N/A:N/A] Granulation Amount: [1:Large (67-100%)] [N/A:N/A] Granulation Quality: [1:Red] [N/A:N/A] Necrotic Amount: [1:Small (1-33%)] [N/A:N/A] Exposed Structures: [1:Fascia: No Fat Layer (Subcutaneous Tissue) Exposed:  No Tendon: No Muscle: No Joint: No Bone: No] [N/A:N/A] Epithelialization: [1:Large (67-100%)] [N/A:N/A] Periwound Skin Texture: [1:Excoriation: No Induration: No] [N/A:N/A] Callus: No Crepitus: No Rash: No Scarring: No Periwound Skin Moisture: Maceration: No N/A N/A Dry/Scaly: No Periwound Skin Color: Erythema: Yes N/A N/A Atrophie Blanche: No Cyanosis: No Ecchymosis: No Hemosiderin Staining: No Mottled: No Pallor: No Rubor: No Erythema Location: Circumferential N/A N/A Temperature: No Abnormality N/A N/A Tenderness on Palpation: No N/A N/A Wound Preparation: Ulcer Cleansing: Other: soap N/A N/A and water Topical Anesthetic Applied: None Treatment Notes Electronic Signature(s) Signed: 06/01/2017 12:07:56 PM By: Bonnell Public Entered By: Bonnell Public on 06/01/2017 12:07:56 [name] [name] ([id number]) -------------------------------------------------------------------------------- Multi-Disciplinary Care Plan Details Patient Name: [name] [name] Date of Service: 06/01/2017 9:45 AM Medical Record Number: [id number] Patient Account Number: [id number] Date of Birth/Sex: [birth year] (84 y.o. Female) Treating RN: Renne Crigler Primary Care Lizvet Chunn: Leotis Shames Other Clinician: Referring Charle Clear: Ignacia Bayley Treating Rosalind Guido/Extender: Kathreen Cosier in Treatment: 0 Active Inactive ` Orientation to the Wound Care Program Nursing Diagnoses: Knowledge deficit related to the wound healing center program Goals: Patient/caregiver will verbalize understanding of the Wound Healing Center Program Date Initiated: 06/01/2017 Target Resolution Date: 06/22/2017 Goal Status: Active Interventions: Provide education on orientation to the wound center Notes: ` Wound/Skin Impairment Nursing Diagnoses: Impaired tissue integrity Goals: Patient/caregiver will verbalize understanding of skin care regimen Date Initiated: 06/01/2017 Target Resolution Date: 06/22/2017 Goal  Status: Active Ulcer/skin breakdown will have a volume reduction of 30% by week 4 Date Initiated: 06/01/2017 Target Resolution Date: 06/22/2017 Goal Status: Active Interventions: Assess patient/caregiver ability to obtain necessary supplies Assess patient/caregiver ability to perform ulcer/skin care regimen upon admission and as needed Assess ulceration(s) every visit Treatment Activities: Skin care regimen initiated : 06/01/2017 Notes: Electronic Signature(s) Signed: 06/01/2017 4:33:19 PM By: Rubbie Battiest, Lajada ([id number]) Entered By: Renne Crigler on 06/01/2017 10:57:12 [name] [name] ([id number]) -------------------------------------------------------------------------------- Pain Assessment Details Patient Name: [name] [name] Date of Service: 06/01/2017 9:45 AM Medical Record Number: [id number] Patient Account Number: [id number] Date of Birth/Sex: 02/12/1926 (84 y.o. Female) Treating RN: Curtis Sites Primary Care Nahsir Venezia: Leotis Shames Other Clinician: Referring Alura Olveda: Ignacia Bayley Treating Junia Nygren/Extender: Kathreen Cosier in Treatment: 0 Active Problems Location of Pain Severity and Description of Pain Patient Has Paino No Site Locations Pain Management and Medication Current Pain Management: Electronic Signature(s) Signed: 06/01/2017 5:31:29 PM By: Curtis Sites Entered By: Curtis Sites on 06/01/2017 10:14:14 [name] [name] ([id number]) -------------------------------------------------------------------------------- Patient/Caregiver Education Details Patient Name: [name] [name] Date of Service: 06/01/2017 9:45 AM Medical Record Number: [id number] Patient Account Number: [id number] Date of Birth/Gender: 1926/01/13 (84 y.o. Female) Treating RN: Huel Coventry Primary Care Physician: Leotis Shames Other Clinician: Referring Physician: Ignacia Bayley Treating Physician/Extender: Kathreen Cosier in Treatment: 0 Education  Assessment Education Provided To: Patient Education Topics Provided Wound/Skin Impairment: Handouts: Caring for Your Ulcer Methods: Demonstration, Explain/Verbal Responses: State content correctly Electronic Signature(s) Signed: 06/01/2017 5:02:07  PM By: Elliot Gurney, BSN, RN, CWS, Kim RN, BSN Entered By: Elliot Gurney, BSN, RN, CWS, Kim on 06/01/2017 16:41:39 [name] [name] ([id number]) -------------------------------------------------------------------------------- Wound Assessment Details Patient Name: [name] [name] Date of Service: 06/01/2017 9:45 AM Medical Record Number: [id number] Patient Account Number: [id number] Date of Birth/Sex: 1926/11/28 (84 y.o. Female) Treating RN: Curtis Sites Primary Care Capria Cartaya: Leotis Shames Other Clinician: Referring Rithika Seel: Ignacia Bayley Treating Albert Devaul/Extender: Kathreen Cosier in Treatment: 0 Wound Status Wound Number: 1 Primary Venous Leg Ulcer Etiology: Wound Location: Left Lower Leg - Anterior Wound Open Wounding Event: Gradually Appeared Status: Date Acquired: 12/26/2016 Comorbid Cataracts, Lymphedema, Hypertension, Weeks Of Treatment: 0 History: Peripheral Venous Disease, Osteoarthritis Clustered Wound: Yes Photos Photo Uploaded By: Curtis Sites on 06/01/2017 16:52:41 Wound Measurements Length: (cm) 7 % Redu Width: (cm) 0.8 % Redu Depth: (cm) 0.1 Epithe Clustered Quantity: 3 Tunnel Area: (cm) 4.398 Under Volume: (cm) 0.44 ction in Area: 0% ction in Volume: 0% lialization: Large (67-100%) ing: No mining: No Wound Description Classification: Partial Thickness Wound Margin: Flat and Intact Exudate Amount: Large Exudate Type: Serous Exudate Color: amber Foul Odor After Cleansing: No Slough/Fibrino Yes Wound Bed Granulation Amount: Large (67-100%) Exposed Structure Granulation Quality: Red Fascia Exposed: No Necrotic Amount: Small (1-33%) Fat Layer (Subcutaneous Tissue) Exposed: No Necrotic Quality: Adherent  Slough Tendon Exposed: No Muscle Exposed: No Joint Exposed: No Bone Exposed: No Periwound Skin Texture Onofrio, Annmargaret ([id number]) Texture Color No Abnormalities Noted: No No Abnormalities Noted: No Callus: No Atrophie Blanche: No Crepitus: No Cyanosis: No Excoriation: No Ecchymosis: No Induration: No Erythema: Yes Rash: No Erythema Location: Circumferential Scarring: No Hemosiderin Staining: No Mottled: No Moisture Pallor: No No Abnormalities Noted: No Rubor: No Dry / Scaly: No Maceration: No Temperature / Pain Temperature: No Abnormality Wound Preparation Ulcer Cleansing: Other: soap and water, Topical Anesthetic Applied: None Treatment Notes Wound #1 (Left, Anterior Lower Leg) 1. Cleansed with: Clean wound with Normal Saline 2. Anesthetic Topical Lidocaine 4% cream to wound bed prior to debridement 4. Dressing Applied: Non-Adherent gauze 5. Secondary Dressing Applied Kerlix/Conform 7. Secured with Secretary/administrator) Signed: 06/01/2017 5:31:29 PM By: Curtis Sites Entered By: Curtis Sites on 06/01/2017 10:32:17 [name] [name] ([id number]) -------------------------------------------------------------------------------- Vitals Details Patient Name: [name] [name] Date of Service: 06/01/2017 9:45 AM Medical Record Number: [id number] Patient Account Number: [id number] Date of Birth/Sex: Jun 11, 1926 (84 y.o. Female) Treating RN: Curtis Sites Primary Care Sayyid Harewood: Leotis Shames Other Clinician: Referring Cosby Proby: Ignacia Bayley Treating Tom Macpherson/Extender: Kathreen Cosier in Treatment: 0 Vital Signs Time Taken: 10:14 Temperature (F): 97.8 Height (in): 69 Pulse (bpm): 70 Source: Measured Respiratory Rate (breaths/min): 16 Weight (lbs): 206 Blood Pressure (mmHg): 144/53 Source: Measured Reference Range: 80 - 120 mg / dl Body Mass Index (BMI): 30.4 Electronic Signature(s) Signed: 06/01/2017 5:31:29 PM By: Curtis Sites Entered  By: Curtis Sites on 06/01/2017 10:16:31

## 2017-06-05 NOTE — Progress Notes (Signed)
MICHAILA, KENNEY ([id number]) Visit Report for 06/01/2017 Chief Complaint Document Details Patient Name: CELISE, BAZAR Date of Service: 06/01/2017 9:45 AM Medical Record Number: [id number] Patient Account Number: [id number] Date of Birth/Sex: 12-16-1926 (84 y.o. Female) Treating RN: Renne Crigler Primary Care Provider: Leotis Shames Other Clinician: Referring Provider: Ignacia Bayley Treating Provider/Extender: Kathreen Cosier in Treatment: 0 Information Obtained from: Patient Chief Complaint LLE venous/lymphedema ulcer Electronic Signature(s) Signed: 06/01/2017 12:08:18 PM By: Bonnell Public Entered By: Bonnell Public on 06/01/2017 12:08:18 [name] ([id number]) -------------------------------------------------------------------------------- HPI Details Patient Name: [name] Date of Service: 06/01/2017 9:45 AM Medical Record Number: [id number] Patient Account Number: [id number] Date of Birth/Sex: 04-13-1926 (84 y.o. Female) Treating RN: Renne Crigler Primary Care Provider: Leotis Shames Other Clinician: Referring Provider: Ignacia Bayley Treating Provider/Extender: Kathreen Cosier in Treatment: 0 History of Present Illness HPI Description: 06/01/17-She is here for initial evaluation of left lower extremity ulcers. She states these have been present for approximately 6-7 months; she is unclear exactly who she has followed with, states [facility], people in [place]. She reports a history of lymphedema pumps with extremely sporadic use over the last 2 years, none recently. She has been treated with Unna boots in the left lower extremity, no compression to the right lower extremity. She states her lower extremity edema is at/near baseline, I have low suspicion for DVT prior to aggressively compressing and initiating lymphdema pumps twice daily; she denies IVC filter. I see no recent ultrasound in Epic; last DVT study was to the left in February. She  denies pain, low suspicion of cellulitis. Electronic Signature(s) Signed: 06/01/2017 12:12:43 PM By: Bonnell Public Entered By: Bonnell Public on 06/01/2017 12:12:43 [name] ([id number]) -------------------------------------------------------------------------------- Physical Exam Details Patient Name: [name] Date of Service: 06/01/2017 9:45 AM Medical Record Number: [id number] Patient Account Number: [id number] Date of Birth/Sex: May 05, 1926 (84 y.o. Female) Treating RN: Renne Crigler Primary Care Provider: Leotis Shames Other Clinician: Referring Provider: Ignacia Bayley Treating Provider/Extender: Bonnell Public Weeks in Treatment: 0 Respiratory respirations are even and unlabored. clear throughout. Cardiovascular s1 s2 regular rate and rhythm. bilateral ABI > 1. BLE lymphedema, hyperpigmentation. Musculoskeletal arrived in wheelchair. Psychiatric appears to have poor insight and/or judgement; very poor historian. oriented x4. Electronic Signature(s) Signed: 06/01/2017 12:14:00 PM By: Bonnell Public Entered By: Bonnell Public on 06/01/2017 12:13:59 [name] ([id number]) -------------------------------------------------------------------------------- Physician Orders Details Patient Name: [name] Date of Service: 06/01/2017 9:45 AM Medical Record Number: [id number] Patient Account Number: [id number] Date of Birth/Sex: 09/24/1926 (84 y.o. Female) Treating RN: Renne Crigler Primary Care Provider: Leotis Shames Other Clinician: Referring Provider: Ignacia Bayley Treating Provider/Extender: Kathreen Cosier in Treatment: 0 Verbal / Phone Orders: No Diagnosis Coding Wound Cleansing Wound #1 Left,Anterior Lower Leg o Clean wound with Normal Saline. Primary Wound Dressing Wound #1 Left,Anterior Lower Leg o Mepitel One Contact layer Secondary Dressing Wound #1 Left,Anterior Lower Leg o Non-adherent pad - over red areas on lower  leg Dressing Change Frequency o Other: - Twice weekly, however HHRN needs to change on Friday 5/24 and on Monday 5/27, patient will return to clinic on Thursday 5/30. Follow-up Appointments Wound #1 Left,Anterior Lower Leg o Return Appointment in 1 week. Edema Control Wound #1 Left,Anterior Lower Leg o 3 Layer Compression System - Bilateral o Compression Pump: Use compression pump on left lower extremity for 30 minutes, twice daily. - use for 45 minutes BID o Compression Pump: Use compression pump on right lower extremity for 30 minutes, twice daily. - use 45 minutes BID Home Health  Wound #1 Left,Anterior Lower Leg o Initiate Home Health for Skilled Nursing o Continue Home Health Visits o Home Health Nurse may visit PRN to address patientos wound care needs. o FACE TO FACE ENCOUNTER: MEDICARE and MEDICAID PATIENTS: I certify that this patient is under my care and that I had a face-to-face encounter that meets the physician face-to-face encounter requirements with this patient on this date. The encounter with the patient was in whole or in part for the following MEDICAL CONDITION: (primary reason for Home Healthcare) MEDICAL NECESSITY: I certify, that based on my findings, NURSING services are a medically necessary home health service. HOME BOUND STATUS: I certify that my clinical findings support that this patient is homebound (i.e., Due to illness or injury, pt requires aid of supportive devices such as crutches, cane, wheelchairs, walkers, the use of special transportation or the assistance of another person to leave their place of residence. There is a normal inability to leave the home and doing so requires considerable and taxing effort. Other absences are for medical reasons / religious services and are infrequent or of short duration when for other reasons). MARSHAL, SCHRECENGOST ([id number]) o If current dressing causes regression in wound condition, may D/C  ordered dressing product/s and apply Normal Saline Moist Dressing daily until next Wound Healing Center / Other MD appointment. Notify Wound Healing Center of regression in wound condition at [phone]. o Please direct any NON-WOUND related issues/requests for orders to patient's Primary Care Physician Services and Therapies o Venous Studies -Bilateral - ultrasound bilateral to rule out DVT Electronic Signature(s) Signed: 06/01/2017 4:33:19 PM By: Renne Crigler Signed: 06/02/2017 8:17:02 AM By: Bonnell Public Previous Signature: 06/01/2017 12:15:14 PM Version By: Bonnell Public Entered By: Renne Crigler on 06/01/2017 15:13:09 Bellard, Emony ([id number]) -------------------------------------------------------------------------------- Problem List Details Patient Name: [name] Date of Service: 06/01/2017 9:45 AM Medical Record Number: [id number] Patient Account Number: [id number] Date of Birth/Sex: October 25, 1926 (84 y.o. 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 Female) Treating RN: Renne Crigler Primary Care Provider:  Leotis Shames Other Clinician: Referring Provider: Ignacia Bayley Treating Provider/Extender: Kathreen Cosier in Treatment: 0 Subjective Chief Complaint Information obtained from Patient LLE venous/lymphedema ulcer History of Present Illness (HPI) 06/01/17-She is here for initial evaluation of left lower extremity ulcers. She states these have been present for approximately 6-7 months; she is unclear exactly who she has followed with, states [facility], people in [place]. She reports a history of lymphedema pumps with extremely sporadic use over the last 2 years, none recently. She has been treated with Unna boots in the left lower extremity, no compression to the right lower extremity. She states her lower extremity edema is at/near baseline, I have low suspicion for DVT prior to aggressively compressing and initiating lymphdema pumps twice daily; she denies IVC filter. I see no recent ultrasound in Epic; last DVT study was to the left in February. She denies pain, low  suspicion of cellulitis. Wound History Patient presents with 1 open wound that has been present for approximately 5-6 months. Patient has been treating wound in the following manner: unna boot. Laboratory tests have not been performed in the last month. Patient reportedly has not tested positive for an antibiotic resistant organism. Patient reportedly has not tested positive for osteomyelitis. Patient reportedly has not had testing performed to evaluate circulation in the legs. Patient History Information obtained from Patient. Allergies No Known Allergies Family History Diabetes - Father, No family history of Cancer, Heart Disease, Hereditary Spherocytosis, Hypertension, Kidney Disease, Lung Disease, Seizures, Stroke, Thyroid Problems, Tuberculosis. Social History Never smoker, Marital Status - Widowed, Alcohol Use - Never, Drug Use - No History, Caffeine Use - Moderate. Medical History Eyes Patient has  history of Cataracts Denies history of Glaucoma, Optic Neuritis Ear/Nose/Mouth/Throat Denies history of Chronic sinus problems/congestion, Middle ear problems Hematologic/Lymphatic Patient has history of Lymphedema Denies history of Anemia, Hemophilia, Human Immunodeficiency Virus, Sickle Cell Disease Respiratory Denies history of Aspiration, Asthma, Chronic Obstructive Pulmonary Disease (COPD), Pneumothorax, Sleep Apnea, Baldinger, Avice ([id number]) Tuberculosis Cardiovascular Patient has history of Hypertension, Peripheral Venous Disease Denies history of Angina, Arrhythmia, Congestive Heart Failure, Coronary Artery Disease, Deep Vein Thrombosis, Hypotension, Myocardial Infarction, Peripheral Arterial Disease, Phlebitis, Vasculitis Gastrointestinal Denies history of Cirrhosis , Colitis, Crohn s, Hepatitis A, Hepatitis B, Hepatitis C Endocrine Denies history of Type I Diabetes, Type II Diabetes Genitourinary Denies history of End Stage Renal Disease Immunological Denies history of Lupus Erythematosus, Raynaud s, Scleroderma Integumentary (Skin) Denies history of History of Burn, History of pressure wounds Musculoskeletal Patient has history of Osteoarthritis Denies history of Gout, Rheumatoid Arthritis, Osteomyelitis Neurologic Denies history of Dementia, Neuropathy, Quadriplegia, Paraplegia, Seizure Disorder Oncologic Denies history of Received Chemotherapy, Received Radiation Psychiatric Denies history of Anorexia/bulimia, Confinement Anxiety Review of Systems (ROS) Constitutional Symptoms (General Health) Denies complaints or symptoms of Fatigue, Fever, Chills, Marked Weight Change. Eyes Denies complaints or symptoms of Dry Eyes, Vision Changes, Glasses / Contacts. Ear/Nose/Mouth/Throat Denies complaints or symptoms of Difficult clearing ears, Sinusitis. Hematologic/Lymphatic Denies complaints or symptoms of Bleeding / Clotting Disorders, Human Immunodeficiency  Virus. Respiratory Denies complaints or symptoms of Chronic or frequent coughs, Shortness of Breath. Cardiovascular Complains or has symptoms of LE edema. Denies complaints or symptoms of Chest pain. Gastrointestinal Denies complaints or symptoms of Frequent diarrhea, Nausea, Vomiting. Endocrine Denies complaints or symptoms of Hepatitis, Thyroid disease, Polydypsia (Excessive Thirst). Genitourinary Complains or has symptoms of Incontinence/dribbling. Denies complaints or symptoms of Kidney failure/ Dialysis. Immunological Denies complaints or symptoms of Hives, Itching. Integumentary (Skin) Complains or has symptoms of Wounds. Denies complaints or symptoms of Bleeding or bruising tendency, Breakdown, Swelling. Musculoskeletal Complains or has symptoms of Muscle Weakness. Denies complaints or symptoms of Muscle Pain. Neurologic Denies complaints or symptoms of Numbness/parasthesias, Focal/Weakness. Psychiatric Denies complaints or symptoms of Anxiety, Claustrophobia. RUSSETT, Allan ([id number]) Objective Constitutional Vitals Time Taken: 10:14 AM, Height: 69 in, Source: Measured, Weight: 206 lbs, Source: Measured, BMI: 30.4, Temperature: 97.8 F, Pulse: 70 bpm, Respiratory Rate: 16 breaths/min, Blood Pressure: 144/53 mmHg. Respiratory respirations are even and unlabored. clear throughout. Cardiovascular s1 s2 regular rate and rhythm. bilateral ABI > 1. BLE lymphedema, hyperpigmentation. Musculoskeletal arrived in wheelchair. Psychiatric appears to have poor insight and/or judgement; very poor historian. oriented x4. Integumentary (Hair, Skin) Wound #1 status is Open. Original cause of wound was Gradually Appeared. The wound is located on the Left,Anterior Lower Leg. The wound measures 7cm length x 0.8cm width x 0.1cm depth; 4.398cm^2  area and 0.44cm^3 volume. There is no tunneling or undermining noted. There is a large amount of serous drainage noted. The wound margin is  flat and intact. There is large (67-100%) red granulation within the wound bed. There is a small (1-33%) amount of necrotic tissue within the wound bed including Adherent Slough. The periwound skin appearance exhibited: Erythema. The periwound skin appearance did not exhibit: Callus, Crepitus, Excoriation, Induration, Rash, Scarring, Dry/Scaly, Maceration, Atrophie Blanche, Cyanosis, Ecchymosis, Hemosiderin Staining, Mottled, Pallor, Rubor. The surrounding wound skin color is noted with erythema which is circumferential. Periwound temperature was noted as No Abnormality. Assessment Active Problems ICD-10 L97.221 - Non-pressure chronic ulcer of left calf limited to breakdown of skin I89.0 - Lymphedema, not elsewhere classified She does have home health but does not know the company's name. She has been encouraged to contact us when she confirms the company name so we can provide them with appropriate orders. She will go to the hospital after this appointment for DVT study; once results received we will provide orders to home health. Pisgah, Tysha ([id number]) Plan Wound Cleansing: Wound #1 Left,Anterior Lower Leg: Clean wound with Normal Saline. Primary Wound Dressing: Wound #1 Left,Anterior Lower Leg: Mepitel One Contact layer Secondary Dressing: Wound #1 Left,Anterior Lower Leg: Non-adherent pad - over red areas on lower leg Dressing Change Frequency: Other: - Twice weekly, however HHRN needs to change on Friday 5/24 and on Monday 5/27, patient will return to clinic on Thursday 5/30. Follow-up Appointments: Wound #1 Left,Anterior Lower Leg: Return Appointment in 1 week. Edema Control: Wound #1 Left,Anterior Lower Leg: 3 Layer Compression System - Bilateral Compression Pump: Use compression pump on left lower extremity for 30 minutes, twice daily. - use for 45 minutes BID Compression Pump: Use compression pump on right lower extremity for 30 minutes, twice daily. - use 45 minutes  BID Home Health: Wound #1 Left,Anterior Lower Leg: Initiate Home Health for Skilled Nursing Continue Home Health Visits Home Health Nurse may visit PRN to address patient s wound care needs. FACE TO FACE ENCOUNTER: MEDICARE and MEDICAID PATIENTS: I certify that this patient is under my care and that I had a face-to-face encounter that meets the physician face-to-face encounter requirements with this patient on this date. The encounter with the patient was in whole or in part for the following MEDICAL CONDITION: (primary reason for Home Healthcare) MEDICAL NECESSITY: I certify, that based on my findings, NURSING services are a medically necessary home health service. HOME BOUND STATUS: I certify that my clinical findings support that this patient is homebound (i.e., Due to illness or injury, pt requires aid of supportive devices such as crutches, cane, wheelchairs, walkers, the use of special transportation or the assistance of another person to leave their place of residence. There is a normal inability to leave the home and doing so requires considerable and taxing effort. Other absences are for medical reasons / religious services and are infrequent or of short duration when for other reasons). If current dressing causes regression in wound condition, may D/C ordered dressing product/s and apply Normal Saline Moist Dressing daily until next Wound Healing Center / Other MD appointment. Notify Wound Healing Center of regression in wound condition at [phone]. Please direct any NON-WOUND related issues/requests for orders to patient's Primary Care Physician Services and Therapies ordered were: Venous Studies -Bilateral - ultrasound bilateral to rule out DVT Notes 06/01/17 1505- received a call from [facility] radiology regarding DVT studies; verbal and printed report is no evidence of DVT in  either lower extremity; patient will be notified and hopefully provide [place] with the home health agency  so we can resubmit orders Electronic Signature(s) Signed: 06/02/2017 8:19:03 AM By: Bonnell Public Previous Signature: 06/01/2017 3:07:10 PM Version By: Bonnell Public Previous Signature: 06/01/2017 12:14:37 PM Version By: Bonnell Public Previous Signature: 06/01/2017 12:13:02 PM Version By: Bonnell Public Entered By: Bonnell Public on 06/02/2017 08:19:03 ALSACE, DOWD ([id number]) Broadway, Noreene ([id number]) -------------------------------------------------------------------------------- ROS/PFSH Details Patient Name: [name] Date of Service: 06/01/2017 9:45 AM Medical Record Number: [id number] Patient Account Number: [id number] Date of Birth/Sex: 01-14-1926 (84 y.o. Female) Treating RN: Curtis Sites Primary Care Provider: Leotis Shames Other Clinician: Referring Provider: Ignacia Bayley Treating Provider/Extender: Kathreen Cosier in Treatment: 0 Information Obtained From Patient Wound History Do you currently have one or more open woundso Yes How many open wounds do you currently haveo 1 Approximately how long have you had your woundso 5-6 months How have you been treating your wound(s) until nowo unna boot Has your wound(s) ever healed and then re-openedo No Have you had any lab work done in the past montho No Have you tested positive for an antibiotic resistant organism (MRSA, VRE)o No Have you tested positive for osteomyelitis (bone infection)o No Have you had any tests for circulation on your legso No Constitutional Symptoms (General Health) Complaints and Symptoms: Negative for: Fatigue; Fever; Chills; Marked Weight Change Eyes Complaints and Symptoms: Negative for: Dry Eyes; Vision Changes; Glasses / Contacts Medical History: Positive for: Cataracts Negative for: Glaucoma; Optic Neuritis Ear/Nose/Mouth/Throat Complaints and Symptoms: Negative for: Difficult clearing ears; Sinusitis Medical History: Negative for: Chronic sinus problems/congestion; Middle ear  problems Hematologic/Lymphatic Complaints and Symptoms: Negative for: Bleeding / Clotting Disorders; Human Immunodeficiency Virus Medical History: Positive for: Lymphedema Negative for: Anemia; Hemophilia; Human Immunodeficiency Virus; Sickle Cell Disease Respiratory Complaints and Symptoms: Negative for: Chronic or frequent coughs; Shortness of Breath Medical HistoryTASHARI, SCHOENFELDER ([id number]) Negative for: Aspiration; Asthma; Chronic Obstructive Pulmonary Disease (COPD); Pneumothorax; Sleep Apnea; Tuberculosis Cardiovascular Complaints and Symptoms: Positive for: LE edema Negative for: Chest pain Medical History: Positive for: Hypertension; Peripheral Venous Disease Negative for: Angina; Arrhythmia; Congestive Heart Failure; Coronary Artery Disease; Deep Vein Thrombosis; Hypotension; Myocardial Infarction; Peripheral Arterial Disease; Phlebitis; Vasculitis Gastrointestinal Complaints and Symptoms: Negative for: Frequent diarrhea; Nausea; Vomiting Medical History: Negative for: Cirrhosis ; Colitis; Crohnos; Hepatitis A; Hepatitis B; Hepatitis C Endocrine Complaints and Symptoms: Negative for: Hepatitis; Thyroid disease; Polydypsia (Excessive Thirst) Medical History: Negative for: Type I Diabetes; Type II Diabetes Genitourinary Complaints and Symptoms: Positive for: Incontinence/dribbling Negative for: Kidney failure/ Dialysis Medical History: Negative for: End Stage Renal Disease Immunological Complaints and Symptoms: Negative for: Hives; Itching Medical History: Negative for: Lupus Erythematosus; Raynaudos; Scleroderma Integumentary (Skin) Complaints and Symptoms: Positive for: Wounds Negative for: Bleeding or bruising tendency; Breakdown; Swelling Medical History: Negative for: History of Burn; History of pressure wounds Musculoskeletal Complaints and Symptoms: Positive for: Muscle Weakness Hinton, Anaysia ([id number]) Negative for: Muscle Pain Medical  History: Positive for: Osteoarthritis Negative for: Gout; Rheumatoid Arthritis; Osteomyelitis Neurologic Complaints and Symptoms: Negative for: Numbness/parasthesias; Focal/Weakness Medical History: Negative for: Dementia; Neuropathy; Quadriplegia; Paraplegia; Seizure Disorder Psychiatric Complaints and Symptoms: Negative for: Anxiety; Claustrophobia Medical History: Negative for: Anorexia/bulimia; Confinement Anxiety Oncologic Medical History: Negative for: Received Chemotherapy; Received Radiation HBO Extended History Items Eyes: Cataracts Immunizations Pneumococcal Vaccine: Received Pneumococcal Vaccination: Yes Implantable Devices Family and Social History Cancer: No; Diabetes: Yes - Father; Heart Disease: No; Hereditary Spherocytosis: No; Hypertension: No; Kidney Disease: No; Lung Disease: No; Seizures: No; Stroke:  No; Thyroid Problems: No; Tuberculosis: No; Never smoker; Marital Status - Widowed; Alcohol Use: Never; Drug Use: No History; Caffeine Use: Moderate; Financial Concerns: No; Food, Clothing or Shelter Needs: No; Support System Lacking: No; Transportation Concerns: No; Advanced Directives: No; Patient does not want information on Advanced Directives Electronic Signature(s) Signed: 06/01/2017 12:15:14 PM By: Bonnell Public Signed: 06/01/2017 5:31:29 PM By: Curtis Sites Entered By: Curtis Sites on 06/01/2017 10:25:50 [name] ([id number]) -------------------------------------------------------------------------------- SuperBill Details Patient Name: [name] Date of Service: 06/01/2017 Medical Record Number: [id number] Patient Account Number: [id number] Date of Birth/Sex: 1926-04-11 (84 y.o. Female) Treating RN: Renne Crigler Primary Care Provider: Leotis Shames Other Clinician: Referring Provider: Ignacia Bayley Treating Provider/Extender: Kathreen Cosier in Treatment: 0 Diagnosis Coding ICD-10 Codes Code Description [phone]  Non-pressure chronic ulcer of left calf limited to breakdown of skin I89.0 Lymphedema, not elsewhere classified Facility Procedures CPT4 Code: [id number] Description: 99213 - WOUND CARE VISIT-LEV 3 EST PT Modifier: Quantity: 1 Physician Procedures CPT4 Code: [id number] Description: WC PHYS LEVEL 3 o NEW PT ICD-10 Diagnosis Description L97.221 Non-pressure chronic ulcer of left calf limited to breakdo I89.0 Lymphedema, not elsewhere classified Modifier: wn of skin Quantity: 1 Electronic Signature(s) Signed: 06/01/2017 12:14:51 PM By: Bonnell Public Entered By: Bonnell Public on 06/01/2017 12:14:51

## 2017-06-07 DIAGNOSIS — R41841 Cognitive communication deficit: Secondary | ICD-10-CM | POA: Diagnosis not present

## 2017-06-07 DIAGNOSIS — R2689 Other abnormalities of gait and mobility: Secondary | ICD-10-CM | POA: Diagnosis not present

## 2017-06-07 DIAGNOSIS — D638 Anemia in other chronic diseases classified elsewhere: Secondary | ICD-10-CM | POA: Diagnosis not present

## 2017-06-07 DIAGNOSIS — I10 Essential (primary) hypertension: Secondary | ICD-10-CM | POA: Diagnosis not present

## 2017-06-07 DIAGNOSIS — L97923 Non-pressure chronic ulcer of unspecified part of left lower leg with necrosis of muscle: Secondary | ICD-10-CM | POA: Diagnosis not present

## 2017-06-07 DIAGNOSIS — M6281 Muscle weakness (generalized): Secondary | ICD-10-CM | POA: Diagnosis not present

## 2017-06-08 ENCOUNTER — Ambulatory Visit: Payer: Medicare HMO | Admitting: Nurse Practitioner

## 2017-06-12 DIAGNOSIS — R2689 Other abnormalities of gait and mobility: Secondary | ICD-10-CM | POA: Diagnosis not present

## 2017-06-12 DIAGNOSIS — D638 Anemia in other chronic diseases classified elsewhere: Secondary | ICD-10-CM | POA: Diagnosis not present

## 2017-06-12 DIAGNOSIS — I10 Essential (primary) hypertension: Secondary | ICD-10-CM | POA: Diagnosis not present

## 2017-06-12 DIAGNOSIS — I890 Lymphedema, not elsewhere classified: Secondary | ICD-10-CM | POA: Diagnosis not present

## 2017-06-12 DIAGNOSIS — M6281 Muscle weakness (generalized): Secondary | ICD-10-CM | POA: Diagnosis not present

## 2017-06-12 DIAGNOSIS — R41841 Cognitive communication deficit: Secondary | ICD-10-CM | POA: Diagnosis not present

## 2017-06-15 ENCOUNTER — Encounter: Payer: Medicare HMO | Attending: Nurse Practitioner | Admitting: Nurse Practitioner

## 2017-06-15 DIAGNOSIS — I10 Essential (primary) hypertension: Secondary | ICD-10-CM | POA: Insufficient documentation

## 2017-06-15 DIAGNOSIS — M1990 Unspecified osteoarthritis, unspecified site: Secondary | ICD-10-CM | POA: Insufficient documentation

## 2017-06-15 DIAGNOSIS — I739 Peripheral vascular disease, unspecified: Secondary | ICD-10-CM | POA: Insufficient documentation

## 2017-06-15 DIAGNOSIS — L97822 Non-pressure chronic ulcer of other part of left lower leg with fat layer exposed: Secondary | ICD-10-CM | POA: Diagnosis not present

## 2017-06-15 DIAGNOSIS — L97221 Non-pressure chronic ulcer of left calf limited to breakdown of skin: Secondary | ICD-10-CM | POA: Insufficient documentation

## 2017-06-15 DIAGNOSIS — I890 Lymphedema, not elsewhere classified: Secondary | ICD-10-CM | POA: Diagnosis not present

## 2017-06-16 NOTE — Progress Notes (Signed)
[name] [name] ([id number]) Visit Report for 06/15/2017 Arrival Information Details Patient Name: [name], [name] Date of Service: 06/15/2017 1:00 PM Medical Record Number: [id number] Patient Account Number: [id number] Date of Birth/Sex: [birth year] (84 y.o. F) Treating RN: [name] [name] Primary Care [name] [name]: [name] [name] Other Clinician: Referring [name] [name]: [name] [name] Treating [name] [name]/Extender: [name] [name] in Treatment: 2 Visit Information History Since Last Visit Added or deleted any medications: No Patient Arrived: Wheel Chair Any new allergies or adverse reactions: No Arrival Time: 13:38 Had a fall or experienced change in No Accompanied By: son activities of daily living that may affect Transfer Assistance: None risk of falls: Patient Identification Verified: Yes Signs or symptoms of abuse/neglect since last visito No Secondary Verification Process Completed: Yes Hospitalized since last visit: No Implantable device outside of the clinic excluding No cellular tissue based products placed in the center since last visit: Has Dressing in Place as Prescribed: Yes Has Compression in Place as Prescribed: Yes Pain Present Now: No Electronic Signature(s) Signed: 06/15/2017 2:49:54 PM By: [name] [name] Entered By: [name] [name] on 06/15/2017 13:39:03 [name] [name] ([id number]) -------------------------------------------------------------------------------- Encounter Discharge Information Details Patient Name: [name] [name] Date of Service: 06/15/2017 1:00 PM Medical Record Number: [id number] Patient Account Number: [id number] Date of Birth/Sex: Oct 03, 1926 (84 y.o. F) Treating RN: [name] [name] Primary Care Marcellous Snarski: [name] [name] Other Clinician: Referring Revia Nghiem: [name] [name] Treating [name] [name]/Extender: [name] [name] in Treatment: 2 Encounter Discharge Information Items Discharge Condition: Stable Ambulatory Status: Wheelchair Discharge Destination:  Home Transportation: Private Auto Accompanied By: grandson Schedule Follow-up Appointment: Yes Clinical Summary of Care: Electronic Signature(s) Signed: 06/15/2017 4:26:21 PM By: [name] [name] Entered By: [name] [name] on 06/15/2017 14:56:17 [name], [name] ([id number]) -------------------------------------------------------------------------------- Lower Extremity Assessment Details Patient Name: [name] [name] Date of Service: 06/15/2017 1:00 PM Medical Record Number: [id number] Patient Account Number: [id number] Date of Birth/Sex: 27-Jan-1926 (84 y.o. F) Treating RN: [name] [name] Primary Care Tiffine Henigan: [name] [name] Other Clinician: Referring Chayanne Filippi: [name] [name] Treating Zuri Bradway/Extender: [name] [name] in Treatment: 2 Edema Assessment Assessed: [Left: No] [Right: No] Edema: [Left: Ye] [Right: s] Calf Left: Right: Point of Measurement: 34 cm From Medial Instep cm cm Ankle Left: Right: Point of Measurement: 12 cm From Medial Instep cm cm Vascular Assessment Claudication: Claudication Assessment [Left:None] Pulses: Dorsalis Pedis Palpable: [Left:Yes] Posterior Tibial Extremity colors, hair growth, and conditions: Extremity Color: [Left:Hyperpigmented] Hair Growth on Extremity: [Left:No] Temperature of Extremity: [Left:Warm] Capillary Refill: [Left:< 3 seconds] Toe Nail Assessment Left: Right: Thick: Yes Discolored: Yes Deformed: Yes Improper Length and Hygiene: Yes Electronic Signature(s) Signed: 06/15/2017 2:49:54 PM By: [name] [name] Entered By: [name] [name] on 06/15/2017 13:49:06 [name], [name] ([id number]) -------------------------------------------------------------------------------- Multi Wound Chart Details Patient Name: [name] [name] Date of Service: 06/15/2017 1:00 PM Medical Record Number: [id number] Patient Account Number: [id number] Date of Birth/Sex: [birth year] (84 y.o. F) Treating RN: [name] [name] Primary Care [name] [name]: [name] [name]  Other Clinician: Referring [name] [name]: [name] [name] Treating [name] [name]/Extender: [name] [name] in Treatment: 2 Vital Signs Height(in): 69 Pulse(bpm): 68 Weight(lbs): 206 Blood Pressure(mmHg): 141/50 Body Mass Index(BMI): 30 Temperature(F): 98.2 Respiratory Rate 18 (breaths/min): Photos: [N/A:N/A] Wound Location: Left Lower Leg - Anterior N/A N/A Wounding Event: Gradually Appeared N/A N/A Primary Etiology: Venous Leg Ulcer N/A N/A Comorbid History: Cataracts, Lymphedema, N/A N/A Hypertension, Peripheral Venous Disease, Osteoarthritis Date Acquired: 12/26/2016 N/A N/A Weeks of Treatment: 2 N/A N/A Wound Status: Open N/A N/A Clustered Wound: Yes N/A N/A Clustered Quantity: 3 N/A N/A Measurements L x W x D 1x13.5x0.1 N/A N/A (cm) Area (cm) : 10.603 N/A  N/A Volume (cm) : 1.06 N/A N/A % Reduction in Area: -141.10% N/A N/A % Reduction in Volume: -140.90% N/A N/A Classification: Partial Thickness N/A N/A Exudate Amount: Large N/A N/A Exudate Type: Serous N/A N/A Exudate Color: amber N/A N/A Wound Margin: Flat and Intact N/A N/A Granulation Amount: Large (67-100%) N/A N/A Granulation Quality: Red N/A N/A Necrotic Amount: Small (1-33%) N/A N/A Exposed Structures: Fascia: No N/A N/A Fat Layer (Subcutaneous Tissue) Exposed: No Tendon: No Muscle: No [name], [name] ([id number]) Joint: No Bone: No Epithelialization: Large (67-100%) N/A N/A Periwound Skin Texture: Excoriation: No N/A N/A Induration: No Callus: No Crepitus: No Rash: No Scarring: No Periwound Skin Moisture: Maceration: No N/A N/A Dry/Scaly: No Periwound Skin Color: Erythema: Yes N/A N/A Atrophie Blanche: No Cyanosis: No Ecchymosis: No Hemosiderin Staining: No Mottled: No Pallor: No Rubor: No Erythema Location: Circumferential N/A N/A Temperature: No Abnormality N/A N/A Tenderness on Palpation: No N/A N/A Wound Preparation: Ulcer Cleansing: Other: soap N/A N/A and water Topical  Anesthetic Applied: None Treatment Notes Electronic Signature(s) Signed: 06/15/2017 2:21:37 PM By: [name] [name] Entered By: [name] [name] on 06/15/2017 14:21:37 [name] [name], [name] ([id number]) -------------------------------------------------------------------------------- Multi-Disciplinary Care Plan Details Patient Name: [name] [name], [name] Date of Service: 06/15/2017 1:00 PM Medical Record Number: [id number] Patient Account Number: [id number] Date of Birth/Sex: 23-Mar-1926 (84 y.o. F) Treating RN: [name] HaggisPinkerton, Debi Primary Care Antania Hoefling: [name] ShamesSINGH, [name] Other Clinician: Referring Justis Closser: [name] ShamesSINGH, [name] Treating Thornton Dohrmann/Extender: [name] Cosieroulter, [name] Weeks in Treatment: 2 Active Inactive ` Orientation to the Wound Care Program Nursing Diagnoses: Knowledge deficit related to the wound healing center program Goals: Patient/caregiver will verbalize understanding of the Wound Healing Center Program Date Initiated: 06/01/2017 Target Resolution Date: 06/22/2017 Goal Status: Active Interventions: Provide education on orientation to the wound center Notes: ` Wound/Skin Impairment Nursing Diagnoses: Impaired tissue integrity Goals: Patient/caregiver will verbalize understanding of skin care regimen Date Initiated: 06/01/2017 Target Resolution Date: 06/22/2017 Goal Status: Active Ulcer/skin breakdown will have a volume reduction of 30% by week 4 Date Initiated: 06/01/2017 Target Resolution Date: 06/22/2017 Goal Status: Active Interventions: Assess patient/caregiver ability to obtain necessary supplies Assess patient/caregiver ability to perform ulcer/skin care regimen upon admission and as needed Assess ulceration(s) every visit Treatment Activities: Skin care regimen initiated : 06/01/2017 Notes: Electronic Signature(s) Signed: 06/15/2017 2:43:37 PM By: Edythe ClarityPinkerton, Debra [name], [name] ([id number]) Entered By: Alejandro MullingPinkerton, Debra on 06/15/2017 14:08:50 [name], [name]  ([id number]) -------------------------------------------------------------------------------- Pain Assessment Details Patient Name: [name] [name], [name] Date of Service: 06/15/2017 1:00 PM Medical Record Number: [id number] Patient Account Number: [id number] Date of Birth/Sex: 23-Mar-1926 (84 y.o. F) Treating RN: [name] JasmineNg, Wendi Primary Care Sara Keys: [name] ShamesSINGH, [name] Other Clinician: Referring Brennon Otterness: [name] ShamesSINGH, [name] Treating Haniya Fern/Extender: [name] Cosieroulter, [name] Weeks in Treatment: 2 Active Problems Location of Pain Severity and Description of Pain Patient Has Paino No Site Locations With Dressing Change: No Pain Management and Medication Current Pain Management: Goals for Pain Management Topical or injectable lidocaine is offered to patient for acute pain when surgical debridement is performed. If needed, Patient is instructed to use over the counter pain medication for the following 24-48 hours after debridement. Wound care MDs do not prescribed pain medications. Patient has chronic pain or uncontrolled pain. Patient has been instructed to make an appointment with their Primary Care Physician for pain management. Electronic Signature(s) Signed: 06/15/2017 2:49:54 PM By: [name] JasmineNg, Wendi Entered By: [name] JasmineNg, Wendi on 06/15/2017 13:39:44 [name] [name], [name] ([id number]) -------------------------------------------------------------------------------- Patient/Caregiver Education Details Patient Name: [name] [name], [name] Date of Service: 06/15/2017 1:00 PM Medical Record Number: [id number] Patient Account Number: [id number] Date of Birth/Gender: 23-Mar-1926 (84 y.o.  F) Treating RN: [name] [name] Primary Care Physician: [name] [name] Other Clinician: Referring Physician: Leotis [name] Treating Physician/Extender: [name] [name] in Treatment: 2 Education Assessment Education Provided To: Patient Education Topics Provided Wound/Skin Impairment: Handouts: Caring for Your Ulcer Methods: Explain/Verbal Responses:  State content correctly Electronic Signature(s) Signed: 06/15/2017 4:26:21 PM By: [name] [name] Entered By: [name] [name] on 06/15/2017 14:56:31 [name], [name] ([id number]) -------------------------------------------------------------------------------- Wound Assessment Details Patient Name: [name] [name] Date of Service: 06/15/2017 1:00 PM Medical Record Number: [id number] Patient Account Number: [id number] Date of Birth/Sex: 02/06/1926 (84 y.o. F) Treating RN: [name] [name] Primary Care Idy Rawling: [name] [name] Other Clinician: Referring Dealva Lafoy: [name] [name] Treating Garritt Molyneux/Extender: [name] [name] in Treatment: 2 Wound Status Wound Number: 1 Primary Venous Leg Ulcer Etiology: Wound Location: Left Lower Leg - Anterior Wound Open Wounding Event: Gradually Appeared Status: Date Acquired: 12/26/2016 Comorbid Cataracts, Lymphedema, Hypertension, Weeks Of Treatment: 2 History: Peripheral Venous Disease, Osteoarthritis Clustered Wound: Yes Photos Photo Uploaded By: [name] [name] on 06/15/2017 14:06:23 Wound Measurements Length: (cm) 1 Width: (cm) 13.5 Depth: (cm) 0.1 Clustered Quantity: 3 Area: (cm) 10.603 Volume: (cm) 1.06 % Reduction in Area: -141.1% % Reduction in Volume: -140.9% Epithelialization: Large (67-100%) Tunneling: No Undermining: No Wound Description Classification: Partial Thickness Foul Od Wound Margin: Flat and Intact Slough/ Exudate Amount: Large Exudate Type: Serous Exudate Color: amber or After Cleansing: No Fibrino Yes Wound Bed Granulation Amount: Large (67-100%) Exposed Structure Granulation Quality: Red Fascia Exposed: No Necrotic Amount: Small (1-33%) Fat Layer (Subcutaneous Tissue) Exposed: No Necrotic Quality: Adherent Slough Tendon Exposed: No Muscle Exposed: No Joint Exposed: No Bone Exposed: No Periwound Skin Texture Texture Color No Abnormalities Noted: No No Abnormalities Noted: No [name], [name]  ([id number]) Callus: No Atrophie Blanche: No Crepitus: No Cyanosis: No Excoriation: No Ecchymosis: No Induration: No Erythema: Yes Rash: No Erythema Location: Circumferential Scarring: No Hemosiderin Staining: No Mottled: No Moisture Pallor: No No Abnormalities Noted: No Rubor: No Dry / Scaly: No Maceration: No Temperature / Pain Temperature: No Abnormality Wound Preparation Ulcer Cleansing: Other: soap and water, Topical Anesthetic Applied: None Treatment Notes Wound #1 (Left, Anterior Lower Leg) 1. Cleansed with: Clean wound with Normal Saline 2. Anesthetic Topical Lidocaine 4% cream to wound bed prior to debridement 3. Peri-wound Care: Moisturizing lotion 4. Dressing Applied: Mepitel 7. Secured with 3 Layer Compression System - Bilateral Notes unna boot to Ecologist) Signed: 06/15/2017 2:49:54 PM By: [name] [name] Entered By: [name] [name] on 06/15/2017 13:47:55 [name], [name] ([id number]) -------------------------------------------------------------------------------- Vitals Details Patient Name: [name] [name] Date of Service: 06/15/2017 1:00 PM Medical Record Number: [id number] Patient Account Number: [id number] Date of Birth/Sex: Nov 28, 1926 (84 y.o. F) Treating RN: [name] [name] Primary Care Daysha Ashmore: [name] [name] Other Clinician: Referring Nature Kueker: [name] [name] Treating Shakaria Raphael/Extender: [name] [name] in Treatment: 2 Vital Signs Time Taken: 13:30 Temperature (F): 98.2 Height (in): 69 Pulse (bpm): 68 Weight (lbs): 206 Respiratory Rate (breaths/min): 18 Body Mass Index (BMI): 30.4 Blood Pressure (mmHg): 141/50 Reference Range: 80 - 120 mg / dl Electronic Signature(s) Signed: 06/15/2017 2:49:54 PM By: [name] [name] Entered By: [name] [name] on 06/15/2017 13:40:15

## 2017-06-16 NOTE — Progress Notes (Addendum)
[name] [name], [name] [name] ([id number]) Visit Report for 06/15/2017 Chief Complaint Document Details Patient Name: [name] [name], [name] [name] Date of Service: 06/15/2017 1:00 PM Medical Record Number: [id number] Patient Account Number: [id number] Date of Birth/Sex: [birth year] (84 y.o. F) Treating RN: Phillis Haggis Primary Care Provider: Leotis Shames Other Clinician: Referring Provider: Leotis Shames Treating Provider/Extender: Kathreen Cosier in Treatment: 2 Information Obtained from: Patient Chief Complaint LLE venous/lymphedema ulcer Electronic Signature(s) Signed: 06/15/2017 2:21:49 PM By: Bonnell Public Entered By: Bonnell Public on 06/15/2017 14:21:49 [name] [name], [name] [name] ([id number]) -------------------------------------------------------------------------------- HPI Details Patient Name: [name] [name] Date of Service: 06/15/2017 1:00 PM Medical Record Number: [id number] Patient Account Number: [id number] Date of Birth/Sex: [birth year] (84 y.o. F) Treating RN: Phillis Haggis Primary Care Provider: Leotis Shames Other Clinician: Referring Provider: Leotis Shames Treating Provider/Extender: Kathreen Cosier in Treatment: 2 History of Present Illness HPI Description: 06/01/17-She is here for initial evaluation of left lower extremity ulcers. She states these have been present for approximately 6-7 months; she is unclear exactly who she has followed with, states [facility], people in [place]. She reports a history of lymphedema pumps with extremely sporadic use over the last 2 years, none recently. She has been treated with Unna boots in the left lower extremity, no compression to the right lower extremity. She states her lower extremity edema is at/near baseline, I have low suspicion for DVT prior to aggressively compressing and initiating lymphdema pumps twice daily; she denies IVC filter. I see no recent ultrasound in Epic; last DVT study was to the left in February. She denies pain, low  suspicion of cellulitis. 06/15/17-She is here in follow-up evaluation for bilateral lower extremity edema and left lower extremity ulcers. She was a no call/no show at last week's appointment so we have been unable to confirm her home health agency. She did go for the ordered venous ultrasound 2 weeks ago which was negative for DVT bilaterally. Her family member confirms that she was discharged from [facility] and that they ordered home health at discharge; we will reach out to them. Despite this home health has been applying unna boots to the left leg. Electronic Signature(s) Signed: 06/22/2017 2:15:14 PM By: Bonnell Public Previous Signature: 06/15/2017 2:30:16 PM Version By: Bonnell Public Previous Signature: 06/15/2017 1:27:33 PM Version By: Bonnell Public Entered By: Bonnell Public on 06/22/2017 14:15:13 [name] [name] ([id number]) -------------------------------------------------------------------------------- Physician Orders Details Patient Name: [name] [name] Date of Service: 06/15/2017 1:00 PM Medical Record Number: [id number] Patient Account Number: [id number] Date of Birth/Sex: 1926-11-11 (84 y.o. F) Treating RN: Phillis Haggis Primary Care Provider: Leotis Shames Other Clinician: Referring Provider: Leotis Shames Treating Provider/Extender: Kathreen Cosier in Treatment: 2 Verbal / Phone Orders: Yes Clinician: Pinkerton, Debi Read Back and Verified: Yes Diagnosis Coding Wound Cleansing Wound #1 Left,Anterior Lower Leg o Clean wound with Normal Saline. o Cleanse wound with mild soap and water Anesthetic (add to Medication List) Wound #1 Left,Anterior Lower Leg o Topical Lidocaine 4% cream applied to wound bed prior to debridement (In Clinic Only). Primary Wound Dressing Wound #1 Left,Anterior Lower Leg o Mepitel One Contact layer Secondary Dressing Wound #1 Left,Anterior Lower Leg o ABD pad Dressing Change Frequency o Dressing is to be  changed Monday and Thursday. - HHRN to change on Mondays and pt will come to wound care center on Thrusdays Follow-up Appointments Wound #1 Left,Anterior Lower Leg o Return Appointment in 1 week. Edema Control Wound #1 Left,Anterior Lower Leg o 3 Layer Compression System - Bilateral - unna to anchor o Compression Pump:  Use compression pump on left lower extremity for 30 minutes, twice daily. - use for 45 minutes BID o Compression Pump: Use compression pump on right lower extremity for 30 minutes, twice daily. - use 45 minutes BID Additional Orders / Instructions Wound #1 Left,Anterior Lower Leg o Increase protein intake. Home Health Wound #1 Left,Anterior Lower Leg o Continue Home Health Visits o Home Health Nurse may visit PRN to address patientos wound care needs. [name] [name] ([id number]) o FACE TO FACE ENCOUNTER: MEDICARE and MEDICAID PATIENTS: I certify that this patient is under my care and that I had a face-to-face encounter that meets the physician face-to-face encounter requirements with this patient on this date. The encounter with the patient was in whole or in part for the following MEDICAL CONDITION: (primary reason for Home Healthcare) MEDICAL NECESSITY: I certify, that based on my findings, NURSING services are a medically necessary home health service. HOME BOUND STATUS: I certify that my clinical findings support that this patient is homebound (i.e., Due to illness or injury, pt requires aid of supportive devices such as crutches, cane, wheelchairs, walkers, the use of special transportation or the assistance of another person to leave their place of residence. There is a normal inability to leave the home and doing so requires considerable and taxing effort. Other absences are for medical reasons / religious services and are infrequent or of short duration when for other reasons). o If current dressing causes regression in wound condition, may D/C  ordered dressing product/s and apply Normal Saline Moist Dressing daily until next Wound Healing Center / Other MD appointment. Notify Wound Healing Center of regression in wound condition at [phone]. o Please direct any NON-WOUND related issues/requests for orders to patient's Primary Care Physician Patient Medications Allergies: No Known Allergies Notifications Medication Indication Start End lidocaine DOSE 1 - topical 4 % cream - 1 cream topical Electronic Signature(s) Signed: 06/15/2017 2:43:37 PM By: Alejandro Mulling Signed: 06/15/2017 4:23:13 PM By: Bonnell Public Entered By: Alejandro Mulling on 06/15/2017 14:14:39 Bischoff, Cathryn ([id number]) -------------------------------------------------------------------------------- Prescription 06/15/2017 Patient Name: [name] [name] Provider: Bonnell Public NP Date of Birth: [birth year] NPI#: [id number] Sex: F DEA#: [id number] Phone #: [phone] License #: Patient Address: [facility] [address] [facility] [place], [place] [address] [address], [address] [place], [place] [address] [phone] Allergies No Known Allergies Medication Medication: Route: Strength: Form: lidocaine topical 4% cream Class: TOPICAL LOCAL ANESTHETICS Dose: Frequency / Time: Indication: 1 1 cream topical Number of Refills: Number of Units: 0 Generic Substitution: Start Date: End Date: Administered at Substitution Permitted Facility: Yes Time Administered: Time Discontinued: Note to Pharmacy: Signature(s): Date(s): Electronic Signature(s) Signed: 06/15/2017 2:43:37 PM By: Alejandro Mulling Signed: 06/15/2017 4:23:13 PM By: Bonnell Public Entered By: Alejandro Mulling on 06/15/2017 14:14:40 Pinard, Ivis ([id number]) Cathlean Cower, Jaycelyn ([id number]) --------------------------------------------------------------------------------  Problem List Details Patient Name: [name] [name] Date of Service: 06/15/2017 1:00 PM Medical Record Number: [id number] Patient Account Number: [id number] Date of Birth/Sex: Sep 01, 1926 (84 y.o. F) Treating RN: Phillis Haggis Primary Care Provider: Leotis Shames Other Clinician: Referring Provider: Leotis Shames Treating Provider/Extender: Kathreen Cosier in Treatment: 2 Active Problems ICD-10 Impacting Encounter Code Description Active Date Wound Healing Diagnosis L97.221 Non-pressure chronic ulcer of left calf limited to breakdown of 06/01/2017 No Yes skin I89.0 Lymphedema, not elsewhere classified 06/01/2017 No Yes Inactive Problems Resolved Problems Electronic Signature(s) Signed: 06/15/2017 2:21:29 PM By: Bonnell Public Entered By: Bonnell Public on 06/15/2017 14:21:29 Magistro, Genee ([id number]) -------------------------------------------------------------------------------- Progress Note  Details Patient Name: [name] [name], [name] [name] Date of Service: 06/15/2017 1:00 PM Medical Record Number: [id number] Patient Account Number: [id number] Date of Birth/Sex: 1926/07/23 (84 y.o. F) Treating RN: Phillis HaggisPinkerton, Debi Primary Care Provider: Leotis ShamesSINGH, JASMINE Other Clinician: Referring Provider: Leotis ShamesSINGH, JASMINE Treating Provider/Extender: Kathreen Cosieroulter, Gracen Southwell Weeks in Treatment: 2 Subjective Chief Complaint Information obtained from Patient LLE venous/lymphedema ulcer History of Present Illness (HPI) 06/01/17-She is here for initial evaluation of left lower extremity ulcers. She states these have been present for approximately 6-7 months; she is unclear exactly who she has followed with, states [facility], people in [place]. She reports a history of lymphedema pumps with extremely sporadic use over the last 2 years, none recently. She has been treated with Unna boots in the left lower extremity, no compression to the right lower extremity. She states her lower extremity edema is at/near baseline, I have low suspicion for DVT prior to  aggressively compressing and initiating lymphdema pumps twice daily; she denies IVC filter. I see no recent ultrasound in Epic; last DVT study was to the left in February. She denies pain, low suspicion of cellulitis. 06/15/17-She is here in follow-up evaluation for bilateral lower extremity edema and left lower extremity ulcers. She was a no call/no show at last week's appointment so we have been unable to confirm her home health agency. She did go for the ordered venous ultrasound 2 weeks ago which was negative for DVT bilaterally. Her family member confirms that she was discharged from [facility] and that they ordered home health at discharge; we will reach out to them. Despite this home health has been applying unna boots to the left leg. Patient History Information obtained from Patient. Family History Diabetes - Father, No family history of Cancer, Heart Disease, Hereditary Spherocytosis, Hypertension, Kidney Disease, Lung Disease, Seizures, Stroke, Thyroid Problems, Tuberculosis. Social History Never smoker, Marital Status - Widowed, Alcohol Use - Never, Drug Use - No History, Caffeine Use - Moderate. Objective Constitutional Vitals Time Taken: 1:30 PM, Height: 69 in, Weight: 206 lbs, BMI: 30.4, Temperature: 98.2 F, Pulse: 68 bpm, Respiratory Rate: 18 breaths/min, Blood Pressure: 141/50 mmHg. [name] [name], [name] [name] ([id number]) Integumentary (Hair, Skin) Wound #1 status is Open. Original cause of wound was Gradually Appeared. The wound is located on the Left,Anterior Lower Leg. The wound measures 1cm length x 13.5cm width x 0.1cm depth; 10.603cm^2 area and 1.06cm^3 volume. There is no tunneling or undermining noted. There is a large amount of serous drainage noted. The wound margin is flat and intact. There is large (67-100%) red granulation within the wound bed. There is a small (1-33%) amount of necrotic tissue within the wound bed including Adherent Slough. The periwound  skin appearance exhibited: Erythema. The periwound skin appearance did not exhibit: Callus, Crepitus, Excoriation, Induration, Rash, Scarring, Dry/Scaly, Maceration, Atrophie Blanche, Cyanosis, Ecchymosis, Hemosiderin Staining, Mottled, Pallor, Rubor. The surrounding wound skin color is noted with erythema which is circumferential. Periwound temperature was noted as No Abnormality. Assessment Active Problems ICD-10 Non-pressure chronic ulcer of left calf limited to breakdown of skin Lymphedema, not elsewhere classified Plan Wound Cleansing: Wound #1 Left,Anterior Lower Leg: Clean wound with Normal Saline. Cleanse wound with mild soap and water Anesthetic (add to Medication List): Wound #1 Left,Anterior Lower Leg: Topical Lidocaine 4% cream applied to wound bed prior to debridement (In Clinic Only). Primary Wound Dressing: Wound #1 Left,Anterior Lower Leg: Mepitel One Contact layer Secondary Dressing: Wound #1 Left,Anterior Lower Leg: ABD pad Dressing Change Frequency: Dressing is to be changed Monday and  Thursday. - HHRN to change on Mondays and pt will come to wound care center on Thrusdays Follow-up Appointments: Wound #1 Left,Anterior Lower Leg: Return Appointment in 1 week. Edema Control: Wound #1 Left,Anterior Lower Leg: 3 Layer Compression System - Bilateral - unna to anchor Compression Pump: Use compression pump on left lower extremity for 30 minutes, twice daily. - use for 45 minutes BID Compression Pump: Use compression pump on right lower extremity for 30 minutes, twice daily. - use 45 minutes BID Additional Orders / Instructions: Wound #1 Left,Anterior Lower Leg: Increase protein intake. Home Health: Wound #1 Left,Anterior Lower Leg: [name] [name], [name] [name] ([id number]) Continue Home Health Visits Home Health Nurse may visit PRN to address patient s wound care needs. FACE TO FACE ENCOUNTER: MEDICARE and MEDICAID PATIENTS: I certify that this patient is under my care and  that I had a face-to-face encounter that meets the physician face-to-face encounter requirements with this patient on this date. The encounter with the patient was in whole or in part for the following MEDICAL CONDITION: (primary reason for Home Healthcare) MEDICAL NECESSITY: I certify, that based on my findings, NURSING services are a medically necessary home health service. HOME BOUND STATUS: I certify that my clinical findings support that this patient is homebound (i.e., Due to illness or injury, pt requires aid of supportive devices such as crutches, cane, wheelchairs, walkers, the use of special transportation or the assistance of another person to leave their place of residence. There is a normal inability to leave the home and doing so requires considerable and taxing effort. Other absences are for medical reasons / religious services and are infrequent or of short duration when for other reasons). If current dressing causes regression in wound condition, may D/C ordered dressing product/s and apply Normal Saline Moist Dressing daily until next Wound Healing Center / Other MD appointment. Notify Wound Healing Center of regression in wound condition at [phone]. Please direct any NON-WOUND related issues/requests for orders to patient's Primary Care Physician The following medication(s) was prescribed: lidocaine topical 4 % cream 1 1 cream topical was prescribed at facility Electronic Signature(s) Signed: 06/22/2017 2:15:29 PM By: Bonnell Public Previous Signature: 06/15/2017 2:31:02 PM Version By: Bonnell Public Entered By: Bonnell Public on 06/22/2017 14:15:29 [name] [name], [name] [name] ([id number]) -------------------------------------------------------------------------------- ROS/PFSH Details Patient Name: [name] [name] Date of Service: 06/15/2017 1:00 PM Medical Record Number: [id number] Patient Account Number: [id number] Date of Birth/Sex: Apr 28, 1926 (84 y.o. F) Treating RN: Phillis Haggis Primary Care Provider: Leotis Shames Other Clinician: Referring Provider: Leotis Shames Treating Provider/Extender: Kathreen Cosier in Treatment: 2 Information Obtained From Patient Wound History Do you currently have one or more open woundso Yes How many open wounds do you currently haveo 1 Approximately how long have you had your woundso 5-6 months How have you been treating your wound(s) until nowo unna boot Has your wound(s) ever healed and then re-openedo No Have you had any lab work done in the past montho No Have you tested positive for an antibiotic resistant organism (MRSA, VRE)o No Have you tested positive for osteomyelitis (bone infection)o No Have you had any tests for circulation on your legso No Eyes Medical History: Positive for: Cataracts Negative for: Glaucoma; Optic Neuritis Ear/Nose/Mouth/Throat Medical History: Negative for: Chronic sinus problems/congestion; Middle ear problems Hematologic/Lymphatic Medical History: Positive for: Lymphedema Negative for: Anemia; Hemophilia; Human Immunodeficiency Virus; Sickle Cell Disease Respiratory Medical History: Negative for: Aspiration; Asthma; Chronic Obstructive Pulmonary Disease (COPD); Pneumothorax; Sleep Apnea; Tuberculosis Cardiovascular Medical History: Positive for: Hypertension; Peripheral Venous  Disease Negative for: Angina; Arrhythmia; Congestive Heart Failure; Coronary Artery Disease; Deep Vein Thrombosis; Hypotension; Myocardial Infarction; Peripheral Arterial Disease; Phlebitis; Vasculitis Gastrointestinal Medical History: Negative for: Cirrhosis ; Colitis; Crohnos; Hepatitis A; Hepatitis B; Hepatitis C Endocrine [name] [name], [name] [name] ([id number]) Medical History: Negative for: Type I Diabetes; Type II Diabetes Genitourinary Medical History: Negative for: End Stage Renal Disease Immunological Medical History: Negative for: Lupus Erythematosus; Raynaudos; Scleroderma Integumentary  (Skin) Medical History: Negative for: History of Burn; History of pressure wounds Musculoskeletal Medical History: Positive for: Osteoarthritis Negative for: Gout; Rheumatoid Arthritis; Osteomyelitis Neurologic Medical History: Negative for: Dementia; Neuropathy; Quadriplegia; Paraplegia; Seizure Disorder Oncologic Medical History: Negative for: Received Chemotherapy; Received Radiation Psychiatric Medical History: Negative for: Anorexia/bulimia; Confinement Anxiety HBO Extended History Items Eyes: Cataracts Immunizations Pneumococcal Vaccine: Received Pneumococcal Vaccination: Yes Implantable Devices Family and Social History Cancer: No; Diabetes: Yes - Father; Heart Disease: No; Hereditary Spherocytosis: No; Hypertension: No; Kidney Disease: No; Lung Disease: No; Seizures: No; Stroke: No; Thyroid Problems: No; Tuberculosis: No; Never smoker; Marital Status - Widowed; Alcohol Use: Never; Drug Use: No History; Caffeine Use: Moderate; Financial Concerns: No; Food, Clothing or Shelter Needs: No; Support System Lacking: No; Transportation Concerns: No; Advanced Directives: No; Patient does not want information on Advanced Directives Physician Affirmation I have reviewed and agree with the above information. [name] [name], [name] [name] ([id number]) Electronic Signature(s) Signed: 06/15/2017 2:43:37 PM By: Alejandro Mulling Signed: 06/15/2017 4:23:13 PM By: Bonnell Public Entered By: Bonnell Public on 06/15/2017 14:30:44 [name] [name], [name] [name] ([id number]) -------------------------------------------------------------------------------- SuperBill Details Patient Name: [name] [name] Date of Service: 06/15/2017 Medical Record Number: [id number] Patient Account Number: [id number] Date of Birth/Sex: 08-03-1926 (84 y.o. F) Treating RN: Phillis Haggis Primary Care Provider: Leotis Shames Other Clinician: Referring Provider: Leotis Shames Treating Provider/Extender: Kathreen Cosier in Treatment:  2 Diagnosis Coding ICD-10 Codes Code Description [phone] Non-pressure chronic ulcer of left calf limited to breakdown of skin I89.0 Lymphedema, not elsewhere classified Facility Procedures CPT4: Description Modifier Quantity Code [id number] BILATERAL: Application of multi-layer venous compression system; leg (below 1 knee), including ankle and foot. Physician Procedures CPT4 Code: [id number] Description: 99213 - WC PHYS LEVEL 3 - EST PT ICD-10 Diagnosis Description L97.221 Non-pressure chronic ulcer of left calf limited to breakdown I89.0 Lymphedema, not elsewhere classified Modifier: of skin Quantity: 1 Electronic Signature(s) Signed: 06/15/2017 4:23:13 PM By: Bonnell Public Signed: 06/15/2017 4:25:44 PM By: Alejandro Mulling Previous Signature: 06/15/2017 2:35:45 PM Version By: Bonnell Public Entered By: Alejandro Mulling on 06/15/2017 16:18:20

## 2017-06-19 DIAGNOSIS — R2689 Other abnormalities of gait and mobility: Secondary | ICD-10-CM | POA: Diagnosis not present

## 2017-06-19 DIAGNOSIS — R41841 Cognitive communication deficit: Secondary | ICD-10-CM | POA: Diagnosis not present

## 2017-06-19 DIAGNOSIS — M6281 Muscle weakness (generalized): Secondary | ICD-10-CM | POA: Diagnosis not present

## 2017-06-19 DIAGNOSIS — I890 Lymphedema, not elsewhere classified: Secondary | ICD-10-CM | POA: Diagnosis not present

## 2017-06-19 DIAGNOSIS — D638 Anemia in other chronic diseases classified elsewhere: Secondary | ICD-10-CM | POA: Diagnosis not present

## 2017-06-19 DIAGNOSIS — I10 Essential (primary) hypertension: Secondary | ICD-10-CM | POA: Diagnosis not present

## 2017-06-22 ENCOUNTER — Ambulatory Visit: Payer: Medicare HMO | Admitting: Nurse Practitioner

## 2017-06-28 DIAGNOSIS — D638 Anemia in other chronic diseases classified elsewhere: Secondary | ICD-10-CM | POA: Diagnosis not present

## 2017-06-28 DIAGNOSIS — I10 Essential (primary) hypertension: Secondary | ICD-10-CM | POA: Diagnosis not present

## 2017-06-28 DIAGNOSIS — M6281 Muscle weakness (generalized): Secondary | ICD-10-CM | POA: Diagnosis not present

## 2017-06-28 DIAGNOSIS — I890 Lymphedema, not elsewhere classified: Secondary | ICD-10-CM | POA: Diagnosis not present

## 2017-06-28 DIAGNOSIS — R41841 Cognitive communication deficit: Secondary | ICD-10-CM | POA: Diagnosis not present

## 2017-06-28 DIAGNOSIS — R2689 Other abnormalities of gait and mobility: Secondary | ICD-10-CM | POA: Diagnosis not present

## 2017-06-29 ENCOUNTER — Ambulatory Visit: Payer: Medicare HMO | Admitting: Nurse Practitioner

## 2017-07-03 DIAGNOSIS — I890 Lymphedema, not elsewhere classified: Secondary | ICD-10-CM | POA: Diagnosis not present

## 2017-07-03 DIAGNOSIS — R2689 Other abnormalities of gait and mobility: Secondary | ICD-10-CM | POA: Diagnosis not present

## 2017-07-03 DIAGNOSIS — I10 Essential (primary) hypertension: Secondary | ICD-10-CM | POA: Diagnosis not present

## 2017-07-03 DIAGNOSIS — D638 Anemia in other chronic diseases classified elsewhere: Secondary | ICD-10-CM | POA: Diagnosis not present

## 2017-07-03 DIAGNOSIS — M6281 Muscle weakness (generalized): Secondary | ICD-10-CM | POA: Diagnosis not present

## 2017-07-03 DIAGNOSIS — R41841 Cognitive communication deficit: Secondary | ICD-10-CM | POA: Diagnosis not present

## 2017-07-05 DIAGNOSIS — R2689 Other abnormalities of gait and mobility: Secondary | ICD-10-CM | POA: Diagnosis not present

## 2017-07-05 DIAGNOSIS — R41841 Cognitive communication deficit: Secondary | ICD-10-CM | POA: Diagnosis not present

## 2017-07-05 DIAGNOSIS — D638 Anemia in other chronic diseases classified elsewhere: Secondary | ICD-10-CM | POA: Diagnosis not present

## 2017-07-05 DIAGNOSIS — M6281 Muscle weakness (generalized): Secondary | ICD-10-CM | POA: Diagnosis not present

## 2017-07-05 DIAGNOSIS — I890 Lymphedema, not elsewhere classified: Secondary | ICD-10-CM | POA: Diagnosis not present

## 2017-07-05 DIAGNOSIS — I10 Essential (primary) hypertension: Secondary | ICD-10-CM | POA: Diagnosis not present

## 2017-07-10 DIAGNOSIS — I10 Essential (primary) hypertension: Secondary | ICD-10-CM | POA: Diagnosis not present

## 2017-07-10 DIAGNOSIS — M6281 Muscle weakness (generalized): Secondary | ICD-10-CM | POA: Diagnosis not present

## 2017-07-10 DIAGNOSIS — R41841 Cognitive communication deficit: Secondary | ICD-10-CM | POA: Diagnosis not present

## 2017-07-10 DIAGNOSIS — D638 Anemia in other chronic diseases classified elsewhere: Secondary | ICD-10-CM | POA: Diagnosis not present

## 2017-07-10 DIAGNOSIS — R2689 Other abnormalities of gait and mobility: Secondary | ICD-10-CM | POA: Diagnosis not present

## 2017-07-10 DIAGNOSIS — I890 Lymphedema, not elsewhere classified: Secondary | ICD-10-CM | POA: Diagnosis not present

## 2017-07-17 DIAGNOSIS — I10 Essential (primary) hypertension: Secondary | ICD-10-CM | POA: Diagnosis not present

## 2017-07-17 DIAGNOSIS — M6281 Muscle weakness (generalized): Secondary | ICD-10-CM | POA: Diagnosis not present

## 2017-07-17 DIAGNOSIS — D638 Anemia in other chronic diseases classified elsewhere: Secondary | ICD-10-CM | POA: Diagnosis not present

## 2017-07-17 DIAGNOSIS — R2689 Other abnormalities of gait and mobility: Secondary | ICD-10-CM | POA: Diagnosis not present

## 2017-07-17 DIAGNOSIS — R41841 Cognitive communication deficit: Secondary | ICD-10-CM | POA: Diagnosis not present

## 2017-07-17 DIAGNOSIS — I890 Lymphedema, not elsewhere classified: Secondary | ICD-10-CM | POA: Diagnosis not present

## 2017-07-26 DIAGNOSIS — I10 Essential (primary) hypertension: Secondary | ICD-10-CM | POA: Diagnosis not present

## 2017-07-26 DIAGNOSIS — D638 Anemia in other chronic diseases classified elsewhere: Secondary | ICD-10-CM | POA: Diagnosis not present

## 2017-07-26 DIAGNOSIS — I890 Lymphedema, not elsewhere classified: Secondary | ICD-10-CM | POA: Diagnosis not present

## 2017-07-26 DIAGNOSIS — R2689 Other abnormalities of gait and mobility: Secondary | ICD-10-CM | POA: Diagnosis not present

## 2017-07-26 DIAGNOSIS — M6281 Muscle weakness (generalized): Secondary | ICD-10-CM | POA: Diagnosis not present

## 2017-07-26 DIAGNOSIS — R41841 Cognitive communication deficit: Secondary | ICD-10-CM | POA: Diagnosis not present

## 2017-08-03 DIAGNOSIS — I10 Essential (primary) hypertension: Secondary | ICD-10-CM | POA: Diagnosis not present

## 2017-08-03 DIAGNOSIS — M6281 Muscle weakness (generalized): Secondary | ICD-10-CM | POA: Diagnosis not present

## 2017-08-03 DIAGNOSIS — D638 Anemia in other chronic diseases classified elsewhere: Secondary | ICD-10-CM | POA: Diagnosis not present

## 2017-08-03 DIAGNOSIS — R41841 Cognitive communication deficit: Secondary | ICD-10-CM | POA: Diagnosis not present

## 2017-08-03 DIAGNOSIS — R2689 Other abnormalities of gait and mobility: Secondary | ICD-10-CM | POA: Diagnosis not present

## 2017-08-03 DIAGNOSIS — I890 Lymphedema, not elsewhere classified: Secondary | ICD-10-CM | POA: Diagnosis not present

## 2017-08-08 DIAGNOSIS — I10 Essential (primary) hypertension: Secondary | ICD-10-CM | POA: Diagnosis not present

## 2017-08-08 DIAGNOSIS — I890 Lymphedema, not elsewhere classified: Secondary | ICD-10-CM | POA: Diagnosis not present

## 2017-08-08 DIAGNOSIS — R2689 Other abnormalities of gait and mobility: Secondary | ICD-10-CM | POA: Diagnosis not present

## 2017-08-08 DIAGNOSIS — D638 Anemia in other chronic diseases classified elsewhere: Secondary | ICD-10-CM | POA: Diagnosis not present

## 2017-08-08 DIAGNOSIS — M6281 Muscle weakness (generalized): Secondary | ICD-10-CM | POA: Diagnosis not present

## 2017-08-08 DIAGNOSIS — R41841 Cognitive communication deficit: Secondary | ICD-10-CM | POA: Diagnosis not present

## 2017-08-17 ENCOUNTER — Emergency Department
Admission: EM | Admit: 2017-08-17 | Discharge: 2017-08-17 | Disposition: A | Discharge status: Home / Self Care | Payer: Medicare HMO | Attending: Emergency Medicine | Admitting: Emergency Medicine

## 2017-08-17 ENCOUNTER — Other Ambulatory Visit: Payer: Self-pay

## 2017-08-17 ENCOUNTER — Encounter: Payer: Self-pay | Admitting: Intensive Care

## 2017-08-17 DIAGNOSIS — M6281 Muscle weakness (generalized): Secondary | ICD-10-CM | POA: Diagnosis not present

## 2017-08-17 DIAGNOSIS — Z79899 Other long term (current) drug therapy: Secondary | ICD-10-CM | POA: Diagnosis not present

## 2017-08-17 DIAGNOSIS — R230 Cyanosis: Secondary | ICD-10-CM | POA: Insufficient documentation

## 2017-08-17 DIAGNOSIS — I440 Atrioventricular block, first degree: Secondary | ICD-10-CM | POA: Diagnosis not present

## 2017-08-17 DIAGNOSIS — R2689 Other abnormalities of gait and mobility: Secondary | ICD-10-CM | POA: Diagnosis not present

## 2017-08-17 DIAGNOSIS — I890 Lymphedema, not elsewhere classified: Secondary | ICD-10-CM | POA: Insufficient documentation

## 2017-08-17 DIAGNOSIS — D638 Anemia in other chronic diseases classified elsewhere: Secondary | ICD-10-CM | POA: Diagnosis not present

## 2017-08-17 DIAGNOSIS — R41841 Cognitive communication deficit: Secondary | ICD-10-CM | POA: Diagnosis not present

## 2017-08-17 DIAGNOSIS — R600 Localized edema: Secondary | ICD-10-CM | POA: Diagnosis present

## 2017-08-17 DIAGNOSIS — I10 Essential (primary) hypertension: Secondary | ICD-10-CM | POA: Diagnosis not present

## 2017-08-17 LAB — COMPREHENSIVE METABOLIC PANEL
ALBUMIN: 4.2 g/dL (ref 3.5–5.0)
ALK PHOS: 57 U/L (ref 38–126)
ALT: 11 U/L (ref 0–44)
ANION GAP: 7 (ref 5–15)
AST: 22 U/L (ref 15–41)
BILIRUBIN TOTAL: 0.6 mg/dL (ref 0.3–1.2)
BUN: 48 mg/dL — ABNORMAL HIGH (ref 8–23)
CALCIUM: 9.2 mg/dL (ref 8.9–10.3)
CO2: 27 mmol/L (ref 22–32)
Chloride: 107 mmol/L (ref 98–111)
Creatinine, Ser: 1.55 mg/dL — ABNORMAL HIGH (ref 0.44–1.00)
GFR calc non Af Amer: 29 mL/min — ABNORMAL LOW (ref >60)
GFR, EST AFRICAN AMERICAN: 33 mL/min — AB (ref >60)
GLUCOSE: 85 mg/dL (ref 70–99)
Potassium: 4.7 mmol/L (ref 3.5–5.1)
Sodium: 141 mmol/L (ref 135–145)
TOTAL PROTEIN: 7.9 g/dL (ref 6.5–8.1)

## 2017-08-17 LAB — CBC WITH DIFFERENTIAL/PLATELET
Basophils Absolute: 0.1 10*3/uL (ref 0–0.1)
Basophils Relative: 2 %
Eosinophils Absolute: 0.3 10*3/uL (ref 0–0.7)
Eosinophils Relative: 9 %
HEMATOCRIT: 30.8 % — AB (ref 35.0–47.0)
Hemoglobin: 10.2 g/dL — ABNORMAL LOW (ref 12.0–16.0)
LYMPHS ABS: 1.5 10*3/uL (ref 1.0–3.6)
LYMPHS PCT: 42 %
MCH: 30.3 pg (ref 26.0–34.0)
MCHC: 33.2 g/dL (ref 32.0–36.0)
MCV: 91.4 fL (ref 80.0–100.0)
MONOS PCT: 9 %
Monocytes Absolute: 0.3 10*3/uL (ref 0.2–0.9)
NEUTROS ABS: 1.4 10*3/uL (ref 1.4–6.5)
NEUTROS PCT: 38 %
Platelets: 207 10*3/uL (ref 150–440)
RBC: 3.37 MIL/uL — ABNORMAL LOW (ref 3.80–5.20)
RDW: 14.9 % — ABNORMAL HIGH (ref 11.5–14.5)
WBC: 3.6 10*3/uL (ref 3.6–11.0)

## 2017-08-17 LAB — LACTIC ACID, PLASMA: Lactic Acid, Venous: 0.7 mmol/L (ref 0.5–1.9)

## 2017-08-17 NOTE — ED Notes (Signed)
MD at bedside. 

## 2017-08-17 NOTE — Discharge Instructions (Signed)
Please continue wrapping both of your legs with Unna boots and return to the emergency department for any issues whatsoever.  It was a pleasure to take care of you today, and thank you for coming to our emergency department.  If you have any questions or concerns before leaving please ask the nurse to grab me and I'm more than happy to go through your aftercare instructions again.  If you were prescribed any opioid pain medication today such as Norco, Vicodin, Percocet, morphine, hydrocodone, or oxycodone please make sure you do not drive when you are taking this medication as it can alter your ability to drive safely.  If you have any concerns once you are home that you are not improving or are in fact getting worse before you can make it to your follow-up appointment, please do not hesitate to call 911 and come back for further evaluation.  Merrily BrittleNeil Christell Steinmiller, MD  Results for orders placed or performed during the hospital encounter of 08/17/17  CBC with Differential  Result Value Ref Range   WBC 3.6 3.6 - 11.0 K/uL   RBC 3.37 (L) 3.80 - 5.20 MIL/uL   Hemoglobin 10.2 (L) 12.0 - 16.0 g/dL   HCT 81.130.8 (L) 91.435.0 - 78.247.0 %   MCV 91.4 80.0 - 100.0 fL   MCH 30.3 26.0 - 34.0 pg   MCHC 33.2 32.0 - 36.0 g/dL   RDW 95.614.9 (H) 21.311.5 - 08.614.5 %   Platelets 207 150 - 440 K/uL   Neutrophils Relative % 38 %   Neutro Abs 1.4 1.4 - 6.5 K/uL   Lymphocytes Relative 42 %   Lymphs Abs 1.5 1.0 - 3.6 K/uL   Monocytes Relative 9 %   Monocytes Absolute 0.3 0.2 - 0.9 K/uL   Eosinophils Relative 9 %   Eosinophils Absolute 0.3 0 - 0.7 K/uL   Basophils Relative 2 %   Basophils Absolute 0.1 0 - 0.1 K/uL  Comprehensive metabolic panel  Result Value Ref Range   Sodium 141 135 - 145 mmol/L   Potassium 4.7 3.5 - 5.1 mmol/L   Chloride 107 98 - 111 mmol/L   CO2 27 22 - 32 mmol/L   Glucose, Bld 85 70 - 99 mg/dL   BUN 48 (H) 8 - 23 mg/dL   Creatinine, Ser 5.781.55 (H) 0.44 - 1.00 mg/dL   Calcium 9.2 8.9 - 46.910.3 mg/dL   Total  Protein 7.9 6.5 - 8.1 g/dL   Albumin 4.2 3.5 - 5.0 g/dL   AST 22 15 - 41 U/L   ALT 11 0 - 44 U/L   Alkaline Phosphatase 57 38 - 126 U/L   Total Bilirubin 0.6 0.3 - 1.2 mg/dL   GFR calc non Af Amer 29 (L) >60 mL/min   GFR calc Af Amer 33 (L) >60 mL/min   Anion gap 7 5 - 15  Lactic acid, plasma  Result Value Ref Range   Lactic Acid, Venous 0.7 0.5 - 1.9 mmol/L

## 2017-08-17 NOTE — ED Notes (Signed)
Asher MuirJamie (grandson) [phone]

## 2017-08-17 NOTE — ED Triage Notes (Signed)
Patient has swelling and drainage in bilateral lower extremities for many years. Patients home health nurse sent patient here for new discoloration in R toes. Patient denies pain

## 2017-08-17 NOTE — ED Provider Notes (Signed)
[facility] Emergency Department Provider Note  ____________________________________________   First MD Initiated Contact with Patient 08/17/17 1630     (approximate)  I have reviewed the triage vital signs and the nursing notes.   HISTORY  Chief Complaint Leg Swelling (right and left)   HPI [name] Tipps is a 84 y.o. female who sent to the emergency department by her home health nurse for evaluation of blue discoloration to bilateral feet that began today.  The patient has a long-standing history of chronic lymphedema and has Unna boots applied by home health nurse to her left lower extremity.  The patient does note that she recently purchased blue/green slippers that she has been wearing at home.  The patient denies fevers chills chest pain shortness of breath abdominal pain nausea vomiting or any change in her leg swelling.    Past Medical History:  Diagnosis Date  . Cellulitis 10/28/2016   bilateral lower extremities.     Patient Active Problem List   Diagnosis Date Noted  . Cellulitis 03/06/2017  . Left leg cellulitis 11/25/2016  . AKI (acute kidney injury) (HCC) 11/05/2016  . Pressure injury of skin 11/05/2016  . Elevated troponin 11/04/2016    Past Surgical History:  Procedure Laterality Date  . ABDOMINAL HYSTERECTOMY    . GANGLION CYST EXCISION      Prior to Admission medications   Medication Sig Start Date End Date Taking? Authorizing Provider  acetaminophen (TYLENOL) 325 MG tablet Take 2 tablets (650 mg total) every 6 (six) hours as needed by mouth for mild pain (or Fever >/= 101). 11/28/16   Ramonita Lab, MD  acidophilus (RISAQUAD) CAPS capsule Take 2 capsules 3 (three) times daily by mouth. 11/28/16   Gouru, Deanna Artis, MD  ascorbic acid (VITAMIN C) 100 MG tablet Take 1 tablet (100 mg total) by mouth daily. 11/07/16   Enedina Finner, MD  ferrous sulfate 325 (65 FE) MG tablet Take 1 tablet (325 mg total) by mouth daily. 11/07/16   Enedina Finner, MD  furosemide (LASIX) 20 MG tablet Take 1 tablet (20 mg total) by mouth daily as needed for edema. Patient taking differently: Take 20 mg by mouth 2 (two) times daily.  11/07/16 11/07/17  Enedina Finner, MD  Multiple Vitamins-Minerals (MULTIVITAL) tablet Take 1 tablet by mouth daily. 11/07/16   Enedina Finner, MD  potassium chloride (K-DUR,KLOR-CON) 10 MEQ tablet Take 10 mEq by mouth daily.    [provider]    Allergies Patient has no known allergies.  Family History  Problem Relation Age of Onset  . Diabetes Mellitus II Mother   . Heart disease Father     Social History Social History   Tobacco Use  . Smoking status: Never Smoker  . Smokeless tobacco: Never Used  Substance Use Topics  . Alcohol use: No  . Drug use: No    Review of Systems Constitutional: No fever/chills Eyes: No visual changes. Cardiovascular: Denies chest pain. Respiratory: Denies shortness of breath. Gastrointestinal: No abdominal pain.  No nausea, no vomiting.  Genitourinary: Negative for dysuria. Musculoskeletal: Negative for back pain. Skin: Negative for rash. Neurological: Negative for headaches, focal weakness or numbness.   ____________________________________________   PHYSICAL EXAM:  VITAL SIGNS: ED Triage Vitals  Enc Vitals Group     BP 08/17/17 1433 135/78     Pulse Rate 08/17/17 1433 61     Resp 08/17/17 1433 18     Temp 08/17/17 1433 98 F (36.7 C)     Temp  Source 08/17/17 1433 Oral     SpO2 08/17/17 1433 98 %     Weight 08/17/17 1431 220 lb (99.8 kg)     Height 08/17/17 1431 5\' 10"  (1.778 m)     Head Circumference --      Peak Flow --      Pain Score 08/17/17 1431 0     Pain Loc --      Pain Edu? --      Excl. in GC? --     Constitutional: Alert and oriented x4 appears somewhat frustrated although nontoxic no diaphoresis Eyes: PERRL EOMI. Head: Atraumatic. Nose: No congestion/rhinnorhea. Mouth/Throat: No trismus Neck: No stridor.   Cardiovascular:  Normal rate, regular rhythm. Grossly normal heart sounds.  Good peripheral circulation. Respiratory: Normal respiratory effort.  No retractions. Lungs CTAB and moving good air Gastrointestinal: Orbitally obese soft nontender Musculoskeletal: Profound lymphedema bilateral lower extremities with left greater than right.  Left leg is weeping although no evidence of infection Neurologic:  Normal speech and language. No gross focal neurologic deficits are appreciated. Skin: Faint blue discoloration to bilateral feet easily wiped soft with alcohol swab 2+ dorsalis pedis pulses bilaterally with less than 2-second capillary refill Psychiatric: Frustrated and borderline angry    ____________________________________________   DIFFERENTIAL includes but not limited to  Skin dye, cyanosis, arterial occlusion ____________________________________________   LABS (all labs ordered are listed, but only abnormal results are displayed)  Labs Reviewed  CBC WITH DIFFERENTIAL/PLATELET - Abnormal; Notable for the following components:      Result Value   RBC 3.37 (*)    Hemoglobin 10.2 (*)    HCT 30.8 (*)    RDW 14.9 (*)    All other components within normal limits  COMPREHENSIVE METABOLIC PANEL - Abnormal; Notable for the following components:   BUN 48 (*)    Creatinine, Ser 1.55 (*)    GFR calc non Af Amer 29 (*)    GFR calc Af Amer 33 (*)    All other components within normal limits  LACTIC ACID, PLASMA    Lab work reviewed by me with no acute disease __________________________________________  EKG  ED ECG REPORT I, Merrily BrittleNeil Suzi Hernan, the attending physician, personally viewed and interpreted this ECG.  Date: 08/19/2017 EKG Time:  Rate: 64 Rhythm: normal sinus rhythm QRS Axis: normal Intervals: First-degree AV block ST/T Wave abnormalities: normal Narrative Interpretation: no evidence of acute  ischemia  ____________________________________________  RADIOLOGY   ____________________________________________   PROCEDURES  Procedure(s) performed: no  Procedures  Critical Care performed: no  ____________________________________________   INITIAL IMPRESSION / ASSESSMENT AND PLAN / ED COURSE  Pertinent labs & imaging results that were available during my care of the patient were reviewed by me and considered in my medical decision making (see chart for details).   As part of my medical decision making, I reviewed the following data within the electronic MEDICAL RECORD NUMBER History obtained from family if available, nursing notes, old chart and ekg, as well as notes from prior ED visits.  Patient has strong distal pulses.  Discoloration on her feet wipes obviously with an alcohol swab.  This is most likely discoloration from her new slippers.  A do not believe the patient warrants any further testing at this point and she agrees and would like to go home.  Strict return precautions have been given.      ____________________________________________   FINAL CLINICAL IMPRESSION(S) / ED DIAGNOSES  Final diagnoses:  Lymphedema  Bluish skin discoloration  NEW MEDICATIONS STARTED DURING THIS VISIT:  Discharge Medication List as of 08/17/2017  5:15 PM       Note:  This document was prepared using Dragon voice recognition software and may include unintentional dictation errors.     Merrily Brittle, MD 08/19/17 [phone]

## 2017-08-22 DIAGNOSIS — D638 Anemia in other chronic diseases classified elsewhere: Secondary | ICD-10-CM | POA: Diagnosis not present

## 2017-08-22 DIAGNOSIS — M6281 Muscle weakness (generalized): Secondary | ICD-10-CM | POA: Diagnosis not present

## 2017-08-22 DIAGNOSIS — R2689 Other abnormalities of gait and mobility: Secondary | ICD-10-CM | POA: Diagnosis not present

## 2017-08-22 DIAGNOSIS — R41841 Cognitive communication deficit: Secondary | ICD-10-CM | POA: Diagnosis not present

## 2017-08-22 DIAGNOSIS — I10 Essential (primary) hypertension: Secondary | ICD-10-CM | POA: Diagnosis not present

## 2017-08-22 DIAGNOSIS — I890 Lymphedema, not elsewhere classified: Secondary | ICD-10-CM | POA: Diagnosis not present

## 2017-08-30 DIAGNOSIS — R2689 Other abnormalities of gait and mobility: Secondary | ICD-10-CM | POA: Diagnosis not present

## 2017-08-30 DIAGNOSIS — M6281 Muscle weakness (generalized): Secondary | ICD-10-CM | POA: Diagnosis not present

## 2017-08-30 DIAGNOSIS — I10 Essential (primary) hypertension: Secondary | ICD-10-CM | POA: Diagnosis not present

## 2017-08-30 DIAGNOSIS — I890 Lymphedema, not elsewhere classified: Secondary | ICD-10-CM | POA: Diagnosis not present

## 2017-08-30 DIAGNOSIS — R41841 Cognitive communication deficit: Secondary | ICD-10-CM | POA: Diagnosis not present

## 2017-08-30 DIAGNOSIS — D638 Anemia in other chronic diseases classified elsewhere: Secondary | ICD-10-CM | POA: Diagnosis not present

## 2018-02-15 IMAGING — DX DG TIBIA/FIBULA 2V*L*
3 series · 3 of 3 positions shown · non-contrast
Comparison: None.

CLINICAL DATA: Wound bleeding at the left leg, acute onset.

EXAM:
LEFT TIBIA AND FIBULA - 2 VIEW

[tibia ap]
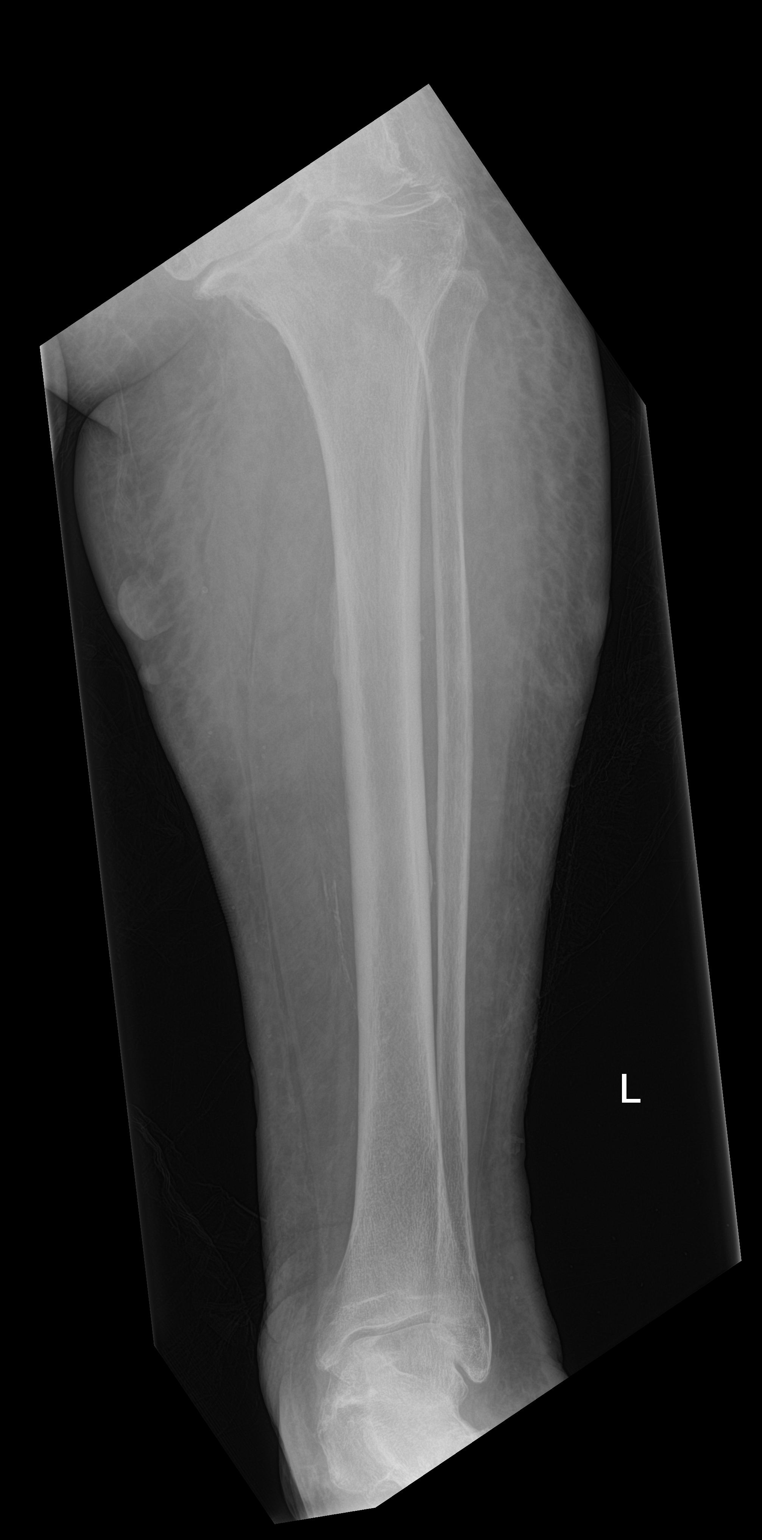

[tibia lat (1 of 2)]
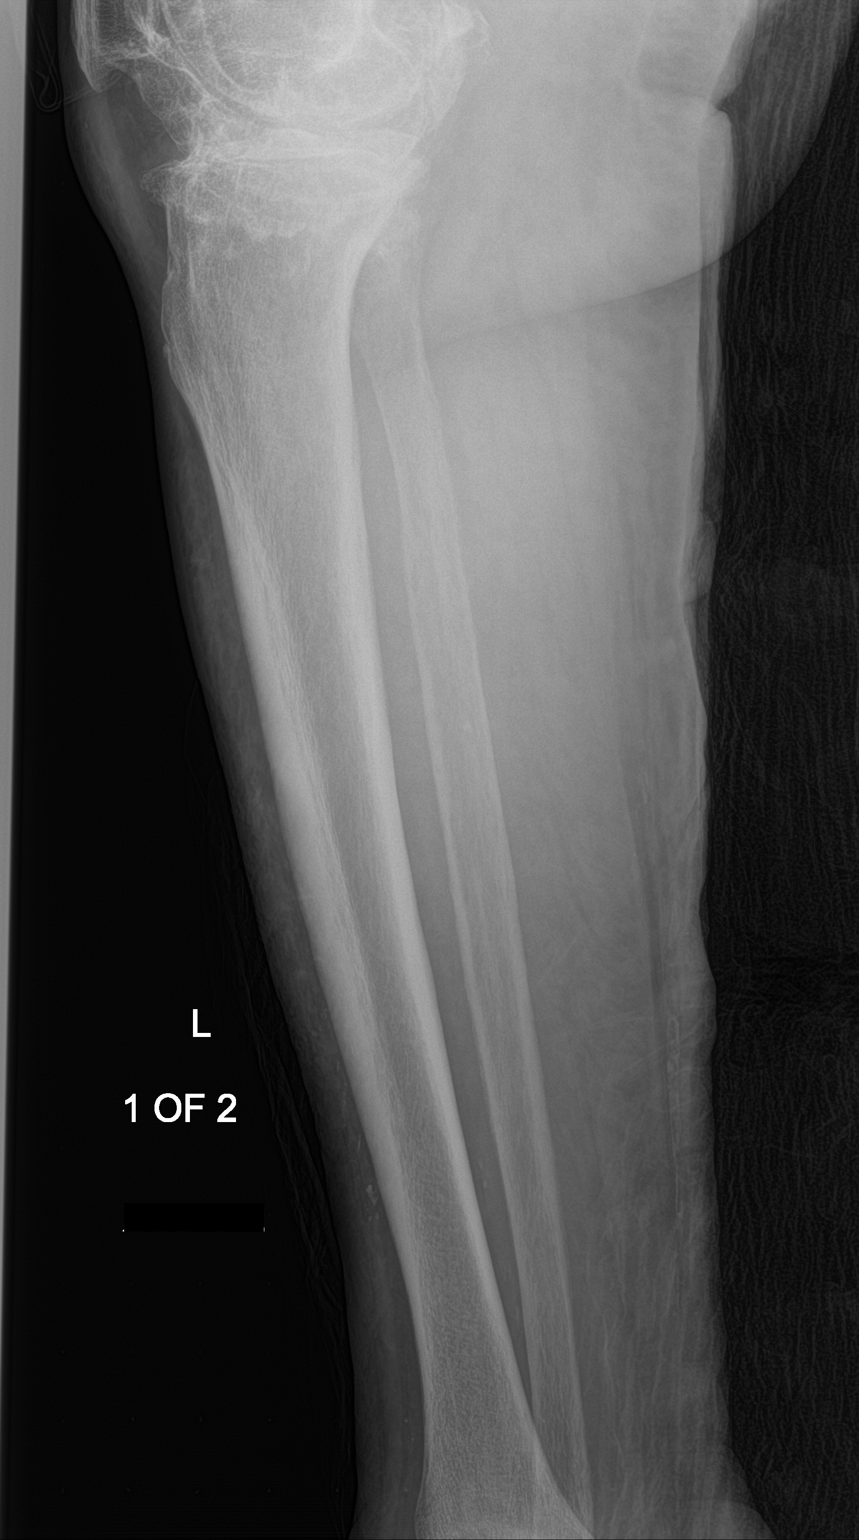

[tibia lat (2 of 2)]
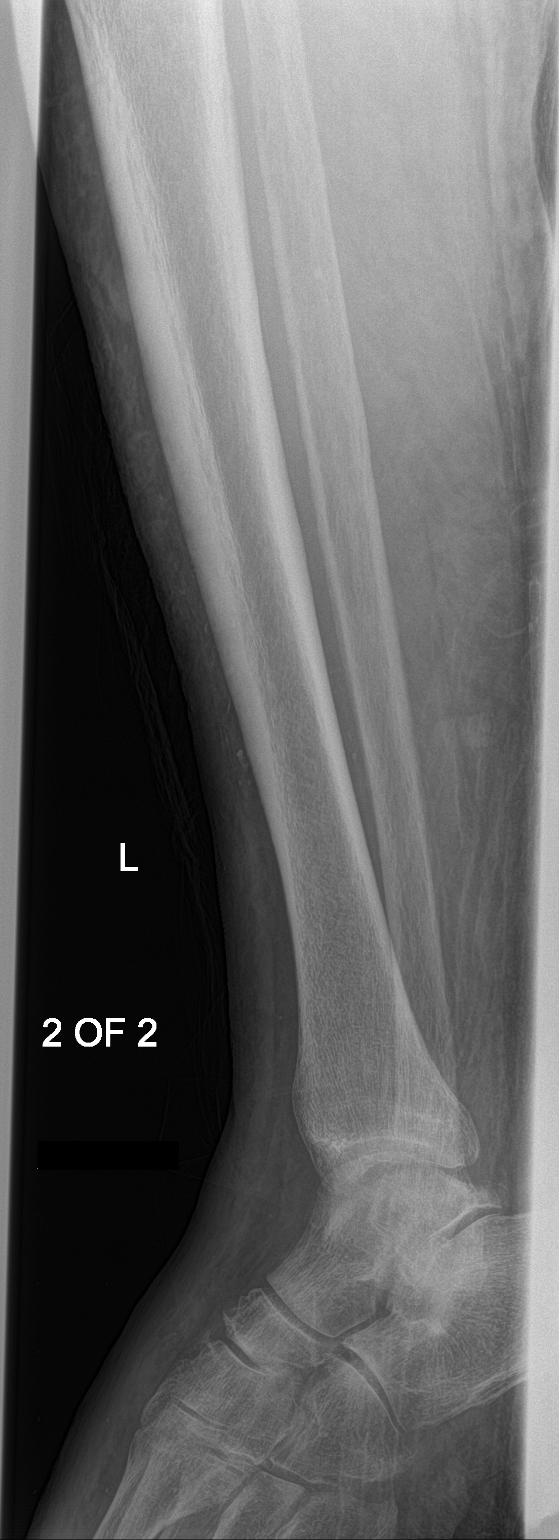

[3 of 3 positions shown; findings below may reference images not displayed]

FINDINGS: The known soft tissue wound is not well characterized on radiograph.
Diffuse soft tissue swelling is noted. No radiopaque foreign bodies
are seen.

There is degenerative change at the left knee, with chronic
depression of the medial tibial plateau and bony remodeling. The
ankle mortise is incompletely assessed, but appears grossly
unremarkable. There is no evidence of fracture or dislocation.
IMPRESSION: Known soft tissue wound is not well characterized on radiograph. No
radiopaque foreign bodies seen.

## 2018-02-15 IMAGING — DX DG CHEST 1V
1 series · 1 of 1 positions shown · non-contrast
Comparison: Chest radiograph performed 11/04/2016

CLINICAL DATA: Acute onset of bleeding from leg wound. Shortness of
breath.

EXAM:
CHEST 1 VIEW

[chest ap]
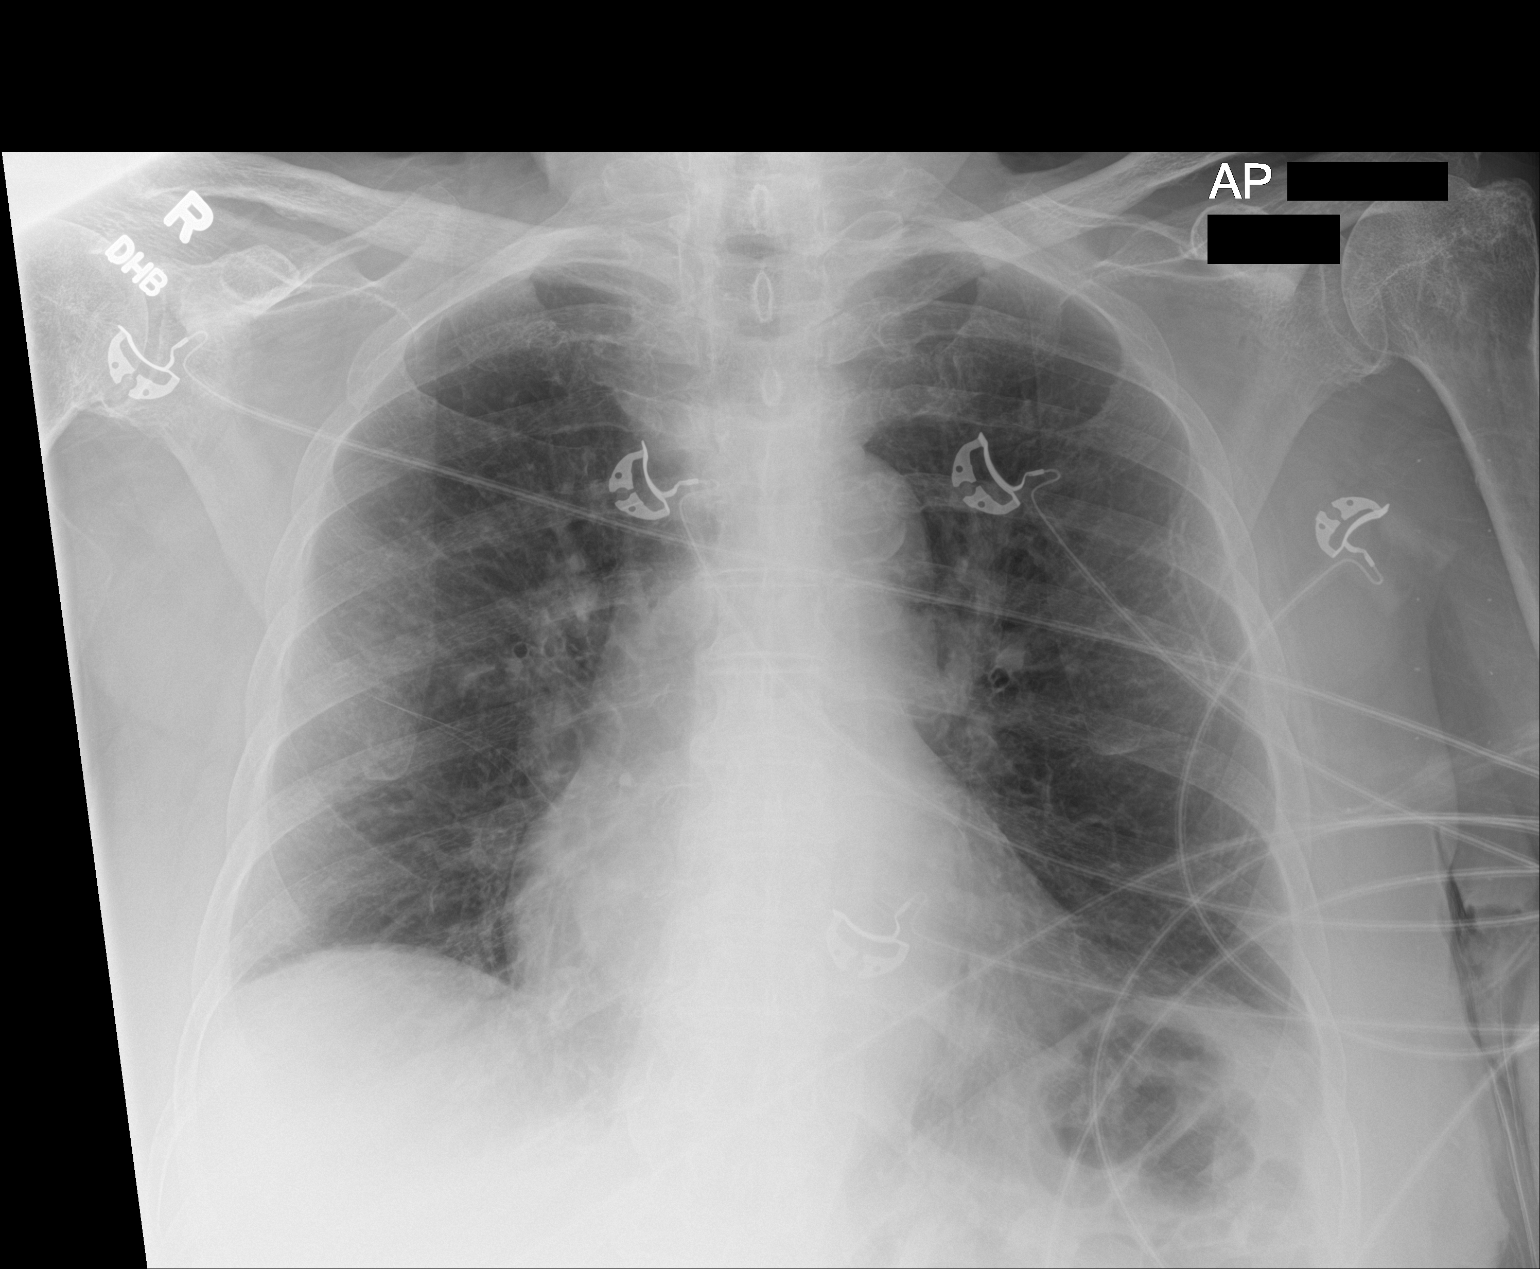

[1 of 1 positions shown; findings below may reference images not displayed]

FINDINGS: The lungs are well-aerated. Mild peribronchial thickening is noted.
There is no evidence of focal opacification, pleural effusion or
pneumothorax.

The cardiomediastinal silhouette is within normal limits. No acute
osseous abnormalities are seen.
IMPRESSION: Mild peribronchial thickening noted.  Lungs otherwise clear.

## 2018-07-27 DIAGNOSIS — R600 Localized edema: Secondary | ICD-10-CM | POA: Diagnosis not present

## 2018-07-27 DIAGNOSIS — I872 Venous insufficiency (chronic) (peripheral): Secondary | ICD-10-CM | POA: Diagnosis not present

## 2018-11-07 ENCOUNTER — Emergency Department: Payer: Medicare HMO

## 2018-11-07 ENCOUNTER — Other Ambulatory Visit: Payer: Self-pay

## 2018-11-07 ENCOUNTER — Emergency Department
Admission: EM | Admit: 2018-11-07 | Discharge: 2018-11-07 | Disposition: A | Discharge status: Home / Self Care | Payer: Medicare HMO | Attending: Emergency Medicine | Admitting: Emergency Medicine

## 2018-11-07 DIAGNOSIS — L03116 Cellulitis of left lower limb: Secondary | ICD-10-CM | POA: Insufficient documentation

## 2018-11-07 DIAGNOSIS — M7989 Other specified soft tissue disorders: Secondary | ICD-10-CM | POA: Diagnosis not present

## 2018-11-07 DIAGNOSIS — Z79899 Other long term (current) drug therapy: Secondary | ICD-10-CM | POA: Diagnosis not present

## 2018-11-07 DIAGNOSIS — L0390 Cellulitis, unspecified: Secondary | ICD-10-CM

## 2018-11-07 DIAGNOSIS — R2242 Localized swelling, mass and lump, left lower limb: Secondary | ICD-10-CM | POA: Diagnosis present

## 2018-11-07 LAB — BASIC METABOLIC PANEL
Anion gap: 9 (ref 5–15)
BUN: 24 mg/dL — ABNORMAL HIGH (ref 8–23)
CO2: 23 mmol/L (ref 22–32)
Calcium: 9.2 mg/dL (ref 8.9–10.3)
Chloride: 107 mmol/L (ref 98–111)
Creatinine, Ser: 1.31 mg/dL — ABNORMAL HIGH (ref 0.44–1.00)
GFR calc Af Amer: 41 mL/min — ABNORMAL LOW (ref >60)
GFR calc non Af Amer: 36 mL/min — ABNORMAL LOW (ref >60)
Glucose, Bld: 95 mg/dL (ref 70–99)
Potassium: 4.3 mmol/L (ref 3.5–5.1)
Sodium: 139 mmol/L (ref 135–145)

## 2018-11-07 LAB — CBC
HCT: 32.2 % — ABNORMAL LOW (ref 36.0–46.0)
Hemoglobin: 9.6 g/dL — ABNORMAL LOW (ref 12.0–15.0)
MCH: 28.9 pg (ref 26.0–34.0)
MCHC: 29.8 g/dL — ABNORMAL LOW (ref 30.0–36.0)
MCV: 97 fL (ref 80.0–100.0)
Platelets: 167 10*3/uL (ref 150–400)
RBC: 3.32 MIL/uL — ABNORMAL LOW (ref 3.87–5.11)
RDW: 14.4 % (ref 11.5–15.5)
WBC: 3.3 10*3/uL — ABNORMAL LOW (ref 4.0–10.5)
nRBC: 0 % (ref 0.0–0.2)

## 2018-11-07 MED ORDER — ACETAMINOPHEN 500 MG PO TABS: 1000.0000 mg | ORAL_TABLET | Freq: Once | ORAL | Status: DC

## 2018-11-07 MED ORDER — DOXYCYCLINE HYCLATE 100 MG PO CAPS: 100.0000 mg | ORAL_CAPSULE | Freq: Two times a day (BID) | ORAL | 0 refills | Status: AC

## 2018-11-07 MED ORDER — VANCOMYCIN HCL IN DEXTROSE 1-5 GM/200ML-% IV SOLN
1000.0000 mg | Freq: Once | INTRAVENOUS | Status: AC
  Administered 2018-11-07: 1000 mg via INTRAVENOUS
  Filled 2018-11-07: qty 200

## 2018-11-07 NOTE — ED Triage Notes (Signed)
Pt reports chronically swelling lower legs but reports wound to left leg and scabbing that has started over last week. Soreness but denies pain. Pt alert and oriented X4, cooperative, RR even and unlabored, color WNL. Pt in NAD.

## 2018-11-07 NOTE — ED Notes (Signed)
Pt son came to nursing station upset about his mother being discharged home with follow up at wound clinic - he stated "that doctor don't know nothing and he is sending her home to die and when she dies I'm going to come back and kill him" Agricultural consultant, Dr Archie Balboa, [employer] officer, and 1st nurse notified and pt son not to be allowed back into room - his number is in chart to be called when she is ready for discharge and he can pull to the front door

## 2018-11-07 NOTE — ED Notes (Addendum)
Pt was taken to restroom (1 assist) and pt's brief/ and green long dress was changed and her personal items (credit card x2) & wet clothes were placed into 2 seperate belongings bag to go home with,. Pt refused to allow staff to change her black long sleeve top (since it was soiled) & stated "im just going home I dont wanna take my top off and wear that gown". Pt was then placed in wheelchair and prepared for discharge

## 2018-11-07 NOTE — ED Notes (Signed)
Pt's son Tanish Sinkler was called and informed that his mother's course of therapy was complete and that she was ready to be discharged. This RN did inform the son that the pt did refuse for [place] to change her soiled black top. Son verbalized understanding without concern and noted that he would be here in 20 minutes to pick up said patient. Son was informed to please call staff and notify us of his arrival via phone once in front of the ER.

## 2018-11-07 NOTE — ED Notes (Addendum)
Pt to c/o "swelling in both legs". Pt has obvious deep swelling to both legs that she reports has been ongoing for over a month. Pt has wound with red and yellow discoloration to leg lower leg with odor coming from leg. Pt denies fevers or chills, reports pain in left leg. Reports living at home alone but has son that helps come and take care of her.  Pt has prescription for antibiotics with her dated for July that she states was given to her to treat the infection in her leg.  Son at bedside.

## 2018-11-07 NOTE — ED Provider Notes (Addendum)
[facility] Emergency Department Provider Note  ____________________________________________   I have reviewed the triage vital signs and the nursing notes.   HISTORY  Chief Complaint Leg Swelling and Wound Check   History limited by: Not Limited   HPI [name] [name] is a 85 y.o. female who presents to the emergency department today because of concerns for left leg swelling and pain.  She states she has had problems with this since July.  She was seen at that time and was put on medications.  It did somewhat improve however for the past 2 months it has been getting worse again.  She states it is gradually worsening.  Came in today because it is simply continuing to get worse.  Patient denies any fevers, nausea or vomiting.  She has not followed up with her physician since she was seen in July.   Records reviewed. Per medical record review patient has a history of cellulitis.   Past Medical History:  Diagnosis Date  . Cellulitis 10/28/2016   bilateral lower extremities.     Patient Active Problem List   Diagnosis Date Noted  . Cellulitis 03/06/2017  . Left leg cellulitis 11/25/2016  . AKI (acute kidney injury) (HCC) 11/05/2016  . Pressure injury of skin 11/05/2016  . Elevated troponin 11/04/2016    Past Surgical History:  Procedure Laterality Date  . ABDOMINAL HYSTERECTOMY    . GANGLION CYST EXCISION      Prior to Admission medications   Medication Sig Start Date End Date Taking? Authorizing Provider  acetaminophen (TYLENOL) 325 MG tablet Take 2 tablets (650 mg total) every 6 (six) hours as needed by mouth for mild pain (or Fever >/= 101). 11/28/16   Ramonita Lab, MD  acidophilus (RISAQUAD) CAPS capsule Take 2 capsules 3 (three) times daily by mouth. 11/28/16   Gouru, Deanna Artis, MD  ascorbic acid (VITAMIN C) 100 MG tablet Take 1 tablet (100 mg total) by mouth daily. 11/07/16   Enedina Finner, MD  ferrous sulfate 325 (65 FE) MG tablet Take 1 tablet (325  mg total) by mouth daily. 11/07/16   Enedina Finner, MD  furosemide (LASIX) 20 MG tablet Take 1 tablet (20 mg total) by mouth daily as needed for edema. Patient taking differently: Take 20 mg by mouth 2 (two) times daily.  11/07/16 11/07/17  Enedina Finner, MD  Multiple Vitamins-Minerals (MULTIVITAL) tablet Take 1 tablet by mouth daily. 11/07/16   Enedina Finner, MD  potassium chloride (K-DUR,KLOR-CON) 10 MEQ tablet Take 10 mEq by mouth daily.    [provider]    Allergies Patient has no known allergies.  Family History  Problem Relation Age of Onset  . Diabetes Mellitus II Mother   . Heart disease Father     Social History Social History   Tobacco Use  . Smoking status: Never Smoker  . Smokeless tobacco: Never Used  Substance Use Topics  . Alcohol use: No  . Drug use: No    Review of Systems Constitutional: No fever/chills Eyes: No visual changes. ENT: No sore throat. Cardiovascular: Denies chest pain. Respiratory: Denies shortness of breath. Gastrointestinal: No abdominal pain.  No nausea, no vomiting.  No diarrhea.   Genitourinary: Negative for dysuria. Musculoskeletal: Positive for left leg swelling and pain.  Skin: Positive for rash to left lower leg.  Neurological: Negative for headaches, focal weakness or numbness.  ____________________________________________   PHYSICAL EXAM:  VITAL SIGNS: ED Triage Vitals [11/07/18 1029]  Enc Vitals Group     BP Marland Kitchen)  171/75     Pulse Rate 74     Resp 18     Temp 98.4 F (36.9 C)     Temp Source Oral     SpO2 100 %     Weight 200 lb (90.7 kg)     Height  (1.702 m)     Head Circumference      Peak Flow      Pain Score 0   Constitutional: Alert and oriented.  Eyes: Conjunctivae are normal.  ENT      Head: Normocephalic and atraumatic.      Nose: No congestion/rhinnorhea.      Mouth/Throat: Mucous membranes are moist.      Neck: No stridor. Hematological/Lymphatic/Immunilogical: No cervical  lymphadenopathy. Cardiovascular: Normal rate, regular rhythm.  No murmurs, rubs, or gallops. Respiratory: Normal respiratory effort without tachypnea nor retractions. Breath sounds are clear and equal bilaterally. No wheezes/rales/rhonchi. Gastrointestinal: Soft and non tender. No rebound. No guarding.  Genitourinary: Deferred Musculoskeletal: Significant bilateral lower extremity edema. Left lower leg with some ulceration. Foul odor to the left leg. Some erythema.  Neurologic:  Normal speech and language. No gross focal neurologic deficits are appreciated.  Skin:  Skin is warm, dry and intact. No rash noted. Psychiatric: Mood and affect are normal. Speech and behavior are normal. Patient exhibits appropriate insight and judgment.  ____________________________________________    LABS (pertinent positives/negatives)  CBC wbc 3.3, hgb 9.6, plt 167 BMP wnl except bun 24, cr 1.31  ____________________________________________   EKG  None  ____________________________________________    RADIOLOGY  Left tib fib No osteomyelitis  ____________________________________________   PROCEDURES  Procedures  ____________________________________________   INITIAL IMPRESSION / ASSESSMENT AND PLAN / ED COURSE  Pertinent labs & imaging results that were available during my care of the patient were reviewed by me and considered in my medical decision making (see chart for details).   Patient presented to the emergency department today because of concerns for left leg swelling and pain.  Does sound like a somewhat chronic issue and has been gradually getting worse over the past 2 months.  On exam she does have significant swelling to her left leg.  There are some skin ulcers there and I do have concern for infection.  X-ray was performed which did not show any evidence of osteomyelitis. Will give patient dose of IV antibiotics here but I do think it is reasonable for patient be discharged  with oral antibiotics.  Will give patient wound care center follow-up information.  Nurse did ask that I go back to talk to the room to discuss plan with patient and son prior to discharge. I had talked to both about the plan earlier. Son expressed concern about patient being discharged rather than being admitted to the hospital. Did again explain plan to start patient on antibiotics and have patient follow up at wound care center. Son expressed frustration at this plan. Did attempt to answer all of patient's son's questions. Did try to explain to son that given chronic nature did not feel patient required acute admission. Additionally at this time do not feel patient is septic from the infection and she had not been on antibiotics for months so she could be trailed on outpatient antibiotics. Did explain that there are risks to admission to the hospital primarily hospital acquired infections. Son continued to express frustration and anger and stormed out of the room. With the son out of the room I had a chance to speak directly to  the patient. She did feel comfortable and was fine with plan to be discharged home on antibiotics. She stated that she felt like it was a good plan. Did discuss with the patient importance of following up with wound care center.  ____________________________________________   FINAL CLINICAL IMPRESSION(S) / ED DIAGNOSES  Final diagnoses:  Cellulitis, unspecified cellulitis site     Note: This dictation was prepared with Dragon dictation. Any transcriptional errors that result from this process are unintentional     Nance Pear, MD 11/07/18 1816    Nance Pear, MD 11/07/18 [phone]

## 2018-11-07 NOTE — Discharge Instructions (Signed)
Please seek medical attention for any high fevers, chest pain, shortness of breath, change in behavior, persistent vomiting, bloody stool or any other new or concerning symptoms.  

## 2018-11-07 NOTE — ED Notes (Signed)
Went to pt room to discuss discharge with pt and pt son - pt reports that her legs are hurting excessively and she is having difficulty ambulating - the pt son verbalized that he does not feel the pt is safe or well enough to go home - pt bilat lower ext are red/swollen and left lower leg has cracking/weeping areas - Dr Archie Balboa notified of son's concerns and is at bedside speaking with him now

## 2018-11-19 ENCOUNTER — Ambulatory Visit: Payer: Medicare HMO | Admitting: Physician Assistant

## 2019-03-19 DIAGNOSIS — G9341 Metabolic encephalopathy: Secondary | ICD-10-CM | POA: Diagnosis not present

## 2019-03-19 DIAGNOSIS — L03115 Cellulitis of right lower limb: Secondary | ICD-10-CM | POA: Diagnosis not present

## 2019-03-19 DIAGNOSIS — R627 Adult failure to thrive: Secondary | ICD-10-CM | POA: Diagnosis not present

## 2019-03-19 DIAGNOSIS — R262 Difficulty in walking, not elsewhere classified: Secondary | ICD-10-CM | POA: Diagnosis not present

## 2019-03-19 DIAGNOSIS — R509 Fever, unspecified: Secondary | ICD-10-CM | POA: Diagnosis not present

## 2019-03-19 DIAGNOSIS — M19072 Primary osteoarthritis, left ankle and foot: Secondary | ICD-10-CM | POA: Diagnosis not present

## 2019-03-19 DIAGNOSIS — K5900 Constipation, unspecified: Secondary | ICD-10-CM | POA: Diagnosis not present

## 2019-03-19 DIAGNOSIS — Z20822 Contact with and (suspected) exposure to covid-19: Secondary | ICD-10-CM | POA: Diagnosis not present

## 2019-03-19 DIAGNOSIS — I890 Lymphedema, not elsewhere classified: Secondary | ICD-10-CM | POA: Diagnosis not present

## 2019-03-19 DIAGNOSIS — R0600 Dyspnea, unspecified: Secondary | ICD-10-CM | POA: Diagnosis not present

## 2019-03-19 DIAGNOSIS — N179 Acute kidney failure, unspecified: Secondary | ICD-10-CM | POA: Diagnosis not present

## 2019-03-19 DIAGNOSIS — L03116 Cellulitis of left lower limb: Secondary | ICD-10-CM | POA: Diagnosis not present

## 2019-03-19 DIAGNOSIS — D649 Anemia, unspecified: Secondary | ICD-10-CM | POA: Diagnosis not present

## 2019-03-19 DIAGNOSIS — B351 Tinea unguium: Secondary | ICD-10-CM | POA: Diagnosis not present

## 2019-03-19 DIAGNOSIS — I5030 Unspecified diastolic (congestive) heart failure: Secondary | ICD-10-CM | POA: Diagnosis not present

## 2019-03-19 DIAGNOSIS — I872 Venous insufficiency (chronic) (peripheral): Secondary | ICD-10-CM | POA: Diagnosis not present

## 2019-03-19 DIAGNOSIS — I87319 Chronic venous hypertension (idiopathic) with ulcer of unspecified lower extremity: Secondary | ICD-10-CM | POA: Diagnosis not present

## 2019-03-19 DIAGNOSIS — I5032 Chronic diastolic (congestive) heart failure: Secondary | ICD-10-CM | POA: Diagnosis not present

## 2019-03-19 DIAGNOSIS — R279 Unspecified lack of coordination: Secondary | ICD-10-CM | POA: Diagnosis not present

## 2019-03-19 DIAGNOSIS — N183 Chronic kidney disease, stage 3 unspecified: Secondary | ICD-10-CM | POA: Diagnosis not present

## 2019-03-19 DIAGNOSIS — Z608 Other problems related to social environment: Secondary | ICD-10-CM | POA: Diagnosis not present

## 2019-03-19 DIAGNOSIS — I340 Nonrheumatic mitral (valve) insufficiency: Secondary | ICD-10-CM | POA: Diagnosis not present

## 2019-03-19 DIAGNOSIS — M6281 Muscle weakness (generalized): Secondary | ICD-10-CM | POA: Diagnosis not present

## 2019-03-19 DIAGNOSIS — N189 Chronic kidney disease, unspecified: Secondary | ICD-10-CM | POA: Diagnosis not present

## 2019-03-19 DIAGNOSIS — R4182 Altered mental status, unspecified: Secondary | ICD-10-CM | POA: Diagnosis not present

## 2019-03-19 DIAGNOSIS — R918 Other nonspecific abnormal finding of lung field: Secondary | ICD-10-CM | POA: Diagnosis not present

## 2019-03-19 DIAGNOSIS — M170 Bilateral primary osteoarthritis of knee: Secondary | ICD-10-CM | POA: Diagnosis not present

## 2019-03-19 DIAGNOSIS — D631 Anemia in chronic kidney disease: Secondary | ICD-10-CM | POA: Diagnosis not present

## 2019-03-19 DIAGNOSIS — M19071 Primary osteoarthritis, right ankle and foot: Secondary | ICD-10-CM | POA: Diagnosis not present

## 2019-03-19 DIAGNOSIS — R5381 Other malaise: Secondary | ICD-10-CM | POA: Diagnosis not present

## 2019-03-19 DIAGNOSIS — M7989 Other specified soft tissue disorders: Secondary | ICD-10-CM | POA: Diagnosis not present

## 2019-03-19 DIAGNOSIS — N390 Urinary tract infection, site not specified: Secondary | ICD-10-CM | POA: Diagnosis not present

## 2019-03-19 DIAGNOSIS — L308 Other specified dermatitis: Secondary | ICD-10-CM | POA: Diagnosis not present

## 2019-03-19 DIAGNOSIS — R600 Localized edema: Secondary | ICD-10-CM | POA: Diagnosis not present

## 2019-03-19 DIAGNOSIS — J90 Pleural effusion, not elsewhere classified: Secondary | ICD-10-CM | POA: Diagnosis not present

## 2019-03-19 DIAGNOSIS — I10 Essential (primary) hypertension: Secondary | ICD-10-CM | POA: Diagnosis not present

## 2019-03-19 DIAGNOSIS — I130 Hypertensive heart and chronic kidney disease with heart failure and stage 1 through stage 4 chronic kidney disease, or unspecified chronic kidney disease: Secondary | ICD-10-CM | POA: Diagnosis not present

## 2019-03-26 DIAGNOSIS — E669 Obesity, unspecified: Secondary | ICD-10-CM | POA: Insufficient documentation

## 2019-03-27 DIAGNOSIS — R279 Unspecified lack of coordination: Secondary | ICD-10-CM | POA: Diagnosis not present

## 2019-03-27 DIAGNOSIS — D649 Anemia, unspecified: Secondary | ICD-10-CM | POA: Diagnosis not present

## 2019-03-27 DIAGNOSIS — R5381 Other malaise: Secondary | ICD-10-CM | POA: Diagnosis not present

## 2019-03-27 DIAGNOSIS — M179 Osteoarthritis of knee, unspecified: Secondary | ICD-10-CM | POA: Diagnosis not present

## 2019-03-27 DIAGNOSIS — L308 Other specified dermatitis: Secondary | ICD-10-CM | POA: Diagnosis not present

## 2019-03-27 DIAGNOSIS — N183 Chronic kidney disease, stage 3 unspecified: Secondary | ICD-10-CM | POA: Diagnosis not present

## 2019-03-27 DIAGNOSIS — N189 Chronic kidney disease, unspecified: Secondary | ICD-10-CM | POA: Diagnosis not present

## 2019-03-27 DIAGNOSIS — I5030 Unspecified diastolic (congestive) heart failure: Secondary | ICD-10-CM | POA: Diagnosis not present

## 2019-03-27 DIAGNOSIS — E876 Hypokalemia: Secondary | ICD-10-CM | POA: Diagnosis not present

## 2019-03-27 DIAGNOSIS — R41841 Cognitive communication deficit: Secondary | ICD-10-CM | POA: Diagnosis not present

## 2019-03-27 DIAGNOSIS — I10 Essential (primary) hypertension: Secondary | ICD-10-CM | POA: Diagnosis not present

## 2019-03-27 DIAGNOSIS — I872 Venous insufficiency (chronic) (peripheral): Secondary | ICD-10-CM | POA: Diagnosis not present

## 2019-03-27 DIAGNOSIS — I890 Lymphedema, not elsewhere classified: Secondary | ICD-10-CM | POA: Diagnosis not present

## 2019-03-27 DIAGNOSIS — I87319 Chronic venous hypertension (idiopathic) with ulcer of unspecified lower extremity: Secondary | ICD-10-CM | POA: Diagnosis not present

## 2019-03-27 DIAGNOSIS — L97909 Non-pressure chronic ulcer of unspecified part of unspecified lower leg with unspecified severity: Secondary | ICD-10-CM | POA: Diagnosis not present

## 2019-03-27 DIAGNOSIS — D638 Anemia in other chronic diseases classified elsewhere: Secondary | ICD-10-CM | POA: Diagnosis not present

## 2019-03-27 DIAGNOSIS — R262 Difficulty in walking, not elsewhere classified: Secondary | ICD-10-CM | POA: Diagnosis not present

## 2019-03-27 DIAGNOSIS — M6281 Muscle weakness (generalized): Secondary | ICD-10-CM | POA: Diagnosis not present

## 2019-03-27 DIAGNOSIS — N179 Acute kidney failure, unspecified: Secondary | ICD-10-CM | POA: Diagnosis not present

## 2019-03-28 DIAGNOSIS — D638 Anemia in other chronic diseases classified elsewhere: Secondary | ICD-10-CM | POA: Diagnosis not present

## 2019-03-28 DIAGNOSIS — M6281 Muscle weakness (generalized): Secondary | ICD-10-CM | POA: Diagnosis not present

## 2019-03-28 DIAGNOSIS — N179 Acute kidney failure, unspecified: Secondary | ICD-10-CM | POA: Diagnosis not present

## 2019-03-28 DIAGNOSIS — I872 Venous insufficiency (chronic) (peripheral): Secondary | ICD-10-CM | POA: Diagnosis not present

## 2019-04-02 DIAGNOSIS — M6281 Muscle weakness (generalized): Secondary | ICD-10-CM | POA: Diagnosis not present

## 2019-04-02 DIAGNOSIS — I890 Lymphedema, not elsewhere classified: Secondary | ICD-10-CM | POA: Diagnosis not present

## 2019-04-02 DIAGNOSIS — R262 Difficulty in walking, not elsewhere classified: Secondary | ICD-10-CM | POA: Diagnosis not present

## 2019-04-02 DIAGNOSIS — R41841 Cognitive communication deficit: Secondary | ICD-10-CM | POA: Diagnosis not present

## 2019-04-03 DIAGNOSIS — D649 Anemia, unspecified: Secondary | ICD-10-CM | POA: Diagnosis not present

## 2019-04-03 DIAGNOSIS — E876 Hypokalemia: Secondary | ICD-10-CM | POA: Diagnosis not present

## 2019-04-03 DIAGNOSIS — I10 Essential (primary) hypertension: Secondary | ICD-10-CM | POA: Diagnosis not present

## 2019-04-03 DIAGNOSIS — M179 Osteoarthritis of knee, unspecified: Secondary | ICD-10-CM | POA: Diagnosis not present

## 2019-05-07 DIAGNOSIS — L97909 Non-pressure chronic ulcer of unspecified part of unspecified lower leg with unspecified severity: Secondary | ICD-10-CM | POA: Diagnosis not present

## 2019-05-07 DIAGNOSIS — N183 Chronic kidney disease, stage 3 unspecified: Secondary | ICD-10-CM | POA: Diagnosis not present

## 2019-05-07 DIAGNOSIS — I87319 Chronic venous hypertension (idiopathic) with ulcer of unspecified lower extremity: Secondary | ICD-10-CM | POA: Diagnosis not present

## 2019-05-07 DIAGNOSIS — I5030 Unspecified diastolic (congestive) heart failure: Secondary | ICD-10-CM | POA: Diagnosis not present

## 2019-05-22 DIAGNOSIS — N183 Chronic kidney disease, stage 3 unspecified: Secondary | ICD-10-CM | POA: Diagnosis not present

## 2019-05-22 DIAGNOSIS — L97909 Non-pressure chronic ulcer of unspecified part of unspecified lower leg with unspecified severity: Secondary | ICD-10-CM | POA: Diagnosis not present

## 2019-05-22 DIAGNOSIS — I5030 Unspecified diastolic (congestive) heart failure: Secondary | ICD-10-CM | POA: Diagnosis not present

## 2019-05-22 DIAGNOSIS — I87319 Chronic venous hypertension (idiopathic) with ulcer of unspecified lower extremity: Secondary | ICD-10-CM | POA: Diagnosis not present

## 2019-05-30 DIAGNOSIS — Z87440 Personal history of urinary (tract) infections: Secondary | ICD-10-CM | POA: Diagnosis not present

## 2019-05-30 DIAGNOSIS — I5030 Unspecified diastolic (congestive) heart failure: Secondary | ICD-10-CM | POA: Diagnosis not present

## 2019-05-30 DIAGNOSIS — R41841 Cognitive communication deficit: Secondary | ICD-10-CM | POA: Diagnosis not present

## 2019-05-30 DIAGNOSIS — R627 Adult failure to thrive: Secondary | ICD-10-CM | POA: Diagnosis not present

## 2019-05-30 DIAGNOSIS — I130 Hypertensive heart and chronic kidney disease with heart failure and stage 1 through stage 4 chronic kidney disease, or unspecified chronic kidney disease: Secondary | ICD-10-CM | POA: Diagnosis not present

## 2019-05-30 DIAGNOSIS — N183 Chronic kidney disease, stage 3 unspecified: Secondary | ICD-10-CM | POA: Diagnosis not present

## 2019-05-30 DIAGNOSIS — I890 Lymphedema, not elsewhere classified: Secondary | ICD-10-CM | POA: Diagnosis not present

## 2019-05-30 DIAGNOSIS — D631 Anemia in chronic kidney disease: Secondary | ICD-10-CM | POA: Diagnosis not present

## 2019-05-30 DIAGNOSIS — I872 Venous insufficiency (chronic) (peripheral): Secondary | ICD-10-CM | POA: Diagnosis not present

## 2019-06-07 DIAGNOSIS — I872 Venous insufficiency (chronic) (peripheral): Secondary | ICD-10-CM | POA: Diagnosis not present

## 2019-06-07 DIAGNOSIS — I890 Lymphedema, not elsewhere classified: Secondary | ICD-10-CM | POA: Diagnosis not present

## 2019-06-07 DIAGNOSIS — N183 Chronic kidney disease, stage 3 unspecified: Secondary | ICD-10-CM | POA: Diagnosis not present

## 2019-06-07 DIAGNOSIS — D631 Anemia in chronic kidney disease: Secondary | ICD-10-CM | POA: Diagnosis not present

## 2019-06-07 DIAGNOSIS — Z87440 Personal history of urinary (tract) infections: Secondary | ICD-10-CM | POA: Diagnosis not present

## 2019-06-07 DIAGNOSIS — I130 Hypertensive heart and chronic kidney disease with heart failure and stage 1 through stage 4 chronic kidney disease, or unspecified chronic kidney disease: Secondary | ICD-10-CM | POA: Diagnosis not present

## 2019-06-07 DIAGNOSIS — R41841 Cognitive communication deficit: Secondary | ICD-10-CM | POA: Diagnosis not present

## 2019-06-07 DIAGNOSIS — I5030 Unspecified diastolic (congestive) heart failure: Secondary | ICD-10-CM | POA: Diagnosis not present

## 2019-06-07 DIAGNOSIS — R627 Adult failure to thrive: Secondary | ICD-10-CM | POA: Diagnosis not present

## 2019-06-11 DIAGNOSIS — R41841 Cognitive communication deficit: Secondary | ICD-10-CM | POA: Diagnosis not present

## 2019-06-11 DIAGNOSIS — I130 Hypertensive heart and chronic kidney disease with heart failure and stage 1 through stage 4 chronic kidney disease, or unspecified chronic kidney disease: Secondary | ICD-10-CM | POA: Diagnosis not present

## 2019-06-11 DIAGNOSIS — I872 Venous insufficiency (chronic) (peripheral): Secondary | ICD-10-CM | POA: Diagnosis not present

## 2019-06-11 DIAGNOSIS — D631 Anemia in chronic kidney disease: Secondary | ICD-10-CM | POA: Diagnosis not present

## 2019-06-11 DIAGNOSIS — Z87440 Personal history of urinary (tract) infections: Secondary | ICD-10-CM | POA: Diagnosis not present

## 2019-06-11 DIAGNOSIS — N183 Chronic kidney disease, stage 3 unspecified: Secondary | ICD-10-CM | POA: Diagnosis not present

## 2019-06-11 DIAGNOSIS — I5030 Unspecified diastolic (congestive) heart failure: Secondary | ICD-10-CM | POA: Diagnosis not present

## 2019-06-11 DIAGNOSIS — I890 Lymphedema, not elsewhere classified: Secondary | ICD-10-CM | POA: Diagnosis not present

## 2019-06-11 DIAGNOSIS — R627 Adult failure to thrive: Secondary | ICD-10-CM | POA: Diagnosis not present

## 2019-07-23 DIAGNOSIS — M79674 Pain in right toe(s): Secondary | ICD-10-CM | POA: Diagnosis not present

## 2019-07-23 DIAGNOSIS — L0390 Cellulitis, unspecified: Secondary | ICD-10-CM | POA: Diagnosis not present

## 2019-07-23 DIAGNOSIS — R41841 Cognitive communication deficit: Secondary | ICD-10-CM | POA: Diagnosis not present

## 2019-07-23 DIAGNOSIS — N183 Chronic kidney disease, stage 3 unspecified: Secondary | ICD-10-CM | POA: Diagnosis not present

## 2019-07-23 DIAGNOSIS — Z993 Dependence on wheelchair: Secondary | ICD-10-CM | POA: Diagnosis not present

## 2019-07-23 DIAGNOSIS — N189 Chronic kidney disease, unspecified: Secondary | ICD-10-CM | POA: Diagnosis not present

## 2019-07-23 DIAGNOSIS — R6889 Other general symptoms and signs: Secondary | ICD-10-CM | POA: Diagnosis not present

## 2019-07-23 DIAGNOSIS — M79675 Pain in left toe(s): Secondary | ICD-10-CM | POA: Diagnosis not present

## 2019-07-23 DIAGNOSIS — I890 Lymphedema, not elsewhere classified: Secondary | ICD-10-CM | POA: Diagnosis not present

## 2019-07-23 DIAGNOSIS — Z0000 Encounter for general adult medical examination without abnormal findings: Secondary | ICD-10-CM | POA: Diagnosis not present

## 2019-07-23 DIAGNOSIS — I129 Hypertensive chronic kidney disease with stage 1 through stage 4 chronic kidney disease, or unspecified chronic kidney disease: Secondary | ICD-10-CM | POA: Diagnosis not present

## 2019-07-23 DIAGNOSIS — R600 Localized edema: Secondary | ICD-10-CM | POA: Diagnosis not present

## 2019-07-23 DIAGNOSIS — B351 Tinea unguium: Secondary | ICD-10-CM | POA: Diagnosis not present

## 2019-07-23 DIAGNOSIS — D649 Anemia, unspecified: Secondary | ICD-10-CM | POA: Diagnosis not present

## 2019-07-23 DIAGNOSIS — I872 Venous insufficiency (chronic) (peripheral): Secondary | ICD-10-CM | POA: Diagnosis not present

## 2019-07-23 DIAGNOSIS — I130 Hypertensive heart and chronic kidney disease with heart failure and stage 1 through stage 4 chronic kidney disease, or unspecified chronic kidney disease: Secondary | ICD-10-CM | POA: Diagnosis not present

## 2019-07-23 DIAGNOSIS — I5030 Unspecified diastolic (congestive) heart failure: Secondary | ICD-10-CM | POA: Diagnosis not present

## 2019-07-23 DIAGNOSIS — I10 Essential (primary) hypertension: Secondary | ICD-10-CM | POA: Diagnosis not present

## 2019-07-23 DIAGNOSIS — D631 Anemia in chronic kidney disease: Secondary | ICD-10-CM | POA: Diagnosis not present

## 2019-08-01 DIAGNOSIS — M1990 Unspecified osteoarthritis, unspecified site: Secondary | ICD-10-CM | POA: Diagnosis not present

## 2019-08-01 DIAGNOSIS — I890 Lymphedema, not elsewhere classified: Secondary | ICD-10-CM | POA: Diagnosis not present

## 2019-08-01 DIAGNOSIS — J309 Allergic rhinitis, unspecified: Secondary | ICD-10-CM | POA: Diagnosis not present

## 2019-08-01 DIAGNOSIS — I872 Venous insufficiency (chronic) (peripheral): Secondary | ICD-10-CM | POA: Diagnosis not present

## 2019-08-01 DIAGNOSIS — D631 Anemia in chronic kidney disease: Secondary | ICD-10-CM | POA: Diagnosis not present

## 2019-08-01 DIAGNOSIS — H25019 Cortical age-related cataract, unspecified eye: Secondary | ICD-10-CM | POA: Diagnosis not present

## 2019-08-01 DIAGNOSIS — I129 Hypertensive chronic kidney disease with stage 1 through stage 4 chronic kidney disease, or unspecified chronic kidney disease: Secondary | ICD-10-CM | POA: Diagnosis not present

## 2019-08-01 DIAGNOSIS — N1831 Chronic kidney disease, stage 3a: Secondary | ICD-10-CM | POA: Diagnosis not present

## 2019-08-05 DIAGNOSIS — I129 Hypertensive chronic kidney disease with stage 1 through stage 4 chronic kidney disease, or unspecified chronic kidney disease: Secondary | ICD-10-CM | POA: Diagnosis not present

## 2019-08-05 DIAGNOSIS — N1831 Chronic kidney disease, stage 3a: Secondary | ICD-10-CM | POA: Diagnosis not present

## 2019-08-05 DIAGNOSIS — D631 Anemia in chronic kidney disease: Secondary | ICD-10-CM | POA: Diagnosis not present

## 2019-08-05 DIAGNOSIS — J309 Allergic rhinitis, unspecified: Secondary | ICD-10-CM | POA: Diagnosis not present

## 2019-08-05 DIAGNOSIS — I890 Lymphedema, not elsewhere classified: Secondary | ICD-10-CM | POA: Diagnosis not present

## 2019-08-05 DIAGNOSIS — I872 Venous insufficiency (chronic) (peripheral): Secondary | ICD-10-CM | POA: Diagnosis not present

## 2019-08-05 DIAGNOSIS — M1990 Unspecified osteoarthritis, unspecified site: Secondary | ICD-10-CM | POA: Diagnosis not present

## 2019-08-05 DIAGNOSIS — H25019 Cortical age-related cataract, unspecified eye: Secondary | ICD-10-CM | POA: Diagnosis not present

## 2019-08-07 DIAGNOSIS — N1831 Chronic kidney disease, stage 3a: Secondary | ICD-10-CM | POA: Diagnosis not present

## 2019-08-07 DIAGNOSIS — I129 Hypertensive chronic kidney disease with stage 1 through stage 4 chronic kidney disease, or unspecified chronic kidney disease: Secondary | ICD-10-CM | POA: Diagnosis not present

## 2019-08-07 DIAGNOSIS — D631 Anemia in chronic kidney disease: Secondary | ICD-10-CM | POA: Diagnosis not present

## 2019-08-07 DIAGNOSIS — I890 Lymphedema, not elsewhere classified: Secondary | ICD-10-CM | POA: Diagnosis not present

## 2019-08-07 DIAGNOSIS — J309 Allergic rhinitis, unspecified: Secondary | ICD-10-CM | POA: Diagnosis not present

## 2019-08-07 DIAGNOSIS — I872 Venous insufficiency (chronic) (peripheral): Secondary | ICD-10-CM | POA: Diagnosis not present

## 2019-08-07 DIAGNOSIS — M1990 Unspecified osteoarthritis, unspecified site: Secondary | ICD-10-CM | POA: Diagnosis not present

## 2019-08-07 DIAGNOSIS — H25019 Cortical age-related cataract, unspecified eye: Secondary | ICD-10-CM | POA: Diagnosis not present

## 2019-08-08 DIAGNOSIS — D631 Anemia in chronic kidney disease: Secondary | ICD-10-CM | POA: Diagnosis not present

## 2019-08-08 DIAGNOSIS — I872 Venous insufficiency (chronic) (peripheral): Secondary | ICD-10-CM | POA: Diagnosis not present

## 2019-08-08 DIAGNOSIS — I129 Hypertensive chronic kidney disease with stage 1 through stage 4 chronic kidney disease, or unspecified chronic kidney disease: Secondary | ICD-10-CM | POA: Diagnosis not present

## 2019-08-12 DIAGNOSIS — N1831 Chronic kidney disease, stage 3a: Secondary | ICD-10-CM | POA: Diagnosis not present

## 2019-08-12 DIAGNOSIS — H25019 Cortical age-related cataract, unspecified eye: Secondary | ICD-10-CM | POA: Diagnosis not present

## 2019-08-12 DIAGNOSIS — J309 Allergic rhinitis, unspecified: Secondary | ICD-10-CM | POA: Diagnosis not present

## 2019-08-12 DIAGNOSIS — I129 Hypertensive chronic kidney disease with stage 1 through stage 4 chronic kidney disease, or unspecified chronic kidney disease: Secondary | ICD-10-CM | POA: Diagnosis not present

## 2019-08-12 DIAGNOSIS — M1990 Unspecified osteoarthritis, unspecified site: Secondary | ICD-10-CM | POA: Diagnosis not present

## 2019-08-12 DIAGNOSIS — I890 Lymphedema, not elsewhere classified: Secondary | ICD-10-CM | POA: Diagnosis not present

## 2019-08-12 DIAGNOSIS — I872 Venous insufficiency (chronic) (peripheral): Secondary | ICD-10-CM | POA: Diagnosis not present

## 2019-08-12 DIAGNOSIS — D631 Anemia in chronic kidney disease: Secondary | ICD-10-CM | POA: Diagnosis not present

## 2019-08-13 DIAGNOSIS — I890 Lymphedema, not elsewhere classified: Secondary | ICD-10-CM | POA: Diagnosis not present

## 2019-08-13 DIAGNOSIS — N1831 Chronic kidney disease, stage 3a: Secondary | ICD-10-CM | POA: Diagnosis not present

## 2019-08-13 DIAGNOSIS — I129 Hypertensive chronic kidney disease with stage 1 through stage 4 chronic kidney disease, or unspecified chronic kidney disease: Secondary | ICD-10-CM | POA: Diagnosis not present

## 2019-08-13 DIAGNOSIS — I872 Venous insufficiency (chronic) (peripheral): Secondary | ICD-10-CM | POA: Diagnosis not present

## 2019-08-13 DIAGNOSIS — H25019 Cortical age-related cataract, unspecified eye: Secondary | ICD-10-CM | POA: Diagnosis not present

## 2019-08-13 DIAGNOSIS — M1990 Unspecified osteoarthritis, unspecified site: Secondary | ICD-10-CM | POA: Diagnosis not present

## 2019-08-13 DIAGNOSIS — J309 Allergic rhinitis, unspecified: Secondary | ICD-10-CM | POA: Diagnosis not present

## 2019-08-13 DIAGNOSIS — D631 Anemia in chronic kidney disease: Secondary | ICD-10-CM | POA: Diagnosis not present

## 2019-08-14 DIAGNOSIS — I890 Lymphedema, not elsewhere classified: Secondary | ICD-10-CM | POA: Diagnosis not present

## 2019-08-14 DIAGNOSIS — M1990 Unspecified osteoarthritis, unspecified site: Secondary | ICD-10-CM | POA: Diagnosis not present

## 2019-08-14 DIAGNOSIS — N1831 Chronic kidney disease, stage 3a: Secondary | ICD-10-CM | POA: Diagnosis not present

## 2019-08-14 DIAGNOSIS — J309 Allergic rhinitis, unspecified: Secondary | ICD-10-CM | POA: Diagnosis not present

## 2019-08-14 DIAGNOSIS — I129 Hypertensive chronic kidney disease with stage 1 through stage 4 chronic kidney disease, or unspecified chronic kidney disease: Secondary | ICD-10-CM | POA: Diagnosis not present

## 2019-08-14 DIAGNOSIS — D631 Anemia in chronic kidney disease: Secondary | ICD-10-CM | POA: Diagnosis not present

## 2019-08-14 DIAGNOSIS — H25019 Cortical age-related cataract, unspecified eye: Secondary | ICD-10-CM | POA: Diagnosis not present

## 2019-08-14 DIAGNOSIS — I872 Venous insufficiency (chronic) (peripheral): Secondary | ICD-10-CM | POA: Diagnosis not present

## 2019-08-15 DIAGNOSIS — I872 Venous insufficiency (chronic) (peripheral): Secondary | ICD-10-CM | POA: Diagnosis not present

## 2019-08-15 DIAGNOSIS — I890 Lymphedema, not elsewhere classified: Secondary | ICD-10-CM | POA: Diagnosis not present

## 2019-08-15 DIAGNOSIS — I129 Hypertensive chronic kidney disease with stage 1 through stage 4 chronic kidney disease, or unspecified chronic kidney disease: Secondary | ICD-10-CM | POA: Diagnosis not present

## 2019-08-15 DIAGNOSIS — H25019 Cortical age-related cataract, unspecified eye: Secondary | ICD-10-CM | POA: Diagnosis not present

## 2019-08-15 DIAGNOSIS — J309 Allergic rhinitis, unspecified: Secondary | ICD-10-CM | POA: Diagnosis not present

## 2019-08-15 DIAGNOSIS — N1831 Chronic kidney disease, stage 3a: Secondary | ICD-10-CM | POA: Diagnosis not present

## 2019-08-15 DIAGNOSIS — D631 Anemia in chronic kidney disease: Secondary | ICD-10-CM | POA: Diagnosis not present

## 2019-08-15 DIAGNOSIS — M1990 Unspecified osteoarthritis, unspecified site: Secondary | ICD-10-CM | POA: Diagnosis not present

## 2019-08-17 DIAGNOSIS — M1990 Unspecified osteoarthritis, unspecified site: Secondary | ICD-10-CM | POA: Diagnosis not present

## 2019-08-17 DIAGNOSIS — D631 Anemia in chronic kidney disease: Secondary | ICD-10-CM | POA: Diagnosis not present

## 2019-08-17 DIAGNOSIS — I872 Venous insufficiency (chronic) (peripheral): Secondary | ICD-10-CM | POA: Diagnosis not present

## 2019-08-17 DIAGNOSIS — H25019 Cortical age-related cataract, unspecified eye: Secondary | ICD-10-CM | POA: Diagnosis not present

## 2019-08-17 DIAGNOSIS — I890 Lymphedema, not elsewhere classified: Secondary | ICD-10-CM | POA: Diagnosis not present

## 2019-08-17 DIAGNOSIS — N1831 Chronic kidney disease, stage 3a: Secondary | ICD-10-CM | POA: Diagnosis not present

## 2019-08-17 DIAGNOSIS — I129 Hypertensive chronic kidney disease with stage 1 through stage 4 chronic kidney disease, or unspecified chronic kidney disease: Secondary | ICD-10-CM | POA: Diagnosis not present

## 2019-08-17 DIAGNOSIS — J309 Allergic rhinitis, unspecified: Secondary | ICD-10-CM | POA: Diagnosis not present

## 2019-08-19 DIAGNOSIS — D631 Anemia in chronic kidney disease: Secondary | ICD-10-CM | POA: Diagnosis not present

## 2019-08-19 DIAGNOSIS — I872 Venous insufficiency (chronic) (peripheral): Secondary | ICD-10-CM | POA: Diagnosis not present

## 2019-08-19 DIAGNOSIS — I129 Hypertensive chronic kidney disease with stage 1 through stage 4 chronic kidney disease, or unspecified chronic kidney disease: Secondary | ICD-10-CM | POA: Diagnosis not present

## 2019-08-19 DIAGNOSIS — I890 Lymphedema, not elsewhere classified: Secondary | ICD-10-CM | POA: Diagnosis not present

## 2019-08-19 DIAGNOSIS — J309 Allergic rhinitis, unspecified: Secondary | ICD-10-CM | POA: Diagnosis not present

## 2019-08-19 DIAGNOSIS — M1990 Unspecified osteoarthritis, unspecified site: Secondary | ICD-10-CM | POA: Diagnosis not present

## 2019-08-19 DIAGNOSIS — N1831 Chronic kidney disease, stage 3a: Secondary | ICD-10-CM | POA: Diagnosis not present

## 2019-08-19 DIAGNOSIS — H25019 Cortical age-related cataract, unspecified eye: Secondary | ICD-10-CM | POA: Diagnosis not present

## 2019-08-20 DIAGNOSIS — D631 Anemia in chronic kidney disease: Secondary | ICD-10-CM | POA: Diagnosis not present

## 2019-08-20 DIAGNOSIS — M1990 Unspecified osteoarthritis, unspecified site: Secondary | ICD-10-CM | POA: Diagnosis not present

## 2019-08-20 DIAGNOSIS — I129 Hypertensive chronic kidney disease with stage 1 through stage 4 chronic kidney disease, or unspecified chronic kidney disease: Secondary | ICD-10-CM | POA: Diagnosis not present

## 2019-08-20 DIAGNOSIS — H25019 Cortical age-related cataract, unspecified eye: Secondary | ICD-10-CM | POA: Diagnosis not present

## 2019-08-20 DIAGNOSIS — I890 Lymphedema, not elsewhere classified: Secondary | ICD-10-CM | POA: Diagnosis not present

## 2019-08-20 DIAGNOSIS — N1831 Chronic kidney disease, stage 3a: Secondary | ICD-10-CM | POA: Diagnosis not present

## 2019-08-20 DIAGNOSIS — I872 Venous insufficiency (chronic) (peripheral): Secondary | ICD-10-CM | POA: Diagnosis not present

## 2019-08-20 DIAGNOSIS — J309 Allergic rhinitis, unspecified: Secondary | ICD-10-CM | POA: Diagnosis not present

## 2019-08-21 DIAGNOSIS — I872 Venous insufficiency (chronic) (peripheral): Secondary | ICD-10-CM | POA: Diagnosis not present

## 2019-08-21 DIAGNOSIS — H25019 Cortical age-related cataract, unspecified eye: Secondary | ICD-10-CM | POA: Diagnosis not present

## 2019-08-21 DIAGNOSIS — I129 Hypertensive chronic kidney disease with stage 1 through stage 4 chronic kidney disease, or unspecified chronic kidney disease: Secondary | ICD-10-CM | POA: Diagnosis not present

## 2019-08-21 DIAGNOSIS — D631 Anemia in chronic kidney disease: Secondary | ICD-10-CM | POA: Diagnosis not present

## 2019-08-21 DIAGNOSIS — M1990 Unspecified osteoarthritis, unspecified site: Secondary | ICD-10-CM | POA: Diagnosis not present

## 2019-08-21 DIAGNOSIS — N1831 Chronic kidney disease, stage 3a: Secondary | ICD-10-CM | POA: Diagnosis not present

## 2019-08-21 DIAGNOSIS — I890 Lymphedema, not elsewhere classified: Secondary | ICD-10-CM | POA: Diagnosis not present

## 2019-08-21 DIAGNOSIS — J309 Allergic rhinitis, unspecified: Secondary | ICD-10-CM | POA: Diagnosis not present

## 2019-08-22 DIAGNOSIS — D631 Anemia in chronic kidney disease: Secondary | ICD-10-CM | POA: Diagnosis not present

## 2019-08-22 DIAGNOSIS — I890 Lymphedema, not elsewhere classified: Secondary | ICD-10-CM | POA: Diagnosis not present

## 2019-08-22 DIAGNOSIS — M1990 Unspecified osteoarthritis, unspecified site: Secondary | ICD-10-CM | POA: Diagnosis not present

## 2019-08-22 DIAGNOSIS — I129 Hypertensive chronic kidney disease with stage 1 through stage 4 chronic kidney disease, or unspecified chronic kidney disease: Secondary | ICD-10-CM | POA: Diagnosis not present

## 2019-08-22 DIAGNOSIS — I872 Venous insufficiency (chronic) (peripheral): Secondary | ICD-10-CM | POA: Diagnosis not present

## 2019-08-22 DIAGNOSIS — J309 Allergic rhinitis, unspecified: Secondary | ICD-10-CM | POA: Diagnosis not present

## 2019-08-22 DIAGNOSIS — N1831 Chronic kidney disease, stage 3a: Secondary | ICD-10-CM | POA: Diagnosis not present

## 2019-08-22 DIAGNOSIS — H25019 Cortical age-related cataract, unspecified eye: Secondary | ICD-10-CM | POA: Diagnosis not present

## 2019-08-23 DIAGNOSIS — I129 Hypertensive chronic kidney disease with stage 1 through stage 4 chronic kidney disease, or unspecified chronic kidney disease: Secondary | ICD-10-CM | POA: Diagnosis not present

## 2019-08-23 DIAGNOSIS — I872 Venous insufficiency (chronic) (peripheral): Secondary | ICD-10-CM | POA: Diagnosis not present

## 2019-08-23 DIAGNOSIS — D631 Anemia in chronic kidney disease: Secondary | ICD-10-CM | POA: Diagnosis not present

## 2019-08-23 DIAGNOSIS — J309 Allergic rhinitis, unspecified: Secondary | ICD-10-CM | POA: Diagnosis not present

## 2019-08-23 DIAGNOSIS — N1831 Chronic kidney disease, stage 3a: Secondary | ICD-10-CM | POA: Diagnosis not present

## 2019-08-23 DIAGNOSIS — H25019 Cortical age-related cataract, unspecified eye: Secondary | ICD-10-CM | POA: Diagnosis not present

## 2019-08-23 DIAGNOSIS — I890 Lymphedema, not elsewhere classified: Secondary | ICD-10-CM | POA: Diagnosis not present

## 2019-08-23 DIAGNOSIS — M1990 Unspecified osteoarthritis, unspecified site: Secondary | ICD-10-CM | POA: Diagnosis not present

## 2019-08-27 DIAGNOSIS — I872 Venous insufficiency (chronic) (peripheral): Secondary | ICD-10-CM | POA: Diagnosis not present

## 2019-08-27 DIAGNOSIS — I890 Lymphedema, not elsewhere classified: Secondary | ICD-10-CM | POA: Diagnosis not present

## 2019-08-27 DIAGNOSIS — J309 Allergic rhinitis, unspecified: Secondary | ICD-10-CM | POA: Diagnosis not present

## 2019-08-27 DIAGNOSIS — H25019 Cortical age-related cataract, unspecified eye: Secondary | ICD-10-CM | POA: Diagnosis not present

## 2019-08-27 DIAGNOSIS — D631 Anemia in chronic kidney disease: Secondary | ICD-10-CM | POA: Diagnosis not present

## 2019-08-27 DIAGNOSIS — I129 Hypertensive chronic kidney disease with stage 1 through stage 4 chronic kidney disease, or unspecified chronic kidney disease: Secondary | ICD-10-CM | POA: Diagnosis not present

## 2019-08-27 DIAGNOSIS — M1990 Unspecified osteoarthritis, unspecified site: Secondary | ICD-10-CM | POA: Diagnosis not present

## 2019-08-27 DIAGNOSIS — N1831 Chronic kidney disease, stage 3a: Secondary | ICD-10-CM | POA: Diagnosis not present

## 2019-08-29 DIAGNOSIS — I890 Lymphedema, not elsewhere classified: Secondary | ICD-10-CM | POA: Diagnosis not present

## 2019-08-29 DIAGNOSIS — I872 Venous insufficiency (chronic) (peripheral): Secondary | ICD-10-CM | POA: Diagnosis not present

## 2019-08-29 DIAGNOSIS — H25019 Cortical age-related cataract, unspecified eye: Secondary | ICD-10-CM | POA: Diagnosis not present

## 2019-08-29 DIAGNOSIS — D631 Anemia in chronic kidney disease: Secondary | ICD-10-CM | POA: Diagnosis not present

## 2019-08-29 DIAGNOSIS — J309 Allergic rhinitis, unspecified: Secondary | ICD-10-CM | POA: Diagnosis not present

## 2019-08-29 DIAGNOSIS — M1990 Unspecified osteoarthritis, unspecified site: Secondary | ICD-10-CM | POA: Diagnosis not present

## 2019-08-29 DIAGNOSIS — N1831 Chronic kidney disease, stage 3a: Secondary | ICD-10-CM | POA: Diagnosis not present

## 2019-08-29 DIAGNOSIS — I129 Hypertensive chronic kidney disease with stage 1 through stage 4 chronic kidney disease, or unspecified chronic kidney disease: Secondary | ICD-10-CM | POA: Diagnosis not present

## 2019-08-30 DIAGNOSIS — I129 Hypertensive chronic kidney disease with stage 1 through stage 4 chronic kidney disease, or unspecified chronic kidney disease: Secondary | ICD-10-CM | POA: Diagnosis not present

## 2019-08-30 DIAGNOSIS — N1831 Chronic kidney disease, stage 3a: Secondary | ICD-10-CM | POA: Diagnosis not present

## 2019-08-30 DIAGNOSIS — M1990 Unspecified osteoarthritis, unspecified site: Secondary | ICD-10-CM | POA: Diagnosis not present

## 2019-08-30 DIAGNOSIS — D631 Anemia in chronic kidney disease: Secondary | ICD-10-CM | POA: Diagnosis not present

## 2019-08-30 DIAGNOSIS — I872 Venous insufficiency (chronic) (peripheral): Secondary | ICD-10-CM | POA: Diagnosis not present

## 2019-08-30 DIAGNOSIS — H25019 Cortical age-related cataract, unspecified eye: Secondary | ICD-10-CM | POA: Diagnosis not present

## 2019-08-30 DIAGNOSIS — J309 Allergic rhinitis, unspecified: Secondary | ICD-10-CM | POA: Diagnosis not present

## 2019-08-30 DIAGNOSIS — I890 Lymphedema, not elsewhere classified: Secondary | ICD-10-CM | POA: Diagnosis not present

## 2019-09-02 DIAGNOSIS — J309 Allergic rhinitis, unspecified: Secondary | ICD-10-CM | POA: Diagnosis not present

## 2019-09-02 DIAGNOSIS — I129 Hypertensive chronic kidney disease with stage 1 through stage 4 chronic kidney disease, or unspecified chronic kidney disease: Secondary | ICD-10-CM | POA: Diagnosis not present

## 2019-09-02 DIAGNOSIS — I872 Venous insufficiency (chronic) (peripheral): Secondary | ICD-10-CM | POA: Diagnosis not present

## 2019-09-02 DIAGNOSIS — I890 Lymphedema, not elsewhere classified: Secondary | ICD-10-CM | POA: Diagnosis not present

## 2019-09-02 DIAGNOSIS — M1990 Unspecified osteoarthritis, unspecified site: Secondary | ICD-10-CM | POA: Diagnosis not present

## 2019-09-02 DIAGNOSIS — N1831 Chronic kidney disease, stage 3a: Secondary | ICD-10-CM | POA: Diagnosis not present

## 2019-09-02 DIAGNOSIS — D631 Anemia in chronic kidney disease: Secondary | ICD-10-CM | POA: Diagnosis not present

## 2019-09-02 DIAGNOSIS — H25019 Cortical age-related cataract, unspecified eye: Secondary | ICD-10-CM | POA: Diagnosis not present

## 2019-09-04 DIAGNOSIS — H25019 Cortical age-related cataract, unspecified eye: Secondary | ICD-10-CM | POA: Diagnosis not present

## 2019-09-04 DIAGNOSIS — I890 Lymphedema, not elsewhere classified: Secondary | ICD-10-CM | POA: Diagnosis not present

## 2019-09-04 DIAGNOSIS — I129 Hypertensive chronic kidney disease with stage 1 through stage 4 chronic kidney disease, or unspecified chronic kidney disease: Secondary | ICD-10-CM | POA: Diagnosis not present

## 2019-09-04 DIAGNOSIS — M1990 Unspecified osteoarthritis, unspecified site: Secondary | ICD-10-CM | POA: Diagnosis not present

## 2019-09-04 DIAGNOSIS — J309 Allergic rhinitis, unspecified: Secondary | ICD-10-CM | POA: Diagnosis not present

## 2019-09-04 DIAGNOSIS — I872 Venous insufficiency (chronic) (peripheral): Secondary | ICD-10-CM | POA: Diagnosis not present

## 2019-09-04 DIAGNOSIS — D631 Anemia in chronic kidney disease: Secondary | ICD-10-CM | POA: Diagnosis not present

## 2019-09-04 DIAGNOSIS — N1831 Chronic kidney disease, stage 3a: Secondary | ICD-10-CM | POA: Diagnosis not present

## 2019-09-05 DIAGNOSIS — M1990 Unspecified osteoarthritis, unspecified site: Secondary | ICD-10-CM | POA: Diagnosis not present

## 2019-09-05 DIAGNOSIS — I872 Venous insufficiency (chronic) (peripheral): Secondary | ICD-10-CM | POA: Diagnosis not present

## 2019-09-05 DIAGNOSIS — H25019 Cortical age-related cataract, unspecified eye: Secondary | ICD-10-CM | POA: Diagnosis not present

## 2019-09-05 DIAGNOSIS — N1831 Chronic kidney disease, stage 3a: Secondary | ICD-10-CM | POA: Diagnosis not present

## 2019-09-05 DIAGNOSIS — J309 Allergic rhinitis, unspecified: Secondary | ICD-10-CM | POA: Diagnosis not present

## 2019-09-05 DIAGNOSIS — I890 Lymphedema, not elsewhere classified: Secondary | ICD-10-CM | POA: Diagnosis not present

## 2019-09-05 DIAGNOSIS — I129 Hypertensive chronic kidney disease with stage 1 through stage 4 chronic kidney disease, or unspecified chronic kidney disease: Secondary | ICD-10-CM | POA: Diagnosis not present

## 2019-09-05 DIAGNOSIS — D631 Anemia in chronic kidney disease: Secondary | ICD-10-CM | POA: Diagnosis not present

## 2019-09-08 DIAGNOSIS — D631 Anemia in chronic kidney disease: Secondary | ICD-10-CM | POA: Diagnosis not present

## 2019-09-08 DIAGNOSIS — I872 Venous insufficiency (chronic) (peripheral): Secondary | ICD-10-CM | POA: Diagnosis not present

## 2019-09-08 DIAGNOSIS — I129 Hypertensive chronic kidney disease with stage 1 through stage 4 chronic kidney disease, or unspecified chronic kidney disease: Secondary | ICD-10-CM | POA: Diagnosis not present

## 2019-09-09 DIAGNOSIS — D631 Anemia in chronic kidney disease: Secondary | ICD-10-CM | POA: Diagnosis not present

## 2019-09-09 DIAGNOSIS — H25019 Cortical age-related cataract, unspecified eye: Secondary | ICD-10-CM | POA: Diagnosis not present

## 2019-09-09 DIAGNOSIS — N1831 Chronic kidney disease, stage 3a: Secondary | ICD-10-CM | POA: Diagnosis not present

## 2019-09-09 DIAGNOSIS — I872 Venous insufficiency (chronic) (peripheral): Secondary | ICD-10-CM | POA: Diagnosis not present

## 2019-09-09 DIAGNOSIS — J309 Allergic rhinitis, unspecified: Secondary | ICD-10-CM | POA: Diagnosis not present

## 2019-09-09 DIAGNOSIS — I890 Lymphedema, not elsewhere classified: Secondary | ICD-10-CM | POA: Diagnosis not present

## 2019-09-09 DIAGNOSIS — I129 Hypertensive chronic kidney disease with stage 1 through stage 4 chronic kidney disease, or unspecified chronic kidney disease: Secondary | ICD-10-CM | POA: Diagnosis not present

## 2019-09-09 DIAGNOSIS — M1990 Unspecified osteoarthritis, unspecified site: Secondary | ICD-10-CM | POA: Diagnosis not present

## 2019-09-10 DIAGNOSIS — J309 Allergic rhinitis, unspecified: Secondary | ICD-10-CM | POA: Diagnosis not present

## 2019-09-10 DIAGNOSIS — I872 Venous insufficiency (chronic) (peripheral): Secondary | ICD-10-CM | POA: Diagnosis not present

## 2019-09-10 DIAGNOSIS — H25019 Cortical age-related cataract, unspecified eye: Secondary | ICD-10-CM | POA: Diagnosis not present

## 2019-09-10 DIAGNOSIS — N1831 Chronic kidney disease, stage 3a: Secondary | ICD-10-CM | POA: Diagnosis not present

## 2019-09-10 DIAGNOSIS — I890 Lymphedema, not elsewhere classified: Secondary | ICD-10-CM | POA: Diagnosis not present

## 2019-09-10 DIAGNOSIS — I129 Hypertensive chronic kidney disease with stage 1 through stage 4 chronic kidney disease, or unspecified chronic kidney disease: Secondary | ICD-10-CM | POA: Diagnosis not present

## 2019-09-10 DIAGNOSIS — D631 Anemia in chronic kidney disease: Secondary | ICD-10-CM | POA: Diagnosis not present

## 2019-09-10 DIAGNOSIS — M1990 Unspecified osteoarthritis, unspecified site: Secondary | ICD-10-CM | POA: Diagnosis not present

## 2019-09-12 DIAGNOSIS — J309 Allergic rhinitis, unspecified: Secondary | ICD-10-CM | POA: Diagnosis not present

## 2019-09-12 DIAGNOSIS — I890 Lymphedema, not elsewhere classified: Secondary | ICD-10-CM | POA: Diagnosis not present

## 2019-09-12 DIAGNOSIS — M1990 Unspecified osteoarthritis, unspecified site: Secondary | ICD-10-CM | POA: Diagnosis not present

## 2019-09-12 DIAGNOSIS — D631 Anemia in chronic kidney disease: Secondary | ICD-10-CM | POA: Diagnosis not present

## 2019-09-12 DIAGNOSIS — N1831 Chronic kidney disease, stage 3a: Secondary | ICD-10-CM | POA: Diagnosis not present

## 2019-09-12 DIAGNOSIS — I129 Hypertensive chronic kidney disease with stage 1 through stage 4 chronic kidney disease, or unspecified chronic kidney disease: Secondary | ICD-10-CM | POA: Diagnosis not present

## 2019-09-12 DIAGNOSIS — H25019 Cortical age-related cataract, unspecified eye: Secondary | ICD-10-CM | POA: Diagnosis not present

## 2019-09-12 DIAGNOSIS — I872 Venous insufficiency (chronic) (peripheral): Secondary | ICD-10-CM | POA: Diagnosis not present

## 2019-09-17 DIAGNOSIS — I890 Lymphedema, not elsewhere classified: Secondary | ICD-10-CM | POA: Diagnosis not present

## 2019-09-17 DIAGNOSIS — N1831 Chronic kidney disease, stage 3a: Secondary | ICD-10-CM | POA: Diagnosis not present

## 2019-09-17 DIAGNOSIS — H25019 Cortical age-related cataract, unspecified eye: Secondary | ICD-10-CM | POA: Diagnosis not present

## 2019-09-17 DIAGNOSIS — D631 Anemia in chronic kidney disease: Secondary | ICD-10-CM | POA: Diagnosis not present

## 2019-09-17 DIAGNOSIS — J309 Allergic rhinitis, unspecified: Secondary | ICD-10-CM | POA: Diagnosis not present

## 2019-09-17 DIAGNOSIS — M1990 Unspecified osteoarthritis, unspecified site: Secondary | ICD-10-CM | POA: Diagnosis not present

## 2019-09-17 DIAGNOSIS — I872 Venous insufficiency (chronic) (peripheral): Secondary | ICD-10-CM | POA: Diagnosis not present

## 2019-09-17 DIAGNOSIS — I129 Hypertensive chronic kidney disease with stage 1 through stage 4 chronic kidney disease, or unspecified chronic kidney disease: Secondary | ICD-10-CM | POA: Diagnosis not present

## 2019-09-19 DIAGNOSIS — M1990 Unspecified osteoarthritis, unspecified site: Secondary | ICD-10-CM | POA: Diagnosis not present

## 2019-09-19 DIAGNOSIS — I129 Hypertensive chronic kidney disease with stage 1 through stage 4 chronic kidney disease, or unspecified chronic kidney disease: Secondary | ICD-10-CM | POA: Diagnosis not present

## 2019-09-19 DIAGNOSIS — I890 Lymphedema, not elsewhere classified: Secondary | ICD-10-CM | POA: Diagnosis not present

## 2019-09-19 DIAGNOSIS — J309 Allergic rhinitis, unspecified: Secondary | ICD-10-CM | POA: Diagnosis not present

## 2019-09-19 DIAGNOSIS — H25019 Cortical age-related cataract, unspecified eye: Secondary | ICD-10-CM | POA: Diagnosis not present

## 2019-09-19 DIAGNOSIS — I872 Venous insufficiency (chronic) (peripheral): Secondary | ICD-10-CM | POA: Diagnosis not present

## 2019-09-19 DIAGNOSIS — D631 Anemia in chronic kidney disease: Secondary | ICD-10-CM | POA: Diagnosis not present

## 2019-09-19 DIAGNOSIS — N1831 Chronic kidney disease, stage 3a: Secondary | ICD-10-CM | POA: Diagnosis not present

## 2019-09-20 DIAGNOSIS — I890 Lymphedema, not elsewhere classified: Secondary | ICD-10-CM | POA: Diagnosis not present

## 2019-09-20 DIAGNOSIS — H25019 Cortical age-related cataract, unspecified eye: Secondary | ICD-10-CM | POA: Diagnosis not present

## 2019-09-20 DIAGNOSIS — N1831 Chronic kidney disease, stage 3a: Secondary | ICD-10-CM | POA: Diagnosis not present

## 2019-09-20 DIAGNOSIS — I129 Hypertensive chronic kidney disease with stage 1 through stage 4 chronic kidney disease, or unspecified chronic kidney disease: Secondary | ICD-10-CM | POA: Diagnosis not present

## 2019-09-20 DIAGNOSIS — D631 Anemia in chronic kidney disease: Secondary | ICD-10-CM | POA: Diagnosis not present

## 2019-09-20 DIAGNOSIS — I872 Venous insufficiency (chronic) (peripheral): Secondary | ICD-10-CM | POA: Diagnosis not present

## 2019-09-20 DIAGNOSIS — J309 Allergic rhinitis, unspecified: Secondary | ICD-10-CM | POA: Diagnosis not present

## 2019-09-20 DIAGNOSIS — M1990 Unspecified osteoarthritis, unspecified site: Secondary | ICD-10-CM | POA: Diagnosis not present

## 2019-09-23 DIAGNOSIS — M1990 Unspecified osteoarthritis, unspecified site: Secondary | ICD-10-CM | POA: Diagnosis not present

## 2019-09-23 DIAGNOSIS — I890 Lymphedema, not elsewhere classified: Secondary | ICD-10-CM | POA: Diagnosis not present

## 2019-09-23 DIAGNOSIS — N1831 Chronic kidney disease, stage 3a: Secondary | ICD-10-CM | POA: Diagnosis not present

## 2019-09-23 DIAGNOSIS — I872 Venous insufficiency (chronic) (peripheral): Secondary | ICD-10-CM | POA: Diagnosis not present

## 2019-09-23 DIAGNOSIS — D631 Anemia in chronic kidney disease: Secondary | ICD-10-CM | POA: Diagnosis not present

## 2019-09-23 DIAGNOSIS — I129 Hypertensive chronic kidney disease with stage 1 through stage 4 chronic kidney disease, or unspecified chronic kidney disease: Secondary | ICD-10-CM | POA: Diagnosis not present

## 2019-09-23 DIAGNOSIS — H25019 Cortical age-related cataract, unspecified eye: Secondary | ICD-10-CM | POA: Diagnosis not present

## 2019-09-23 DIAGNOSIS — J309 Allergic rhinitis, unspecified: Secondary | ICD-10-CM | POA: Diagnosis not present

## 2019-10-09 DIAGNOSIS — I872 Venous insufficiency (chronic) (peripheral): Secondary | ICD-10-CM | POA: Diagnosis not present

## 2019-10-09 DIAGNOSIS — I129 Hypertensive chronic kidney disease with stage 1 through stage 4 chronic kidney disease, or unspecified chronic kidney disease: Secondary | ICD-10-CM | POA: Diagnosis not present

## 2019-10-09 DIAGNOSIS — D631 Anemia in chronic kidney disease: Secondary | ICD-10-CM | POA: Diagnosis not present

## 2020-01-07 ENCOUNTER — Other Ambulatory Visit: Payer: Self-pay

## 2020-01-07 ENCOUNTER — Inpatient Hospital Stay
Admission: EM | Admit: 2020-01-07 | Discharge: 2020-01-21 | DRG: 689 | Disposition: A | Discharge status: Skilled Nursing Facility | Payer: Medicare HMO | Attending: Internal Medicine | Admitting: Internal Medicine

## 2020-01-07 DIAGNOSIS — N390 Urinary tract infection, site not specified: Secondary | ICD-10-CM | POA: Diagnosis not present

## 2020-01-07 DIAGNOSIS — N1832 Chronic kidney disease, stage 3b: Secondary | ICD-10-CM

## 2020-01-07 DIAGNOSIS — B9620 Unspecified Escherichia coli [E. coli] as the cause of diseases classified elsewhere: Secondary | ICD-10-CM | POA: Diagnosis present

## 2020-01-07 DIAGNOSIS — Z20822 Contact with and (suspected) exposure to covid-19: Secondary | ICD-10-CM | POA: Diagnosis present

## 2020-01-07 DIAGNOSIS — R4182 Altered mental status, unspecified: Secondary | ICD-10-CM

## 2020-01-07 DIAGNOSIS — I872 Venous insufficiency (chronic) (peripheral): Secondary | ICD-10-CM | POA: Diagnosis present

## 2020-01-07 DIAGNOSIS — Z6830 Body mass index (BMI) 30.0-30.9, adult: Secondary | ICD-10-CM

## 2020-01-07 DIAGNOSIS — L89322 Pressure ulcer of left buttock, stage 2: Secondary | ICD-10-CM

## 2020-01-07 DIAGNOSIS — R410 Disorientation, unspecified: Secondary | ICD-10-CM

## 2020-01-07 DIAGNOSIS — E538 Deficiency of other specified B group vitamins: Secondary | ICD-10-CM

## 2020-01-07 DIAGNOSIS — D519 Vitamin B12 deficiency anemia, unspecified: Secondary | ICD-10-CM

## 2020-01-07 DIAGNOSIS — I878 Other specified disorders of veins: Secondary | ICD-10-CM | POA: Diagnosis present

## 2020-01-07 DIAGNOSIS — R531 Weakness: Secondary | ICD-10-CM

## 2020-01-07 DIAGNOSIS — Z289 Immunization not carried out for unspecified reason: Secondary | ICD-10-CM

## 2020-01-07 DIAGNOSIS — R54 Age-related physical debility: Secondary | ICD-10-CM | POA: Diagnosis present

## 2020-01-07 DIAGNOSIS — Z833 Family history of diabetes mellitus: Secondary | ICD-10-CM

## 2020-01-07 DIAGNOSIS — Z8249 Family history of ischemic heart disease and other diseases of the circulatory system: Secondary | ICD-10-CM

## 2020-01-07 DIAGNOSIS — G9341 Metabolic encephalopathy: Secondary | ICD-10-CM

## 2020-01-07 DIAGNOSIS — E669 Obesity, unspecified: Secondary | ICD-10-CM | POA: Diagnosis present

## 2020-01-07 LAB — URINALYSIS, COMPLETE (UACMP) WITH MICROSCOPIC
Bilirubin Urine: NEGATIVE
Glucose, UA: NEGATIVE mg/dL
Hgb urine dipstick: NEGATIVE
Ketones, ur: NEGATIVE mg/dL
Nitrite: POSITIVE — AB
Protein, ur: NEGATIVE mg/dL
Specific Gravity, Urine: 1.014 (ref 1.005–1.030)
WBC, UA: >50 WBC/hpf — ABNORMAL HIGH (ref 0–5)
pH: 6 (ref 5.0–8.0)

## 2020-01-07 LAB — COMPREHENSIVE METABOLIC PANEL
ALT: 9 U/L (ref 0–44)
AST: 19 U/L (ref 15–41)
Albumin: 3.3 g/dL — ABNORMAL LOW (ref 3.5–5.0)
Alkaline Phosphatase: 64 U/L (ref 38–126)
Anion gap: 9 (ref 5–15)
BUN: 18 mg/dL (ref 8–23)
CO2: 23 mmol/L (ref 22–32)
Calcium: 8.7 mg/dL — ABNORMAL LOW (ref 8.9–10.3)
Chloride: 107 mmol/L (ref 98–111)
Creatinine, Ser: 0.93 mg/dL (ref 0.44–1.00)
GFR, Estimated: 58 mL/min — ABNORMAL LOW (ref >60)
Glucose, Bld: 81 mg/dL (ref 70–99)
Potassium: 4.4 mmol/L (ref 3.5–5.1)
Sodium: 139 mmol/L (ref 135–145)
Total Bilirubin: 1.1 mg/dL (ref 0.3–1.2)
Total Protein: 6.8 g/dL (ref 6.5–8.1)

## 2020-01-07 LAB — CBC
HCT: 35 % — ABNORMAL LOW (ref 36.0–46.0)
Hemoglobin: 10.8 g/dL — ABNORMAL LOW (ref 12.0–15.0)
MCH: 30.2 pg (ref 26.0–34.0)
MCHC: 30.9 g/dL (ref 30.0–36.0)
MCV: 97.8 fL (ref 80.0–100.0)
Platelets: 226 10*3/uL (ref 150–400)
RBC: 3.58 MIL/uL — ABNORMAL LOW (ref 3.87–5.11)
RDW: 13.2 % (ref 11.5–15.5)
WBC: 4 10*3/uL (ref 4.0–10.5)
nRBC: 0 % (ref 0.0–0.2)

## 2020-01-07 LAB — RESP PANEL BY RT-PCR (FLU A&B, COVID) ARPGX2
Influenza A by PCR: NEGATIVE
Influenza B by PCR: NEGATIVE
SARS Coronavirus 2 by RT PCR: NEGATIVE

## 2020-01-07 MED ORDER — SODIUM CHLORIDE 0.9 % IV SOLN: 250.0000 mL | INTRAVENOUS | Status: DC | PRN

## 2020-01-07 MED ORDER — TRAZODONE HCL 50 MG PO TABS
25.0000 mg | ORAL_TABLET | Freq: Every evening | ORAL | Status: DC | PRN
  Administered 2020-01-08 – 2020-01-12 (×3): 25 mg via ORAL
  Filled 2020-01-07 (×3): qty 1

## 2020-01-07 MED ORDER — SODIUM CHLORIDE 0.9 % IV SOLN
1.0000 g | Freq: Once | INTRAVENOUS | Status: AC
  Administered 2020-01-07: 13:00:00 1 g via INTRAVENOUS
  Filled 2020-01-07: qty 10

## 2020-01-07 MED ORDER — ACETAMINOPHEN 650 MG RE SUPP: 650.0000 mg | Freq: Four times a day (QID) | RECTAL | Status: DC | PRN

## 2020-01-07 MED ORDER — ENOXAPARIN SODIUM 40 MG/0.4ML ~~LOC~~ SOLN
40.0000 mg | SUBCUTANEOUS | Status: DC
  Administered 2020-01-09 – 2020-01-20 (×12): 40 mg via SUBCUTANEOUS
  Filled 2020-01-07 (×13): qty 0.4

## 2020-01-07 MED ORDER — SODIUM CHLORIDE 0.9% FLUSH: 3.0000 mL | INTRAVENOUS | Status: DC | PRN

## 2020-01-07 MED ORDER — CEFDINIR 300 MG PO CAPS
300.0000 mg | ORAL_CAPSULE | Freq: Two times a day (BID) | ORAL | Status: DC
  Filled 2020-01-07 (×3): qty 1

## 2020-01-07 MED ORDER — ACETAMINOPHEN 325 MG PO TABS
650.0000 mg | ORAL_TABLET | Freq: Four times a day (QID) | ORAL | Status: DC | PRN
  Administered 2020-01-08 – 2020-01-12 (×4): 650 mg via ORAL
  Filled 2020-01-07 (×4): qty 2

## 2020-01-07 MED ORDER — SODIUM CHLORIDE 0.9% FLUSH
3.0000 mL | Freq: Two times a day (BID) | INTRAVENOUS | Status: DC
  Administered 2020-01-08 – 2020-01-16 (×14): 3 mL via INTRAVENOUS

## 2020-01-07 NOTE — ED Provider Notes (Signed)
[facility] Emergency Department Provider Note  Time seen: 8:30 AM  I have reviewed the triage vital signs and the nursing notes.   HISTORY  Chief Complaint Altered Mental Status and Wellness Check   HPI [name] [name] is a 86 y.o. female presents to the emergency department for altered mental status.  According to EMS report the daughter spoke to the patient this morning and she appeared to be confused.  Here the patient is awake and alert.  Oriented to person and place but not time.  Patient denies any symptoms.  Per report however the home health aide recently tested positive for Covid.  Patient denies any shortness of breath or cough.  States she feels "lousy" states she feels extremely weak.   Past Medical History:  Diagnosis Date   Cellulitis 10/28/2016   bilateral lower extremities.     Patient Active Problem List   Diagnosis Date Noted   Cellulitis 03/06/2017   Left leg cellulitis 11/25/2016   AKI (acute kidney injury) (HCC) 11/05/2016   Pressure injury of skin 11/05/2016   Elevated troponin 11/04/2016    Past Surgical History:  Procedure Laterality Date   ABDOMINAL HYSTERECTOMY     GANGLION CYST EXCISION      Prior to Admission medications   Medication Sig Start Date End Date Taking? Authorizing Provider  acetaminophen (TYLENOL) 325 MG tablet Take 2 tablets (650 mg total) every 6 (six) hours as needed by mouth for mild pain (or Fever >/= 101). 11/28/16   Ramonita Lab, MD  acidophilus (RISAQUAD) CAPS capsule Take 2 capsules 3 (three) times daily by mouth. 11/28/16   Gouru, Deanna Artis, MD  ascorbic acid (VITAMIN C) 100 MG tablet Take 1 tablet (100 mg total) by mouth daily. 11/07/16   Enedina Finner, MD  ferrous sulfate 325 (65 FE) MG tablet Take 1 tablet (325 mg total) by mouth daily. 11/07/16   Enedina Finner, MD  furosemide (LASIX) 20 MG tablet Take 1 tablet (20 mg total) by mouth daily as needed for edema. Patient taking differently: Take 20  mg by mouth 2 (two) times daily.  11/07/16 11/07/17  Enedina Finner, MD  Multiple Vitamins-Minerals (MULTIVITAL) tablet Take 1 tablet by mouth daily. 11/07/16   Enedina Finner, MD  potassium chloride (K-DUR,KLOR-CON) 10 MEQ tablet Take 10 mEq by mouth daily.    [provider]    No Known Allergies  Family History  Problem Relation Age of Onset   Diabetes Mellitus II Mother    Heart disease Father     Social History Social History   Tobacco Use   Smoking status: Never Smoker   Smokeless tobacco: Never Used  Substance Use Topics   Alcohol use: No   Drug use: No    Review of Systems Constitutional: Negative for fever. Cardiovascular: Negative for chest pain. Respiratory: Negative for shortness of breath. Gastrointestinal: Negative for abdominal pain, vomiting and diarrhea. Musculoskeletal: Negative for musculoskeletal complaints Neurological: Negative for headache All other ROS negative  ____________________________________________   PHYSICAL EXAM:  VITAL SIGNS: ED Triage Vitals  Enc Vitals Group     BP 01/07/20 0829 (!) 185/62     Pulse Rate 01/07/20 0829 60     Resp 01/07/20 0829 14     Temp --      Temp Source 01/07/20 0829 Oral     SpO2 01/07/20 0829 96 %     Weight 01/07/20 0824 194 lb 9.6 oz (88.3 kg)     Height 01/07/20 0824 5\' 7"  (  1.702 m)     Head Circumference --      Peak Flow --      Pain Score 01/07/20 0824 0     Pain Loc --      Pain Edu? --      Excl. in GC? --    Constitutional: Alert and oriented to person and place only. Well appearing and in no distress. Eyes: Normal exam ENT      Head: Normocephalic and atraumatic.      Mouth/Throat: Mucous membranes are moist. Cardiovascular: Normal rate, regular rhythm. Respiratory: Normal respiratory effort without tachypnea nor retractions. Breath sounds are clear  Gastrointestinal: Soft and nontender. No distention.  Musculoskeletal: Nontender with normal range of motion in all  extremities.  Neurologic:  Normal speech and language. No gross focal neurologic deficits Skin:  Skin is warm, dry and intact.  Psychiatric: Mood and affect are normal.   ____________________________________________    EKG  EKG viewed and interpreted by myself shows a normal sinus rhythm at 64 bpm with a narrow QRS, normal axis, normal intervals, no concerning ST changes.  ____________________________________________   INITIAL IMPRESSION / ASSESSMENT AND PLAN / ED COURSE  Pertinent labs & imaging results that were available during my care of the patient were reviewed by me and considered in my medical decision making (see chart for details).   Patient presents to the emergency department for altered mental status.  Per report patient's home health aide recently tested positive for Covid.  We will check labs, urinalysis and obtain a Covid swab.  We will continue to closely monitor the patient while awaiting results.  Patient agreeable to plan of care.  Urinalysis consistent with significant urinary tract infection.  We will dose IV Rocephin and reassess.  Patient continues to appear well in the emergency department.  Covid test is negative.  I spoke to the son who is now in the emergency department.  States given her weakness and confusion does not believe she be able to tolerate going home safely.  We will admit the patient to the hospital service for IV antibiotics.  Urine culture has been sent.  [name] was evaluated in Emergency Department on 01/07/2020 for the symptoms described in the history of present illness. She was evaluated in the context of the global COVID-19 pandemic, which necessitated consideration that the patient might be at risk for infection with the SARS-CoV-2 virus that causes COVID-19. Institutional protocols and algorithms that pertain to the evaluation of patients at risk for COVID-19 are in a state of rapid change based on information released by regulatory  bodies including the CDC and federal and state organizations. These policies and algorithms were followed during the patient's care in the ED.  ____________________________________________   FINAL CLINICAL IMPRESSION(S) / ED DIAGNOSES  Altered mental status Urinary tract infection   Minna Antis, MD 01/07/20 1456

## 2020-01-07 NOTE — H&P (Signed)
History and Physical    [name] [name] DXI:[id number] DOB: 1926/02/08 DOA: 01/07/2020  PCP: [name] Shames, MD   Patient coming from: home  I have personally briefly reviewed patient's old medical records in [facility]  Chief Complaint: confused, weak  HPI: [name] [name] is a 86 y.o. female with medical history significant of no major chronic medical problems.  She presents to the emergency department for altered mental status.  According to EMS report the daughter spoke to the patient this morning and she appeared to be confused.  Here the patient is awake and alert.  Oriented to person and place but not time.  Patient denies any symptoms.  Per report however the home health aide recently tested positive for Covid.  Patient denies any shortness of breath or cough.  States she feels "lousy" states she feels extremely weak   ED Course: T 98.6  167/81  HR 62  RR 11. Exam by EDP unremarkable. Lab - CMet nl, Hg 10.8, U/A positive - hazy, moderate LE, >50 wbc/hpf. Covid NEGATIVE. Patient given rocephin IV. Patient's family felt she was too weak to return home. [facility] called to admit and manage UTI.  Review of Systems: As per HPI otherwise 10 point review of systems negative.    Past Medical History:  Diagnosis Date  . Cellulitis 10/28/2016   bilateral lower extremities.     Past Surgical History:  Procedure Laterality Date  . ABDOMINAL HYSTERECTOMY    . GANGLION CYST EXCISION      Soc Hx - widowed after a long marriage. Two sets of twins: three boys, 1 girl, many grandchildren, some greats. 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 Lives in her own home, her son lives with her. Has an Warden/ranger. Is ambulatory with a walker.    reports that she has never smoked. She has never used smokeless tobacco. She reports that she does not drink alcohol and does not use drugs.  No Known Allergies  Family History  Problem  Relation Age of Onset  . Diabetes Mellitus II Mother   . Heart disease Father      Prior to Admission medications   Medication Sig Start Date End Date Taking? Authorizing Provider  acetaminophen (TYLENOL) 325 MG tablet Take 2 tablets (650 mg total) every 6 (six) hours as needed by mouth for mild pain (or Fever >/= 101). 11/28/16  Yes Ramonita Lab, MD    Physical Exam: Vitals:   01/07/20 1400 01/07/20 1500 01/07/20 1600 01/07/20 1700  BP: (!) 186/74 (!) 178/81 (!) 167/81 (!) 176/63  Pulse: 62 60 62 61  Resp: 18 (!) 24 11 13   Temp:      TempSrc:      SpO2: 97% 96% 94% 96%  Weight:      Height:         Vitals:   01/07/20 1400 01/07/20 1500 01/07/20 1600 01/07/20 1700  BP: (!) 186/74 (!) 178/81 (!) 167/81 (!) 176/63  Pulse: 62 60 62 61  Resp: 18 (!) 24 11 13   Temp:      TempSrc:      SpO2: 97% 96% 94% 96%  Weight:      Height:       General: WNWD elderly, large boned woman in no distress. Eyes: PERRL, lids and conjunctivae normal ENMT: Mucous membranes are moist. Posterior pharynx clear of any exudate or lesions.Normal dentition.  Neck: normal, supple, no masses, no thyromegaly  Respiratory: clear to auscultation bilaterally, no wheezing, no crackles. Normal respiratory effort. No accessory muscle use.  Cardiovascular: Regular rate and rhythm, no murmurs / rubs / gallops. No extremity edema. 2+ pedal pulses. No carotid bruits.  Abdomen: no tenderness, no masses palpated. No hepatosplenomegaly. Bowel sounds positive.  Musculoskeletal: no clubbing / cyanosis. Mild thickening of digits . Good ROM, no contractures. Decreased muscle tone.  Skin: no rashes, lesions, ulcers. Chronic venous stasis bilateral LE with minimal erythema. No open wounds.  Neurologic: CN 2-12 grossly intact.   Psychiatric: Normal judgment and insight. Alert and oriented x 3. Normal mood.     Labs on Admission: I have personally reviewed following labs and imaging studies  CBC: Recent Labs  Lab  01/07/20 0832  WBC 4.0  HGB 10.8*  HCT 35.0*  MCV 97.8  PLT 226   Basic Metabolic Panel: Recent Labs  Lab 01/07/20 0832  NA 139  K 4.4  CL 107  CO2 23  GLUCOSE 81  BUN 18  CREATININE 0.93  CALCIUM 8.7*   GFR: Estimated Creatinine Clearance: 45 mL/min (by C-G formula based on SCr of 0.93 mg/dL). Liver Function Tests: Recent Labs  Lab 01/07/20 0832  AST 19  ALT 9  ALKPHOS 64  BILITOT 1.1  PROT 6.8  ALBUMIN 3.3*   No results for input(s): LIPASE, AMYLASE in the last 168 hours. No results for input(s): AMMONIA in the last 168 hours. Coagulation Profile: No results for input(s): INR, PROTIME in the last 168 hours. Cardiac Enzymes: No results for input(s): CKTOTAL, CKMB, CKMBINDEX, TROPONINI in the last 168 hours. BNP (last 3 results) No results for input(s): PROBNP in the last 8760 hours. HbA1C: No results for input(s): HGBA1C in the last 72 hours. CBG: No results for input(s): GLUCAP in the last 168 hours. Lipid Profile: No results for input(s): CHOL, HDL, LDLCALC, TRIG, CHOLHDL, LDLDIRECT in the last 72 hours. Thyroid Function Tests: No results for input(s): TSH, T4TOTAL, FREET4, T3FREE, THYROIDAB in the last 72 hours. Anemia Panel: No results for input(s): VITAMINB12, FOLATE, FERRITIN, TIBC, IRON, RETICCTPCT in the last 72 hours. Urine analysis:    Component Value Date/Time   COLORURINE YELLOW (A) 01/07/2020 0832   APPEARANCEUR HAZY (A) 01/07/2020 0832   LABSPEC 1.014 01/07/2020 0832   PHURINE 6.0 01/07/2020 0832   GLUCOSEU NEGATIVE 01/07/2020 0832   HGBUR NEGATIVE 01/07/2020 0832   BILIRUBINUR NEGATIVE 01/07/2020 0832   KETONESUR NEGATIVE 01/07/2020 0832   PROTEINUR NEGATIVE 01/07/2020 0832   NITRITE POSITIVE (A) 01/07/2020 0832   LEUKOCYTESUR MODERATE (A) 01/07/2020 0832    Radiological Exams on Admission: No results found.  EKG: Independently reviewed. Sinus rhythm, APCs, old anteroseptal injury, no acute changes.   Assessment/Plan Active  Problems:   Acute lower UTI   Chronic venous stasis dermatitis of both lower extremities   UTI (urinary tract infection)    1. UTI - patient with UTI, not febrile, VSS.  Plan Start oral abx cefdinir 300 mg bid  2. Chronic venous stasis - long standing stable problem  3. Weakness - patient has been ambulatory using a walker in the home  Plan PT/OT eval  TOC re: home health/DME needs.  4. Code status - encouraged son to have discussion with mother and her PCP  DVT prophylaxis: lovenox  Code Status: full code  Family Communication: spoke with Pierce Crane, son, re: dx and tx plan  Disposition Plan: Home when stable. TOC consult placed  Consults called: none (with names) Admission status: Obs  Illene Regulus MD [facility] Pager [phone]  If 7PM-7AM, please contact night-coverage [email or web address] Password [facility]  01/07/2020, 5:43 PM

## 2020-01-07 NOTE — ED Notes (Signed)
MD aware of BP at this time

## 2020-01-07 NOTE — ED Triage Notes (Signed)
BIB [facility] from home due to calling grandsont his AM and stating that she did not feel well. Generalized weakness and fatigue, mild confusion per EMS. Alert to person, place and situation-disoriented to time. Pt has strong smell of urine; home aid recently tested positive for COVID. no cough, SOB or congestion reported or observed.   [phone] Lacretia Nicks, grandson.

## 2020-01-07 NOTE — Plan of Care (Signed)

## 2020-01-07 NOTE — Plan of Care (Signed)
  Problem: Elimination: Goal: Will not experience complications related to bowel motility 01/07/2020 2306 by Shea Evans, RN Outcome: Progressing 01/07/2020 2305 by Shea Evans, RN Outcome: Progressing Goal: Will not experience complications related to urinary retention 01/07/2020 2306 by Shea Evans, RN Outcome: Progressing 01/07/2020 2305 by Shea Evans, RN Outcome: Progressing   Problem: Safety: Goal: Ability to remain free from injury will improve 01/07/2020 2306 by Shea Evans, RN Outcome: Progressing 01/07/2020 2305 by Shea Evans, RN Outcome: Progressing   Problem: Skin Integrity: Goal: Risk for impaired skin integrity will decrease 01/07/2020 2306 by Shea Evans, RN Outcome: Progressing 01/07/2020 2305 by Shea Evans, RN Outcome: Progressing   Problem: Urinary Elimination: Goal: Signs and symptoms of infection will decrease 01/07/2020 2306 by Shea Evans, RN Outcome: Progressing 01/07/2020 2305 by Shea Evans, RN Outcome: Progressing

## 2020-01-07 NOTE — ED Notes (Signed)
Stuck by EMT student for PIV X 2 with no success; stuck by this RN X 2 with no success. Blood obtained and sent to lab with difficulty stick label.

## 2020-01-08 DIAGNOSIS — D649 Anemia, unspecified: Secondary | ICD-10-CM | POA: Diagnosis not present

## 2020-01-08 DIAGNOSIS — N390 Urinary tract infection, site not specified: Secondary | ICD-10-CM | POA: Diagnosis not present

## 2020-01-08 DIAGNOSIS — I872 Venous insufficiency (chronic) (peripheral): Secondary | ICD-10-CM | POA: Diagnosis not present

## 2020-01-08 LAB — CBC
HCT: 32.6 % — ABNORMAL LOW (ref 36.0–46.0)
Hemoglobin: 10.2 g/dL — ABNORMAL LOW (ref 12.0–15.0)
MCH: 29.7 pg (ref 26.0–34.0)
MCHC: 31.3 g/dL (ref 30.0–36.0)
MCV: 94.8 fL (ref 80.0–100.0)
Platelets: 233 10*3/uL (ref 150–400)
RBC: 3.44 MIL/uL — ABNORMAL LOW (ref 3.87–5.11)
RDW: 13 % (ref 11.5–15.5)
WBC: 4.1 10*3/uL (ref 4.0–10.5)
nRBC: 0 % (ref 0.0–0.2)

## 2020-01-08 LAB — BASIC METABOLIC PANEL
Anion gap: 7 (ref 5–15)
BUN: 16 mg/dL (ref 8–23)
CO2: 27 mmol/L (ref 22–32)
Calcium: 8.9 mg/dL (ref 8.9–10.3)
Chloride: 104 mmol/L (ref 98–111)
Creatinine, Ser: 0.84 mg/dL (ref 0.44–1.00)
GFR, Estimated: >60 mL/min (ref >60)
Glucose, Bld: 86 mg/dL (ref 70–99)
Potassium: 4.3 mmol/L (ref 3.5–5.1)
Sodium: 138 mmol/L (ref 135–145)

## 2020-01-08 MED ORDER — SODIUM CHLORIDE 0.9 % IV SOLN
1.0000 g | INTRAVENOUS | Status: DC
  Administered 2020-01-08 – 2020-01-11 (×4): 1 g via INTRAVENOUS
  Filled 2020-01-08 (×3): qty 1
  Filled 2020-01-08: qty 10

## 2020-01-08 MED ORDER — LABETALOL HCL 5 MG/ML IV SOLN
10.0000 mg | Freq: Four times a day (QID) | INTRAVENOUS | Status: DC | PRN
  Administered 2020-01-08: 21:00:00 10 mg via INTRAVENOUS
  Filled 2020-01-08: qty 4

## 2020-01-08 NOTE — Progress Notes (Signed)
Attempted call to [name] regarding SNF recommendation, left voicemail requesting a return call.  Alfonso Ramus, [place] [phone]

## 2020-01-08 NOTE — Evaluation (Signed)
Physical Therapy Evaluation Patient Details Name: [name] [name] MRN: [id number] DOB: February 20, 1926 Today's Date: 01/08/2020   History of Present Illness  86 y.o. female with medical history significant of no major chronic medical problems.  She presents to the emergency department for altered mental status.  According to EMS report the daughter spoke to the patient this morning and she appeared to be confused.  Here the patient is awake and alert.  Oriented to person and place but not time.  Patient denies any symptoms.  Per report however the home health aide recently tested positive for Covid.  Patient denies any shortness of breath or cough.  States she feels "lousy" states she feels extremely weak  Clinical Impression  Patient confused upon entering room. Alert and pleasant throughout session. She is mod independent with supine to sit with increased time and effort. Able to scoot in sitting up toward head of bed with increased time and effort. Requires min assist for bringing legs back up onto bed. Patient attempted to stand from edge of bed, but is unable at this time. Will benefit from continued skilled PT to improve strength and functional mobility.      Follow Up Recommendations SNF;Supervision for mobility/OOB    Equipment Recommendations  None recommended by PT    Recommendations for Other Services       Precautions / Restrictions Precautions Precautions: Fall Restrictions Weight Bearing Restrictions: No      Mobility  Bed Mobility Overal bed mobility: Needs Assistance Bed Mobility: Supine to Sit;Sit to Supine Rolling: Min assist   Supine to sit: Min guard Sit to supine: Min assist   General bed mobility comments: min cuing for technique and hand placement. Requires assist to bring LEs back up onto bed. Patient able to do a little scooting in sitting at edge of bed to get up higher in bed.    Transfers Overall transfer level: Needs assistance Equipment used: Rolling  walker (2 wheeled) Transfers: Sit to/from Stand Sit to Stand: Total assist;+2 physical assistance         General transfer comment: unable to achieve standing at edge of bed at this time with +1 assist.  Ambulation/Gait             General Gait Details: unable.  Stairs            Wheelchair Mobility    Modified Rankin (Stroke Patients Only)       Balance Overall balance assessment: Needs assistance Sitting-balance support: Feet supported Sitting balance-Leahy Scale: Fair Sitting balance - Comments: min guard for balance                                     Pertinent Vitals/Pain Pain Assessment: No/denies pain Pain Score: 0-No pain    Home Living Family/patient expects to be discharged to:: Skilled nursing facility Living Arrangements: Children Available Help at Discharge: Family;Available PRN/intermittently;Personal care attendant Type of Home: House   Entrance Stairs-Rails: None   Home Layout: One level Home Equipment: Bedside commode;Walker - 2 wheels;Wheelchair - manual      Prior Function Level of Independence: Needs assistance   Gait / Transfers Assistance Needed: Pt reporting she ambulates with RW. OT called son who reports she transfers only in wheelchair  ADL's / Homemaking Assistance Needed: Pt reports bathing while seated on EOB with assistance from Aide for LB self care  Comments: Pt is at home alone after aide  leaves and son is at work     Higher education careers adviser   Dominant Hand: Right    Extremity/Trunk Assessment   Upper Extremity Assessment Upper Extremity Assessment: Defer to OT evaluation    Lower Extremity Assessment Lower Extremity Assessment: Generalized weakness    Cervical / Trunk Assessment Cervical / Trunk Assessment: Normal  Communication   Communication: No difficulties  Cognition Arousal/Alertness: Awake/alert Behavior During Therapy: WFL for tasks assessed/performed Overall Cognitive Status:  Impaired/Different from baseline Area of Impairment: Orientation                 Orientation Level: Disoriented to;Place;Time;Situation             General Comments: son reports via phone that she "sometimes knows things like the date and sometimes she doesn't". Pt reports she ambulates at home but son reports transfers to wheelchair only.      General Comments      Exercises Other Exercises Other Exercises: Seated LAQ x 10, supine SLR, hip abd/add, ap x 10 reps with cues and assist for technique   Assessment/Plan    PT Assessment Patient needs continued PT services  PT Problem List Decreased strength;Decreased mobility;Decreased activity tolerance;Decreased cognition;Decreased balance;Decreased safety awareness       PT Treatment Interventions Therapeutic activities;Gait training;Therapeutic exercise;Stair training;Functional mobility training;Patient/family education    PT Goals (Current goals can be found in the Care Plan section)  Acute Rehab PT Goals Patient Stated Goal: to go home PT Goal Formulation: With patient Time For Goal Achievement: 01/16/20 Potential to Achieve Goals: Fair    Frequency Min 2X/week   Barriers to discharge Decreased caregiver support      Co-evaluation               AM-PAC PT "6 Clicks" Mobility  Outcome Measure Help needed turning from your back to your side while in a flat bed without using bedrails?: A Little Help needed moving from lying on your back to sitting on the side of a flat bed without using bedrails?: A Little Help needed moving to and from a bed to a chair (including a wheelchair)?: Total Help needed standing up from a chair using your arms (e.g., wheelchair or bedside chair)?: Total Help needed to walk in hospital room?: Total Help needed climbing 3-5 steps with a railing? : Total 6 Click Score: 10    End of Session   Activity Tolerance: Patient limited by fatigue Patient left: in bed;with call  bell/phone within reach;with bed alarm set Nurse Communication: Mobility status PT Visit Diagnosis: Other abnormalities of gait and mobility (R26.89);Muscle weakness (generalized) (M62.81)    Time: 0454-0981 PT Time Calculation (min) (ACUTE ONLY): 15 min   Charges:   PT Evaluation $PT Eval Moderate Complexity: 1 Mod PT Treatments $Therapeutic Exercise: 8-22 mins        Jenniah Bhavsar, PT, GCS 01/08/20,2:31 PM

## 2020-01-08 NOTE — Progress Notes (Signed)
CSW acknowledges consult for Home Health/DME needs. CSW will follow based on PT/OT recommendations.  Alfonso Ramus, [place] [phone]

## 2020-01-08 NOTE — Evaluation (Signed)
Occupational Therapy Evaluation Patient Details Name: [name] [name] MRN: [id number] DOB: 1926/10/15 Today's Date: 01/08/2020    History of Present Illness 86 y.o. female with medical history significant of no major chronic medical problems.  She presents to the emergency department for altered mental status.  According to EMS report the daughter spoke to the patient this morning and she appeared to be confused.  Here the patient is awake and alert.  Oriented to person and place but not time.  Patient denies any symptoms.  Per report however the home health aide recently tested positive for Covid.  Patient denies any shortness of breath or cough.  States she feels "lousy" states she feels extremely weak   Clinical Impression   Patient presenting with decreased I in self care, balance, functional mobility/transfer, endurance, and safety awareness. OT called pt's son to confirm baseline. Pt living with son at home and works during the day. She has an aide that's assists with meals and self care tasks 2-3hrs/7 days wk. Pt reports that she walks and takes care of herself when she is there alone but son reports she does not ambulate and only transfers into wheelchair with assist from aide. Pt unable to fully stand with buttocks clearing with total A from therapist. Multiple attempts made with and without RW. Pt then very impulsive on EOB and still attempting to get into chair on her own. OT discussed recommendation for short term rehab before returning to home with son who verbalized agreement. Patient will benefit from acute OT to increase overall independence in the areas of ADLs, functional mobility, and safety awareness in order to safely discharge to next venue of care.    Follow Up Recommendations  SNF;Supervision/Assistance - 24 hour    Equipment Recommendations  Other (comment) (defer to next venue of care)       Precautions / Restrictions Precautions Precautions: Fall      Mobility Bed  Mobility Overal bed mobility: Needs Assistance Bed Mobility: Rolling;Supine to Sit;Sit to Supine Rolling: Min assist   Supine to sit: Min assist Sit to supine: Mod assist   General bed mobility comments: min cuing for technique and hand placement.    Transfers Overall transfer level: Needs assistance Equipment used: 1 person hand held assist;Rolling walker (2 wheeled)             General transfer comment: multiple attempts to stand with RW and without and pt able to clear buttocks with total A but unable to come upright into standing. Pt giving very little effort and informing therapist " you aren't strong enough"    Balance Overall balance assessment: Needs assistance Sitting-balance support: Feet supported;Bilateral upper extremity supported Sitting balance-Leahy Scale: Fair Sitting balance - Comments: min guard for balance                                   ADL either performed or assessed with clinical judgement   ADL Overall ADL's : Needs assistance/impaired Eating/Feeding: Set up;Bed level   Grooming: Wash/dry hands;Set up;Bed level               Lower Body Dressing: Total assistance;Maximal assistance;Bed level                       Vision Patient Visual Report: No change from baseline              Pertinent Vitals/Pain Pain Assessment:  Faces Pain Score: 0-No pain     Hand Dominance Right   Extremity/Trunk Assessment Upper Extremity Assessment Upper Extremity Assessment: Generalized weakness   Lower Extremity Assessment Lower Extremity Assessment: Generalized weakness       Communication Communication Communication: No difficulties   Cognition Arousal/Alertness: Awake/alert Behavior During Therapy: Impulsive Overall Cognitive Status: History of cognitive impairments - at baseline                                 General Comments: son reports via phone that she "sometimes knows things like the date  and sometimes she doesn't". Pt reports she ambulates at home but son reports transfers to wheelchair only.              Home Living Family/patient expects to be discharged to:: Private residence Living Arrangements: Children (lives with son) Available Help at Discharge: Family;Available PRN/intermittently;Personal care attendant Type of Home: House     Entrance Stairs-Rails: None Home Layout: One level     Bathroom Shower/Tub: Other (comment) (sponge bathing from bed with assist from personal care attendant)   Bathroom Toilet: Standard Bathroom Accessibility: No   Home Equipment: Bedside commode;Walker - 2 wheels;Wheelchair - manual          Prior Functioning/Environment Level of Independence: Needs assistance  Gait / Transfers Assistance Needed: Pt reporting she ambulates with RW. OT called son who reports she transfers only in wheelchair ADL's / Homemaking Assistance Needed: Pt reports bathing while seated on EOB with assistance from Aide for LB self care   Comments: Pt is at home alone after aide leaves and son is at work        OT Problem List: Decreased strength;Decreased cognition;Decreased activity tolerance;Decreased safety awareness;Impaired balance (sitting and/or standing);Decreased knowledge of use of DME or AE;Decreased knowledge of precautions      OT Treatment/Interventions: Self-care/ADL training;Therapeutic exercise;Balance training;Therapeutic activities;Energy conservation;Cognitive remediation/compensation;DME and/or AE instruction;Patient/family education    OT Goals(Current goals can be found in the care plan section) Acute Rehab OT Goals Patient Stated Goal: to get better OT Goal Formulation: With patient Time For Goal Achievement: 01/22/20 Potential to Achieve Goals: Good ADL Goals Pt Will Perform Grooming: with set-up;sitting Pt Will Perform Lower Body Dressing: with mod assist;bed level Pt Will Transfer to Toilet: with mod assist;bedside  commode Pt Will Perform Toileting - Clothing Manipulation and hygiene: with mod assist;sit to/from stand  OT Frequency: Min 1X/week   Barriers to D/C: Decreased caregiver support  son works during the day and pt is alone after aide leaves          AM-PAC OT "6 Clicks" Daily Activity     Outcome Measure Help from another person eating meals?: A Little Help from another person taking care of personal grooming?: A Little Help from another person toileting, which includes using toliet, bedpan, or urinal?: Total Help from another person bathing (including washing, rinsing, drying)?: A Lot Help from another person to put on and taking off regular upper body clothing?: A Lot Help from another person to put on and taking off regular lower body clothing?: Total 6 Click Score: 12   End of Session Equipment Utilized During Treatment: Rolling walker Nurse Communication: Mobility status;Other (comment) (SNF rec)  Activity Tolerance: Patient tolerated treatment well Patient left: in bed;with call bell/phone within reach;with bed alarm set;with nursing/sitter in room  OT Visit Diagnosis: Unsteadiness on feet (R26.81);Muscle weakness (generalized) (M62.81)  Time: 2707-8675 OT Time Calculation (min): 43 min Charges:  OT General Charges $OT Visit: 1 Visit OT Evaluation $OT Eval Moderate Complexity: 1 Mod OT Treatments $Self Care/Home Management : 8-22 mins $Therapeutic Activity: 8-22 mins  Jackquline Denmark, MS, OTR/L , CBIS ascom [phone]  01/08/20, 12:32 PM

## 2020-01-08 NOTE — Progress Notes (Signed)
PROGRESS NOTE    [name] [name]  YTK:[id number] DOB: 09/18/1926 DOA: 01/07/2020 PCP: Leotis Shames, MD    Assessment & Plan:   Active Problems:   Acute lower UTI   UTI (urinary tract infection)   Chronic venous stasis dermatitis of both lower extremities   UTI: urine cx is pending. Continue on IV ceftriaxone  Chronic venous stasis: continue w/ supportive care  Generalized weakness: PT/OT consulted   Normocytic anemia: H&H are stable. No need for a transfusion at this time   DVT prophylaxis: lovenox Code Status: full Family Communication:  Disposition Plan:  Depends on PT/OT recs   Status is: Observation  The patient remains OBS appropriate and will d/c before 2 midnights.  Dispo: The patient is from: Home              Anticipated d/c is to: SNF vs home health               Anticipated d/c date is: 2 days              Patient currently is not medically stable to d/c.        Consultants:      Procedures:   Antimicrobials:   Subjective: Pt c/o faitgue   Objective: Vitals:   01/07/20 1800 01/07/20 1902 01/07/20 1947 01/08/20 0432  BP: (!) 179/127 (!) 165/67 (!) 158/77 (!) 152/117  Pulse: 64 65 63 (!) 58  Resp: 20 16 16 16   Temp: 98.2 F (36.8 C) 99.1 F (37.3 C) 99.2 F (37.3 C) 98.4 F (36.9 C)  TempSrc: Oral  Oral   SpO2: 95% 95% 96% 97%  Weight:      Height:        Intake/Output Summary (Last 24 hours) at 01/08/2020 0800 Last data filed at 01/08/2020 0058 Gross per 24 hour  Intake --  Output 750 ml  Net -750 ml   Filed Weights   01/07/20 0824  Weight: 88.3 kg    Examination:  General exam: Appears calm and comfortable  Respiratory system: Clear to auscultation. Respiratory effort normal. Cardiovascular system: S1 & S2+. No  rubs, gallops or clicks.  Gastrointestinal system: Abdomen is nondistended, soft and nontender. Normal bowel sounds heard. Central nervous system: Alert and oriented. Moves all 4 extremities  Psychiatry:  Judgement and insight appear normal. Flat mood and affect     Data Reviewed: I have personally reviewed following labs and imaging studies  CBC: Recent Labs  Lab 01/07/20 0832  WBC 4.0  HGB 10.8*  HCT 35.0*  MCV 97.8  PLT 226   Basic Metabolic Panel: Recent Labs  Lab 01/07/20 0832  NA 139  K 4.4  CL 107  CO2 23  GLUCOSE 81  BUN 18  CREATININE 0.93  CALCIUM 8.7*   GFR: Estimated Creatinine Clearance: 45 mL/min (by C-G formula based on SCr of 0.93 mg/dL). Liver Function Tests: Recent Labs  Lab 01/07/20 0832  AST 19  ALT 9  ALKPHOS 64  BILITOT 1.1  PROT 6.8  ALBUMIN 3.3*   No results for input(s): LIPASE, AMYLASE in the last 168 hours. No results for input(s): AMMONIA in the last 168 hours. Coagulation Profile: No results for input(s): INR, PROTIME in the last 168 hours. Cardiac Enzymes: No results for input(s): CKTOTAL, CKMB, CKMBINDEX, TROPONINI in the last 168 hours. BNP (last 3 results) No results for input(s): PROBNP in the last 8760 hours. HbA1C: No results for input(s): HGBA1C in the last 72 hours. CBG: No results for  input(s): GLUCAP in the last 168 hours. Lipid Profile: No results for input(s): CHOL, HDL, LDLCALC, TRIG, CHOLHDL, LDLDIRECT in the last 72 hours. Thyroid Function Tests: No results for input(s): TSH, T4TOTAL, FREET4, T3FREE, THYROIDAB in the last 72 hours. Anemia Panel: No results for input(s): VITAMINB12, FOLATE, FERRITIN, TIBC, IRON, RETICCTPCT in the last 72 hours. Sepsis Labs: No results for input(s): PROCALCITON, LATICACIDVEN in the last 168 hours.  Recent Results (from the past 240 hour(s))  Resp Panel by RT-PCR (Flu A&B, Covid) Nasopharyngeal Swab     Status: None   Collection Time: 01/07/20  8:32 AM   Specimen: Nasopharyngeal Swab; Nasopharyngeal(NP) swabs in vial transport medium  Result Value Ref Range Status   SARS Coronavirus 2 by RT PCR NEGATIVE NEGATIVE Final    Comment: (NOTE) SARS-CoV-2 target nucleic acids are  NOT DETECTED.  The SARS-CoV-2 RNA is generally detectable in upper respiratory specimens during the acute phase of infection. The lowest concentration of SARS-CoV-2 viral copies this assay can detect is 138 copies/mL. A negative result does not preclude SARS-Cov-2 infection and should not be used as the sole basis for treatment or other patient management decisions. A negative result may occur with  improper specimen collection/handling, submission of specimen other than nasopharyngeal swab, presence of viral mutation(s) within the areas targeted by this assay, and inadequate number of viral copies(<138 copies/mL). A negative result must be combined with clinical observations, patient history, and epidemiological information. The expected result is Negative.  Fact Sheet for Patients:  BloggerCourse.com  Fact Sheet for Healthcare Providers:  SeriousBroker.it  This test is no t yet approved or cleared by the [place] FDA and  has been authorized for detection and/or diagnosis of SARS-CoV-2 by FDA under an Emergency Use Authorization (EUA). This EUA will remain  in effect (meaning this test can be used) for the duration of the COVID-19 declaration under Section 564(b)(1) of the Act, 21 U.S.C.section 360bbb-3(b)(1), unless the authorization is terminated  or revoked sooner.       Influenza A by PCR NEGATIVE NEGATIVE Final   Influenza B by PCR NEGATIVE NEGATIVE Final    Comment: (NOTE) The Xpert Xpress SARS-CoV-2/FLU/RSV plus assay is intended as an aid in the diagnosis of influenza from Nasopharyngeal swab specimens and should not be used as a sole basis for treatment. Nasal washings and aspirates are unacceptable for Xpert Xpress SARS-CoV-2/FLU/RSV testing.  Fact Sheet for Patients: BloggerCourse.com  Fact Sheet for Healthcare Providers: SeriousBroker.it  This test is not  yet approved or cleared by the [place] FDA and has been authorized for detection and/or diagnosis of SARS-CoV-2 by FDA under an Emergency Use Authorization (EUA). This EUA will remain in effect (meaning this test can be used) for the duration of the COVID-19 declaration under Section 564(b)(1) of the Act, 21 U.S.C. section 360bbb-3(b)(1), unless the authorization is terminated or revoked.  Performed at [facility], [address] [place], [place] [address]          Radiology Studies: No results found.      Scheduled Meds: . cefdinir  300 mg Oral Q12H  . enoxaparin (LOVENOX) injection  40 mg Subcutaneous Q24H  . sodium chloride flush  3 mL Intravenous Q12H   Continuous Infusions: . sodium chloride       LOS: 0 days    Time spent: 32 mins    Charise Killian, MD [facility] Pager [phone]  If 7PM-7AM, please contact night-coverage 01/08/2020, 8:00 AM

## 2020-01-09 DIAGNOSIS — R531 Weakness: Secondary | ICD-10-CM

## 2020-01-09 DIAGNOSIS — I872 Venous insufficiency (chronic) (peripheral): Secondary | ICD-10-CM | POA: Diagnosis not present

## 2020-01-09 DIAGNOSIS — N390 Urinary tract infection, site not specified: Secondary | ICD-10-CM | POA: Diagnosis not present

## 2020-01-09 LAB — MRSA PCR SCREENING: MRSA by PCR: NEGATIVE

## 2020-01-09 NOTE — TOC Initial Note (Signed)
Transition of Care [facility]) - Initial/Assessment Note    Patient Details  Name: [name] [name] MRN: [id number] Date of Birth: [birth year]  Transition of [facility] CM/SW Contact:    Trenton Founds, RN Phone Number: 01/09/2020, 10:48 AM  Clinical Narrative:    RNCM attempted to contact patient's son [name] without success. RNCM reached out to patient's grandson [name] and was able to complete assessment, [name] reports that he lives with patient and is her primary caregiver. He reports that he should be main point of contact. [name] is agreeable to SNF work up but reports that this would not be a long term plan and they would want to bring patient home after rehab. [name] is agreeable to a bed search of all local facilities.  RNCM verified PASSR, completed FL2 and started bed search.                Expected Discharge Plan: Skilled Nursing Facility Barriers to Discharge: No Barriers Identified   Patient Goals and CMS Choice        Expected Discharge Plan and Services Expected Discharge Plan: Skilled Nursing Facility     Post Acute Care Choice: Skilled Nursing Facility Living arrangements for the past 2 months: Single Family Home                                      Prior Living Arrangements/Services Living arrangements for the past 2 months: Single Family Home Lives with:: Adult Children Patient language and need for interpreter reviewed:: Yes Do you feel safe going back to the place where you live?: Yes      Need for Family Participation in Patient Care: Yes (Comment) Care giver support system in place?: Yes (comment)   Criminal Activity/Legal Involvement Pertinent to Current Situation/Hospitalization: No - Comment as needed  Activities of Daily Living      Permission Sought/Granted                  Emotional Assessment Appearance:: Appears stated age       Alcohol / Substance Use: Not Applicable Psych Involvement: No (comment)  Admission diagnosis:   Lower urinary tract infectious disease [N39.0] UTI (urinary tract infection) [N39.0] Altered mental status, unspecified altered mental status type [R41.82] Patient Active Problem List   Diagnosis Date Noted  . Acute lower UTI 01/07/2020  . UTI (urinary tract infection) 01/07/2020  . Chronic venous stasis dermatitis of both lower extremities 01/07/2020  . Cellulitis 03/06/2017  . Left leg cellulitis 11/25/2016  . AKI (acute kidney injury) (HCC) 11/05/2016  . Pressure injury of skin 11/05/2016  . Elevated troponin 11/04/2016   PCP:  Leotis Shames, MD Pharmacy:   [facility] [address] (N), [place] - [address] [place]) [place] [address] Phone: [phone] Fax: [phone]     Social Determinants of Health (SDOH) Interventions    Readmission Risk Interventions No flowsheet data found.

## 2020-01-09 NOTE — NC FL2 (Signed)
[place] MEDICAID FL2 LEVEL OF CARE SCREENING TOOL     IDENTIFICATION  Patient Name: [name] [name] Birthdate: 02/29/1926 Sex: female Admission Date (Current Location): 01/07/2020  [place] and [place] Number:  Chiropodist and Address:  [facility], [address] [place], [place] [address]      Provider Number: [id number]  Attending Physician Name and Address:  Charise Killian, MD  Relative Name and Phone Number:  Chantavia Bazzle [phone]    Current Level of Care: Hospital Recommended Level of Care: Skilled Nursing Facility Prior Approval Number:    Date Approved/Denied:   PASRR Number: [id number]  Discharge Plan: SNF    Current Diagnoses: Patient Active Problem List   Diagnosis Date Noted  . Acute lower UTI 01/07/2020  . UTI (urinary tract infection) 01/07/2020  . Chronic venous stasis dermatitis of both lower extremities 01/07/2020  . Cellulitis 03/06/2017  . Left leg cellulitis 11/25/2016  . AKI (acute kidney injury) (HCC) 11/05/2016  . Pressure injury of skin 11/05/2016  . Elevated troponin 11/04/2016    Orientation RESPIRATION BLADDER Height & Weight     Time,Self  Normal External catheter Weight: 88.3 kg Height:  5\' 7"  (170.2 cm)  BEHAVIORAL SYMPTOMS/MOOD NEUROLOGICAL BOWEL NUTRITION STATUS      Continent Diet (Regular)  AMBULATORY STATUS COMMUNICATION OF NEEDS Skin   Extensive Assist Verbally Normal                       Personal Care Assistance Level of Assistance  Bathing,Feeding,Dressing Bathing Assistance: Maximum assistance Feeding assistance: Limited assistance Dressing Assistance: Maximum assistance     Functional Limitations Info  Sight,Hearing,Speech Sight Info: Impaired Hearing Info: Impaired Speech Info: Impaired    SPECIAL CARE FACTORS FREQUENCY  OT (By licensed OT),PT (By licensed PT)                    Contractures Contractures Info: Not present    Additional Factors  Info  Code Status,Allergies Code Status Info: Full Allergies Info: No known allergies           Current Medications (01/09/2020):  This is the current hospital active medication list Current Facility-Administered Medications  Medication Dose Route Frequency Provider Last Rate Last Admin  . 0.9 %  sodium chloride infusion  250 mL Intravenous PRN Norins, 01/11/2020, MD      . acetaminophen (TYLENOL) tablet 650 mg  650 mg Oral Q6H PRN Rosalyn Gess, MD   650 mg at 01/09/20 01/11/20   Or  . acetaminophen (TYLENOL) suppository 650 mg  650 mg Rectal Q6H PRN Norins, [id number], MD      . cefTRIAXone (ROCEPHIN) 1 g in sodium chloride 0.9 % 100 mL IVPB  1 g Intravenous Q24H Rosalyn Gess, MD 200 mL/hr at 01/09/20 0933 1 g at 01/09/20 0933  . enoxaparin (LOVENOX) injection 40 mg  40 mg Subcutaneous Q24H Norins, 01/11/20, MD      . labetalol (NORMODYNE) injection 10 mg  10 mg Intravenous Q6H PRN Rosalyn Gess, MD   10 mg at 01/08/20 2048  . sodium chloride flush (NS) 0.9 % injection 3 mL  3 mL Intravenous Q12H Norins, 2049, MD   3 mL at 01/09/20 0934  . sodium chloride flush (NS) 0.9 % injection 3 mL  3 mL Intravenous PRN Norins, 01/11/20, MD      . traZODone (DESYREL) tablet 25 mg  25 mg Oral QHS PRN Norins,  Rosalyn Gess, MD   25 mg at 01/08/20 2138     Discharge Medications: Please see discharge summary for a list of discharge medications.  Relevant Imaging Results:  Relevant Lab Results:   Additional Information SS# [id number]  Trenton Founds, RN

## 2020-01-09 NOTE — Progress Notes (Addendum)
PROGRESS NOTE    [name] [name]  GLO:[id number] DOB: 10-Nov-1926 DOA: 01/07/2020 PCP: Leotis Shames, MD    Assessment & Plan:   Active Problems:   Acute lower UTI   UTI (urinary tract infection)   Chronic venous stasis dermatitis of both lower extremities   UTI: urine cx is growing gram neg rods, sens pending. Continue on IV ceftriaxone  Chronic venous stasis: continue w/ supportive care  Generalized weakness: PT/OT recs SNF   Normocytic anemia: H&H are stable. Will continue to monitor   DVT prophylaxis: lovenox Code Status: full Family Communication: discussed pt's care w/ pt's grandson, [name], and answered his questions Disposition Plan:  Likely d/c to SNF    Status is: Observation  The patient remains OBS appropriate and will d/c before 2 midnights.  Dispo: The patient is from: Home              Anticipated d/c is to: SNF vs home health               Anticipated d/c date is: 2 days              Patient currently is not medically stable to d/c.        Consultants:      Procedures:   Antimicrobials:   Subjective: Pt c/o malaise   Objective: Vitals:   01/08/20 2129 01/08/20 2244 01/09/20 0548 01/09/20 0651  BP: (!) 177/73 (!) 136/53 (!) 177/67 (!) 141/63  Pulse: (!) 57 (!) 54 (!) 51 (!) 58  Resp: 16 18 16    Temp: 98.8 F (37.1 C) 98.2 F (36.8 C) (!) 97.4 F (36.3 C)   TempSrc: Oral Oral Oral   SpO2:  96% 99%   Weight:      Height:        Intake/Output Summary (Last 24 hours) at 01/09/2020 0740 Last data filed at 01/09/2020 0500 Gross per 24 hour  Intake 580 ml  Output 500 ml  Net 80 ml   Filed Weights   01/07/20 0824  Weight: 88.3 kg    Examination:  General exam: Appears calm & comfortable  Respiratory system: clear breath sounds b/l. No wheezes, rales, rhonchi  Cardiovascular system: S1 & S2+. No  rubs, gallops or clicks.  Gastrointestinal system: Abd is soft, non-tender, non-distended & hypoactive bowel sounds  Central  nervous system: Alert and oriented to person. Moves all 4 extremities  Psychiatry: Judgement and insight appear normal. Flat mood and affect    Data Reviewed: I have personally reviewed following labs and imaging studies  CBC: Recent Labs  Lab 01/07/20 0832 01/08/20 0836  WBC 4.0 4.1  HGB 10.8* 10.2*  HCT 35.0* 32.6*  MCV 97.8 94.8  PLT 226 233   Basic Metabolic Panel: Recent Labs  Lab 01/07/20 0832 01/08/20 0836  NA 139 138  K 4.4 4.3  CL 107 104  CO2 23 27  GLUCOSE 81 86  BUN 18 16  CREATININE 0.93 0.84  CALCIUM 8.7* 8.9   GFR: Estimated Creatinine Clearance: 49.8 mL/min (by C-G formula based on SCr of 0.84 mg/dL). Liver Function Tests: Recent Labs  Lab 01/07/20 0832  AST 19  ALT 9  ALKPHOS 64  BILITOT 1.1  PROT 6.8  ALBUMIN 3.3*   No results for input(s): LIPASE, AMYLASE in the last 168 hours. No results for input(s): AMMONIA in the last 168 hours. Coagulation Profile: No results for input(s): INR, PROTIME in the last 168 hours. Cardiac Enzymes: No results for input(s): CKTOTAL, CKMB,  CKMBINDEX, TROPONINI in the last 168 hours. BNP (last 3 results) No results for input(s): PROBNP in the last 8760 hours. HbA1C: No results for input(s): HGBA1C in the last 72 hours. CBG: No results for input(s): GLUCAP in the last 168 hours. Lipid Profile: No results for input(s): CHOL, HDL, LDLCALC, TRIG, CHOLHDL, LDLDIRECT in the last 72 hours. Thyroid Function Tests: No results for input(s): TSH, T4TOTAL, FREET4, T3FREE, THYROIDAB in the last 72 hours. Anemia Panel: No results for input(s): VITAMINB12, FOLATE, FERRITIN, TIBC, IRON, RETICCTPCT in the last 72 hours. Sepsis Labs: No results for input(s): PROCALCITON, LATICACIDVEN in the last 168 hours.  Recent Results (from the past 240 hour(s))  Resp Panel by RT-PCR (Flu A&B, Covid) Nasopharyngeal Swab     Status: None   Collection Time: 01/07/20  8:32 AM   Specimen: Nasopharyngeal Swab; Nasopharyngeal(NP) swabs  in vial transport medium  Result Value Ref Range Status   SARS Coronavirus 2 by RT PCR NEGATIVE NEGATIVE Final    Comment: (NOTE) SARS-CoV-2 target nucleic acids are NOT DETECTED.  The SARS-CoV-2 RNA is generally detectable in upper respiratory specimens during the acute phase of infection. The lowest concentration of SARS-CoV-2 viral copies this assay can detect is 138 copies/mL. A negative result does not preclude SARS-Cov-2 infection and should not be used as the sole basis for treatment or other patient management decisions. A negative result may occur with  improper specimen collection/handling, submission of specimen other than nasopharyngeal swab, presence of viral mutation(s) within the areas targeted by this assay, and inadequate number of viral copies(<138 copies/mL). A negative result must be combined with clinical observations, patient history, and epidemiological information. The expected result is Negative.  Fact Sheet for Patients:  BloggerCourse.com  Fact Sheet for Healthcare Providers:  SeriousBroker.it  This test is no t yet approved or cleared by the [place] FDA and  has been authorized for detection and/or diagnosis of SARS-CoV-2 by FDA under an Emergency Use Authorization (EUA). This EUA will remain  in effect (meaning this test can be used) for the duration of the COVID-19 declaration under Section 564(b)(1) of the Act, 21 U.S.C.section 360bbb-3(b)(1), unless the authorization is terminated  or revoked sooner.       Influenza A by PCR NEGATIVE NEGATIVE Final   Influenza B by PCR NEGATIVE NEGATIVE Final    Comment: (NOTE) The Xpert Xpress SARS-CoV-2/FLU/RSV plus assay is intended as an aid in the diagnosis of influenza from Nasopharyngeal swab specimens and should not be used as a sole basis for treatment. Nasal washings and aspirates are unacceptable for Xpert Xpress  SARS-CoV-2/FLU/RSV testing.  Fact Sheet for Patients: BloggerCourse.com  Fact Sheet for Healthcare Providers: SeriousBroker.it  This test is not yet approved or cleared by the [place] FDA and has been authorized for detection and/or diagnosis of SARS-CoV-2 by FDA under an Emergency Use Authorization (EUA). This EUA will remain in effect (meaning this test can be used) for the duration of the COVID-19 declaration under Section 564(b)(1) of the Act, 21 U.S.C. section 360bbb-3(b)(1), unless the authorization is terminated or revoked.  Performed at [facility], [address], [place], [place] [address]          Radiology Studies: No results found.      Scheduled Meds:  enoxaparin (LOVENOX) injection  40 mg Subcutaneous Q24H   sodium chloride flush  3 mL Intravenous Q12H   Continuous Infusions:  sodium chloride     cefTRIAXone (ROCEPHIN)  IV 1 g (01/08/20 7425)  LOS: 0 days    Time spent: 30 mins    Charise Killian, MD [facility] Pager [phone]  If 7PM-7AM, please contact night-coverage 01/09/2020, 7:40 AM

## 2020-01-10 DIAGNOSIS — E538 Deficiency of other specified B group vitamins: Secondary | ICD-10-CM | POA: Diagnosis not present

## 2020-01-10 DIAGNOSIS — B9620 Unspecified Escherichia coli [E. coli] as the cause of diseases classified elsewhere: Secondary | ICD-10-CM | POA: Diagnosis present

## 2020-01-10 DIAGNOSIS — E669 Obesity, unspecified: Secondary | ICD-10-CM | POA: Diagnosis present

## 2020-01-10 DIAGNOSIS — Z8249 Family history of ischemic heart disease and other diseases of the circulatory system: Secondary | ICD-10-CM | POA: Diagnosis not present

## 2020-01-10 DIAGNOSIS — Z289 Immunization not carried out for unspecified reason: Secondary | ICD-10-CM | POA: Diagnosis not present

## 2020-01-10 DIAGNOSIS — Z833 Family history of diabetes mellitus: Secondary | ICD-10-CM | POA: Diagnosis not present

## 2020-01-10 DIAGNOSIS — N390 Urinary tract infection, site not specified: Secondary | ICD-10-CM | POA: Diagnosis present

## 2020-01-10 DIAGNOSIS — Z6830 Body mass index (BMI) 30.0-30.9, adult: Secondary | ICD-10-CM | POA: Diagnosis not present

## 2020-01-10 DIAGNOSIS — Z20822 Contact with and (suspected) exposure to covid-19: Secondary | ICD-10-CM | POA: Diagnosis present

## 2020-01-10 DIAGNOSIS — G9341 Metabolic encephalopathy: Secondary | ICD-10-CM | POA: Diagnosis present

## 2020-01-10 DIAGNOSIS — N1832 Chronic kidney disease, stage 3b: Secondary | ICD-10-CM | POA: Diagnosis present

## 2020-01-10 DIAGNOSIS — D519 Vitamin B12 deficiency anemia, unspecified: Secondary | ICD-10-CM | POA: Diagnosis present

## 2020-01-10 DIAGNOSIS — L89322 Pressure ulcer of left buttock, stage 2: Secondary | ICD-10-CM | POA: Diagnosis present

## 2020-01-10 DIAGNOSIS — I878 Other specified disorders of veins: Secondary | ICD-10-CM | POA: Diagnosis present

## 2020-01-10 DIAGNOSIS — R531 Weakness: Secondary | ICD-10-CM | POA: Diagnosis not present

## 2020-01-10 DIAGNOSIS — R54 Age-related physical debility: Secondary | ICD-10-CM | POA: Diagnosis present

## 2020-01-10 DIAGNOSIS — I872 Venous insufficiency (chronic) (peripheral): Secondary | ICD-10-CM | POA: Diagnosis present

## 2020-01-10 LAB — BASIC METABOLIC PANEL
Anion gap: 5 (ref 5–15)
BUN: 17 mg/dL (ref 8–23)
CO2: 27 mmol/L (ref 22–32)
Calcium: 8.3 mg/dL — ABNORMAL LOW (ref 8.9–10.3)
Chloride: 108 mmol/L (ref 98–111)
Creatinine, Ser: 0.97 mg/dL (ref 0.44–1.00)
GFR, Estimated: 55 mL/min — ABNORMAL LOW (ref >60)
Glucose, Bld: 99 mg/dL (ref 70–99)
Potassium: 4.5 mmol/L (ref 3.5–5.1)
Sodium: 140 mmol/L (ref 135–145)

## 2020-01-10 LAB — URINE CULTURE: Culture: >=100000 — AB

## 2020-01-10 LAB — CBC
HCT: 33.8 % — ABNORMAL LOW (ref 36.0–46.0)
Hemoglobin: 10.5 g/dL — ABNORMAL LOW (ref 12.0–15.0)
MCH: 29.7 pg (ref 26.0–34.0)
MCHC: 31.1 g/dL (ref 30.0–36.0)
MCV: 95.5 fL (ref 80.0–100.0)
Platelets: 227 10*3/uL (ref 150–400)
RBC: 3.54 MIL/uL — ABNORMAL LOW (ref 3.87–5.11)
RDW: 13.2 % (ref 11.5–15.5)
WBC: 4 10*3/uL (ref 4.0–10.5)
nRBC: 0 % (ref 0.0–0.2)

## 2020-01-10 NOTE — Progress Notes (Signed)
PT Cancellation Note  Patient Details Name: [name] [name] MRN: [id number] DOB: October 22, 1926   Cancelled Treatment:     PT attempt. Per discussion with RN, pt has been asleep throughout most of the day. Pt was asleep upon entry with untouched lunch tray at bedside. Easily awakes however is agitated and unwilling to participate. " You need to set up a appointment before I'll work with you." Tried to schedule a return time however pt asked to be left alone. Acute PT will continue efforts per POC.     Rushie Chestnut 01/10/2020, 3:09 PM

## 2020-01-10 NOTE — TOC Progression Note (Addendum)
Transition of Care [facility]) - Progression Note    Patient Details  Name: [name] [name] MRN: [id number] Date of Birth: 03/04/1926  [facility]) CM/SW Contact  Trenton Founds, RN Phone Number: 01/10/2020, 1:55 PM  Clinical Narrative:   RNCM attempted to reach out to grandson Jamie by telephone to discuss bed offers. No answer and not able to leave message, sent generic text message for return call. Grandson returned message that he was at funeral and unavailable.     Expected Discharge Plan: Skilled Nursing Facility Barriers to Discharge: No Barriers Identified  Expected Discharge Plan and Services Expected Discharge Plan: Skilled Nursing Facility     Post Acute Care Choice: Skilled Nursing Facility Living arrangements for the past 2 months: Single Family Home                                       Social Determinants of Health (SDOH) Interventions    Readmission Risk Interventions No flowsheet data found.

## 2020-01-10 NOTE — Progress Notes (Addendum)
PROGRESS NOTE    [name] [name]  ZJQ:[id number] DOB: 12/21/1926 DOA: 01/07/2020 PCP: Leotis Shames, MD    Assessment & Plan:   Active Problems:   Acute lower UTI   UTI (urinary tract infection)   Chronic venous stasis dermatitis of both lower extremities   UTI: urine cx is growing e. coli. Continue on IV ceftriaxone x 5 days.   Chronic venous stasis: continue w/ supportive care  Generalized weakness: PT/OT recs SNF  Normocytic anemia: H&H are stable   DVT prophylaxis: lovenox Code Status: full Family Communication:  Disposition Plan:  Likely d/c to SNF    Status is: Observation  The patient remains OBS appropriate and will d/c before 2 midnights.  Dispo: The patient is from: Home              Anticipated d/c is to: SNF vs home health               Anticipated d/c date is: 2 days              Patient currently is not medically stable to d/c.        Consultants:      Procedures:   Antimicrobials:   Subjective: Pt c/o fatigue  Objective: Vitals:   01/09/20 1558 01/09/20 2040 01/09/20 2332 01/10/20 0636  BP: (!) 114/52 (!) 111/46 (!) 142/62 (!) 146/60  Pulse: 61 69 71 62  Resp: 18 17 17 17   Temp: 98.7 F (37.1 C) 98.6 F (37 C) 98.9 F (37.2 C) 98.2 F (36.8 C)  TempSrc:      SpO2: 98% 98% 96% 97%  Weight:      Height:        Intake/Output Summary (Last 24 hours) at 01/10/2020 0731 Last data filed at 01/10/2020 0630 Gross per 24 hour  Intake 80 ml  Output 700 ml  Net -620 ml   Filed Weights   01/07/20 0824  Weight: 88.3 kg    Examination:  General exam: Appears calm & comfortable  Respiratory system: clear breath sounds b/l. No wheezes, rales, rhonchi  Cardiovascular system: S1/S2+. No rubs or gallops  Gastrointestinal system: Abd is soft, non-tender, non-distended & hypoactive bowel sounds  Central nervous system: Alert and awake. Moves all 4 extremities  Psychiatry: judgement and insight appears abnormal. Flat mood and  affect    Data Reviewed: I have personally reviewed following labs and imaging studies  CBC: Recent Labs  Lab 01/07/20 0832 01/08/20 0836 01/10/20 0551  WBC 4.0 4.1 4.0  HGB 10.8* 10.2* 10.5*  HCT 35.0* 32.6* 33.8*  MCV 97.8 94.8 95.5  PLT 226 233 227   Basic Metabolic Panel: Recent Labs  Lab 01/07/20 0832 01/08/20 0836 01/10/20 0551  NA 139 138 140  K 4.4 4.3 4.5  CL 107 104 108  CO2 23 27 27   GLUCOSE 81 86 99  BUN 18 16 17   CREATININE 0.93 0.84 0.97  CALCIUM 8.7* 8.9 8.3*   GFR: Estimated Creatinine Clearance: 43.1 mL/min (by C-G formula based on SCr of 0.97 mg/dL). Liver Function Tests: Recent Labs  Lab 01/07/20 0832  AST 19  ALT 9  ALKPHOS 64  BILITOT 1.1  PROT 6.8  ALBUMIN 3.3*   No results for input(s): LIPASE, AMYLASE in the last 168 hours. No results for input(s): AMMONIA in the last 168 hours. Coagulation Profile: No results for input(s): INR, PROTIME in the last 168 hours. Cardiac Enzymes: No results for input(s): CKTOTAL, CKMB, CKMBINDEX, TROPONINI in the last 168  hours. BNP (last 3 results) No results for input(s): PROBNP in the last 8760 hours. HbA1C: No results for input(s): HGBA1C in the last 72 hours. CBG: No results for input(s): GLUCAP in the last 168 hours. Lipid Profile: No results for input(s): CHOL, HDL, LDLCALC, TRIG, CHOLHDL, LDLDIRECT in the last 72 hours. Thyroid Function Tests: No results for input(s): TSH, T4TOTAL, FREET4, T3FREE, THYROIDAB in the last 72 hours. Anemia Panel: No results for input(s): VITAMINB12, FOLATE, FERRITIN, TIBC, IRON, RETICCTPCT in the last 72 hours. Sepsis Labs: No results for input(s): PROCALCITON, LATICACIDVEN in the last 168 hours.  Recent Results (from the past 240 hour(s))  Resp Panel by RT-PCR (Flu A&B, Covid) Nasopharyngeal Swab     Status: None   Collection Time: 01/07/20  8:32 AM   Specimen: Nasopharyngeal Swab; Nasopharyngeal(NP) swabs in vial transport medium  Result Value Ref Range  Status   SARS Coronavirus 2 by RT PCR NEGATIVE NEGATIVE Final    Comment: (NOTE) SARS-CoV-2 target nucleic acids are NOT DETECTED.  The SARS-CoV-2 RNA is generally detectable in upper respiratory specimens during the acute phase of infection. The lowest concentration of SARS-CoV-2 viral copies this assay can detect is 138 copies/mL. A negative result does not preclude SARS-Cov-2 infection and should not be used as the sole basis for treatment or other patient management decisions. A negative result may occur with  improper specimen collection/handling, submission of specimen other than nasopharyngeal swab, presence of viral mutation(s) within the areas targeted by this assay, and inadequate number of viral copies(<138 copies/mL). A negative result must be combined with clinical observations, patient history, and epidemiological information. The expected result is Negative.  Fact Sheet for Patients:  BloggerCourse.com  Fact Sheet for Healthcare Providers:  SeriousBroker.it  This test is no t yet approved or cleared by the [place] FDA and  has been authorized for detection and/or diagnosis of SARS-CoV-2 by FDA under an Emergency Use Authorization (EUA). This EUA will remain  in effect (meaning this test can be used) for the duration of the COVID-19 declaration under Section 564(b)(1) of the Act, 21 U.S.C.section 360bbb-3(b)(1), unless the authorization is terminated  or revoked sooner.       Influenza A by PCR NEGATIVE NEGATIVE Final   Influenza B by PCR NEGATIVE NEGATIVE Final    Comment: (NOTE) The Xpert Xpress SARS-CoV-2/FLU/RSV plus assay is intended as an aid in the diagnosis of influenza from Nasopharyngeal swab specimens and should not be used as a sole basis for treatment. Nasal washings and aspirates are unacceptable for Xpert Xpress SARS-CoV-2/FLU/RSV testing.  Fact Sheet for  Patients: BloggerCourse.com  Fact Sheet for Healthcare Providers: SeriousBroker.it  This test is not yet approved or cleared by the [place] FDA and has been authorized for detection and/or diagnosis of SARS-CoV-2 by FDA under an Emergency Use Authorization (EUA). This EUA will remain in effect (meaning this test can be used) for the duration of the COVID-19 declaration under Section 564(b)(1) of the Act, 21 U.S.C. section 360bbb-3(b)(1), unless the authorization is terminated or revoked.  Performed at [facility], [address] [place], [place] [address]   Urine Culture     Status: Abnormal (Preliminary result)   Collection Time: 01/07/20  2:57 PM   Specimen: Urine, Random  Result Value Ref Range Status   Specimen Description   Final    URINE, RANDOM Performed at [facility], [address] [place], [place] [address]    Special Requests   Final  NONE Performed at [facility], [address], [place], [place] [address]    Culture >=100,000 COLONIES/mL GRAM NEGATIVE RODS (A)  Final   Report Status PENDING  Incomplete  MRSA PCR Screening     Status: None   Collection Time: 01/09/20  9:01 PM   Specimen: Nasopharyngeal  Result Value Ref Range Status   MRSA by PCR NEGATIVE NEGATIVE Final    Comment:        The GeneXpert MRSA Assay (FDA approved for NASAL specimens only), is one component of a comprehensive MRSA colonization surveillance program. It is not intended to diagnose MRSA infection nor to guide or monitor treatment for MRSA infections. Performed at [facility], [address] [place], [place] [address]          Radiology Studies: No results found.      Scheduled Meds: . enoxaparin (LOVENOX) injection  40 mg Subcutaneous Q24H  . sodium chloride flush  3 mL Intravenous Q12H   Continuous Infusions: . sodium chloride    . cefTRIAXone (ROCEPHIN)  IV  1 g (01/09/20 0933)     LOS: 0 days    Time spent: 30 mins    Charise Killian, MD [facility] Pager [phone]  If 7PM-7AM, please contact night-coverage 01/10/2020, 7:31 AM

## 2020-01-11 DIAGNOSIS — N390 Urinary tract infection, site not specified: Secondary | ICD-10-CM | POA: Diagnosis not present

## 2020-01-11 LAB — BASIC METABOLIC PANEL
Anion gap: 5 (ref 5–15)
BUN: 18 mg/dL (ref 8–23)
CO2: 27 mmol/L (ref 22–32)
Calcium: 8.9 mg/dL (ref 8.9–10.3)
Chloride: 105 mmol/L (ref 98–111)
Creatinine, Ser: 0.84 mg/dL (ref 0.44–1.00)
GFR, Estimated: >60 mL/min (ref >60)
Glucose, Bld: 93 mg/dL (ref 70–99)
Potassium: 4.6 mmol/L (ref 3.5–5.1)
Sodium: 137 mmol/L (ref 135–145)

## 2020-01-11 LAB — CBC
HCT: 31.9 % — ABNORMAL LOW (ref 36.0–46.0)
Hemoglobin: 9.8 g/dL — ABNORMAL LOW (ref 12.0–15.0)
MCH: 29.4 pg (ref 26.0–34.0)
MCHC: 30.7 g/dL (ref 30.0–36.0)
MCV: 95.8 fL (ref 80.0–100.0)
Platelets: 240 10*3/uL (ref 150–400)
RBC: 3.33 MIL/uL — ABNORMAL LOW (ref 3.87–5.11)
RDW: 13.2 % (ref 11.5–15.5)
WBC: 3.6 10*3/uL — ABNORMAL LOW (ref 4.0–10.5)
nRBC: 0 % (ref 0.0–0.2)

## 2020-01-11 MED ORDER — CEPHALEXIN 500 MG PO CAPS
500.0000 mg | ORAL_CAPSULE | Freq: Four times a day (QID) | ORAL | Status: AC
  Administered 2020-01-11 – 2020-01-13 (×9): 500 mg via ORAL
  Filled 2020-01-11 (×10): qty 1

## 2020-01-11 NOTE — Progress Notes (Signed)
PROGRESS NOTE    [name] [name]  OJJ:[id number] DOB: November 21, 1926 DOA: 01/07/2020 PCP: Glendon Axe, MD   Chief Complaint  Patient presents with  . Altered Mental Status  . Wellness Check  Brief Narrative:  89yof w/ no major chronic problems presented to the ED with altered mental status as per the report of daughter he appeared to be confused on the morning of admission, in the ED T 98.6  167/81  HR 62  RR 11. Exam by EDP unremarkable. Lab - CMet nl, Hg 10.8, U/A positive - hazy, moderate LE, >50 wbc/hpf. Covid NEGATIVE. Patient given rocephin IV. Patient's family felt she was too weak to return home. Patient was treated with iv antibiotics Was seen by physical therapy couples therapy and a skilled nursing facility has been advised and pending placement  Subjective: Seen this morning patient is alert awake resting comfortably.  No new complaints Afebrile overnight, routine labs are stable.  Assessment & Plan:  E. coli UTI, patient will complete 5 days of antibiotics, changed to oral as she lost IV access.. Afebrile no leukocytosis.  Chronic venous stasis dermatitis of both lower extremities: Continue supportive care.  Weakness/physical deconditioning inability to take care of himself he is awaiting for skilled nursing facility based on PT recommendation.  Normocytic anemia H&H stable  Nutrition: Diet Order            Diet regular Room service appropriate? Yes; Fluid consistency: Thin  Diet effective now                   Body mass index is 30.48 kg/m.   DVT prophylaxis: Place and maintain sequential compression device Start: 01/09/20 1004 enoxaparin (LOVENOX) injection 40 mg Start: 01/07/20 1800 Code Status:   Code Status: Full Code  Family Communication: plan of care discussed with patient at bedside.  Status is: Inpatien Remains inpatient appropriate because:Unsafe d/c plan   Dispo: The patient is from: Home              Anticipated d/c is to: SNF               Anticipated d/c date is: Once SNF bed available, in a day              Patient currently is medically stable to d/c.  Consultants:see note  Procedures:see note  Culture/Microbiology    Component Value Date/Time   SDES  01/07/2020 1457    URINE, RANDOM Performed at [facility], [address] Madelaine Bhat [place], [place] [address]    [facility]  01/07/2020 1457    NONE Performed at [facility], [address] [place],  [address]    CULT >=100,000 COLONIES/mL ESCHERICHIA COLI (A) 01/07/2020 1457   REPTSTATUS 01/10/2020 FINAL 01/07/2020 1457    Other culture-see note  Medications: Scheduled Meds: . enoxaparin (LOVENOX) injection  40 mg Subcutaneous Q24H  . sodium chloride flush  3 mL Intravenous Q12H   Continuous Infusions: . sodium chloride    . cefTRIAXone (ROCEPHIN)  IV 1 g (01/10/20 0944)    Antimicrobials: Anti-infectives (From admission, onward)   Start     Dose/Rate Route Frequency Ordered Stop   01/08/20 1000  cefTRIAXone (ROCEPHIN) 1 g in sodium chloride 0.9 % 100 mL IVPB        1 g 200 mL/hr over 30 Minutes Intravenous Every 24 hours 01/08/20 0801     01/07/20 2200  cefdinir (OMNICEF) capsule 300 mg  Status:  Discontinued  300 mg Oral Every 12 hours 01/07/20 1722 01/08/20 0801   01/07/20 1000  cefTRIAXone (ROCEPHIN) 1 g in sodium chloride 0.9 % 100 mL IVPB        1 g 200 mL/hr over 30 Minutes Intravenous  Once 01/07/20 0953 01/07/20 1341     Objective: Vitals: Today's Vitals   01/10/20 2006 01/10/20 2342 01/11/20 0520 01/11/20 0807  BP: (!) 146/63 (!) 139/55 (!) 154/59 (!) 149/55  Pulse: (!) 57 (!) 53 (!) 56 (!) 52  Resp: 16 15 15 15   Temp: 98.6 F (37 C) 97.9 F (36.6 C) 97.8 F (36.6 C) 98.2 F (36.8 C)  TempSrc:      SpO2: 96% 97% 100% 98%  Weight:      Height:      PainSc:  Asleep      Intake/Output Summary (Last 24 hours) at 01/11/2020 0901 Last data filed at 01/11/2020 0522 Gross per 24 hour  Intake -  Output  1000 ml  Net -1000 ml   Filed Weights   01/07/20 0824  Weight: 88.3 kg   Weight change:   Intake/Output from previous day: 12/31 0701 - 01/01 0700 In: -  Out: 1000 [Urine:1000] Intake/Output this shift: No intake/output data recorded.  Examination: General exam: AAO ,NAD, weak appearing. HEENT:Oral mucosa moist, Ear/Nose WNL grossly,dentition normal. Respiratory system: bilaterally clear,no wheezing or crackles,no use of accessory muscle, non tender. Cardiovascular system: S1 & S2 +, regular, No JVD. Gastrointestinal system: Abdomen soft, NT,ND, BS+. Nervous System:Alert, awake, moving extremities and grossly nonfocal Extremities: No edema, distal peripheral pulses palpable.  Skin: No rashes,no icterus. MSK: Normal muscle bulk,tone, power  Data Reviewed: I have personally reviewed following labs and imaging studies CBC: Recent Labs  Lab 01/07/20 0832 01/08/20 0836 01/10/20 0551 01/11/20 0500  WBC 4.0 4.1 4.0 3.6*  HGB 10.8* 10.2* 10.5* 9.8*  HCT 35.0* 32.6* 33.8* 31.9*  MCV 97.8 94.8 95.5 95.8  PLT 226 233 227 240   Basic Metabolic Panel: Recent Labs  Lab 01/07/20 0832 01/08/20 0836 01/10/20 0551 01/11/20 0500  NA 139 138 140 137  K 4.4 4.3 4.5 4.6  CL 107 104 108 105  CO2 23 27 27 27   GLUCOSE 81 86 99 93  BUN 18 16 17 18   CREATININE 0.93 0.84 0.97 0.84  CALCIUM 8.7* 8.9 8.3* 8.9   GFR: Estimated Creatinine Clearance: 49.8 mL/min (by C-G formula based on SCr of 0.84 mg/dL). Liver Function Tests: Recent Labs  Lab 01/07/20 0832  AST 19  ALT 9  ALKPHOS 64  BILITOT 1.1  PROT 6.8  ALBUMIN 3.3*   No results for input(s): LIPASE, AMYLASE in the last 168 hours. No results for input(s): AMMONIA in the last 168 hours. Coagulation Profile: No results for input(s): INR, PROTIME in the last 168 hours. Cardiac Enzymes: No results for input(s): CKTOTAL, CKMB, CKMBINDEX, TROPONINI in the last 168 hours. BNP (last 3 results) No results for input(s): PROBNP  in the last 8760 hours. HbA1C: No results for input(s): HGBA1C in the last 72 hours. CBG: No results for input(s): GLUCAP in the last 168 hours. Lipid Profile: No results for input(s): CHOL, HDL, LDLCALC, TRIG, CHOLHDL, LDLDIRECT in the last 72 hours. Thyroid Function Tests: No results for input(s): TSH, T4TOTAL, FREET4, T3FREE, THYROIDAB in the last 72 hours. Anemia Panel: No results for input(s): VITAMINB12, FOLATE, FERRITIN, TIBC, IRON, RETICCTPCT in the last 72 hours. Sepsis Labs: No results for input(s): PROCALCITON, LATICACIDVEN in the last 168 hours.  Recent Results (  from the past 240 hour(s))  Resp Panel by RT-PCR (Flu A&B, Covid) Nasopharyngeal Swab     Status: None   Collection Time: 01/07/20  8:32 AM   Specimen: Nasopharyngeal Swab; Nasopharyngeal(NP) swabs in vial transport medium  Result Value Ref Range Status   SARS Coronavirus 2 by RT PCR NEGATIVE NEGATIVE Final    Comment: (NOTE) SARS-CoV-2 target nucleic acids are NOT DETECTED.  The SARS-CoV-2 RNA is generally detectable in upper respiratory specimens during the acute phase of infection. The lowest concentration of SARS-CoV-2 viral copies this assay can detect is 138 copies/mL. A negative result does not preclude SARS-Cov-2 infection and should not be used as the sole basis for treatment or other patient management decisions. A negative result may occur with  improper specimen collection/handling, submission of specimen other than nasopharyngeal swab, presence of viral mutation(s) within the areas targeted by this assay, and inadequate number of viral copies(<138 copies/mL). A negative result must be combined with clinical observations, patient history, and epidemiological information. The expected result is Negative.  Fact Sheet for Patients:  BloggerCourse.com  Fact Sheet for Healthcare Providers:  SeriousBroker.it  This test is no t yet approved or cleared  by the [place] FDA and  has been authorized for detection and/or diagnosis of SARS-CoV-2 by FDA under an Emergency Use Authorization (EUA). This EUA will remain  in effect (meaning this test can be used) for the duration of the COVID-19 declaration under Section 564(b)(1) of the Act, 21 U.S.C.section 360bbb-3(b)(1), unless the authorization is terminated  or revoked sooner.       Influenza A by PCR NEGATIVE NEGATIVE Final   Influenza B by PCR NEGATIVE NEGATIVE Final    Comment: (NOTE) The Xpert Xpress SARS-CoV-2/FLU/RSV plus assay is intended as an aid in the diagnosis of influenza from Nasopharyngeal swab specimens and should not be used as a sole basis for treatment. Nasal washings and aspirates are unacceptable for Xpert Xpress SARS-CoV-2/FLU/RSV testing.  Fact Sheet for Patients: BloggerCourse.com  Fact Sheet for Healthcare Providers: SeriousBroker.it  This test is not yet approved or cleared by the [place] FDA and has been authorized for detection and/or diagnosis of SARS-CoV-2 by FDA under an Emergency Use Authorization (EUA). This EUA will remain in effect (meaning this test can be used) for the duration of the COVID-19 declaration under Section 564(b)(1) of the Act, 21 U.S.C. section 360bbb-3(b)(1), unless the authorization is terminated or revoked.  Performed at [facility], [address], [place], [place] [address]   Urine Culture     Status: Abnormal   Collection Time: 01/07/20  2:57 PM   Specimen: Urine, Random  Result Value Ref Range Status   Specimen Description   Final    URINE, RANDOM Performed at [facility], [address], [place], [place] [address]    Special Requests   Final    NONE Performed at [facility], [address] [place], [place] [address]    Culture >=100,000 COLONIES/mL ESCHERICHIA COLI (A)  Final   Report Status 01/10/2020 FINAL  Final    Organism ID, Bacteria ESCHERICHIA COLI (A)  Final      Susceptibility   Escherichia coli - MIC*    AMPICILLIN >=32 RESISTANT Resistant     CEFAZOLIN 16 SENSITIVE Sensitive     CEFEPIME <=0.12 SENSITIVE Sensitive     CEFTRIAXONE <=0.25 SENSITIVE Sensitive     CIPROFLOXACIN <=0.25 SENSITIVE Sensitive     GENTAMICIN <=1 SENSITIVE Sensitive     IMIPENEM <=  0.25 SENSITIVE Sensitive     NITROFURANTOIN <=16 SENSITIVE Sensitive     TRIMETH/SULFA <=20 SENSITIVE Sensitive     AMPICILLIN/SULBACTAM >=32 RESISTANT Resistant     PIP/TAZO <=4 SENSITIVE Sensitive     * >=100,000 COLONIES/mL ESCHERICHIA COLI  MRSA PCR Screening     Status: None   Collection Time: 01/09/20  9:01 PM   Specimen: Nasopharyngeal  Result Value Ref Range Status   MRSA by PCR NEGATIVE NEGATIVE Final    Comment:        The GeneXpert MRSA Assay (FDA approved for NASAL specimens only), is one component of a comprehensive MRSA colonization surveillance program. It is not intended to diagnose MRSA infection nor to guide or monitor treatment for MRSA infections. Performed at [facility], [address] [place], [place] [address]     Radiology Studies: No results found.   LOS: 1 day   Lanae Boast, MD [facility]  01/11/2020, 9:01 AM

## 2020-01-11 NOTE — Plan of Care (Signed)
Pt in bed . HOB elevated,Pt slept quietly most of night. Will continue to monitor,

## 2020-01-12 DIAGNOSIS — N390 Urinary tract infection, site not specified: Secondary | ICD-10-CM | POA: Diagnosis not present

## 2020-01-12 LAB — CBC
HCT: 36.7 % (ref 36.0–46.0)
Hemoglobin: 11.8 g/dL — ABNORMAL LOW (ref 12.0–15.0)
MCH: 30.1 pg (ref 26.0–34.0)
MCHC: 32.2 g/dL (ref 30.0–36.0)
MCV: 93.6 fL (ref 80.0–100.0)
Platelets: 270 10*3/uL (ref 150–400)
RBC: 3.92 MIL/uL (ref 3.87–5.11)
RDW: 13 % (ref 11.5–15.5)
WBC: 2.9 10*3/uL — ABNORMAL LOW (ref 4.0–10.5)
nRBC: 0 % (ref 0.0–0.2)

## 2020-01-12 LAB — BASIC METABOLIC PANEL
Anion gap: 6 (ref 5–15)
BUN: 20 mg/dL (ref 8–23)
CO2: 29 mmol/L (ref 22–32)
Calcium: 9.3 mg/dL (ref 8.9–10.3)
Chloride: 101 mmol/L (ref 98–111)
Creatinine, Ser: 0.88 mg/dL (ref 0.44–1.00)
GFR, Estimated: >60 mL/min (ref >60)
Glucose, Bld: 113 mg/dL — ABNORMAL HIGH (ref 70–99)
Potassium: 4.8 mmol/L (ref 3.5–5.1)
Sodium: 136 mmol/L (ref 135–145)

## 2020-01-12 NOTE — Plan of Care (Signed)
  Problem: Education: Goal: Knowledge of General Education information will improve Description: Including pain rating scale, medication(s)/side effects and non-pharmacologic comfort measures 01/12/2020 1315 by Ansel Bong, RN Outcome: Progressing 01/12/2020 1315 by Ansel Bong, RN Outcome: Progressing   Problem: Health Behavior/Discharge Planning: Goal: Ability to manage health-related needs will improve 01/12/2020 1315 by Ansel Bong, RN Outcome: Progressing 01/12/2020 1315 by Ansel Bong, RN Outcome: Progressing   Problem: Clinical Measurements: Goal: Ability to maintain clinical measurements within normal limits will improve 01/12/2020 1315 by Ansel Bong, RN Outcome: Progressing 01/12/2020 1315 by Ansel Bong, RN Outcome: Progressing Goal: Will remain free from infection 01/12/2020 1315 by Ansel Bong, RN Outcome: Progressing 01/12/2020 1315 by Ansel Bong, RN Outcome: Progressing Goal: Diagnostic test results will improve 01/12/2020 1315 by Ansel Bong, RN Outcome: Progressing 01/12/2020 1315 by Ansel Bong, RN Outcome: Progressing Goal: Respiratory complications will improve 01/12/2020 1315 by Ansel Bong, RN Outcome: Progressing 01/12/2020 1315 by Ansel Bong, RN Outcome: Progressing Goal: Cardiovascular complication will be avoided 01/12/2020 1315 by Ansel Bong, RN Outcome: Progressing 01/12/2020 1315 by Ansel Bong, RN Outcome: Progressing   Problem: Activity: Goal: Risk for activity intolerance will decrease 01/12/2020 1315 by Ansel Bong, RN Outcome: Progressing 01/12/2020 1315 by Ansel Bong, RN Outcome: Progressing   Problem: Nutrition: Goal: Adequate nutrition will be maintained 01/12/2020 1315 by Ansel Bong, RN Outcome: Progressing 01/12/2020 1315 by Ansel Bong, RN Outcome: Progressing   Problem: Coping: Goal: Level of anxiety will decrease 01/12/2020 1315 by Ansel Bong, RN Outcome: Progressing 01/12/2020 1315 by  Ansel Bong, RN Outcome: Progressing   Problem: Elimination: Goal: Will not experience complications related to bowel motility 01/12/2020 1315 by Ansel Bong, RN Outcome: Progressing 01/12/2020 1315 by Ansel Bong, RN Outcome: Progressing Goal: Will not experience complications related to urinary retention 01/12/2020 1315 by Ansel Bong, RN Outcome: Progressing 01/12/2020 1315 by Ansel Bong, RN Outcome: Progressing   Problem: Pain Managment: Goal: General experience of comfort will improve 01/12/2020 1315 by Ansel Bong, RN Outcome: Progressing 01/12/2020 1315 by Ansel Bong, RN Outcome: Progressing   Problem: Safety: Goal: Ability to remain free from injury will improve 01/12/2020 1315 by Ansel Bong, RN Outcome: Progressing 01/12/2020 1315 by Ansel Bong, RN Outcome: Progressing   Problem: Skin Integrity: Goal: Risk for impaired skin integrity will decrease 01/12/2020 1315 by Ansel Bong, RN Outcome: Progressing 01/12/2020 1315 by Ansel Bong, RN Outcome: Progressing   Problem: Urinary Elimination: Goal: Signs and symptoms of infection will decrease 01/12/2020 1315 by Ansel Bong, RN Outcome: Progressing 01/12/2020 1315 by Ansel Bong, RN Outcome: Progressing

## 2020-01-12 NOTE — Progress Notes (Signed)
PROGRESS NOTE    [name] [name]  WCB:[id number] DOB: 23-Nov-1926 DOA: 01/07/2020 PCP: Glendon Axe, MD   Chief Complaint  Patient presents with  . Altered Mental Status  . Wellness Check  Brief Narrative:  89yof w/ no major chronic problems presented to the ED with altered mental status as per the report of daughter he appeared to be confused on the morning of admission, in the ED T 98.6  167/81  HR 62  RR 11. Exam by EDP unremarkable. Lab - CMet nl, Hg 10.8, U/A positive - hazy, moderate LE, >50 wbc/hpf. Covid NEGATIVE. Patient given rocephin IV. Patient's family felt she was too weak to return home. Patient was treated with iv antibiotics Was seen by PT/OT-skilled nursing facility has been advised and pending placement  Subjective: Alert awake oriented . Afebrile resting comfortably.  Has no new complaints.    Assessment & Plan:  E. coli UTI, continue Keflex to complete the course.  Patient afebrile and clinically stable.  Chronic venous stasis dermatitis of both lower extremities: Continue supportive care.  Weakness/physical deconditioning inability to take care of herself -awaiting for skilled nursing facility based on PT recommendation.  Normocytic anemia H&H stable  Nutrition: Diet Order            Diet regular Room service appropriate? Yes; Fluid consistency: Thin  Diet effective now                   Body mass index is 30.48 kg/m.   DVT prophylaxis: Place and maintain sequential compression device Start: 01/09/20 1004 enoxaparin (LOVENOX) injection 40 mg Start: 01/07/20 1800 Code Status:   Code Status: Full Code  Family Communication: plan of care discussed with patient at bedside.  Status is: Inpatien Remains inpatient appropriate because:Unsafe d/c plan   Dispo: The patient is from: Home              Anticipated d/c is to: SNF              Anticipated d/c date is: Once SNF bed available.              Patient currently is medically stable to  d/c.  Consultants:see note  Procedures:see note  Culture/Microbiology    Component Value Date/Time   SDES  01/07/2020 1457    URINE, RANDOM Performed at [facility], [address] [place], [place] [address]    [facility]  01/07/2020 1457    NONE Performed at [facility], [address] [place], [place] [address]    CULT >=100,000 COLONIES/mL ESCHERICHIA COLI (A) 01/07/2020 1457   REPTSTATUS 01/10/2020 FINAL 01/07/2020 1457    Other culture-see note  Medications: Scheduled Meds: . cephALEXin  500 mg Oral QID  . enoxaparin (LOVENOX) injection  40 mg Subcutaneous Q24H  . sodium chloride flush  3 mL Intravenous Q12H   Continuous Infusions: . sodium chloride      Antimicrobials: Anti-infectives (From admission, onward)   Start     Dose/Rate Route Frequency Ordered Stop   01/11/20 1800  cephALEXin (KEFLEX) capsule 500 mg        500 mg Oral 4 times daily 01/11/20 1436     01/08/20 1000  cefTRIAXone (ROCEPHIN) 1 g in sodium chloride 0.9 % 100 mL IVPB  Status:  Discontinued        1 g 200 mL/hr over 30 Minutes Intravenous Every 24 hours 01/08/20 0801 01/11/20 1436   01/07/20 2200  cefdinir (OMNICEF) capsule 300 mg  Status:  Discontinued        300 mg Oral Every 12 hours 01/07/20 1722 01/08/20 0801   01/07/20 1000  cefTRIAXone (ROCEPHIN) 1 g in sodium chloride 0.9 % 100 mL IVPB        1 g 200 mL/hr over 30 Minutes Intravenous  Once 01/07/20 0953 01/07/20 1341     Objective: Vitals: Today's Vitals   01/12/20 0412 01/12/20 0814 01/12/20 1145 01/12/20 1300  BP: (!) 156/44 (!) 159/81 (!) 184/88   Pulse: (!) 50 (!) 57 67   Resp: 18 16 16    Temp: 97.6 F (36.4 C) 97.9 F (36.6 C) 98.4 F (36.9 C)   TempSrc:      SpO2: 94% 100% 100%   Weight:      Height:      PainSc: 0-No pain   0-No pain    Intake/Output Summary (Last 24 hours) at 01/12/2020 1331 Last data filed at 01/12/2020 0930 Gross per 24 hour  Intake -  Output 1300 ml  Net -1300 ml    Filed Weights   01/07/20 0824  Weight: 88.3 kg   Weight change:   Intake/Output from previous day: 01/01 0701 - 01/02 0700 In: -  Out: 1200 [Urine:1200] Intake/Output this shift: Total I/O In: -  Out: 600 [Urine:600]  Examination: General exam: AAO,elderly,NAD, weak appearing. HEENT:Oral mucosa moist, Ear/Nose WNL grossly, dentition normal. Respiratory system: bilaterally clear,no wheezing or crackles,no use of accessory muscle Cardiovascular system: S1 & S2 +, No JVD,. Gastrointestinal system: Abdomen soft, NT,ND, BS+ Nervous System:Alert, awake, moving extremities and grossly nonfocal Extremities: No edema, distal peripheral pulses palpable.  Skin: No rashes,no icterus. MSK: Normal muscle bulk,tone, power  Data Reviewed: I have personally reviewed following labs and imaging studies CBC: Recent Labs  Lab 01/07/20 0832 01/08/20 0836 01/10/20 0551 01/11/20 0500 01/12/20 0850  WBC 4.0 4.1 4.0 3.6* 2.9*  HGB 10.8* 10.2* 10.5* 9.8* 11.8*  HCT 35.0* 32.6* 33.8* 31.9* 36.7  MCV 97.8 94.8 95.5 95.8 93.6  PLT 226 233 227 240 270   Basic Metabolic Panel: Recent Labs  Lab 01/07/20 0832 01/08/20 0836 01/10/20 0551 01/11/20 0500 01/12/20 0850  NA 139 138 140 137 136  K 4.4 4.3 4.5 4.6 4.8  CL 107 104 108 105 101  CO2 23 27 27 27 29   GLUCOSE 81 86 99 93 113*  BUN 18 16 17 18 20   CREATININE 0.93 0.84 0.97 0.84 0.88  CALCIUM 8.7* 8.9 8.3* 8.9 9.3   GFR: Estimated Creatinine Clearance: 47.5 mL/min (by C-G formula based on SCr of 0.88 mg/dL). Liver Function Tests: Recent Labs  Lab 01/07/20 0832  AST 19  ALT 9  ALKPHOS 64  BILITOT 1.1  PROT 6.8  ALBUMIN 3.3*   No results for input(s): LIPASE, AMYLASE in the last 168 hours. No results for input(s): AMMONIA in the last 168 hours. Coagulation Profile: No results for input(s): INR, PROTIME in the last 168 hours. Cardiac Enzymes: No results for input(s): CKTOTAL, CKMB, CKMBINDEX, TROPONINI in the last 168  hours. BNP (last 3 results) No results for input(s): PROBNP in the last 8760 hours. HbA1C: No results for input(s): HGBA1C in the last 72 hours. CBG: No results for input(s): GLUCAP in the last 168 hours. Lipid Profile: No results for input(s): CHOL, HDL, LDLCALC, TRIG, CHOLHDL, LDLDIRECT in the last 72 hours. Thyroid Function Tests: No results for input(s): TSH, T4TOTAL, FREET4, T3FREE, THYROIDAB in the last 72 hours. Anemia Panel: No results for input(s): VITAMINB12, FOLATE, FERRITIN, TIBC, IRON,  RETICCTPCT in the last 72 hours. Sepsis Labs: No results for input(s): PROCALCITON, LATICACIDVEN in the last 168 hours.  Recent Results (from the past 240 hour(s))  Resp Panel by RT-PCR (Flu A&B, Covid) Nasopharyngeal Swab     Status: None   Collection Time: 01/07/20  8:32 AM   Specimen: Nasopharyngeal Swab; Nasopharyngeal(NP) swabs in vial transport medium  Result Value Ref Range Status   SARS Coronavirus 2 by RT PCR NEGATIVE NEGATIVE Final    Comment: (NOTE) SARS-CoV-2 target nucleic acids are NOT DETECTED.  The SARS-CoV-2 RNA is generally detectable in upper respiratory specimens during the acute phase of infection. The lowest concentration of SARS-CoV-2 viral copies this assay can detect is 138 copies/mL. A negative result does not preclude SARS-Cov-2 infection and should not be used as the sole basis for treatment or other patient management decisions. A negative result may occur with  improper specimen collection/handling, submission of specimen other than nasopharyngeal swab, presence of viral mutation(s) within the areas targeted by this assay, and inadequate number of viral copies(<138 copies/mL). A negative result must be combined with clinical observations, patient history, and epidemiological information. The expected result is Negative.  Fact Sheet for Patients:  BloggerCourse.com  Fact Sheet for Healthcare Providers:   SeriousBroker.it  This test is no t yet approved or cleared by the [place] FDA and  has been authorized for detection and/or diagnosis of SARS-CoV-2 by FDA under an Emergency Use Authorization (EUA). This EUA will remain  in effect (meaning this test can be used) for the duration of the COVID-19 declaration under Section 564(b)(1) of the Act, 21 U.S.C.section 360bbb-3(b)(1), unless the authorization is terminated  or revoked sooner.       Influenza A by PCR NEGATIVE NEGATIVE Final   Influenza B by PCR NEGATIVE NEGATIVE Final    Comment: (NOTE) The Xpert Xpress SARS-CoV-2/FLU/RSV plus assay is intended as an aid in the diagnosis of influenza from Nasopharyngeal swab specimens and should not be used as a sole basis for treatment. Nasal washings and aspirates are unacceptable for Xpert Xpress SARS-CoV-2/FLU/RSV testing.  Fact Sheet for Patients: BloggerCourse.com  Fact Sheet for Healthcare Providers: SeriousBroker.it  This test is not yet approved or cleared by the [place] FDA and has been authorized for detection and/or diagnosis of SARS-CoV-2 by FDA under an Emergency Use Authorization (EUA). This EUA will remain in effect (meaning this test can be used) for the duration of the COVID-19 declaration under Section 564(b)(1) of the Act, 21 U.S.C. section 360bbb-3(b)(1), unless the authorization is terminated or revoked.  Performed at [facility], [address] [place], [place] [address]   Urine Culture     Status: Abnormal   Collection Time: 01/07/20  2:57 PM   Specimen: Urine, Random  Result Value Ref Range Status   Specimen Description   Final    URINE, RANDOM Performed at [facility], [address], [place], [place] [address]    Special Requests   Final    NONE Performed at [facility], [address] [place], [place] [address]    Culture  >=100,000 COLONIES/mL ESCHERICHIA COLI (A)  Final   Report Status 01/10/2020 FINAL  Final   Organism ID, Bacteria ESCHERICHIA COLI (A)  Final      Susceptibility   Escherichia coli - MIC*    AMPICILLIN >=32 RESISTANT Resistant     CEFAZOLIN 16 SENSITIVE Sensitive     CEFEPIME <=0.12 SENSITIVE Sensitive     CEFTRIAXONE <=0.25 SENSITIVE  Sensitive     CIPROFLOXACIN <=0.25 SENSITIVE Sensitive     GENTAMICIN <=1 SENSITIVE Sensitive     IMIPENEM <=0.25 SENSITIVE Sensitive     NITROFURANTOIN <=16 SENSITIVE Sensitive     TRIMETH/SULFA <=20 SENSITIVE Sensitive     AMPICILLIN/SULBACTAM >=32 RESISTANT Resistant     PIP/TAZO <=4 SENSITIVE Sensitive     * >=100,000 COLONIES/mL ESCHERICHIA COLI  MRSA PCR Screening     Status: None   Collection Time: 01/09/20  9:01 PM   Specimen: Nasopharyngeal  Result Value Ref Range Status   MRSA by PCR NEGATIVE NEGATIVE Final    Comment:        The GeneXpert MRSA Assay (FDA approved for NASAL specimens only), is one component of a comprehensive MRSA colonization surveillance program. It is not intended to diagnose MRSA infection nor to guide or monitor treatment for MRSA infections. Performed at [facility], [address], [place], [place] [address]     Radiology Studies: No results found.   LOS: 2 days   Lanae Boast, MD [facility]  01/12/2020, 1:31 PM

## 2020-01-13 DIAGNOSIS — N390 Urinary tract infection, site not specified: Secondary | ICD-10-CM | POA: Diagnosis not present

## 2020-01-13 LAB — BASIC METABOLIC PANEL
Anion gap: 7 (ref 5–15)
BUN: 27 mg/dL — ABNORMAL HIGH (ref 8–23)
CO2: 28 mmol/L (ref 22–32)
Calcium: 9.1 mg/dL (ref 8.9–10.3)
Chloride: 101 mmol/L (ref 98–111)
Creatinine, Ser: 0.88 mg/dL (ref 0.44–1.00)
GFR, Estimated: >60 mL/min (ref >60)
Glucose, Bld: 90 mg/dL (ref 70–99)
Potassium: 4.5 mmol/L (ref 3.5–5.1)
Sodium: 136 mmol/L (ref 135–145)

## 2020-01-13 LAB — CBC
HCT: 37.2 % (ref 36.0–46.0)
Hemoglobin: 11.6 g/dL — ABNORMAL LOW (ref 12.0–15.0)
MCH: 29.6 pg (ref 26.0–34.0)
MCHC: 31.2 g/dL (ref 30.0–36.0)
MCV: 94.9 fL (ref 80.0–100.0)
Platelets: 273 10*3/uL (ref 150–400)
RBC: 3.92 MIL/uL (ref 3.87–5.11)
RDW: 12.7 % (ref 11.5–15.5)
WBC: 2.9 10*3/uL — ABNORMAL LOW (ref 4.0–10.5)
nRBC: 0 % (ref 0.0–0.2)

## 2020-01-13 NOTE — Progress Notes (Signed)
PROGRESS NOTE    [name] [name]  NFA:[id number] DOB: Aug 19, 1926 DOA: 01/07/2020 PCP: Glendon Axe, MD   Chief Complaint  Patient presents with  . Altered Mental Status  . Wellness Check  Brief Narrative:  89yof w/ no major chronic problems presented to the ED with altered mental status as per the report of daughter he appeared to be confused on the morning of admission, in the ED T 98.6  167/81  HR 62  RR 11. Exam by EDP unremarkable. Lab - CMet nl, Hg 10.8, U/A positive - hazy, moderate LE, >50 wbc/hpf. Covid NEGATIVE. Patient given rocephin IV. Patient's family felt she was too weak to return home. Patient was treated with iv antibiotics Was seen by PT/OT-skilled nursing facility has been advised and pending placement  Subjective: Resting comfortably.  She is alert and awake.   No new complaints. Assessment & Plan:  E. coli UTI, complete 7 days of antibiotics today.  Afebrile and clinically stable.    Chronic venous stasis dermatitis of both lower extremities: Continue supportive care.  Weakness/physical deconditioning inability to take care of herself -continue to work with PT OT.  She is awaiting on placement family has chosen to place insurance authorization started today   Normocytic anemia H&H stable  Nutrition: Diet Order            Diet regular Room service appropriate? Yes; Fluid consistency: Thin  Diet effective now                   Body mass index is 30.48 kg/m.   DVT prophylaxis: Place and maintain sequential compression device Start: 01/09/20 1004 enoxaparin (LOVENOX) injection 40 mg Start: 01/07/20 1800 Code Status:   Code Status: Full Code  Family Communication: plan of care discussed with patient at bedside.  I discussed with case management.  Status is: Inpatien Remains inpatient appropriate because:Unsafe d/c plan   Dispo: The patient is from: Home              Anticipated d/c is to: SNF              Anticipated d/c date is: Once SNF bed  available.              Patient currently is medically stable to d/c.  Consultants:see note  Procedures:see note  Culture/Microbiology    Component Value Date/Time   SDES  01/07/2020 1457    URINE, RANDOM Performed at [facility], [address] [place], [place] [address]    [facility]  01/07/2020 1457    NONE Performed at [facility], [address] [place],  [place] [address]    CULT >=100,000 COLONIES/mL ESCHERICHIA COLI (A) 01/07/2020 1457   REPTSTATUS 01/10/2020 FINAL 01/07/2020 1457    Other culture-see note  Medications: Scheduled Meds: . cephALEXin  500 mg Oral QID  . enoxaparin (LOVENOX) injection  40 mg Subcutaneous Q24H  . sodium chloride flush  3 mL Intravenous Q12H   Continuous Infusions: . sodium chloride      Antimicrobials: Anti-infectives (From admission, onward)   Start     Dose/Rate Route Frequency Ordered Stop   01/11/20 1800  cephALEXin (KEFLEX) capsule 500 mg        500 mg Oral 4 times daily 01/11/20 1436 01/13/20 2359   01/08/20 1000  cefTRIAXone (ROCEPHIN) 1 g in sodium chloride 0.9 % 100 mL IVPB  Status:  Discontinued        1 g 200 mL/hr over 30 Minutes Intravenous Every  24 hours 01/08/20 0801 01/11/20 1436   01/07/20 2200  cefdinir (OMNICEF) capsule 300 mg  Status:  Discontinued        300 mg Oral Every 12 hours 01/07/20 1722 01/08/20 0801   01/07/20 1000  cefTRIAXone (ROCEPHIN) 1 g in sodium chloride 0.9 % 100 mL IVPB        1 g 200 mL/hr over 30 Minutes Intravenous  Once 01/07/20 0953 01/07/20 1341     Objective: Vitals: Today's Vitals   01/12/20 2330 01/13/20 0438 01/13/20 0757 01/13/20 1155  BP:  (!) 155/70 (!) 155/69 (!) 147/99  Pulse:  (!) 58 (!) 55 65  Resp:  18 15 16   Temp:  97.8 F (36.6 C) 97.6 F (36.4 C) 98.1 F (36.7 C)  TempSrc:  Oral    SpO2:  100% 100% 100%  Weight:      Height:      PainSc: Asleep 0-No pain      Intake/Output Summary (Last 24 hours) at 01/13/2020 1157 Last data filed at  01/13/2020 1020 Gross per 24 hour  Intake 440 ml  Output 2600 ml  Net -2160 ml   Filed Weights   01/07/20 0824  Weight: 88.3 kg   Weight change:   Intake/Output from previous day: 01/02 0701 - 01/03 0700 In: 440 [P.O.:440] Out: 2550 [Urine:2550] Intake/Output this shift: Total I/O In: -  Out: 650 [Urine:650]  Examination: General exam: AAO, NAD, weak appearing. HEENT:Oral mucosa moist, Ear/Nose WNL grossly, dentition normal. Respiratory system: bilaterally clear,no wheezing or crackles,no use of accessory muscle Cardiovascular system: S1 & S2 +, No JVD,. Gastrointestinal system: Abdomen soft, NT,ND, BS+ Nervous System:Alert, awake, moving extremities and grossly nonfocal Extremities: No edema, distal peripheral pulses palpable.  Skin: No rashes,no icterus. MSK: Normal muscle bulk,tone, power  Data Reviewed: I have personally reviewed following labs and imaging studies CBC: Recent Labs  Lab 01/08/20 0836 01/10/20 0551 01/11/20 0500 01/12/20 0850 01/13/20 0904  WBC 4.1 4.0 3.6* 2.9* 2.9*  HGB 10.2* 10.5* 9.8* 11.8* 11.6*  HCT 32.6* 33.8* 31.9* 36.7 37.2  MCV 94.8 95.5 95.8 93.6 94.9  PLT 233 227 240 270 273   Basic Metabolic Panel: Recent Labs  Lab 01/08/20 0836 01/10/20 0551 01/11/20 0500 01/12/20 0850 01/13/20 0904  NA 138 140 137 136 136  K 4.3 4.5 4.6 4.8 4.5  CL 104 108 105 101 101  CO2 27 27 27 29 28   GLUCOSE 86 99 93 113* 90  BUN 16 17 18 20  27*  CREATININE 0.84 0.97 0.84 0.88 0.88  CALCIUM 8.9 8.3* 8.9 9.3 9.1   GFR: Estimated Creatinine Clearance: 47.5 mL/min (by C-G formula based on SCr of 0.88 mg/dL). Liver Function Tests: Recent Labs  Lab 01/07/20 0832  AST 19  ALT 9  ALKPHOS 64  BILITOT 1.1  PROT 6.8  ALBUMIN 3.3*   No results for input(s): LIPASE, AMYLASE in the last 168 hours. No results for input(s): AMMONIA in the last 168 hours. Coagulation Profile: No results for input(s): INR, PROTIME in the last 168 hours. Cardiac  Enzymes: No results for input(s): CKTOTAL, CKMB, CKMBINDEX, TROPONINI in the last 168 hours. BNP (last 3 results) No results for input(s): PROBNP in the last 8760 hours. HbA1C: No results for input(s): HGBA1C in the last 72 hours. CBG: No results for input(s): GLUCAP in the last 168 hours. Lipid Profile: No results for input(s): CHOL, HDL, LDLCALC, TRIG, CHOLHDL, LDLDIRECT in the last 72 hours. Thyroid Function Tests: No results for input(s): TSH,  T4TOTAL, FREET4, T3FREE, THYROIDAB in the last 72 hours. Anemia Panel: No results for input(s): VITAMINB12, FOLATE, FERRITIN, TIBC, IRON, RETICCTPCT in the last 72 hours. Sepsis Labs: No results for input(s): PROCALCITON, LATICACIDVEN in the last 168 hours.  Recent Results (from the past 240 hour(s))  Resp Panel by RT-PCR (Flu A&B, Covid) Nasopharyngeal Swab     Status: None   Collection Time: 01/07/20  8:32 AM   Specimen: Nasopharyngeal Swab; Nasopharyngeal(NP) swabs in vial transport medium  Result Value Ref Range Status   SARS Coronavirus 2 by RT PCR NEGATIVE NEGATIVE Final    Comment: (NOTE) SARS-CoV-2 target nucleic acids are NOT DETECTED.  The SARS-CoV-2 RNA is generally detectable in upper respiratory specimens during the acute phase of infection. The lowest concentration of SARS-CoV-2 viral copies this assay can detect is 138 copies/mL. A negative result does not preclude SARS-Cov-2 infection and should not be used as the sole basis for treatment or other patient management decisions. A negative result may occur with  improper specimen collection/handling, submission of specimen other than nasopharyngeal swab, presence of viral mutation(s) within the areas targeted by this assay, and inadequate number of viral copies(<138 copies/mL). A negative result must be combined with clinical observations, patient history, and epidemiological information. The expected result is Negative.  Fact Sheet for Patients:   BloggerCourse.com  Fact Sheet for Healthcare Providers:  SeriousBroker.it  This test is no t yet approved or cleared by the [place] FDA and  has been authorized for detection and/or diagnosis of SARS-CoV-2 by FDA under an Emergency Use Authorization (EUA). This EUA will remain  in effect (meaning this test can be used) for the duration of the COVID-19 declaration under Section 564(b)(1) of the Act, 21 U.S.C.section 360bbb-3(b)(1), unless the authorization is terminated  or revoked sooner.       Influenza A by PCR NEGATIVE NEGATIVE Final   Influenza B by PCR NEGATIVE NEGATIVE Final    Comment: (NOTE) The Xpert Xpress SARS-CoV-2/FLU/RSV plus assay is intended as an aid in the diagnosis of influenza from Nasopharyngeal swab specimens and should not be used as a sole basis for treatment. Nasal washings and aspirates are unacceptable for Xpert Xpress SARS-CoV-2/FLU/RSV testing.  Fact Sheet for Patients: BloggerCourse.com  Fact Sheet for Healthcare Providers: SeriousBroker.it  This test is not yet approved or cleared by the [place] FDA and has been authorized for detection and/or diagnosis of SARS-CoV-2 by FDA under an Emergency Use Authorization (EUA). This EUA will remain in effect (meaning this test can be used) for the duration of the COVID-19 declaration under Section 564(b)(1) of the Act, 21 U.S.C. section 360bbb-3(b)(1), unless the authorization is terminated or revoked.  Performed at [facility], [address], [place], [place] [address]   Urine Culture     Status: Abnormal   Collection Time: 01/07/20  2:57 PM   Specimen: Urine, Random  Result Value Ref Range Status   Specimen Description   Final    URINE, RANDOM Performed at [facility], [address], [place], [place] [address]    Special Requests   Final    NONE Performed at  [facility], [address] [place], [place] [address]    Culture >=100,000 COLONIES/mL ESCHERICHIA COLI (A)  Final   Report Status 01/10/2020 FINAL  Final   Organism ID, Bacteria ESCHERICHIA COLI (A)  Final      Susceptibility   Escherichia coli - MIC*    AMPICILLIN >=32 RESISTANT Resistant  CEFAZOLIN 16 SENSITIVE Sensitive     CEFEPIME <=0.12 SENSITIVE Sensitive     CEFTRIAXONE <=0.25 SENSITIVE Sensitive     CIPROFLOXACIN <=0.25 SENSITIVE Sensitive     GENTAMICIN <=1 SENSITIVE Sensitive     IMIPENEM <=0.25 SENSITIVE Sensitive     NITROFURANTOIN <=16 SENSITIVE Sensitive     TRIMETH/SULFA <=20 SENSITIVE Sensitive     AMPICILLIN/SULBACTAM >=32 RESISTANT Resistant     PIP/TAZO <=4 SENSITIVE Sensitive     * >=100,000 COLONIES/mL ESCHERICHIA COLI  MRSA PCR Screening     Status: None   Collection Time: 01/09/20  9:01 PM   Specimen: Nasopharyngeal  Result Value Ref Range Status   MRSA by PCR NEGATIVE NEGATIVE Final    Comment:        The GeneXpert MRSA Assay (FDA approved for NASAL specimens only), is one component of a comprehensive MRSA colonization surveillance program. It is not intended to diagnose MRSA infection nor to guide or monitor treatment for MRSA infections. Performed at [facility], [address], [place], [place] [address]     Radiology Studies: No results found.   LOS: 3 days   Lanae Boast, MD [facility]  01/13/2020, 11:57 AM

## 2020-01-13 NOTE — Progress Notes (Signed)
Physical Therapy Treatment Patient Details Name: [name] [name] MRN: [id number] DOB: [birth year] Today's Date: 01/13/2020    History of Present Illness 87 y.o. female with medical history significant of no major chronic medical problems.  She presents to the emergency department for altered mental status.  According to EMS report the daughter spoke to the patient this morning and she appeared to be confused.  Here the patient is awake and alert.  Oriented to person and place but not time.  Patient denies any symptoms.  Per report however the home health aide recently tested positive for Covid.  Patient denies any shortness of breath or cough.  States she feels "lousy" states she feels extremely weak    PT Comments    Pt alert, agreeable to PT with encouragement and needed facilitation of all tasks today. No pain signs/symptoms. Pt seemed disoriented to situation and place throughout, often speaking of "that boy" or "baby over there", re-oriented by PT as able. Supine to sit modA for LE management and trunk elevation. Sit <> stand with RW and 2+ assist attempted 3 times, unable to come into fully standing, though third attempt pt with improved participation, bilaterally feet blocked to prevent slippage. The patient would benefit from further skilled PT intervention to continue to progress towards goals. Recommendation remains appropriate.     Follow Up Recommendations  SNF;Supervision for mobility/OOB     Equipment Recommendations  None recommended by PT    Recommendations for Other Services       Precautions / Restrictions Precautions Precautions: Fall Restrictions Weight Bearing Restrictions: No    Mobility  Bed Mobility Overal bed mobility: Needs Assistance Bed Mobility: Supine to Sit Rolling: Mod assist         General bed mobility comments: Pt needed facilitation of mobility. modA to move LEs, and then trunk elevation  Transfers Overall transfer level: Needs  assistance Equipment used: Rolling walker (2 wheeled) Transfers: Sit to/from Stand Sit to Stand: Total assist;+2 physical assistance         General transfer comment: attempted 3 times, pt with improved participation for 3rd attempt but still unable to come into fully standing. feet blocked bilaterally to prevent slipping  Ambulation/Gait             General Gait Details: unable   Stairs             Wheelchair Mobility    Modified Rankin (Stroke Patients Only)       Balance Overall balance assessment: Needs assistance Sitting-balance support: Feet supported;Bilateral upper extremity supported Sitting balance-Leahy Scale: Fair Sitting balance - Comments: CGA close supervision for seated balance                                    Cognition Arousal/Alertness: Awake/alert Behavior During Therapy: WFL for tasks assessed/performed Overall Cognitive Status: Impaired/Different from baseline Area of Impairment: Orientation                 Orientation Level: Disoriented to;Place;Time;Situation             General Comments: son reports via phone that she "sometimes knows things like the date and sometimes she doesn't". Pt reports she ambulates at home but son reports transfers to wheelchair only.      Exercises General Exercises - Lower Extremity Long Arc Quad: AROM;Strengthening;Both;10 reps    General Comments        Pertinent Vitals/Pain Pain  Assessment: No/denies pain    Home Living                      Prior Function            PT Goals (current goals can now be found in the care plan section) Progress towards PT goals: Progressing toward goals (slowly)    Frequency    Min 2X/week      PT Plan Current plan remains appropriate    Co-evaluation              AM-PAC PT "6 Clicks" Mobility   Outcome Measure  Help needed turning from your back to your side while in a flat bed without using bedrails?:  A Little Help needed moving from lying on your back to sitting on the side of a flat bed without using bedrails?: A Little Help needed moving to and from a bed to a chair (including a wheelchair)?: Total Help needed standing up from a chair using your arms (e.g., wheelchair or bedside chair)?: Total Help needed to walk in hospital room?: Total Help needed climbing 3-5 steps with a railing? : Total 6 Click Score: 10    End of Session Equipment Utilized During Treatment: Gait belt Activity Tolerance: Patient limited by fatigue;Patient tolerated treatment well Patient left: in bed;with call bell/phone within reach;with bed alarm set Nurse Communication: Mobility status PT Visit Diagnosis: Other abnormalities of gait and mobility (R26.89);Muscle weakness (generalized) (M62.81)     Time: 4034-7425 PT Time Calculation (min) (ACUTE ONLY): 25 min  Charges:  $Therapeutic Exercise: 23-37 mins                     Olga Coaster PT, DPT 1:52 PM,01/13/20

## 2020-01-13 NOTE — TOC Progression Note (Signed)
Transition of Care [facility]) - Progression Note    Patient Details  Name: [name] [name] MRN: [id number] Date of Birth: Aug 17, 1926  Transition of [facility]) CM/SW Contact  Trenton Founds, RN Phone Number: 01/13/2020, 9:07 AM  Clinical Narrative:   RNCM reached out to patient's grandson [name] [name] to discuss bed offer of [facility]. [name] [name] is agreeable to accept bed offer and questions when patient might be able to go. Discussed that facility will be notified of acceptance and they will need to get insurance authorization and this could take a day or two.  RNCM reached out to [facility] with [facility] to notify her that bed had been accepted and to start authorization.     Expected Discharge Plan: Skilled Nursing Facility Barriers to Discharge: No Barriers Identified  Expected Discharge Plan and Services Expected Discharge Plan: Skilled Nursing Facility     Post Acute Care Choice: Skilled Nursing Facility Living arrangements for the past 2 months: Single Family Home                                       Social Determinants of Health (SDOH) Interventions    Readmission Risk Interventions No flowsheet data found.

## 2020-01-13 NOTE — Progress Notes (Signed)
Occupational Therapy Treatment Patient Details Name: [name] [name] MRN: [id number] DOB: 07/07/1926 Today's Date: 01/13/2020    History of present illness 87 y.o. female with medical history significant of no major chronic medical problems.  She presents to the emergency department for altered mental status.  According to EMS report the daughter spoke to the patient this morning and she appeared to be confused.  Here the patient is awake and alert.  Oriented to person and place but not time.  Patient denies any symptoms.  Per report however the home health aide recently tested positive for Covid.  Patient denies any shortness of breath or cough.  States she feels "lousy" states she feels extremely weak   OT comments  Upon entering the room, pt supine in bed and sleeping soundly. Pt opening eyes once and then closing quickly. Pt in poor position in bed and OT attempting to reposition for comfort with max -total A as pt offers no assistance. Pt grimacing with therapist repositioning and unable to verbalize pain location.  OT placing warm wash cloth over pt's face with her initiating no movement to remove it from her face even after 2 minutes with max cuing. Hand over hand assistance to wash face. Pt remains lethargic at this time with bed alarm activated and session ending. Call bell within reach.    Follow Up Recommendations  SNF;Supervision/Assistance - 24 hour    Equipment Recommendations  Other (comment) (defer to next venue of care)       Precautions / Restrictions Precautions Precautions: Fall Restrictions Weight Bearing Restrictions: No       Mobility Bed Mobility Overal bed mobility: Needs Assistance Bed Mobility: Rolling Rolling: Max assist;Total assist         General bed mobility comments: Pt needing total A for repositioning in the bed with pt making no effort to assist  Transfers Overall transfer level: Needs assistance Equipment used: Rolling walker (2  wheeled) Transfers: Sit to/from Stand Sit to Stand: Total assist;+2 physical assistance         General transfer comment: attempted 3 times, pt with improved participation for 3rd attempt but still unable to come into fully standing. feet blocked bilaterally to prevent slipping    Balance Overall balance assessment: Needs assistance Sitting-balance support: Feet supported;Bilateral upper extremity supported Sitting balance-Leahy Scale: Fair Sitting balance - Comments: CGA close supervision for seated balance                                   ADL either performed or assessed with clinical judgement        Vision       Perception     Praxis      Cognition Arousal/Alertness: Lethargic Behavior During Therapy: Flat affect Overall Cognitive Status: Impaired/Different from baseline Area of Impairment: Orientation                 Orientation Level: Disoriented to;Place;Time;Situation             General Comments: Pt very lethargic during session and does not speak to therapist only grunts and groans with mobility        Exercises General Exercises - Lower Extremity Long Arc Quad: AROM;Strengthening;Both;10 reps           Pertinent Vitals/ Pain       Pain Assessment: Faces Faces Pain Scale: Hurts little more Pain Location: generalized with bed mobilty Pain Intervention(s): Limited activity  within patient's tolerance;Repositioned         Frequency  Min 1X/week        Progress Toward Goals  OT Goals(current goals can now be found in the care plan section)  Progress towards OT goals: Progressing toward goals  Acute Rehab OT Goals Patient Stated Goal: to go home OT Goal Formulation: With patient Time For Goal Achievement: 01/22/20 Potential to Achieve Goals: Good  Plan Discharge plan remains appropriate       AM-PAC OT "6 Clicks" Daily Activity     Outcome Measure   Help from another person eating meals?: A Little Help from  another person taking care of personal grooming?: A Lot Help from another person toileting, which includes using toliet, bedpan, or urinal?: Total Help from another person bathing (including washing, rinsing, drying)?: A Lot Help from another person to put on and taking off regular upper body clothing?: A Lot Help from another person to put on and taking off regular lower body clothing?: Total 6 Click Score: 11    End of Session Equipment Utilized During Treatment: Rolling walker  OT Visit Diagnosis: Unsteadiness on feet (R26.81);Muscle weakness (generalized) (M62.81)   Activity Tolerance Patient tolerated treatment well   Patient Left in bed;with call bell/phone within reach;with bed alarm set;with nursing/sitter in room   Nurse Communication          Time: 0034-9179 OT Time Calculation (min): 12 min  Charges: OT General Charges $OT Visit: 1 Visit OT Treatments $Self Care/Home Management : 8-22 mins  Jackquline Denmark, MS, OTR/L , CBIS ascom [phone]  01/13/20, 4:00 PM

## 2020-01-14 DIAGNOSIS — N390 Urinary tract infection, site not specified: Secondary | ICD-10-CM | POA: Diagnosis not present

## 2020-01-14 LAB — BASIC METABOLIC PANEL
Anion gap: 7 (ref 5–15)
BUN: 28 mg/dL — ABNORMAL HIGH (ref 8–23)
CO2: 29 mmol/L (ref 22–32)
Calcium: 9.2 mg/dL (ref 8.9–10.3)
Chloride: 101 mmol/L (ref 98–111)
Creatinine, Ser: 1.02 mg/dL — ABNORMAL HIGH (ref 0.44–1.00)
GFR, Estimated: 52 mL/min — ABNORMAL LOW (ref >60)
Glucose, Bld: 90 mg/dL (ref 70–99)
Potassium: 4.6 mmol/L (ref 3.5–5.1)
Sodium: 137 mmol/L (ref 135–145)

## 2020-01-14 LAB — CBC
HCT: 39.3 % (ref 36.0–46.0)
Hemoglobin: 12 g/dL (ref 12.0–15.0)
MCH: 29.6 pg (ref 26.0–34.0)
MCHC: 30.5 g/dL (ref 30.0–36.0)
MCV: 96.8 fL (ref 80.0–100.0)
Platelets: 295 10*3/uL (ref 150–400)
RBC: 4.06 MIL/uL (ref 3.87–5.11)
RDW: 13 % (ref 11.5–15.5)
WBC: 2.9 10*3/uL — ABNORMAL LOW (ref 4.0–10.5)
nRBC: 0 % (ref 0.0–0.2)

## 2020-01-14 NOTE — Progress Notes (Addendum)
Brief/Interim Summary:    PROGRESS NOTE    [name] [name]  ION:[id number] DOB: 21-May-1926 DOA: 01/07/2020 PCP: Leotis Shames, MD   Chief Complaint  Patient presents with  . Altered Mental Status  . Wellness Check  Brief Narrative: 89yof w/ no major chronic problems presented to the ED with altered mental status as per the report of daughter he appeared to be confused on the morning of admission, in the ED T 98.6 167/81 HR 62 RR 11. Exam by EDP unremarkable. Lab - CMet nl, Hg 10.8, U/A positive - hazy, moderate LE, >50 wbc/hpf. Covid NEGATIVE.Patient given rocephin IV. Patient's family felt she was too weak to return home. Patient was treated with iv antibiotics Was seen by PT/OT-skilled nursing facility has been advised and pending placement Patient otherwise medically safe for discharge.  She has completed antibiotics course 7 days.  Subjective:  No fever.  No new complaints.  No issues overnight. Alert,awake, sleeping-wakes up and interacts   Assessment & Plan: E. coli UTI, completed 7 days of antibiotics.  Afebrile and hemodynamically stable  Chronic venous stasis dermatitis of both lower extremities: Continue supportive care.  Weakness/physical deconditioning inability to take care of herself -continue to work with PT OT.  She is awaiting on placement family has chosen to place insurance authorization started today   Obesity with BMI 30.4 will need outpatient follow-up.  Nutrition: Diet Order            Diet regular Room service appropriate? Yes; Fluid consistency: Thin  Diet effective now                Body mass index is 30.48 kg/m.  DVT prophylaxis: Place and maintain sequential compression device Start: 01/09/20 1004 enoxaparin (LOVENOX) injection 40 mg Start: 01/07/20 1800 Code Status:   Code Status: Full Code  Family Communication: plan of care discussed with patient at bedside. I called and updated her grandson who is aware about pending placement  no further questions  Status is: Inpatient Remains inpatient appropriate because:Unsafe d/c plan Dispo:The patient is from: Home            Anticipated d/c is to: SNF            Anticipated d/c date is: Once bed available.Awaiting on bed at the facility, after which she will need insurance approval as per Child psychotherapist and may take several days.            Patient currently is medically stable to d/c.  Consultants:see note  Procedures:see note  Culture/Microbiology    Component Value Date/Time   SDES  01/07/2020 1457    URINE, RANDOM Performed at [facility], [address] [place] [place], [place] [address]    [facility]  01/07/2020 1457    NONE Performed at [facility], [address] [place], [place] [address]    CULT >=100,000 COLONIES/mL ESCHERICHIA COLI (A) 01/07/2020 1457   REPTSTATUS 01/10/2020 FINAL 01/07/2020 1457    Other culture-see note  Medications: Scheduled Meds: . enoxaparin (LOVENOX) injection  40 mg Subcutaneous Q24H  . sodium chloride flush  3 mL Intravenous Q12H   Continuous Infusions: . sodium chloride      Antimicrobials: Anti-infectives (From admission, onward)   Start     Dose/Rate Route Frequency Ordered Stop   01/11/20 1800  cephALEXin (KEFLEX) capsule 500 mg        500 mg Oral 4 times daily 01/11/20 1436 01/13/20 2359   01/08/20 1000  cefTRIAXone (ROCEPHIN) 1 g in  sodium chloride 0.9 % 100 mL IVPB  Status:  Discontinued        1 g 200 mL/hr over 30 Minutes Intravenous Every 24 hours 01/08/20 0801 01/11/20 1436   01/07/20 2200  cefdinir (OMNICEF) capsule 300 mg  Status:  Discontinued        300 mg Oral Every 12 hours 01/07/20 1722 01/08/20 0801   01/07/20 1000  cefTRIAXone (ROCEPHIN) 1 g in sodium chloride 0.9 % 100 mL IVPB        1 g 200 mL/hr over 30 Minutes Intravenous  Once 01/07/20 0953 01/07/20 1341     Objective: Vitals: Today's Vitals   01/13/20 2110 01/13/20 2344 01/14/20 0458 01/14/20 0749  BP: (!) 124/48  (!) 105/52 (!) 101/47 118/61  Pulse: (!) 54 (!) 55 (!) 53 (!) 53  Resp: 15 15 15 15   Temp: 97.6 F (36.4 C) 97.7 F (36.5 C) 99 F (37.2 C) 97.8 F (36.6 C)  TempSrc:      SpO2: 100% 97% 97% 98%  Weight:      Height:      PainSc:  Asleep      Intake/Output Summary (Last 24 hours) at 01/14/2020 1023 Last data filed at 01/14/2020 0400 Gross per 24 hour  Intake --  Output 300 ml  Net -300 ml   Filed Weights   01/07/20 0824  Weight: 88.3 kg   Weight change:   Intake/Output from previous day: 01/03 0701 - 01/04 0700 In: -  Out: 950 [Urine:950] Intake/Output this shift: No intake/output data recorded.  Examination: General exam: AAO ,NAD, weak appearing. HEENT:Oral mucosa moist, Ear/Nose WNL grossly,dentition normal. Respiratory system: bilaterally clear,no wheezing or crackles,no use of accessory muscle, non tender. Cardiovascular system: S1 & S2 +, regular, No JVD. Gastrointestinal system: Abdomen soft, NT,ND, BS+. Nervous System:Alert, awake, moving extremities and grossly nonfocal Extremities: No edema, distal peripheral pulses palpable.  Skin: No rashes,no icterus. MSK: Normal muscle bulk,tone, power  Data Reviewed: I have personally reviewed following labs and imaging studies CBC: Recent Labs  Lab 01/10/20 0551 01/11/20 0500 01/12/20 0850 01/13/20 0904 01/14/20 0740  WBC 4.0 3.6* 2.9* 2.9* 2.9*  HGB 10.5* 9.8* 11.8* 11.6* 12.0  HCT 33.8* 31.9* 36.7 37.2 39.3  MCV 95.5 95.8 93.6 94.9 96.8  PLT 227 240 270 273 751   Basic Metabolic Panel: Recent Labs  Lab 01/10/20 0551 01/11/20 0500 01/12/20 0850 01/13/20 0904 01/14/20 0740  NA 140 137 136 136 137  K 4.5 4.6 4.8 4.5 4.6  CL 108 105 101 101 101  CO2 27 27 29 28 29   GLUCOSE 99 93 113* 90 90  BUN 17 18 20  27* 28*  CREATININE 0.97 0.84 0.88 0.88 1.02*  CALCIUM 8.3* 8.9 9.3 9.1 9.2   GFR: Estimated Creatinine Clearance: 41 mL/min (A) (by C-G formula based on SCr of 1.02 mg/dL (H)). Liver Function  Tests: No results for input(s): AST, ALT, ALKPHOS, BILITOT, PROT, ALBUMIN in the last 168 hours. No results for input(s): LIPASE, AMYLASE in the last 168 hours. No results for input(s): AMMONIA in the last 168 hours. Coagulation Profile: No results for input(s): INR, PROTIME in the last 168 hours. Cardiac Enzymes: No results for input(s): CKTOTAL, CKMB, CKMBINDEX, TROPONINI in the last 168 hours. BNP (last 3 results) No results for input(s): PROBNP in the last 8760 hours. HbA1C: No results for input(s): HGBA1C in the last 72 hours. CBG: No results for input(s): GLUCAP in the last 168 hours. Lipid Profile: No results for input(s):  CHOL, HDL, LDLCALC, TRIG, CHOLHDL, LDLDIRECT in the last 72 hours. Thyroid Function Tests: No results for input(s): TSH, T4TOTAL, FREET4, T3FREE, THYROIDAB in the last 72 hours. Anemia Panel: No results for input(s): VITAMINB12, FOLATE, FERRITIN, TIBC, IRON, RETICCTPCT in the last 72 hours. Sepsis Labs: No results for input(s): PROCALCITON, LATICACIDVEN in the last 168 hours.  Recent Results (from the past 240 hour(s))  Resp Panel by RT-PCR (Flu A&B, Covid) Nasopharyngeal Swab     Status: None   Collection Time: 01/07/20  8:32 AM   Specimen: Nasopharyngeal Swab; Nasopharyngeal(NP) swabs in vial transport medium  Result Value Ref Range Status   SARS Coronavirus 2 by RT PCR NEGATIVE NEGATIVE Final    Comment: (NOTE) SARS-CoV-2 target nucleic acids are NOT DETECTED.  The SARS-CoV-2 RNA is generally detectable in upper respiratory specimens during the acute phase of infection. The lowest concentration of SARS-CoV-2 viral copies this assay can detect is 138 copies/mL. A negative result does not preclude SARS-Cov-2 infection and should not be used as the sole basis for treatment or other patient management decisions. A negative result may occur with  improper specimen collection/handling, submission of specimen other than nasopharyngeal swab, presence of  viral mutation(s) within the areas targeted by this assay, and inadequate number of viral copies(<138 copies/mL). A negative result must be combined with clinical observations, patient history, and epidemiological information. The expected result is Negative.  Fact Sheet for Patients:  BloggerCourse.com  Fact Sheet for Healthcare Providers:  SeriousBroker.it  This test is no t yet approved or cleared by the [place] FDA and  has been authorized for detection and/or diagnosis of SARS-CoV-2 by FDA under an Emergency Use Authorization (EUA). This EUA will remain  in effect (meaning this test can be used) for the duration of the COVID-19 declaration under Section 564(b)(1) of the Act, 21 U.S.C.section 360bbb-3(b)(1), unless the authorization is terminated  or revoked sooner.       Influenza A by PCR NEGATIVE NEGATIVE Final   Influenza B by PCR NEGATIVE NEGATIVE Final    Comment: (NOTE) The Xpert Xpress SARS-CoV-2/FLU/RSV plus assay is intended as an aid in the diagnosis of influenza from Nasopharyngeal swab specimens and should not be used as a sole basis for treatment. Nasal washings and aspirates are unacceptable for Xpert Xpress SARS-CoV-2/FLU/RSV testing.  Fact Sheet for Patients: BloggerCourse.com  Fact Sheet for Healthcare Providers: SeriousBroker.it  This test is not yet approved or cleared by the [place] FDA and has been authorized for detection and/or diagnosis of SARS-CoV-2 by FDA under an Emergency Use Authorization (EUA). This EUA will remain in effect (meaning this test can be used) for the duration of the COVID-19 declaration under Section 564(b)(1) of the Act, 21 U.S.C. section 360bbb-3(b)(1), unless the authorization is terminated or revoked.  Performed at [facility], [address], [place], [place] [address]   Urine Culture     Status:  Abnormal   Collection Time: 01/07/20  2:57 PM   Specimen: Urine, Random  Result Value Ref Range Status   Specimen Description   Final    URINE, RANDOM Performed at [facility], [address] [place], [place] [address]    Special Requests   Final    NONE Performed at [facility], [address] [place], [place] [address]    Culture >=100,000 COLONIES/mL ESCHERICHIA COLI (A)  Final   Report Status 01/10/2020 FINAL  Final   Organism ID, Bacteria ESCHERICHIA COLI (A)  Final  Susceptibility   Escherichia coli - MIC*    AMPICILLIN >=32 RESISTANT Resistant     CEFAZOLIN 16 SENSITIVE Sensitive     CEFEPIME <=0.12 SENSITIVE Sensitive     CEFTRIAXONE <=0.25 SENSITIVE Sensitive     CIPROFLOXACIN <=0.25 SENSITIVE Sensitive     GENTAMICIN <=1 SENSITIVE Sensitive     IMIPENEM <=0.25 SENSITIVE Sensitive     NITROFURANTOIN <=16 SENSITIVE Sensitive     TRIMETH/SULFA <=20 SENSITIVE Sensitive     AMPICILLIN/SULBACTAM >=32 RESISTANT Resistant     PIP/TAZO <=4 SENSITIVE Sensitive     * >=100,000 COLONIES/mL ESCHERICHIA COLI  MRSA PCR Screening     Status: None   Collection Time: 01/09/20  9:01 PM   Specimen: Nasopharyngeal  Result Value Ref Range Status   MRSA by PCR NEGATIVE NEGATIVE Final    Comment:        The GeneXpert MRSA Assay (FDA approved for NASAL specimens only), is one component of a comprehensive MRSA colonization surveillance program. It is not intended to diagnose MRSA infection nor to guide or monitor treatment for MRSA infections. Performed at [facility], [address], [place], [place] [address]      Radiology Studies: No results found.   LOS: 4 days   Lanae Boast, MD [facility]  01/14/2020, 10:23 AM

## 2020-01-14 NOTE — Care Management Important Message (Signed)
Important Message  Patient Details  Name: [name] [name] MRN: [id number] Date of Birth: 02-Sep-1926   Medicare Important Message Given:  Yes     [name] A Torina Ey 01/14/2020, 11:25 AM

## 2020-01-15 DIAGNOSIS — R531 Weakness: Secondary | ICD-10-CM | POA: Diagnosis not present

## 2020-01-15 DIAGNOSIS — B9620 Unspecified Escherichia coli [E. coli] as the cause of diseases classified elsewhere: Secondary | ICD-10-CM

## 2020-01-15 DIAGNOSIS — I872 Venous insufficiency (chronic) (peripheral): Secondary | ICD-10-CM | POA: Diagnosis not present

## 2020-01-15 DIAGNOSIS — N390 Urinary tract infection, site not specified: Secondary | ICD-10-CM | POA: Diagnosis not present

## 2020-01-15 DIAGNOSIS — L89322 Pressure ulcer of left buttock, stage 2: Secondary | ICD-10-CM

## 2020-01-15 DIAGNOSIS — R410 Disorientation, unspecified: Secondary | ICD-10-CM

## 2020-01-15 LAB — TSH: TSH: 3.806 u[IU]/mL (ref 0.350–4.500)

## 2020-01-15 LAB — VITAMIN B12: Vitamin B-12: 268 pg/mL (ref 180–914)

## 2020-01-15 NOTE — Progress Notes (Signed)
Physical Therapy Treatment Patient Details Name: [name] [name] MRN: [id number] DOB: 01-07-1927 Today's Date: 01/15/2020    History of Present Illness 87 y.o. female with medical history significant of no major chronic medical problems.  She presents to the emergency department for altered mental status.  According to EMS report the daughter spoke to the patient this morning and she appeared to be confused.  Here the patient is awake and alert.  Oriented to person and place but not time.  Patient denies any symptoms.  Per report however the home health aide recently tested positive for Covid.  Patient denies any shortness of breath or cough.  States she feels "lousy" states she feels extremely weak    PT Comments    Pt initially very attentive to therapist. She is A&O to self only. The pt states that she wants to return home, however states that she does not have to move once she returns home. She also deflects when asked what level of assistance she would have at home. The pt is self-limiting with mobility this session. She refuses any level of mobility, bed level or OOB, although initially agreeing to participate with PT. Recommend continuation of d/c plan at this time.     Follow Up Recommendations  SNF     Equipment Recommendations  None recommended by PT    Recommendations for Other Services       Precautions / Restrictions Precautions Precautions: Fall Restrictions Weight Bearing Restrictions: No    Mobility  Bed Mobility Overal bed mobility: Needs Assistance             General bed mobility comments: Pt refusing all functional mobility with PT; Expected to require Max A  Transfers Overall transfer level: Needs assistance                  Ambulation/Gait             General Gait Details: Unable to attempt   Stairs             Wheelchair Mobility    Modified Rankin (Stroke Patients Only)       Balance                                             Cognition Arousal/Alertness: Awake/alert Behavior During Therapy: WFL for tasks assessed/performed Overall Cognitive Status: No family/caregiver present to determine baseline cognitive functioning Area of Impairment: Awareness                 Orientation Level: Disoriented to;Place;Time;Situation             General Comments: Pt stating that she is at a friend's home when asked location. She also states that she needs to get home prior to the rain because she doesn't drive well in the rain.      Exercises      General Comments        Pertinent Vitals/Pain Pain Assessment: No/denies pain    Home Living                      Prior Function            PT Goals (current goals can now be found in the care plan section) Acute Rehab PT Goals Patient Stated Goal: to go home PT Goal Formulation: With patient Time For Goal Achievement:  01/16/20 Potential to Achieve Goals: Fair    Frequency    Min 2X/week      PT Plan Current plan remains appropriate    Co-evaluation              AM-PAC PT "6 Clicks" Mobility   Outcome Measure  Help needed turning from your back to your side while in a flat bed without using bedrails?: A Lot Help needed moving from lying on your back to sitting on the side of a flat bed without using bedrails?: A Lot   Help needed standing up from a chair using your arms (e.g., wheelchair or bedside chair)?: Total Help needed to walk in hospital room?: Total Help needed climbing 3-5 steps with a railing? : Total 6 Click Score: 7    End of Session   Activity Tolerance: Other (comment) (Patient self limiting this session.) Patient left: in bed;with call bell/phone within reach;with bed alarm set   PT Visit Diagnosis: Other abnormalities of gait and mobility (R26.89);Muscle weakness (generalized) (M62.81)     Time: 7591-6384 PT Time Calculation (min) (ACUTE ONLY): 8 min  Charges:   $Therapeutic Activity: 8-22 mins                     2:37 PM, 01/15/20 Jaleil Renwick A. Mordecai Maes PT, DPT Physical Therapist - [facility]   Tyliah Schlereth A Vaughan Garfinkle 01/15/2020, 2:33 PM

## 2020-01-15 NOTE — Progress Notes (Signed)
Patient ID: [name] [name], female   DOB: [birth year], 87 y.o.   MRN: [id number] [facility] PROGRESS NOTE  [name] [name] HWE:[id number] DOB: 25-Nov-1926 DOA: 01/07/2020 PCP: Leotis Shames, MD  HPI/Subjective: Patient is pleasantly confused.  I told her I will see her tomorrow and she was wondering if she had to come see me.  I told her she was in the hospital on that and she said no ER.  Patient offers no physical complaints.  Treated for UTI.  Objective: Vitals:   01/15/20 1158 01/15/20 1642  BP: (!) 134/51 (!) 101/56  Pulse: (!) 57 (!) 55  Resp: 16 16  Temp: 98 F (36.7 C) 97.9 F (36.6 C)  SpO2: 97% 100%    Intake/Output Summary (Last 24 hours) at 01/15/2020 1653 Last data filed at 01/15/2020 1420 Gross per 24 hour  Intake 720 ml  Output --  Net 720 ml   Filed Weights   01/07/20 0824  Weight: 88.3 kg    ROS: Review of Systems  Respiratory: Negative for shortness of breath.   Cardiovascular: Negative for chest pain.  Gastrointestinal: Negative for abdominal pain, nausea and vomiting.   Exam: Physical Exam HENT:     Head: Normocephalic.     Mouth/Throat:     Pharynx: No oropharyngeal exudate.  Eyes:     General: Lids are normal.     Conjunctiva/sclera: Conjunctivae normal.  Cardiovascular:     Rate and Rhythm: Normal rate and regular rhythm.     Heart sounds: Normal heart sounds, S1 normal and S2 normal.  Pulmonary:     Breath sounds: No decreased breath sounds, wheezing, rhonchi or rales.  Abdominal:     Palpations: Abdomen is soft.     Tenderness: There is no abdominal tenderness.  Musculoskeletal:     Right ankle: No swelling.     Left ankle: No swelling.  Skin:    General: Skin is warm.     Findings: No rash.  Neurological:     Mental Status: She is alert.       Data Reviewed: Basic Metabolic Panel: Recent Labs  Lab 01/10/20 0551 01/11/20 0500 01/12/20 0850 01/13/20 0904 01/14/20 0740  NA 140 137 136 136 137  K 4.5 4.6 4.8 4.5 4.6   CL 108 105 101 101 101  CO2 27 27 29 28 29   GLUCOSE 99 93 113* 90 90  BUN 17 18 20  27* 28*  CREATININE 0.97 0.84 0.88 0.88 1.02*  CALCIUM 8.3* 8.9 9.3 9.1 9.2   CBC: Recent Labs  Lab 01/10/20 0551 01/11/20 0500 01/12/20 0850 01/13/20 0904 01/14/20 0740  WBC 4.0 3.6* 2.9* 2.9* 2.9*  HGB 10.5* 9.8* 11.8* 11.6* 12.0  HCT 33.8* 31.9* 36.7 37.2 39.3  MCV 95.5 95.8 93.6 94.9 96.8  PLT 227 240 270 273 295     Recent Results (from the past 240 hour(s))  Resp Panel by RT-PCR (Flu A&B, Covid) Nasopharyngeal Swab     Status: None   Collection Time: 01/07/20  8:32 AM   Specimen: Nasopharyngeal Swab; Nasopharyngeal(NP) swabs in vial transport medium  Result Value Ref Range Status   SARS Coronavirus 2 by RT PCR NEGATIVE NEGATIVE Final    Comment: (NOTE) SARS-CoV-2 target nucleic acids are NOT DETECTED.  The SARS-CoV-2 RNA is generally detectable in upper respiratory specimens during the acute phase of infection. The lowest concentration of SARS-CoV-2 viral copies this assay can detect is 138 copies/mL. A negative result does not preclude SARS-Cov-2 infection and should not be used  as the sole basis for treatment or other patient management decisions. A negative result may occur with  improper specimen collection/handling, submission of specimen other than nasopharyngeal swab, presence of viral mutation(s) within the areas targeted by this assay, and inadequate number of viral copies(<138 copies/mL). A negative result must be combined with clinical observations, patient history, and epidemiological information. The expected result is Negative.  Fact Sheet for Patients:  EntrepreneurPulse.com.au  Fact Sheet for Healthcare Providers:  IncredibleEmployment.be  This test is no t yet approved or cleared by the [place] FDA and  has been authorized for detection and/or diagnosis of SARS-CoV-2 by FDA under an Emergency Use Authorization (EUA).  This EUA will remain  in effect (meaning this test can be used) for the duration of the COVID-19 declaration under Section 564(b)(1) of the Act, 21 U.S.C.section 360bbb-3(b)(1), unless the authorization is terminated  or revoked sooner.       Influenza A by PCR NEGATIVE NEGATIVE Final   Influenza B by PCR NEGATIVE NEGATIVE Final    Comment: (NOTE) The Xpert Xpress SARS-CoV-2/FLU/RSV plus assay is intended as an aid in the diagnosis of influenza from Nasopharyngeal swab specimens and should not be used as a sole basis for treatment. Nasal washings and aspirates are unacceptable for Xpert Xpress SARS-CoV-2/FLU/RSV testing.  Fact Sheet for Patients: EntrepreneurPulse.com.au  Fact Sheet for Healthcare Providers: IncredibleEmployment.be  This test is not yet approved or cleared by the [place] FDA and has been authorized for detection and/or diagnosis of SARS-CoV-2 by FDA under an Emergency Use Authorization (EUA). This EUA will remain in effect (meaning this test can be used) for the duration of the COVID-19 declaration under Section 564(b)(1) of the Act, 21 U.S.C. section 360bbb-3(b)(1), unless the authorization is terminated or revoked.  Performed at [facility], [address] [place], [place] [address]   Urine Culture     Status: Abnormal   Collection Time: 01/07/20  2:57 PM   Specimen: Urine, Random  Result Value Ref Range Status   Specimen Description   Final    URINE, RANDOM Performed at [facility], [address] [place], [place] [address]    Special Requests   Final    NONE Performed at [facility], [place], [place] [address]    Culture >=100,000 COLONIES/mL ESCHERICHIA COLI (A)  Final   Report Status 01/10/2020 FINAL  Final   Organism ID, Bacteria ESCHERICHIA COLI (A)  Final      Susceptibility   Escherichia coli - MIC*    AMPICILLIN >=32 RESISTANT Resistant      CEFAZOLIN 16 SENSITIVE Sensitive     CEFEPIME <=0.12 SENSITIVE Sensitive     CEFTRIAXONE <=0.25 SENSITIVE Sensitive     CIPROFLOXACIN <=0.25 SENSITIVE Sensitive     GENTAMICIN <=1 SENSITIVE Sensitive     IMIPENEM <=0.25 SENSITIVE Sensitive     NITROFURANTOIN <=16 SENSITIVE Sensitive     TRIMETH/SULFA <=20 SENSITIVE Sensitive     AMPICILLIN/SULBACTAM >=32 RESISTANT Resistant     PIP/TAZO <=4 SENSITIVE Sensitive     * >=100,000 COLONIES/mL ESCHERICHIA COLI  MRSA PCR Screening     Status: None   Collection Time: 01/09/20  9:01 PM   Specimen: Nasopharyngeal  Result Value Ref Range Status   MRSA by PCR NEGATIVE NEGATIVE Final    Comment:        The GeneXpert MRSA Assay (FDA approved for NASAL specimens only), is one component of a comprehensive MRSA colonization surveillance program. It is  not intended to diagnose MRSA infection nor to guide or monitor treatment for MRSA infections. Performed at [facility], [address] [place], [place] [address]      Studies: No results found.  Scheduled Meds: . enoxaparin (LOVENOX) injection  40 mg Subcutaneous Q24H  . sodium chloride flush  3 mL Intravenous Q12H   Continuous Infusions: . sodium chloride      Assessment/Plan:  1. Weakness.  Awaiting insurance authorization for rehab. 2. E. coli UTI.  Completed 7 days of antibiotics. 3. Stage II decubitus ulcer left ischial tuberosity present on admission see description below 4. Chronic venous stasis dermatitis both lower extremities. 5. Confusion with acute metabolic encephalopathy.  Suspect underlying dementia check B12, RPR and TSH.  Pressure Injury 11/05/16 Stage II -  Partial thickness loss of dermis presenting as a shallow open ulcer with a red, pink wound bed without slough. granulated tissue (Active)  11/05/16 0153  Location: Ischial tuberosity  Location Orientation: Left  Staging: Stage II -  Partial thickness loss of dermis presenting as a shallow open ulcer  with a red, pink wound bed without slough.  Wound Description (Comments): granulated tissue  Present on Admission: Yes       Code Status:     Code Status Orders  (From admission, onward)         Start     Ordered   01/07/20 1721  Full code  Continuous        01/07/20 1722        Code Status History    Date Active Date Inactive Code Status Order ID Comments User Context   03/06/2017 1958 03/09/2017 1852 Full Code [id number]  Bertrum Sol, MD Inpatient   11/25/2016 0238 11/28/2016 2229 Full Code [id number]  Oralia Manis, MD Inpatient   11/05/2016 0104 11/07/2016 2237 Full Code [id number]  Arnaldo Natal, MD ED   Advance Care Planning Activity    Advance Directive Documentation   Flowsheet Row Most Recent Value  Type of Advance Directive Living will  Pre-existing out of facility DNR order (yellow form or pink MOST form) --  "MOST" Form in Place? --     Family Communication: Updated grandson on the phone Disposition Plan: Status is: Inpatient  Dispo: The patient is from: Home              Anticipated d/c is to: Rehab              Anticipated d/c date is: Awaiting insurance authorization for rehab.  Once this is obtained we will send the 6 to 24-hour Covid test.  Anticipate discharge 01/17/2020.              Patient currently medically stable for discharge to rehab.  Time spent: 28 minutes  Latesha Chesney [employer]

## 2020-01-16 DIAGNOSIS — I872 Venous insufficiency (chronic) (peripheral): Secondary | ICD-10-CM | POA: Diagnosis not present

## 2020-01-16 DIAGNOSIS — L89322 Pressure ulcer of left buttock, stage 2: Secondary | ICD-10-CM | POA: Diagnosis not present

## 2020-01-16 DIAGNOSIS — N390 Urinary tract infection, site not specified: Secondary | ICD-10-CM | POA: Diagnosis not present

## 2020-01-16 DIAGNOSIS — R531 Weakness: Secondary | ICD-10-CM | POA: Diagnosis not present

## 2020-01-16 DIAGNOSIS — G9341 Metabolic encephalopathy: Secondary | ICD-10-CM

## 2020-01-16 LAB — RPR: RPR Ser Ql: NONREACTIVE

## 2020-01-16 NOTE — Progress Notes (Signed)
Patient ID: [name] [name], female   DOB: 08-Aug-1926, 87 y.o.   MRN: [id number] [facility] PROGRESS NOTE  [name] [name] UYQ:[id number] DOB: 03-Nov-1926 DOA: 01/07/2020 PCP: Leotis Shames, MD  HPI/Subjective: Patient feels okay and offers no complaints.  Was able to mobilize in the bed and get her legs off the bed but unable to stand with physical therapy today.  Physical therapy states that I had to hold her up.  No complaints of chest pain or shortness of breath admitted with weakness and UTI.  Objective: Vitals:   01/16/20 0743 01/16/20 1204  BP: (!) 157/60 (!) 122/58  Pulse: (!) 54 73  Resp: 16 16  Temp: 98 F (36.7 C) 97.9 F (36.6 C)  SpO2: 99% 98%    Intake/Output Summary (Last 24 hours) at 01/16/2020 1242 Last data filed at 01/15/2020 2243 Gross per 24 hour  Intake 240 ml  Output 750 ml  Net -510 ml   Filed Weights   01/07/20 0824  Weight: 88.3 kg    ROS: Review of Systems  Respiratory: Negative for shortness of breath.   Cardiovascular: Negative for chest pain.  Gastrointestinal: Negative for abdominal pain, nausea and vomiting.   Exam: Physical Exam HENT:     Head: Normocephalic.     Mouth/Throat:     Pharynx: No oropharyngeal exudate.  Eyes:     General: Lids are normal.     Conjunctiva/sclera: Conjunctivae normal.     Pupils: Pupils are equal, round, and reactive to light.  Cardiovascular:     Rate and Rhythm: Normal rate and regular rhythm.     Heart sounds: Normal heart sounds, S1 normal and S2 normal.  Pulmonary:     Breath sounds: No decreased breath sounds, wheezing, rhonchi or rales.  Abdominal:     Palpations: Abdomen is soft.     Tenderness: There is no abdominal tenderness.  Musculoskeletal:     Right lower leg: Swelling present.     Left lower leg: Swelling present.  Skin:    General: Skin is warm.     Comments: Chronic lower extremity skin discoloration  Neurological:     Mental Status: She is alert.     Comments: Answers some  yes/no questions.       Data Reviewed: Basic Metabolic Panel: Recent Labs  Lab 01/10/20 0551 01/11/20 0500 01/12/20 0850 01/13/20 0904 01/14/20 0740  NA 140 137 136 136 137  K 4.5 4.6 4.8 4.5 4.6  CL 108 105 101 101 101  CO2 27 27 29 28 29   GLUCOSE 99 93 113* 90 90  BUN 17 18 20  27* 28*  CREATININE 0.97 0.84 0.88 0.88 1.02*  CALCIUM 8.3* 8.9 9.3 9.1 9.2   CBC: Recent Labs  Lab 01/10/20 0551 01/11/20 0500 01/12/20 0850 01/13/20 0904 01/14/20 0740  WBC 4.0 3.6* 2.9* 2.9* 2.9*  HGB 10.5* 9.8* 11.8* 11.6* 12.0  HCT 33.8* 31.9* 36.7 37.2 39.3  MCV 95.5 95.8 93.6 94.9 96.8  PLT 227 240 270 273 295     Recent Results (from the past 240 hour(s))  Resp Panel by RT-PCR (Flu A&B, Covid) Nasopharyngeal Swab     Status: None   Collection Time: 01/07/20  8:32 AM   Specimen: Nasopharyngeal Swab; Nasopharyngeal(NP) swabs in vial transport medium  Result Value Ref Range Status   SARS Coronavirus 2 by RT PCR NEGATIVE NEGATIVE Final    Comment: (NOTE) SARS-CoV-2 target nucleic acids are NOT DETECTED.  The SARS-CoV-2 RNA is generally detectable in upper respiratory specimens  during the acute phase of infection. The lowest concentration of SARS-CoV-2 viral copies this assay can detect is 138 copies/mL. A negative result does not preclude SARS-Cov-2 infection and should not be used as the sole basis for treatment or other patient management decisions. A negative result may occur with  improper specimen collection/handling, submission of specimen other than nasopharyngeal swab, presence of viral mutation(s) within the areas targeted by this assay, and inadequate number of viral copies(<138 copies/mL). A negative result must be combined with clinical observations, patient history, and epidemiological information. The expected result is Negative.  Fact Sheet for Patients:  BloggerCourse.com  Fact Sheet for Healthcare Providers:   SeriousBroker.it  This test is no t yet approved or cleared by the [place] FDA and  has been authorized for detection and/or diagnosis of SARS-CoV-2 by FDA under an Emergency Use Authorization (EUA). This EUA will remain  in effect (meaning this test can be used) for the duration of the COVID-19 declaration under Section 564(b)(1) of the Act, 21 U.S.C.section 360bbb-3(b)(1), unless the authorization is terminated  or revoked sooner.       Influenza A by PCR NEGATIVE NEGATIVE Final   Influenza B by PCR NEGATIVE NEGATIVE Final    Comment: (NOTE) The Xpert Xpress SARS-CoV-2/FLU/RSV plus assay is intended as an aid in the diagnosis of influenza from Nasopharyngeal swab specimens and should not be used as a sole basis for treatment. Nasal washings and aspirates are unacceptable for Xpert Xpress SARS-CoV-2/FLU/RSV testing.  Fact Sheet for Patients: BloggerCourse.com  Fact Sheet for Healthcare Providers: SeriousBroker.it  This test is not yet approved or cleared by the [place] FDA and has been authorized for detection and/or diagnosis of SARS-CoV-2 by FDA under an Emergency Use Authorization (EUA). This EUA will remain in effect (meaning this test can be used) for the duration of the COVID-19 declaration under Section 564(b)(1) of the Act, 21 U.S.C. section 360bbb-3(b)(1), unless the authorization is terminated or revoked.  Performed at [facility], [address] [place], [place] [address]   Urine Culture     Status: Abnormal   Collection Time: 01/07/20  2:57 PM   Specimen: Urine, Random  Result Value Ref Range Status   Specimen Description   Final    URINE, RANDOM Performed at [facility], [address] [place], [place] [address]    Special Requests   Final    NONE Performed at [facility], [address] [place], [place] [address]    Culture  >=100,000 COLONIES/mL ESCHERICHIA COLI (A)  Final   Report Status 01/10/2020 FINAL  Final   Organism ID, Bacteria ESCHERICHIA COLI (A)  Final      Susceptibility   Escherichia coli - MIC*    AMPICILLIN >=32 RESISTANT Resistant     CEFAZOLIN 16 SENSITIVE Sensitive     CEFEPIME <=0.12 SENSITIVE Sensitive     CEFTRIAXONE <=0.25 SENSITIVE Sensitive     CIPROFLOXACIN <=0.25 SENSITIVE Sensitive     GENTAMICIN <=1 SENSITIVE Sensitive     IMIPENEM <=0.25 SENSITIVE Sensitive     NITROFURANTOIN <=16 SENSITIVE Sensitive     TRIMETH/SULFA <=20 SENSITIVE Sensitive     AMPICILLIN/SULBACTAM >=32 RESISTANT Resistant     PIP/TAZO <=4 SENSITIVE Sensitive     * >=100,000 COLONIES/mL ESCHERICHIA COLI  MRSA PCR Screening     Status: None   Collection Time: 01/09/20  9:01 PM   Specimen: Nasopharyngeal  Result Value Ref Range Status   MRSA by PCR NEGATIVE NEGATIVE Final  Comment:        The GeneXpert MRSA Assay (FDA approved for NASAL specimens only), is one component of a comprehensive MRSA colonization surveillance program. It is not intended to diagnose MRSA infection nor to guide or monitor treatment for MRSA infections. Performed at [facility], [address] [place], [place] [address]       Scheduled Meds: . enoxaparin (LOVENOX) injection  40 mg Subcutaneous Q24H  . sodium chloride flush  3 mL Intravenous Q12H   Continuous Infusions: . sodium chloride      Assessment/Plan:  1. Generalized weakness.  Patient able to mobilize in bed but unable to stand up without being held up.  Insurance denied authorization for rehab.  Grandson to appeal. 2. E. coli UTI.  Completed 7 days of antibiotics. 3. Stage II decubitus ulcer left ischial tuberosity, present on admission.  See description below 4. Chronic venous stasis dermatitis both lower extremities 5. Acute metabolic encephalopathy.  Patient answers more questions today and is less confused today than yesterday.  Suspect  underlying dementia.  B12, RPR and TSH are all in the normal range.  Pressure Injury 11/05/16 Stage II -  Partial thickness loss of dermis presenting as a shallow open ulcer with a red, pink wound bed without slough. granulated tissue (Active)  11/05/16 0153  Location: Ischial tuberosity  Location Orientation: Left  Staging: Stage II -  Partial thickness loss of dermis presenting as a shallow open ulcer with a red, pink wound bed without slough.  Wound Description (Comments): granulated tissue  Present on Admission: Yes       Code Status:     Code Status Orders  (From admission, onward)         Start     Ordered   01/07/20 1721  Full code  Continuous        01/07/20 1722        Code Status History    Date Active Date Inactive Code Status Order ID Comments User Context   03/06/2017 1958 03/09/2017 1852 Full Code [id number]  Gorden Harms, MD Inpatient   11/25/2016 0238 11/28/2016 2229 Full Code [id number]  Lance Coon, MD Inpatient   11/05/2016 0104 11/07/2016 2237 Full Code [id number]  Harrie Foreman, MD ED   Advance Care Planning Activity    Advance Directive Documentation   Flowsheet Row Most Recent Value  Type of Advance Directive Living will  Pre-existing out of facility DNR order (yellow form or pink MOST form) --  "MOST" Form in Place? --     Family Communication: Spoke with grandson on the phone Disposition Plan: Status is: Inpatient  Dispo: The patient is from: Home              Anticipated d/c is to: Yolanda Bonine would like rehab at this point.  Undergoing appeal process with insurance company              Anticipated d/c date is: Once insurance company looks at the appeal process.              Patient currently can go out to rehab at any time.  Time spent: 26 minutes  [place]

## 2020-01-16 NOTE — TOC Progression Note (Signed)
Transition of Care [facility]) - Progression Note    Patient Details  Name: [name] [name] MRN: [id number] Date of Birth: 05-22-1926  [facility] CM/SW Contact  Trenton Founds, RN Phone Number: 01/16/2020, 10:37 AM  Clinical Narrative:  RNCM contacted by Revonda Standard with [facility] who reports Monia Pouch has denied. She reports that she has reached out to patient's grandson to discuss possibility of appeal verses using her long term Medicaid. [name] is going to reach out to [employer] and appeal decision.      Expected Discharge Plan: Skilled Nursing Facility Barriers to Discharge: No Barriers Identified  Expected Discharge Plan and Services Expected Discharge Plan: Skilled Nursing Facility     Post Acute Care Choice: Skilled Nursing Facility Living arrangements for the past 2 months: Single Family Home                                       Social Determinants of Health (SDOH) Interventions    Readmission Risk Interventions No flowsheet data found.

## 2020-01-16 NOTE — Progress Notes (Signed)
Physical Therapy Treatment Patient Details Name: [name] [name] MRN: [id number] DOB: January 18, 1926 Today's Date: 01/16/2020    History of Present Illness 87 y.o. female with medical history significant of no major chronic medical problems.  She presents to the emergency department for altered mental status.  According to EMS report the daughter spoke to the patient this morning and she appeared to be confused.  Here the patient is awake and alert.  Oriented to person and place but not time.  Patient denies any symptoms.  Per report however the home health aide recently tested positive for Covid.  Patient denies any shortness of breath or cough.  States she feels "lousy" states she feels extremely weak    PT Comments    Patient received in bed, pleasantly confused. Agrees to PT session. Patient is mod independent with supine to sit. Requires +2 max assist for attempting sit to stand. With +2 max assist she is unable to get fully standing and unable to achieve balance. Patient will continue to benefit from skilled PT while here to improve functional mobility and safety.      Follow Up Recommendations  SNF;Supervision for mobility/OOB;Supervision/Assistance - 24 hour     Equipment Recommendations  Other (comment) (TBD)    Recommendations for Other Services       Precautions / Restrictions Precautions Precautions: Fall Restrictions Weight Bearing Restrictions: No    Mobility  Bed Mobility Overal bed mobility: Modified Independent;Needs Assistance Bed Mobility: Supine to Sit;Sit to Supine     Supine to sit: Modified independent (Device/Increase time) Sit to supine: Mod assist;+2 for physical assistance;Max assist   General bed mobility comments: patient unable to scoot back onto bed after standing attempts, therefore required mod/max +2 to return to bed.  Transfers Overall transfer level: Needs assistance Equipment used: Rolling walker (2 wheeled) Transfers: Sit to/from Stand Sit  to Stand: Max assist;+2 physical assistance         General transfer comment: Patient requires +2 max assist to stand with RW. Unable to achieve full standing or balance when on feet. She has knees flexed and leaning posteriorly.  Ambulation/Gait             General Gait Details: Unable   Social research officer, government Rankin (Stroke Patients Only)       Balance Overall balance assessment: Needs assistance Sitting-balance support: Feet supported Sitting balance-Leahy Scale: Good Sitting balance - Comments: CGA close supervision for seated balance   Standing balance support: Bilateral upper extremity supported;During functional activity Standing balance-Leahy Scale: Zero Standing balance comment: unable to achieve standing balance even with max A of 2                            Cognition Arousal/Alertness: Awake/alert Behavior During Therapy: WFL for tasks assessed/performed Overall Cognitive Status: No family/caregiver present to determine baseline cognitive functioning Area of Impairment: Awareness;Orientation;Problem solving;Memory                 Orientation Level: Disoriented to;Place;Time;Situation   Memory: Decreased short-term memory     Awareness: Intellectual Problem Solving: Requires verbal cues;Requires tactile cues General Comments: Patient states she is on [address] at a restaurant. Pleasantly confused throughout session.      Exercises      General Comments        Pertinent Vitals/Pain Pain Assessment: No/denies pain  Home Living                      Prior Function            PT Goals (current goals can now be found in the care plan section) Acute Rehab PT Goals Patient Stated Goal: to go home PT Goal Formulation: With patient Time For Goal Achievement: 01/31/20 Potential to Achieve Goals: Fair Progress towards PT goals: Progressing toward goals    Frequency    Min  2X/week      PT Plan Current plan remains appropriate    Co-evaluation              AM-PAC PT "6 Clicks" Mobility   Outcome Measure  Help needed turning from your back to your side while in a flat bed without using bedrails?: A Little Help needed moving from lying on your back to sitting on the side of a flat bed without using bedrails?: A Little Help needed moving to and from a bed to a chair (including a wheelchair)?: Total Help needed standing up from a chair using your arms (e.g., wheelchair or bedside chair)?: Total Help needed to walk in hospital room?: Total Help needed climbing 3-5 steps with a railing? : Total 6 Click Score: 10    End of Session Equipment Utilized During Treatment: Gait belt Activity Tolerance: Patient tolerated treatment well Patient left: in bed;with call bell/phone within reach;with bed alarm set Nurse Communication: Mobility status PT Visit Diagnosis: Other abnormalities of gait and mobility (R26.89);Muscle weakness (generalized) (M62.81);Unsteadiness on feet (R26.81)     Time: 9417-4081 PT Time Calculation (min) (ACUTE ONLY): 23 min  Charges:  $Therapeutic Activity: 23-37 mins                     Deeksha Cotrell, PT, GCS 01/16/20,12:11 PM

## 2020-01-17 DIAGNOSIS — N390 Urinary tract infection, site not specified: Secondary | ICD-10-CM | POA: Diagnosis not present

## 2020-01-17 DIAGNOSIS — D519 Vitamin B12 deficiency anemia, unspecified: Secondary | ICD-10-CM

## 2020-01-17 DIAGNOSIS — R531 Weakness: Secondary | ICD-10-CM | POA: Diagnosis not present

## 2020-01-17 DIAGNOSIS — L89322 Pressure ulcer of left buttock, stage 2: Secondary | ICD-10-CM | POA: Diagnosis not present

## 2020-01-17 DIAGNOSIS — I872 Venous insufficiency (chronic) (peripheral): Secondary | ICD-10-CM | POA: Diagnosis not present

## 2020-01-17 MED ORDER — VITAMIN B-12 1000 MCG PO TABS
1000.0000 ug | ORAL_TABLET | Freq: Every day | ORAL | Status: DC
  Administered 2020-01-17 – 2020-01-21 (×5): 1000 ug via ORAL
  Filled 2020-01-17 (×5): qty 1

## 2020-01-17 NOTE — Care Management Important Message (Signed)
Important Message  Patient Details  Name: [name] [name] MRN: [id number] Date of Birth: 03-15-1926   Medicare Important Message Given:  Yes     [name] A [name] 01/17/2020, 11:33 AM

## 2020-01-17 NOTE — TOC Progression Note (Addendum)
Transition of Care [facility]) - Progression Note    Patient Details  Name: Suraya Vidrine MRN: [id number] Date of Birth: 02-Mar-1926  Transition of Care [facility]) CM/SW Contact  Liliana Cline, LCSW Phone Number: 01/17/2020, 10:46 AM  Clinical Narrative:        CSW called [facility]. She reported even though Monia Pouch has started Education administrator, the family will still need to appeal the denial that was made for this patient prior to waiver. She reported they cannot take patient until family does the appeal. Patient has no other bed offers at this time. CSW called patient's grandson, [name] who reported he plans to call today to start the appeal, he confirmed he still thinks patient needs SNF rehab.   4:30- Asked by MD to confirm grandson started appeal. Call to grandson [name] who confirmed he started the appeal.     Expected Discharge Plan: Skilled Nursing Facility Barriers to Discharge: No Barriers Identified  Expected Discharge Plan and Services Expected Discharge Plan: Skilled Nursing Facility     Post Acute Care Choice: Skilled Nursing Facility Living arrangements for the past 2 months: Single Family Home                                       Social Determinants of Health (SDOH) Interventions    Readmission Risk Interventions No flowsheet data found.

## 2020-01-17 NOTE — Progress Notes (Signed)
Patient ID: [name] [name], female   DOB: 1926/05/04, 87 y.o.   MRN: [id number] [facility] PROGRESS NOTE  Dawnelle Warman [id number]:[id number] DOB: January 03, 1927 DOA: 01/07/2020 PCP: Leotis Shames, MD  HPI/Subjective: Patient feels okay.  Offers no complaints.  States she is eating and drinking okay.  Admitted with UTI and weakness.  Objective: Vitals:   01/17/20 0803 01/17/20 1225  BP: (!) 107/45 (!) 116/44  Pulse: (!) 53 (!) 58  Resp: 16 16  Temp: 97.6 F (36.4 C) 98.5 F (36.9 C)  SpO2: 99% 100%    Intake/Output Summary (Last 24 hours) at 01/17/2020 1412 Last data filed at 01/17/2020 1036 Gross per 24 hour  Intake 600 ml  Output 400 ml  Net 200 ml   Filed Weights   01/07/20 0824  Weight: 88.3 kg    ROS: Review of Systems  Respiratory: Negative for shortness of breath.   Cardiovascular: Negative for chest pain.  Gastrointestinal: Negative for abdominal pain, nausea and vomiting.   Exam: Physical Exam HENT:     Head: Normocephalic.     Mouth/Throat:     Pharynx: No oropharyngeal exudate.  Eyes:     General: Lids are normal.     Conjunctiva/sclera: Conjunctivae normal.     Pupils: Pupils are equal, round, and reactive to light.  Cardiovascular:     Rate and Rhythm: Normal rate and regular rhythm.     Heart sounds: Normal heart sounds, S1 normal and S2 normal.  Pulmonary:     Breath sounds: No decreased breath sounds, wheezing or rhonchi.  Abdominal:     Palpations: Abdomen is soft.     Tenderness: There is no abdominal tenderness.  Musculoskeletal:     Right ankle: Swelling present.     Left ankle: Swelling present.  Skin:    General: Skin is warm.     Comments: Chronic lower extremity discoloration  Neurological:     Mental Status: She is alert.     Comments: Answers some simple questions.       Data Reviewed: Basic Metabolic Panel: Recent Labs  Lab 01/11/20 0500 01/12/20 0850 01/13/20 0904 01/14/20 0740  NA 137 136 136 137  K 4.6 4.8 4.5 4.6   CL 105 101 101 101  CO2 27 29 28 29   GLUCOSE 93 113* 90 90  BUN 18 20 27* 28*  CREATININE 0.84 0.88 0.88 1.02*  CALCIUM 8.9 9.3 9.1 9.2   CBC: Recent Labs  Lab 01/11/20 0500 01/12/20 0850 01/13/20 0904 01/14/20 0740  WBC 3.6* 2.9* 2.9* 2.9*  HGB 9.8* 11.8* 11.6* 12.0  HCT 31.9* 36.7 37.2 39.3  MCV 95.8 93.6 94.9 96.8  PLT 240 270 273 295     Recent Results (from the past 240 hour(s))  Urine Culture     Status: Abnormal   Collection Time: 01/07/20  2:57 PM   Specimen: Urine, Random  Result Value Ref Range Status   Specimen Description   Final    URINE, RANDOM Performed at [facility], [address], [place], [place]    Special Requests   Final    NONE Performed at [facility], [address] [place], [place]    Culture >=100,000 COLONIES/mL ESCHERICHIA COLI (A)  Final   Report Status 01/10/2020 FINAL  Final   Organism ID, Bacteria ESCHERICHIA COLI (A)  Final      Susceptibility   Escherichia coli - MIC*    AMPICILLIN >=32 RESISTANT Resistant     CEFAZOLIN 16  SENSITIVE Sensitive     CEFEPIME <=0.12 SENSITIVE Sensitive     CEFTRIAXONE <=0.25 SENSITIVE Sensitive     CIPROFLOXACIN <=0.25 SENSITIVE Sensitive     GENTAMICIN <=1 SENSITIVE Sensitive     IMIPENEM <=0.25 SENSITIVE Sensitive     NITROFURANTOIN <=16 SENSITIVE Sensitive     TRIMETH/SULFA <=20 SENSITIVE Sensitive     AMPICILLIN/SULBACTAM >=32 RESISTANT Resistant     PIP/TAZO <=4 SENSITIVE Sensitive     * >=100,000 COLONIES/mL ESCHERICHIA COLI  MRSA PCR Screening     Status: None   Collection Time: 01/09/20  9:01 PM   Specimen: Nasopharyngeal  Result Value Ref Range Status   MRSA by PCR NEGATIVE NEGATIVE Final    Comment:        The GeneXpert MRSA Assay (FDA approved for NASAL specimens only), is one component of a comprehensive MRSA colonization surveillance program. It is not intended to diagnose MRSA infection nor to guide or monitor treatment for MRSA  infections. Performed at [facility], [address] [place], [place] [address]       Scheduled Meds: . enoxaparin (LOVENOX) injection  40 mg Subcutaneous Q24H  . sodium chloride flush  3 mL Intravenous Q12H  . vitamin B-12  1,000 mcg Oral Daily   Continuous Infusions: . sodium chloride      Assessment/Plan:  1. Generalized weakness.  The patient was able to mobilize yesterday in the bed but unable to stand up without being held up by physical therapy.  Insurance company denied authorization for rehab.  Spoke with the grandson and he states he is working on the appeal.  I mentioned if he does not do the appeal I have to discharge her home. 2. E. coli UTI.  Completed antibiotics 3. Stage II decubitus ulcer on left ischial tuberosity, present on admission.  See description below 4. Chronic stasis dermatitis bilateral lower extremities 5. Acute metabolic encephalopathy.  Patient has borderline low B12.  I suspect an underlying dementia.  Replace B12 orally. 6. B12 deficiency replace B12 orally.  Pressure Injury 11/05/16 Stage II -  Partial thickness loss of dermis presenting as a shallow open ulcer with a red, pink wound bed without slough. granulated tissue (Active)  11/05/16 0153  Location: Ischial tuberosity  Location Orientation: Left  Staging: Stage II -  Partial thickness loss of dermis presenting as a shallow open ulcer with a red, pink wound bed without slough.  Wound Description (Comments): granulated tissue  Present on Admission: Yes       Code Status:     Code Status Orders  (From admission, onward)         Start     Ordered   01/07/20 1721  Full code  Continuous        01/07/20 1722        Code Status History    Date Active Date Inactive Code Status Order ID Comments User Context   03/06/2017 1958 03/09/2017 1852 Full Code [id number]  Gorden Harms, MD Inpatient   11/25/2016 0238 11/28/2016 2229 Full Code [id number]  Lance Coon, MD Inpatient    11/05/2016 0104 11/07/2016 2237 Full Code [id number]  Harrie Foreman, MD ED   Advance Care Planning Activity    Advance Directive Documentation   Flowsheet Row Most Recent Value  Type of Advance Directive Living will  Pre-existing out of facility DNR order (yellow form or pink MOST form) --  "MOST" Form in Place? --     Family Communication: Spoke with  grandson on the phone Disposition Plan: Status is: Inpatient  Dispo: The patient is from: Home              Anticipated d/c is to: [place] appealing insurance company's decision about rehab              Anticipated d/c date is: We will have to go through appeals process.              Patient currently medically stable to go out to rehab.  Time spent: 26 minutes, case discussed with transitional care team  [employer]  [facility]

## 2020-01-18 DIAGNOSIS — L89322 Pressure ulcer of left buttock, stage 2: Secondary | ICD-10-CM | POA: Diagnosis not present

## 2020-01-18 DIAGNOSIS — R531 Weakness: Secondary | ICD-10-CM | POA: Diagnosis not present

## 2020-01-18 DIAGNOSIS — I872 Venous insufficiency (chronic) (peripheral): Secondary | ICD-10-CM | POA: Diagnosis not present

## 2020-01-18 DIAGNOSIS — E538 Deficiency of other specified B group vitamins: Secondary | ICD-10-CM

## 2020-01-18 DIAGNOSIS — N390 Urinary tract infection, site not specified: Secondary | ICD-10-CM | POA: Diagnosis not present

## 2020-01-18 LAB — CREATININE, SERUM
Creatinine, Ser: 1.05 mg/dL — ABNORMAL HIGH (ref 0.44–1.00)
GFR, Estimated: 50 mL/min — ABNORMAL LOW (ref >60)

## 2020-01-18 NOTE — Progress Notes (Signed)
Patient ID: [name] [name], female   DOB: [birth year], 87 y.o.   MRN: [id number] [facility] PROGRESS NOTE  Lisaann Atha HKV:[id number] DOB: Oct 29, 1926 DOA: 01/07/2020 PCP: Leotis Shames, MD  HPI/Subjective: Patient states that she feels cold this morning and under the blankets.  No fever.  No cough.  No abdominal pain.  No diarrhea or burning on urination.  Patient admitted with acute cystitis and weakness  Objective: Vitals:   01/18/20 1131 01/18/20 1632  BP: (!) 142/89 (!) 106/56  Pulse: (!) 54 (!) 54  Resp: 18 16  Temp: 98.4 F (36.9 C) 98.8 F (37.1 C)  SpO2: 99% 96%    Intake/Output Summary (Last 24 hours) at 01/18/2020 1645 Last data filed at 01/18/2020 1103 Gross per 24 hour  Intake 120 ml  Output 1000 ml  Net -880 ml   Filed Weights   01/07/20 0824  Weight: 88.3 kg    ROS: Review of Systems  Constitutional: Negative for fever.  Respiratory: Negative for shortness of breath.   Cardiovascular: Negative for chest pain.  Gastrointestinal: Negative for abdominal pain and diarrhea.  Genitourinary: Negative for dysuria.   Exam: Physical Exam HENT:     Head: Normocephalic.     Mouth/Throat:     Pharynx: No oropharyngeal exudate.  Eyes:     General: Lids are normal.     Conjunctiva/sclera: Conjunctivae normal.  Cardiovascular:     Rate and Rhythm: Regular rhythm. Bradycardia present.     Heart sounds: Normal heart sounds, S1 normal and S2 normal.  Pulmonary:     Breath sounds: No decreased breath sounds, wheezing, rhonchi or rales.  Abdominal:     Palpations: Abdomen is soft.     Tenderness: There is no abdominal tenderness.  Musculoskeletal:     Right lower leg: Swelling present.     Left lower leg: Swelling present.  Skin:    General: Skin is warm.     Comments: Chronic lower extremity skin discoloration  Neurological:     Mental Status: She is alert.     Comments: Able to have more of a conversation today and answers questions appropriately.        Data Reviewed: Basic Metabolic Panel: Recent Labs  Lab 01/12/20 0850 01/13/20 0904 01/14/20 0740 01/18/20 0601  NA 136 136 137  --   K 4.8 4.5 4.6  --   CL 101 101 101  --   CO2 29 28 29   --   GLUCOSE 113* 90 90  --   BUN 20 27* 28*  --   CREATININE 0.88 0.88 1.02* 1.05*  CALCIUM 9.3 9.1 9.2  --    CBC: Recent Labs  Lab 01/12/20 0850 01/13/20 0904 01/14/20 0740  WBC 2.9* 2.9* 2.9*  HGB 11.8* 11.6* 12.0  HCT 36.7 37.2 39.3  MCV 93.6 94.9 96.8  PLT 270 273 295     Recent Results (from the past 240 hour(s))  MRSA PCR Screening     Status: None   Collection Time: 01/09/20  9:01 PM   Specimen: Nasopharyngeal  Result Value Ref Range Status   MRSA by PCR NEGATIVE NEGATIVE Final    Comment:        The GeneXpert MRSA Assay (FDA approved for NASAL specimens only), is one component of a comprehensive MRSA colonization surveillance program. It is not intended to diagnose MRSA infection nor to guide or monitor treatment for MRSA infections. Performed at [facility], [address], [place], [place]  Scheduled Meds: . enoxaparin (LOVENOX) injection  40 mg Subcutaneous Q24H  . sodium chloride flush  3 mL Intravenous Q12H  . vitamin B-12  1,000 mcg Oral Daily   Continuous Infusions: . sodium chloride      Assessment/Plan:  1. Generalized weakness.  Patient unable to stand without being held up by physical therapy.  Insurance company denied authorization for rehab.  As per transitional care team grandson started the appeal process. 2. E. coli UTI.  Completed course of antibiotics while here 3. Stage II decubitus ulcer of left ischial tuberosity, present on admission.  See full description below 4. Chronic stasis dermatitis of bilateral lower extremities. 5. Acute metabolic encephalopathy, I suspect underlying dementia 6. B12 deficiency.  Giving oral B12 for borderline low B12 level.  Pressure Injury 11/05/16 Stage II -  Partial  thickness loss of dermis presenting as a shallow open ulcer with a red, pink wound bed without slough. granulated tissue (Active)  11/05/16 0153  Location: Ischial tuberosity  Location Orientation: Left  Staging: Stage II -  Partial thickness loss of dermis presenting as a shallow open ulcer with a red, pink wound bed without slough.  Wound Description (Comments): granulated tissue  Present on Admission: Yes       Code Status:     Code Status Orders  (From admission, onward)         Start     Ordered   01/07/20 1721  Full code  Continuous        01/07/20 1722        Code Status History    Date Active Date Inactive Code Status Order ID Comments User Context   03/06/2017 1958 03/09/2017 1852 Full Code [id number]  Bertrum Sol, MD Inpatient   11/25/2016 0238 11/28/2016 2229 Full Code [id number]  Oralia Manis, MD Inpatient   11/05/2016 0104 11/07/2016 2237 Full Code [id number]  Arnaldo Natal, MD ED   Advance Care Planning Activity    Advance Directive Documentation   Flowsheet Row Most Recent Value  Type of Advance Directive Living will  Pre-existing out of facility DNR order (yellow form or pink MOST form) --  "MOST" Form in Place? --     Disposition Plan: Status is: Inpatient  Dispo: The patient is from: Home              Anticipated d/c is to: Rehab versus home depending on appeal process              Anticipated d/c date is: Depends on how long the appeal process goes              Patient currently medically stable.  Time spent: 25 minutes  [employer]

## 2020-01-19 DIAGNOSIS — N390 Urinary tract infection, site not specified: Secondary | ICD-10-CM | POA: Diagnosis not present

## 2020-01-19 DIAGNOSIS — I872 Venous insufficiency (chronic) (peripheral): Secondary | ICD-10-CM | POA: Diagnosis not present

## 2020-01-19 DIAGNOSIS — L89322 Pressure ulcer of left buttock, stage 2: Secondary | ICD-10-CM | POA: Diagnosis not present

## 2020-01-19 DIAGNOSIS — R531 Weakness: Secondary | ICD-10-CM | POA: Diagnosis not present

## 2020-01-19 NOTE — Progress Notes (Signed)
Patient ID: [name] [name], female   DOB: 20-Sep-1926, 87 y.o.   MRN: [id number] [facility] PROGRESS NOTE  [name] [name] [id number]:[id number] DOB: 27-Jul-1926 DOA: 01/07/2020 PCP: Leotis Shames, MD  HPI/Subjective: Patient feels okay today.  He ate breakfast this morning.  Offers no complaints.  Initially admitted with acute metabolic encephalopathy and UTI.  Objective: Vitals:   01/19/20 0819 01/19/20 1216  BP: (!) 162/67 135/61  Pulse: (!) 52 (!) 58  Resp: 16 15  Temp: 98 F (36.7 C) 98.1 F (36.7 C)  SpO2: 96% 97%    Filed Weights   01/07/20 0824  Weight: 88.3 kg    ROS: Review of Systems  Respiratory: Negative for shortness of breath.   Cardiovascular: Negative for chest pain.  Gastrointestinal: Negative for abdominal pain.   Exam: Physical Exam HENT:     Head: Normocephalic.     Mouth/Throat:     Pharynx: No oropharyngeal exudate.  Eyes:     General: Lids are normal.     Conjunctiva/sclera: Conjunctivae normal.  Cardiovascular:     Rate and Rhythm: Regular rhythm. Bradycardia present.     Heart sounds: Normal heart sounds, S1 normal and S2 normal.  Pulmonary:     Breath sounds: No decreased breath sounds, wheezing or rhonchi.  Abdominal:     Palpations: Abdomen is soft.     Tenderness: There is no abdominal tenderness.  Musculoskeletal:     Right ankle: Swelling present.     Left ankle: Swelling present.  Skin:    General: Skin is warm.     Comments: Chronic lower extremity skin discoloration  Neurological:     Mental Status: She is alert.     Comments: Answers some yes or no questions.       Data Reviewed: Basic Metabolic Panel: Recent Labs  Lab 01/13/20 0904 01/14/20 0740 01/18/20 0601  NA 136 137  --   K 4.5 4.6  --   CL 101 101  --   CO2 28 29  --   GLUCOSE 90 90  --   BUN 27* 28*  --   CREATININE 0.88 1.02* 1.05*  CALCIUM 9.1 9.2  --    Liver Function Tests: No results for input(s): AST, ALT, ALKPHOS, BILITOT, PROT, ALBUMIN in  the last 168 hours. No results for input(s): LIPASE, AMYLASE in the last 168 hours. No results for input(s): AMMONIA in the last 168 hours. CBC: Recent Labs  Lab 01/13/20 0904 01/14/20 0740  WBC 2.9* 2.9*  HGB 11.6* 12.0  HCT 37.2 39.3  MCV 94.9 96.8  PLT 273 295     Recent Results (from the past 240 hour(s))  MRSA PCR Screening     Status: None   Collection Time: 01/09/20  9:01 PM   Specimen: Nasopharyngeal  Result Value Ref Range Status   MRSA by PCR NEGATIVE NEGATIVE Final    Comment:        The GeneXpert MRSA Assay (FDA approved for NASAL specimens only), is one component of a comprehensive MRSA colonization surveillance program. It is not intended to diagnose MRSA infection nor to guide or monitor treatment for MRSA infections. Performed at [facility], [address] [place], [place] [address]       Scheduled Meds: . enoxaparin (LOVENOX) injection  40 mg Subcutaneous Q24H  . sodium chloride flush  3 mL Intravenous Q12H  . vitamin B-12  1,000 mcg Oral Daily   Continuous Infusions: . sodium chloride      Assessment/Plan:  1. Generalized weakness.  Insurance company denied authorization for rehab.  [name] has put in for the appeal on Friday.  Awaiting to hear back on results from the appeal.  Hopefully will hear back on Monday. 2. E. coli UTI.  Completed course of antibiotics while here. 3. Stage II decubitus ulcer of the left ischial tuberosity, present on admission.  See description below. 4. B12 deficiency.  Oral B12 supplementation 5. Chronic stasis dermatitis of bilateral lower extremities 6. Acute metabolic encephalopathy.  Patient's mental status waxes and wanes and I suspect underlying dementia.  Supplementing borderline low B12.  Pressure Injury 11/05/16 Stage II -  Partial thickness loss of dermis presenting as a shallow open ulcer with a red, pink wound bed without slough. granulated tissue (Active)  11/05/16 0153  Location: Ischial  tuberosity  Location Orientation: Left  Staging: Stage II -  Partial thickness loss of dermis presenting as a shallow open ulcer with a red, pink wound bed without slough.  Wound Description (Comments): granulated tissue  Present on Admission: Yes       Code Status:     Code Status Orders  (From admission, onward)         Start     Ordered   01/07/20 1721  Full code  Continuous        01/07/20 1722        Code Status History    Date Active Date Inactive Code Status Order ID Comments User Context   03/06/2017 1958 03/09/2017 1852 Full Code [id number]  Bertrum Sol, MD Inpatient   11/25/2016 0238 11/28/2016 2229 Full Code [id number]  Oralia Manis, MD Inpatient   11/05/2016 0104 11/07/2016 2237 Full Code [id number]  Arnaldo Natal, MD ED   Advance Care Planning Activity    Advance Directive Documentation   Flowsheet Row Most Recent Value  Type of Advance Directive Living will  Pre-existing out of facility DNR order (yellow form or pink MOST form) --  "MOST" Form in Place? --     Family Communication: Spoke with grandson on the phone Disposition Plan: Status is: Inpatient  Dispo: The patient is from: Home              Anticipated d/c is to: Awaiting appeal process for rehab with the insurance company.  Patient will go out to rehab if grandson winds the appeal.  Patient will have to go home if loses the appeal.              Anticipated d/c date is: Awaiting appeal process from the insurance company              Patient currently medically stable to get out of the hospital.  Time spent: 25 minutes  [name]

## 2020-01-20 DIAGNOSIS — R531 Weakness: Secondary | ICD-10-CM | POA: Diagnosis not present

## 2020-01-20 DIAGNOSIS — L89322 Pressure ulcer of left buttock, stage 2: Secondary | ICD-10-CM | POA: Diagnosis not present

## 2020-01-20 DIAGNOSIS — I872 Venous insufficiency (chronic) (peripheral): Secondary | ICD-10-CM | POA: Diagnosis not present

## 2020-01-20 DIAGNOSIS — N390 Urinary tract infection, site not specified: Secondary | ICD-10-CM | POA: Diagnosis not present

## 2020-01-20 MED ORDER — TRAZODONE HCL 50 MG PO TABS: 25.0000 mg | ORAL_TABLET | Freq: Every evening | ORAL | 0 refills | Status: DC | PRN

## 2020-01-20 MED ORDER — CYANOCOBALAMIN 1000 MCG PO TABS: 1000.0000 ug | ORAL_TABLET | Freq: Every day | ORAL | 0 refills | Status: DC

## 2020-01-20 NOTE — Evaluation (Signed)
Occupational Therapy RE- Evaluation Patient Details Name: [name] [name] MRN: [id number] DOB: [birth year] Today's Date: 01/20/2020    History of Present Illness 87 y.o. female with medical history significant of no major chronic medical problems.  She presents to the emergency department for altered mental status.  According to EMS report the daughter spoke to the patient this morning and she appeared to be confused.  Here the patient is awake and alert.  Oriented to person and place but not time.  Patient denies any symptoms.  Per report however the home health aide recently tested positive for Covid.  Patient denies any shortness of breath or cough.  States she feels "lousy" states she feels extremely weak   Clinical Impression   Upon entering the room, nurse tech present and changing bed linens. Pt donning clean gown with set up A this session. Pt declined transfer/standing attempt and reports she is fatigued. Pt required motivation for participation but demonstrates long sitting from supine flat bed with supervision and is able to put lotion on B LEs and feet with set up A only. Pt needing max cuing to orient to situation, location, and time. Pt oriented to self only. Pt brushing teeth with set up A and min cuing for sequencing of tasks. Pt making progress towards goals this session and was seen for re-evaluation of goals. Pt meeting 1/4 goals with remaining goals still appropriately set at mod A. Pt continues to benefit from OT intervention with recommendation for SNF at discharge.     Follow Up Recommendations  SNF;Supervision/Assistance - 24 hour    Equipment Recommendations  Other (comment) (defer to next venue of care)       Precautions / Restrictions Precautions Precautions: Fall Restrictions Weight Bearing Restrictions: No      Mobility Bed Mobility Overal bed mobility: Needs Assistance Bed Mobility: Supine to Sit;Sit to Supine     Supine to sit: Supervision Sit to supine:  Min assist;Mod assist;+2 for safety/equipment   General bed mobility comments: Pt performed long sitting with supervision    Transfers Overall transfer level: Needs assistance Equipment used: Rolling walker (2 wheeled) Transfers: Sit to/from Stand Sit to Stand: Max assist;+2 physical assistance         General transfer comment: unable to bring hips off bed despite +2 assist and raised surface    Balance Overall balance assessment: Needs assistance Sitting-balance support: Feet supported Sitting balance-Leahy Scale: Good     Standing balance support: Bilateral upper extremity supported;During functional activity Standing balance-Leahy Scale: Zero Standing balance comment: unable to achieve standing balance even with max A of 2                           ADL either performed or assessed with clinical judgement   ADL Overall ADL's : Needs assistance/impaired     Grooming: Wash/dry hands;Set up;Oral care;Sitting           Upper Body Dressing : Set up;Bed level Upper Body Dressing Details (indicate cue type and reason): hospital gown                         Vision Patient Visual Report: No change from baseline              Pertinent Vitals/Pain Pain Assessment: No/denies pain              Cognition Arousal/Alertness: Awake/alert Behavior During Therapy: WFL for tasks assessed/performed Overall  Cognitive Status: No family/caregiver present to determine baseline cognitive functioning Area of Impairment: Awareness;Orientation;Problem solving;Memory                 Orientation Level: Disoriented to;Place;Time;Situation   Memory: Decreased short-term memory     Awareness: Intellectual Problem Solving: Requires verbal cues;Requires tactile cues General Comments: cooperative and smiling throughout but does need encouragement because she wants to nap. She reports she is "102", it's "dec", and unable to verbalize year or location.       Exercises Other Exercises Other Exercises: BLE supine ex x 10                             OT Goals(Current goals can be found in the care plan section) Acute Rehab OT Goals Patient Stated Goal: to go home OT Goal Formulation: With patient Time For Goal Achievement: 02/03/20  OT Frequency: Min 1X/week       End of Session Equipment Utilized During Treatment: Engineer, water Communication: Mobility status  Activity Tolerance: Patient tolerated treatment well Patient left: in bed;with call bell/phone within reach;with bed alarm set;with nursing/sitter in room  OT Visit Diagnosis: Unsteadiness on feet (R26.81);Muscle weakness (generalized) (M62.81)                Time: 1062-6948 OT Time Calculation (min): 24 min Charges:  OT General Charges $OT Visit: 1 Visit OT Evaluation $OT Re-eval: 1 Re-eval OT Treatments $Self Care/Home Management : 23-37 mins   Jackquline Denmark, MS, OTR/L , CBIS ascom [phone]  01/20/20, 3:52 PM  01/20/2020, 3:52 PM

## 2020-01-20 NOTE — Progress Notes (Signed)
Patient ID: Anesha Hackert, female   DOB: [birth year], 87 y.o.   MRN: [id number] [facility] PROGRESS NOTE  Alvah Gilder WLN:[id number] DOB: December 20, 1926 DOA: 01/07/2020 PCP: Leotis Shames, MD  HPI/Subjective: Patient feels okay.  Offers no complaints.  Patient able to mobilize in the bed but unable to stand.  Objective: Vitals:   01/20/20 0724 01/20/20 1132  BP: 124/60 118/73  Pulse: (!) 51 (!) 50  Resp: 16 17  Temp: 98.3 F (36.8 C) 98.9 F (37.2 C)  SpO2: 100% 100%    Intake/Output Summary (Last 24 hours) at 01/20/2020 1440 Last data filed at 01/20/2020 1100 Gross per 24 hour  Intake 360 ml  Output 850 ml  Net -490 ml   Filed Weights   01/07/20 0824  Weight: 88.3 kg    ROS: Review of Systems  Respiratory: Negative for shortness of breath.   Cardiovascular: Negative for chest pain.  Gastrointestinal: Negative for abdominal pain, nausea and vomiting.   Exam: Physical Exam HENT:     Head: Normocephalic.     Mouth/Throat:     Pharynx: No oropharyngeal exudate.  Eyes:     General: Lids are normal.     Conjunctiva/sclera: Conjunctivae normal.  Cardiovascular:     Rate and Rhythm: Regular rhythm. Bradycardia present.     Heart sounds: Normal heart sounds, S1 normal and S2 normal.  Pulmonary:     Breath sounds: No decreased breath sounds, wheezing, rhonchi or rales.  Abdominal:     Palpations: Abdomen is soft.     Tenderness: There is no abdominal tenderness.  Musculoskeletal:     Right lower leg: Swelling present.     Left lower leg: Swelling present.  Skin:    General: Skin is warm.     Comments: Chronic lower extremity skin discoloration.  Neurological:     Mental Status: She is alert.     Comments: Answers questions appropriately.       Data Reviewed: Basic Metabolic Panel: Recent Labs  Lab 01/14/20 0740 01/18/20 0601  NA 137  --   K 4.6  --   CL 101  --   CO2 29  --   GLUCOSE 90  --   BUN 28*  --   CREATININE 1.02* 1.05*  CALCIUM  9.2  --    CBC: Recent Labs  Lab 01/14/20 0740  WBC 2.9*  HGB 12.0  HCT 39.3  MCV 96.8  PLT 295    Scheduled Meds: . enoxaparin (LOVENOX) injection  40 mg Subcutaneous Q24H  . sodium chloride flush  3 mL Intravenous Q12H  . vitamin B-12  1,000 mcg Oral Daily   Continuous Infusions: . sodium chloride      Assessment/Plan:  1. Generalized weakness.  Insurance company denied authorization for rehab and the patient's grandson put in for the appeal.  We will waiting to hear back from the appeal but the one rehab facility that was going to accept her has just rescinded the bed offer secondary to no COVID immunizations.  Identified the patient's grandson just now.  He will need to set up care at home for her for tomorrow. 2. E. coli UTI.  Completed course of antibiotics 3. B12 deficiency on oral B12 supplementation 4. Chronic venous stasis dermatitis of bilateral lower extremities. 5. Stage II decubitus ulcer of the left ischial tuberosity, present on admission.  See description below. 6. Acute metabolic encephalopathy.  Patient's mental status has gotten better over the last couple days but still waxes and wanes I  do suspect suspect an underlying dementia.  I think this is better with B12 supplementation.  Pressure Injury 11/05/16 Stage II -  Partial thickness loss of dermis presenting as a shallow open ulcer with a red, pink wound bed without slough. granulated tissue (Active)  11/05/16 0153  Location: Ischial tuberosity  Location Orientation: Left  Staging: Stage II -  Partial thickness loss of dermis presenting as a shallow open ulcer with a red, pink wound bed without slough.  Wound Description (Comments): granulated tissue  Present on Admission: Yes       Code Status:     Code Status Orders  (From admission, onward)         Start     Ordered   01/07/20 1721  Full code  Continuous        01/07/20 1722        Code Status History    Date Active Date Inactive Code  Status Order ID Comments User Context   03/06/2017 1958 03/09/2017 1852 Full Code [id number]  Bertrum Sol, MD Inpatient   11/25/2016 0238 11/28/2016 2229 Full Code [id number]  Oralia Manis, MD Inpatient   11/05/2016 0104 11/07/2016 2237 Full Code [id number]  Arnaldo Natal, MD ED   Advance Care Planning Activity    Advance Directive Documentation   Flowsheet Row Most Recent Value  Type of Advance Directive Living will  Pre-existing out of facility DNR order (yellow form or pink MOST form) -  "MOST" Form in Place? -     Family Communication: Spoke with grandson on the phone Disposition Plan: Status is: Inpatient  Dispo: The patient is from: Home              Anticipated d/c is to: Home with home health              Anticipated d/c date is: 01/21/2020.  Son will set up help at home to take care of her for tomorrow.              Patient currently has been medically stable.  We were waiting for the appeal process from the insurance company denying rehab.  The 1 rehab bed that was offered has now rescinded that bed secondary to the patient not being vaccinated for COVID.  We will have to send the patient home with home health tomorrow.  Time spent: 27 minutes  Zykeriah Mathia [employer]

## 2020-01-20 NOTE — Progress Notes (Signed)
Physical Therapy Treatment Patient Details Name: [name] [name] MRN: [id number] DOB: September 17, 1926 Today's Date: 01/20/2020    History of Present Illness 87 y.o. female with medical history significant of no major chronic medical problems.  She presents to the emergency department for altered mental status.  According to EMS report the daughter spoke to the patient this morning and she appeared to be confused.  Here the patient is awake and alert.  Oriented to person and place but not time.  Patient denies any symptoms.  Per report however the home health aide recently tested positive for Covid.  Patient denies any shortness of breath or cough.  States she feels "lousy" states she feels extremely weak    PT Comments    Participated in exercises as described below.  Pt continues to do quite well with bed mobility but standing remains challenging as she is unable to stand despite +2 and elevated surface.  She is appropriate for sit to stand lift for transfers as needed with staff.    Follow Up Recommendations  SNF;Supervision for mobility/OOB;Supervision/Assistance - 24 hour     Equipment Recommendations       Recommendations for Other Services       Precautions / Restrictions Precautions Precautions: Fall Restrictions Weight Bearing Restrictions: No    Mobility  Bed Mobility Overal bed mobility: Needs Assistance Bed Mobility: Supine to Sit;Sit to Supine     Supine to sit: Supervision Sit to supine: Min assist;Mod assist;+2 for safety/equipment      Transfers Overall transfer level: Needs assistance Equipment used: Rolling walker (2 wheeled) Transfers: Sit to/from Stand Sit to Stand: Max assist;+2 physical assistance         General transfer comment: unable to bring hips off bed despite +2 assist and raised surface  Ambulation/Gait                 Stairs             Wheelchair Mobility    Modified Rankin (Stroke Patients Only)       Balance  Overall balance assessment: Needs assistance Sitting-balance support: Feet supported Sitting balance-Leahy Scale: Good     Standing balance support: Bilateral upper extremity supported;During functional activity Standing balance-Leahy Scale: Zero Standing balance comment: unable to achieve standing balance even with max A of 2                            Cognition Arousal/Alertness: Awake/alert Behavior During Therapy: WFL for tasks assessed/performed Overall Cognitive Status: No family/caregiver present to determine baseline cognitive functioning                                        Exercises Other Exercises Other Exercises: BLE supine ex x 10    General Comments        Pertinent Vitals/Pain Pain Assessment: No/denies pain    Home Living                      Prior Function            PT Goals (current goals can now be found in the care plan section) Progress towards PT goals: Progressing toward goals    Frequency    Min 2X/week      PT Plan Current plan remains appropriate    Co-evaluation  AM-PAC PT "6 Clicks" Mobility   Outcome Measure  Help needed turning from your back to your side while in a flat bed without using bedrails?: A Little Help needed moving from lying on your back to sitting on the side of a flat bed without using bedrails?: A Little Help needed moving to and from a bed to a chair (including a wheelchair)?: Total Help needed standing up from a chair using your arms (e.g., wheelchair or bedside chair)?: Total Help needed to walk in hospital room?: Total Help needed climbing 3-5 steps with a railing? : Total 6 Click Score: 10    End of Session Equipment Utilized During Treatment: Gait belt Activity Tolerance: Patient tolerated treatment well Patient left: in bed;with call bell/phone within reach;with bed alarm set Nurse Communication: Mobility status       Time: 1145-1157 PT  Time Calculation (min) (ACUTE ONLY): 12 min  Charges:  $Therapeutic Exercise: 8-22 mins                    Danielle Dess, PTA 01/20/20, 1:09 PM

## 2020-01-20 NOTE — Care Management Important Message (Signed)
Important Message  Patient Details  Name: [name] [name] MRN: [id number] Date of Birth: 1926-02-05   Medicare Important Message Given:  Yes     Johnell Comings 01/20/2020, 11:19 AM

## 2020-01-20 NOTE — TOC Progression Note (Signed)
Transition of Care [facility]) - Progression Note    Patient Details  Name: [name] [name] MRN: [id number] Date of Birth: Jan 26, 1926  [facility] CM/SW Contact  Trenton Founds, RN Phone Number: 01/20/2020, 2:12 PM  Clinical Narrative:   RNCM received phone call from [facility] with the [facility] in [place]. Due to the fact that their facility began accepting Covid positive patients today they will not be able to accept anyone that is not vaccinated and therefor even though family is in appeals process to get patient into facility they will not be able to offer the bed.     Expected Discharge Plan: Skilled Nursing Facility Barriers to Discharge: No Barriers Identified  Expected Discharge Plan and Services Expected Discharge Plan: Skilled Nursing Facility     Post Acute Care Choice: Skilled Nursing Facility Living arrangements for the past 2 months: Single Family Home                                       Social Determinants of Health (SDOH) Interventions    Readmission Risk Interventions No flowsheet data found.

## 2020-01-21 DIAGNOSIS — E538 Deficiency of other specified B group vitamins: Secondary | ICD-10-CM | POA: Diagnosis not present

## 2020-01-21 DIAGNOSIS — N1832 Chronic kidney disease, stage 3b: Secondary | ICD-10-CM

## 2020-01-21 DIAGNOSIS — R531 Weakness: Secondary | ICD-10-CM | POA: Diagnosis not present

## 2020-01-21 DIAGNOSIS — I872 Venous insufficiency (chronic) (peripheral): Secondary | ICD-10-CM | POA: Diagnosis not present

## 2020-01-21 DIAGNOSIS — N390 Urinary tract infection, site not specified: Secondary | ICD-10-CM | POA: Diagnosis not present

## 2020-01-21 LAB — RESP PANEL BY RT-PCR (FLU A&B, COVID) ARPGX2
Influenza A by PCR: NEGATIVE
Influenza B by PCR: NEGATIVE
SARS Coronavirus 2 by RT PCR: NEGATIVE

## 2020-01-21 LAB — CREATININE, SERUM
Creatinine, Ser: 1.29 mg/dL — ABNORMAL HIGH (ref 0.44–1.00)
GFR, Estimated: 39 mL/min — ABNORMAL LOW (ref >60)

## 2020-01-21 MED ORDER — SODIUM CHLORIDE 0.9 % IV BOLUS: 250.0000 mL | INTRAVENOUS | Status: DC

## 2020-01-21 MED ORDER — SODIUM CHLORIDE 0.9 % IV BOLUS: 250.0000 mL | Freq: Once | INTRAVENOUS | Status: DC

## 2020-01-21 NOTE — Discharge Summary (Signed)
[facility] [place] [facility]   PATIENT NAME: [name] [name]    MR#:  [id number]  DATE OF BIRTH:  1926-02-26  DATE OF ADMISSION:  01/07/2020 ADMITTING PHYSICIAN: Charise KillianJamiese M Williams, MD  DATE OF DISCHARGE: 01/21/2020  PRIMARY CARE PHYSICIAN: Dr. Clydie Braunavid Fitzgerald   ADMISSION DIAGNOSIS:  Lower urinary tract infectious disease [N39.0] UTI (urinary tract infection) [N39.0] Altered mental status, unspecified altered mental status type [R41.82]  DISCHARGE DIAGNOSIS:  Active Problems:   Decubitus ulcer of ischium, stage 2, left (HCC)   Acute lower UTI   E. coli UTI   Chronic venous stasis dermatitis of both lower extremities   General weakness   Confusion   Acute metabolic encephalopathy   Anemia due to vitamin B12 deficiency   B12 deficiency    HOSPITAL COURSE:   1.  E. coli UTI.  The patient completed a course of antibiotics during the hospital course. 2.  Generalized weakness.  The insurance company denied authorization for rehab.  The patient's grandson put in for an appeal.  We will waiting to hear back from the appeal but the only one facility that was going to give her a bed had rescinded their bed offer secondary to no COVID immunization.  I notified the patient's grandson and he needed a day to set up care for her.  Home health set up.  Equipment ordered.  Equipment will be delivered this morning and patient will be transported this afternoon. 3.  Vitamin B12 deficiency on oral B12 supplementation. 4.  Chronic venous stasis dermatitis of bilateral lower extremities. 5.  Stage II decubitus ulcer of the left ischial tuberosity, present on admission.  Partial thickness loss of the dermis presenting a shallow open ulcer with a red-pink wound bed without slough. 6.  Acute metabolic encephalopathy.  The patient's mental status has seemed to get better over the last few days with me replacing B12.  I do suspect the patient has underlying dementia. 7.  Chronic  kidney disease stage IIIb.  Likely secondary to hydration status.  Patient had lost IV access and can only go with oral supplementation at this point.  DISCHARGE CONDITIONS:   Satisfactory  CONSULTS OBTAINED:  Note  DRUG ALLERGIES:  No Known Allergies  DISCHARGE MEDICATIONS:   Allergies as of 01/21/2020   No Known Allergies     Medication List    TAKE these medications   acetaminophen 325 MG tablet Commonly known as: TYLENOL Take 2 tablets (650 mg total) every 6 (six) hours as needed by mouth for mild pain (or Fever >/= 101).   cyanocobalamin 1000 MCG tablet Take 1 tablet (1,000 mcg total) by mouth daily.   traZODone 50 MG tablet Commonly known as: DESYREL Take 0.5 tablets (25 mg total) by mouth at bedtime as needed for sleep.            Durable Medical Equipment  (From admission, onward)         Start     Ordered   01/21/20 0831  For home use only DME standard manual wheelchair with seat cushion  Once       Comments: Patient suffers from the inability to stand which impairs their ability to perform daily activities like getting around in the home.  A walker will not resolve issue with performing activities of daily living. A wheelchair will allow patient to safely perform daily activities. Patient can safely propel the wheelchair in the home or has a caregiver who can provide assistance. Length  of need lifetime. Accessories: elevating leg rests (ELRs), wheel locks, extensions and anti-tippers.   01/21/20 0831   01/21/20 0830  For home use only DME 3 n 1  Once        01/21/20 0829   01/21/20 0829  For home use only DME Hospital bed  Once       Question Answer Comment  Length of Need Lifetime   Patient has (list medical condition): bedbound   The above medical condition requires: Patient requires the ability to reposition frequently   Head must be elevated greater than: 30 degrees   Bed type Semi-electric   Support Surface: Gel Overlay      01/21/20 0829            DISCHARGE INSTRUCTIONS:   Follow-up PMD 5 days  If you experience worsening of your admission symptoms, develop shortness of breath, life threatening emergency, suicidal or homicidal thoughts you must seek medical attention immediately by calling 911 or calling your MD immediately  if symptoms less severe.  You Must read complete instructions/literature along with all the possible adverse reactions/side effects for all the Medicines you take and that have been prescribed to you. Take any new Medicines after you have completely understood and accept all the possible adverse reactions/side effects.   Please note  You were cared for by a hospitalist during your hospital stay. If you have any questions about your discharge medications or the care you received while you were in the hospital after you are discharged, you can call the unit and asked to speak with the hospitalist on call if the hospitalist that took care of you is not available. Once you are discharged, your primary care physician will handle any further medical issues. Please note that NO REFILLS for any discharge medications will be authorized once you are discharged, as it is imperative that you return to your primary care physician (or establish a relationship with a primary care physician if you do not have one) for your aftercare needs so that they can reassess your need for medications and monitor your lab values.    Today   CHIEF COMPLAINT:   Chief Complaint  Patient presents with  . Altered Mental Status  . Wellness Check    HISTORY OF PRESENT ILLNESS:  [name] [name]  is a 87 y.o. female came in with altered mental status and found to have UTI   VITAL SIGNS:  Blood pressure (!) 126/58, pulse (!) 55, temperature 97.9 F (36.6 C), resp. rate 18, height 5\' 7"  (1.702 m), weight 88.3 kg, SpO2 100 %.   PHYSICAL EXAMINATION:  GENERAL:  87 y.o.-year-old patient lying in the bed with no acute distress.  EYES:  Pupils equal, round, reactive to light and accommodation. No scleral icterus. HEENT: Head atraumatic, normocephalic. Oropharynx and nasopharynx clear.  LUNGS: Normal breath sounds bilaterally, no wheezing, rales,rhonchi or crepitation. No use of accessory muscles of respiration.  CARDIOVASCULAR: S1, S2 normal. No murmurs, rubs, or gallops.  ABDOMEN: Soft, non-tender, non-distended.  EXTREMITIES: Trace pedal edema, cyanosis.  NEUROLOGIC: Patient is alert and answers questions.  Able to straight leg raise. PSYCHIATRIC: The patient is alert and answers questions SKIN: Chronic lower extremity skin discoloration  DATA REVIEW:    Chemistries  Recent Labs  Lab 01/21/20 0629  CREATININE 1.29*    Microbiology Results  Results for orders placed or performed during the hospital encounter of 01/07/20  Resp Panel by RT-PCR (Flu A&B, Covid) Nasopharyngeal Swab  Status: None   Collection Time: 01/07/20  8:32 AM   Specimen: Nasopharyngeal Swab; Nasopharyngeal(NP) swabs in vial transport medium  Result Value Ref Range Status   SARS Coronavirus 2 by RT PCR NEGATIVE NEGATIVE Final    Comment: (NOTE) SARS-CoV-2 target nucleic acids are NOT DETECTED.  The SARS-CoV-2 RNA is generally detectable in upper respiratory specimens during the acute phase of infection. The lowest concentration of SARS-CoV-2 viral copies this assay can detect is 138 copies/mL. A negative result does not preclude SARS-Cov-2 infection and should not be used as the sole basis for treatment or other patient management decisions. A negative result may occur with  improper specimen collection/handling, submission of specimen other than nasopharyngeal swab, presence of viral mutation(s) within the areas targeted by this assay, and inadequate number of viral copies(<138 copies/mL). A negative result must be combined with clinical observations, patient history, and epidemiological information. The expected result is  Negative.  Fact Sheet for Patients:  BloggerCourse.com  Fact Sheet for Healthcare Providers:  SeriousBroker.it  This test is no t yet approved or cleared by the [place] FDA and  has been authorized for detection and/or diagnosis of SARS-CoV-2 by FDA under an Emergency Use Authorization (EUA). This EUA will remain  in effect (meaning this test can be used) for the duration of the COVID-19 declaration under Section 564(b)(1) of the Act, 21 U.S.C.section 360bbb-3(b)(1), unless the authorization is terminated  or revoked sooner.       Influenza A by PCR NEGATIVE NEGATIVE Final   Influenza B by PCR NEGATIVE NEGATIVE Final    Comment: (NOTE) The Xpert Xpress SARS-CoV-2/FLU/RSV plus assay is intended as an aid in the diagnosis of influenza from Nasopharyngeal swab specimens and should not be used as a sole basis for treatment. Nasal washings and aspirates are unacceptable for Xpert Xpress SARS-CoV-2/FLU/RSV testing.  Fact Sheet for Patients: BloggerCourse.com  Fact Sheet for Healthcare Providers: SeriousBroker.it  This test is not yet approved or cleared by the [place] FDA and has been authorized for detection and/or diagnosis of SARS-CoV-2 by FDA under an Emergency Use Authorization (EUA). This EUA will remain in effect (meaning this test can be used) for the duration of the COVID-19 declaration under Section 564(b)(1) of the Act, 21 U.S.C. section 360bbb-3(b)(1), unless the authorization is terminated or revoked.  Performed at [facility], [address], [place], [place] [address]   Urine Culture     Status: Abnormal   Collection Time: 01/07/20  2:57 PM   Specimen: Urine, Random  Result Value Ref Range Status   Specimen Description   Final    URINE, RANDOM Performed at [facility], [address] [place], [place] [address]    Special  Requests   Final    NONE Performed at [facility], [address] [place], [place] [address]    Culture >=100,000 COLONIES/mL ESCHERICHIA COLI (A)  Final   Report Status 01/10/2020 FINAL  Final   Organism ID, Bacteria ESCHERICHIA COLI (A)  Final      Susceptibility   Escherichia coli - MIC*    AMPICILLIN >=32 RESISTANT Resistant     CEFAZOLIN 16 SENSITIVE Sensitive     CEFEPIME <=0.12 SENSITIVE Sensitive     CEFTRIAXONE <=0.25 SENSITIVE Sensitive     CIPROFLOXACIN <=0.25 SENSITIVE Sensitive     GENTAMICIN <=1 SENSITIVE Sensitive     IMIPENEM <=0.25 SENSITIVE Sensitive     NITROFURANTOIN <=16 SENSITIVE Sensitive     TRIMETH/SULFA <=20 SENSITIVE Sensitive  AMPICILLIN/SULBACTAM >=32 RESISTANT Resistant     PIP/TAZO <=4 SENSITIVE Sensitive     * >=100,000 COLONIES/mL ESCHERICHIA COLI  MRSA PCR Screening     Status: None   Collection Time: 01/09/20  9:01 PM   Specimen: Nasopharyngeal  Result Value Ref Range Status   MRSA by PCR NEGATIVE NEGATIVE Final    Comment:        The GeneXpert MRSA Assay (FDA approved for NASAL specimens only), is one component of a comprehensive MRSA colonization surveillance program. It is not intended to diagnose MRSA infection nor to guide or monitor treatment for MRSA infections. Performed at [facility], [address] [place], [place] [address]   Resp Panel by RT-PCR (Flu A&B, Covid) Nasopharyngeal Swab     Status: None   Collection Time: 01/21/20 10:37 AM   Specimen: Nasopharyngeal Swab; Nasopharyngeal(NP) swabs in vial transport medium  Result Value Ref Range Status   SARS Coronavirus 2 by RT PCR NEGATIVE NEGATIVE Final    Comment: (NOTE) SARS-CoV-2 target nucleic acids are NOT DETECTED.  The SARS-CoV-2 RNA is generally detectable in upper respiratory specimens during the acute phase of infection. The lowest concentration of SARS-CoV-2 viral copies this assay can detect is 138 copies/mL. A negative result does not  preclude SARS-Cov-2 infection and should not be used as the sole basis for treatment or other patient management decisions. A negative result may occur with  improper specimen collection/handling, submission of specimen other than nasopharyngeal swab, presence of viral mutation(s) within the areas targeted by this assay, and inadequate number of viral copies(<138 copies/mL). A negative result must be combined with clinical observations, patient history, and epidemiological information. The expected result is Negative.  Fact Sheet for Patients:  BloggerCourse.com  Fact Sheet for Healthcare Providers:  SeriousBroker.it  This test is no t yet approved or cleared by the [place] FDA and  has been authorized for detection and/or diagnosis of SARS-CoV-2 by FDA under an Emergency Use Authorization (EUA). This EUA will remain  in effect (meaning this test can be used) for the duration of the COVID-19 declaration under Section 564(b)(1) of the Act, 21 U.S.C.section 360bbb-3(b)(1), unless the authorization is terminated  or revoked sooner.       Influenza A by PCR NEGATIVE NEGATIVE Final   Influenza B by PCR NEGATIVE NEGATIVE Final    Comment: (NOTE) The Xpert Xpress SARS-CoV-2/FLU/RSV plus assay is intended as an aid in the diagnosis of influenza from Nasopharyngeal swab specimens and should not be used as a sole basis for treatment. Nasal washings and aspirates are unacceptable for Xpert Xpress SARS-CoV-2/FLU/RSV testing.  Fact Sheet for Patients: BloggerCourse.com  Fact Sheet for Healthcare Providers: SeriousBroker.it  This test is not yet approved or cleared by the [place] FDA and has been authorized for detection and/or diagnosis of SARS-CoV-2 by FDA under an Emergency Use Authorization (EUA). This EUA will remain in effect (meaning this test can be used) for the  duration of the COVID-19 declaration under Section 564(b)(1) of the Act, 21 U.S.C. section 360bbb-3(b)(1), unless the authorization is terminated or revoked.  Performed at [facility], [address], [place], [place] [address]      Management plans discussed with the patient, family and they are in agreement.  CODE STATUS:     Code Status Orders  (From admission, onward)         Start     Ordered   01/07/20 1721  Full code  Continuous  01/07/20 1722        Code Status History    Date Active Date Inactive Code Status Order ID Comments User Context   03/06/2017 1958 03/09/2017 1852 Full Code [id number]  Bertrum SolSalary, Montell D, MD Inpatient   11/25/2016 0238 11/28/2016 2229 Full Code [id number]  Oralia ManisWillis, David, MD Inpatient   11/05/2016 0104 11/07/2016 2237 Full Code [id number]  Arnaldo Nataliamond, Michael S, MD ED   Advance Care Planning Activity    Advance Directive Documentation   Flowsheet Row Most Recent Value  Type of Advance Directive Living will  Pre-existing out of facility DNR order (yellow form or pink MOST form) --  "MOST" Form in Place? --      TOTAL TIME TAKING CARE OF THIS PATIENT: 32 minutes.    Alford Highlandichard Dianelly Ferran M.D on 01/21/2020 at 4:07 PM  Between 7am to 6pm - Pager - [phone]  After 6pm go to [email or web address] - password [email or web address]  [facility]  CC: Primary care physician; Dr. Clydie Braunavid Fitzgerald

## 2020-01-21 NOTE — Progress Notes (Signed)
This RN provided discharge instructions and teaching to the patient and family. All outstanding questions resolved. All belongings packed and in tow. [facility] transport at bedside to procure patient and discharge home.

## 2020-01-21 NOTE — TOC Progression Note (Addendum)
Transition of Care [facility]) - Progression Note    Patient Details  Name: [name] [name] MRN: [id number] Date of Birth: 1926-06-27  Transition of [facility]) CM/SW Contact  Trenton Founds, RN Phone Number: 01/21/2020, 8:13 AM  Clinical Narrative:   RNCM reached out to patient's grandson to discuss discharge planning. [name] is agreeable to home health being arranged and requests a hospital bed, wheelchair and bedside commode. He reports that his father can be at the home up until till 2 and they he gets home after 4. RNCM reached out to Elease Hashimoto with [employer] for equipment needs and [place] with [facility] and she accepted for home health.   9:15am: RNCM arranged EMS transport for 4:30pm, EMS paperwork completed. Elease Hashimoto with [employer] is trying to work on getting equipment arranged. This may or may not take place before patient discharges home.     Expected Discharge Plan: Skilled Nursing Facility Barriers to Discharge: No Barriers Identified  Expected Discharge Plan and Services Expected Discharge Plan: Skilled Nursing Facility     Post Acute Care Choice: Skilled Nursing Facility Living arrangements for the past 2 months: Single Family Home Expected Discharge Date: 01/21/20                                     Social Determinants of Health (SDOH) Interventions    Readmission Risk Interventions No flowsheet data found.

## 2020-04-06 ENCOUNTER — Inpatient Hospital Stay
Admission: EM | Admit: 2020-04-06 | Discharge: 2020-04-09 | DRG: 605 | Disposition: A | Discharge status: Home / Self Care | Payer: Medicare HMO | Attending: Hospitalist | Admitting: Hospitalist

## 2020-04-06 ENCOUNTER — Emergency Department: Payer: Medicare HMO

## 2020-04-06 DIAGNOSIS — I878 Other specified disorders of veins: Secondary | ICD-10-CM | POA: Diagnosis present

## 2020-04-06 DIAGNOSIS — Y92009 Unspecified place in unspecified non-institutional (private) residence as the place of occurrence of the external cause: Secondary | ICD-10-CM | POA: Diagnosis not present

## 2020-04-06 DIAGNOSIS — R778 Other specified abnormalities of plasma proteins: Secondary | ICD-10-CM | POA: Diagnosis not present

## 2020-04-06 DIAGNOSIS — I890 Lymphedema, not elsewhere classified: Secondary | ICD-10-CM | POA: Diagnosis not present

## 2020-04-06 DIAGNOSIS — W19XXXA Unspecified fall, initial encounter: Secondary | ICD-10-CM | POA: Diagnosis not present

## 2020-04-06 DIAGNOSIS — Z79899 Other long term (current) drug therapy: Secondary | ICD-10-CM

## 2020-04-06 DIAGNOSIS — I129 Hypertensive chronic kidney disease with stage 1 through stage 4 chronic kidney disease, or unspecified chronic kidney disease: Secondary | ICD-10-CM | POA: Diagnosis present

## 2020-04-06 DIAGNOSIS — I10 Essential (primary) hypertension: Secondary | ICD-10-CM | POA: Insufficient documentation

## 2020-04-06 DIAGNOSIS — Z833 Family history of diabetes mellitus: Secondary | ICD-10-CM | POA: Diagnosis not present

## 2020-04-06 DIAGNOSIS — I5032 Chronic diastolic (congestive) heart failure: Secondary | ICD-10-CM

## 2020-04-06 DIAGNOSIS — I5031 Acute diastolic (congestive) heart failure: Secondary | ICD-10-CM | POA: Diagnosis not present

## 2020-04-06 DIAGNOSIS — Z20822 Contact with and (suspected) exposure to covid-19: Secondary | ICD-10-CM | POA: Diagnosis present

## 2020-04-06 DIAGNOSIS — N1832 Chronic kidney disease, stage 3b: Secondary | ICD-10-CM | POA: Diagnosis present

## 2020-04-06 DIAGNOSIS — Y92013 Bedroom of single-family (private) house as the place of occurrence of the external cause: Secondary | ICD-10-CM

## 2020-04-06 DIAGNOSIS — I509 Heart failure, unspecified: Secondary | ICD-10-CM

## 2020-04-06 DIAGNOSIS — S40022A Contusion of left upper arm, initial encounter: Principal | ICD-10-CM | POA: Diagnosis present

## 2020-04-06 DIAGNOSIS — Z66 Do not resuscitate: Secondary | ICD-10-CM | POA: Diagnosis not present

## 2020-04-06 DIAGNOSIS — Z8249 Family history of ischemic heart disease and other diseases of the circulatory system: Secondary | ICD-10-CM

## 2020-04-06 DIAGNOSIS — Z7401 Bed confinement status: Secondary | ICD-10-CM | POA: Diagnosis not present

## 2020-04-06 DIAGNOSIS — W06XXXA Fall from bed, initial encounter: Secondary | ICD-10-CM | POA: Diagnosis present

## 2020-04-06 DIAGNOSIS — R410 Disorientation, unspecified: Secondary | ICD-10-CM

## 2020-04-06 DIAGNOSIS — R7989 Other specified abnormal findings of blood chemistry: Secondary | ICD-10-CM

## 2020-04-06 DIAGNOSIS — N3001 Acute cystitis with hematuria: Secondary | ICD-10-CM

## 2020-04-06 DIAGNOSIS — Z993 Dependence on wheelchair: Secondary | ICD-10-CM | POA: Diagnosis not present

## 2020-04-06 LAB — URINALYSIS, COMPLETE (UACMP) WITH MICROSCOPIC
Bilirubin Urine: NEGATIVE
Glucose, UA: NEGATIVE mg/dL
Ketones, ur: 5 mg/dL — AB
Nitrite: NEGATIVE
Protein, ur: 30 mg/dL — AB
Specific Gravity, Urine: 1.016 (ref 1.005–1.030)
pH: 5 (ref 5.0–8.0)

## 2020-04-06 LAB — BLOOD GAS, ARTERIAL
Acid-base deficit: 1.3 mmol/L (ref 0.0–2.0)
Bicarbonate: 22.7 mmol/L (ref 20.0–28.0)
FIO2: 0.21
O2 Saturation: 97.8 %
Patient temperature: 37
pCO2 arterial: 35 mmHg (ref 32.0–48.0)
pH, Arterial: 7.42 (ref 7.350–7.450)
pO2, Arterial: 99 mmHg (ref 83.0–108.0)

## 2020-04-06 LAB — CBC WITH DIFFERENTIAL/PLATELET
Abs Immature Granulocytes: 0.01 10*3/uL (ref 0.00–0.07)
Basophils Absolute: 0 10*3/uL (ref 0.0–0.1)
Basophils Relative: 1 %
Eosinophils Absolute: 0 10*3/uL (ref 0.0–0.5)
Eosinophils Relative: 1 %
HCT: 39.4 % (ref 36.0–46.0)
Hemoglobin: 12.4 g/dL (ref 12.0–15.0)
Immature Granulocytes: 0 %
Lymphocytes Relative: 15 %
Lymphs Abs: 0.8 10*3/uL (ref 0.7–4.0)
MCH: 29.8 pg (ref 26.0–34.0)
MCHC: 31.5 g/dL (ref 30.0–36.0)
MCV: 94.7 fL (ref 80.0–100.0)
Monocytes Absolute: 0.5 10*3/uL (ref 0.1–1.0)
Monocytes Relative: 9 %
Neutro Abs: 3.9 10*3/uL (ref 1.7–7.7)
Neutrophils Relative %: 74 %
Platelets: 267 10*3/uL (ref 150–400)
RBC: 4.16 MIL/uL (ref 3.87–5.11)
RDW: 12.8 % (ref 11.5–15.5)
WBC: 5.2 10*3/uL (ref 4.0–10.5)
nRBC: 0 % (ref 0.0–0.2)

## 2020-04-06 LAB — TROPONIN I (HIGH SENSITIVITY)
Troponin I (High Sensitivity): 79 ng/L — ABNORMAL HIGH (ref <18)
Troponin I (High Sensitivity): 63 ng/L — ABNORMAL HIGH (ref <18)

## 2020-04-06 LAB — CBC
HCT: 35.7 % — ABNORMAL LOW (ref 36.0–46.0)
Hemoglobin: 11.3 g/dL — ABNORMAL LOW (ref 12.0–15.0)
MCH: 30.3 pg (ref 26.0–34.0)
MCHC: 31.7 g/dL (ref 30.0–36.0)
MCV: 95.7 fL (ref 80.0–100.0)
Platelets: 231 10*3/uL (ref 150–400)
RBC: 3.73 MIL/uL — ABNORMAL LOW (ref 3.87–5.11)
RDW: 12.9 % (ref 11.5–15.5)
WBC: 5.5 10*3/uL (ref 4.0–10.5)
nRBC: 0 % (ref 0.0–0.2)

## 2020-04-06 LAB — COMPREHENSIVE METABOLIC PANEL
ALT: 20 U/L (ref 0–44)
AST: 40 U/L (ref 15–41)
Albumin: 3.5 g/dL (ref 3.5–5.0)
Alkaline Phosphatase: 66 U/L (ref 38–126)
Anion gap: 7 (ref 5–15)
BUN: 26 mg/dL — ABNORMAL HIGH (ref 8–23)
CO2: 24 mmol/L (ref 22–32)
Calcium: 9.2 mg/dL (ref 8.9–10.3)
Chloride: 111 mmol/L (ref 98–111)
Creatinine, Ser: 0.96 mg/dL (ref 0.44–1.00)
GFR, Estimated: 56 mL/min — ABNORMAL LOW (ref >60)
Glucose, Bld: 118 mg/dL — ABNORMAL HIGH (ref 70–99)
Potassium: 3.8 mmol/L (ref 3.5–5.1)
Sodium: 142 mmol/L (ref 135–145)
Total Bilirubin: 0.7 mg/dL (ref 0.3–1.2)
Total Protein: 7.4 g/dL (ref 6.5–8.1)

## 2020-04-06 LAB — RESP PANEL BY RT-PCR (FLU A&B, COVID) ARPGX2
Influenza A by PCR: NEGATIVE
Influenza B by PCR: NEGATIVE
SARS Coronavirus 2 by RT PCR: NEGATIVE

## 2020-04-06 LAB — BRAIN NATRIURETIC PEPTIDE: B Natriuretic Peptide: 1037.5 pg/mL — ABNORMAL HIGH (ref 0.0–100.0)

## 2020-04-06 LAB — MAGNESIUM: Magnesium: 2.2 mg/dL (ref 1.7–2.4)

## 2020-04-06 LAB — CREATININE, SERUM
Creatinine, Ser: 0.92 mg/dL (ref 0.44–1.00)
GFR, Estimated: 58 mL/min — ABNORMAL LOW (ref >60)

## 2020-04-06 LAB — TSH: TSH: 3.388 u[IU]/mL (ref 0.350–4.500)

## 2020-04-06 MED ORDER — FUROSEMIDE 10 MG/ML IJ SOLN
40.0000 mg | Freq: Once | INTRAMUSCULAR | Status: AC
  Administered 2020-04-06: 40 mg via INTRAVENOUS
  Filled 2020-04-06: qty 4

## 2020-04-06 MED ORDER — ACETAMINOPHEN 500 MG PO TABS
1000.0000 mg | ORAL_TABLET | Freq: Once | ORAL | Status: AC
  Administered 2020-04-06: 1000 mg via ORAL
  Filled 2020-04-06: qty 2

## 2020-04-06 MED ORDER — ACETAMINOPHEN 325 MG PO TABS: 650.0000 mg | ORAL_TABLET | Freq: Four times a day (QID) | ORAL | Status: DC | PRN

## 2020-04-06 MED ORDER — ONDANSETRON HCL 4 MG PO TABS: 4.0000 mg | ORAL_TABLET | Freq: Four times a day (QID) | ORAL | Status: DC | PRN

## 2020-04-06 MED ORDER — METOPROLOL SUCCINATE ER 50 MG PO TB24
25.0000 mg | ORAL_TABLET | Freq: Every day | ORAL | Status: DC
  Administered 2020-04-06: 25 mg via ORAL
  Filled 2020-04-06 (×2): qty 1

## 2020-04-06 MED ORDER — SENNOSIDES-DOCUSATE SODIUM 8.6-50 MG PO TABS: 1.0000 | ORAL_TABLET | Freq: Every evening | ORAL | Status: DC | PRN

## 2020-04-06 MED ORDER — LISINOPRIL 5 MG PO TABS
2.5000 mg | ORAL_TABLET | Freq: Every day | ORAL | Status: DC
  Administered 2020-04-06 – 2020-04-08 (×3): 2.5 mg via ORAL
  Filled 2020-04-06 (×4): qty 1

## 2020-04-06 MED ORDER — SODIUM CHLORIDE 0.9 % IV SOLN
1.0000 g | Freq: Once | INTRAVENOUS | Status: AC
  Administered 2020-04-06: 1 g via INTRAVENOUS
  Filled 2020-04-06: qty 10

## 2020-04-06 MED ORDER — ASPIRIN EC 81 MG PO TBEC
81.0000 mg | DELAYED_RELEASE_TABLET | Freq: Every day | ORAL | Status: DC
  Administered 2020-04-07 – 2020-04-08 (×2): 81 mg via ORAL
  Filled 2020-04-06 (×2): qty 1

## 2020-04-06 MED ORDER — ACETAMINOPHEN 650 MG RE SUPP: 650.0000 mg | Freq: Four times a day (QID) | RECTAL | Status: DC | PRN

## 2020-04-06 MED ORDER — ASPIRIN 81 MG PO CHEW
324.0000 mg | CHEWABLE_TABLET | Freq: Once | ORAL | Status: AC
  Administered 2020-04-06: 324 mg via ORAL
  Filled 2020-04-06: qty 4

## 2020-04-06 MED ORDER — ENOXAPARIN SODIUM 40 MG/0.4ML ~~LOC~~ SOLN
40.0000 mg | SUBCUTANEOUS | Status: DC
  Administered 2020-04-06 – 2020-04-08 (×3): 40 mg via SUBCUTANEOUS
  Filled 2020-04-06 (×3): qty 0.4

## 2020-04-06 MED ORDER — FUROSEMIDE 10 MG/ML IJ SOLN
20.0000 mg | Freq: Two times a day (BID) | INTRAMUSCULAR | Status: DC
  Administered 2020-04-06 – 2020-04-07 (×2): 20 mg via INTRAVENOUS
  Filled 2020-04-06 (×2): qty 4

## 2020-04-06 MED ORDER — ONDANSETRON HCL 4 MG/2ML IJ SOLN: 4.0000 mg | Freq: Four times a day (QID) | INTRAMUSCULAR | Status: DC | PRN

## 2020-04-06 NOTE — Progress Notes (Signed)
D/w son Kiyla Ringler on the phone. Code status addressed pt is DNR/DNI [name] understands it

## 2020-04-06 NOTE — ED Notes (Signed)
Patients son, Fredia Sorrow [phone]) now at bedside. MD notified.

## 2020-04-06 NOTE — TOC Initial Note (Signed)
Transition of Care [facility]) - Initial/Assessment Note    Patient Details  Name: [name] [name] MRN: [id number] Date of Birth: December 15, 1926  [facility] CM/SW Contact:    Marina Goodell Phone Number: [phone] 04/06/2020, 3:25 PM  Clinical Narrative:                  CSW called [facility] to make negligence report.  Left message requesting a return call.       Patient Goals and CMS Choice        Expected Discharge Plan and Services                                                Prior Living Arrangements/Services                       Activities of Daily Living      Permission Sought/Granted                  Emotional Assessment              Admission diagnosis:  fall ems  Patient Active Problem List   Diagnosis Date Noted  . Stage 3b chronic kidney disease (HCC)   . B12 deficiency   . Anemia due to vitamin B12 deficiency   . Acute metabolic encephalopathy   . General weakness   . Confusion   . Acute lower UTI 01/07/2020  . E. coli UTI 01/07/2020  . Chronic venous stasis dermatitis of both lower extremities 01/07/2020  . Cellulitis 03/06/2017  . Left leg cellulitis 11/25/2016  . AKI (acute kidney injury) (HCC) 11/05/2016  . Decubitus ulcer of ischium, stage 2, left (HCC) 11/05/2016  . Elevated troponin 11/04/2016   PCP:  Leotis Shames, MD Pharmacy:   [facility] [address] ([address]), [place] - [address] [place]) [place] [address] Phone: [phone] Fax: [phone]     Social Determinants of Health (SDOH) Interventions    Readmission Risk Interventions No flowsheet data found.

## 2020-04-06 NOTE — ED Triage Notes (Signed)
Patient presents to ER from home with EMS. Patient fell out of bed this morning, when EMS arrived to home patient noted be covered in urine/feces. Family refused to send to ER at that time. Patient was assisted back to bed. EMS was again called to home this afternoon for patient complaining of left shoulder pain. Patient alert and oriented. Patient does not ambulate at baseline except to transfer from bed to wheelchair. Patient noted to have chronic swelling and redness to bilateral lower extremities. Bruising noted to bilateral arms. Patient noted to have decreased circulation to bilateral hands, finger tips blue and cold.

## 2020-04-06 NOTE — ED Notes (Signed)
Admitting provider Dr. Allena Katz at bedside
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
## 2020-04-06 NOTE — ED Notes (Signed)
Patient transported to CT 

## 2020-04-06 NOTE — H&P (Signed)
[facility] [place] [facility]   PATIENT NAME: [name] PatrickDorothy [name]    MR#:  [id number]  DATE OF BIRTH:  05-13-1926  DATE OF ADMISSION:  04/06/2020  PRIMARY CARE PHYSICIAN: Mick SellFitzgerald, David P, MD   REQUESTING/REFERRING PHYSICIAN: Dr. Katrinka BlazingSmith  Patient coming from : home. Cared by grandson, son   CHIEF COMPLAINT:  fall at home  HISTORY OF PRESENT ILLNESS:  [name] PatrickDorothy [name]  is a 87 y.o. female with a known history of lower extremity lymphedema chronic, history of hypertension not on any meds, chronic venous stasis changes, history of cellulitis lower extremity, grade 2 diastolic dysfunction comes to the emergency room from home after EMS was called since patient slid off from her bed this morning. Patient has few bruises on her upper extremity. She is majority of the time bedbound. However per family she is able to transfer to wheelchair every now and then.  Patient is a poor historian.  ED course: hemodynamically stable. No shortness of breath or chest pain. She is afebrile blood pressure 148/74, 179/76. Sats hundred percent on room air. CT head negative. CT cervical spine negative for trauma. Positive for DJD. Various radiological x-rays negative for trauma. UA positive for many bacteria and WBC. Received dose of IV Rocephin. Patient denies any symptoms of dysuria BNP was more than thousand. Chest x-ray no acute cardiopulmonary abnormality patient received a dose of Lasix. Hospitalist was consulted for acute CHF and fall.  From 2021 shows EF of 55 to 60% PAST MEDICAL HISTORY:   Past Medical History:  Diagnosis Date  . Cellulitis 10/28/2016   bilateral lower extremities.     PAST SURGICAL HISTOIRY:   Past Surgical History:  Procedure Laterality Date  . ABDOMINAL HYSTERECTOMY    . GANGLION CYST EXCISION      SOCIAL HISTORY:   Social History   Tobacco Use  . Smoking status: Never Smoker  . Smokeless tobacco: Never Used  Substance Use Topics  .  Alcohol use: No    FAMILY HISTORY:   Family History  Problem Relation Age of Onset  . Diabetes Mellitus II Mother   . Heart disease Father     DRUG ALLERGIES:  No Known Allergies  REVIEW OF SYSTEMS:  Review of Systems  Constitutional: Negative for chills, fever and weight loss.  HENT: Negative for ear discharge, ear pain and nosebleeds.   Eyes: Negative for blurred vision, pain and discharge.  Respiratory: Negative for sputum production, wheezing and stridor.   Cardiovascular: Positive for leg swelling. Negative for chest pain, palpitations, orthopnea and PND.  Gastrointestinal: Negative for abdominal pain, diarrhea, nausea and vomiting.  Genitourinary: Negative for frequency and urgency.  Musculoskeletal: Negative for back pain and joint pain.  Neurological: Positive for weakness. Negative for sensory change, speech change and focal weakness.  Psychiatric/Behavioral: Negative for depression and hallucinations. The patient is not nervous/anxious.      MEDICATIONS AT HOME:   Prior to Admission medications   Medication Sig Start Date End Date Taking? Authorizing Provider  acetaminophen (TYLENOL) 325 MG tablet Take 2 tablets (650 mg total) every 6 (six) hours as needed by mouth for mild pain (or Fever >/= 101). 11/28/16   Ramonita LabGouru, Aruna, MD  traZODone (DESYREL) 50 MG tablet Take 0.5 tablets (25 mg total) by mouth at bedtime as needed for sleep. 01/20/20   Alford HighlandWieting, Richard, MD  vitamin B-12 1000 MCG tablet Take 1 tablet (1,000 mcg total) by mouth daily. 01/21/20   Alford HighlandWieting, Richard, MD  VITAL SIGNS:  Blood pressure (!) 179/76, pulse 73, temperature 98 F (36.7 C), temperature source Oral, resp. rate 14, height 5\' 7"  (1.702 m), weight 88 kg, SpO2 100 %.  PHYSICAL EXAMINATION:  GENERAL:  87 y.o.-year-old patient lying in the bed with no acute distress.  LUNGS: Normal breath sounds bilaterally, no wheezing, rales,rhonchi or crepitation. No use of accessory muscles of respiration.   CARDIOVASCULAR: S1, S2 normal. No murmurs, rubs, or gallops.  ABDOMEN: Soft, nontender, nondistended. Bowel sounds present. No organomegaly or mass.  EXTREMITIES:   NEUROLOGIC: Cranial nerves II through XII are intact. Muscle strength 5/5 in all extremities. Sensation intact. Gait not checked.  PSYCHIATRIC: The patient is alert and oriented x 1.  SKIN:per Rn Pt does have a pressure sore per ER MD POA  LABORATORY PANEL:   CBC Recent Labs  Lab 04/06/20 1407  WBC 5.2  HGB 12.4  HCT 39.4  PLT 267   ------------------------------------------------------------------------------------------------------------------  Chemistries  Recent Labs  Lab 04/06/20 1407  NA 142  K 3.8  CL 111  CO2 24  GLUCOSE 118*  BUN 26*  CREATININE 0.96  CALCIUM 9.2  MG 2.2  AST 40  ALT 20  ALKPHOS 66  BILITOT 0.7   ------------------------------------------------------------------------------------------------------------------  Cardiac Enzymes No results for input(s): TROPONINI in the last 168 hours. ------------------------------------------------------------------------------------------------------------------  RADIOLOGY:  DG Chest 2 View  Result Date: 04/06/2020 CLINICAL DATA:  Fall EXAM: CHEST - 2 VIEW COMPARISON:  11/24/2016 FINDINGS: No focal opacity or pleural effusion. Stable cardiomediastinal silhouette with borderline cardiac size. Aortic atherosclerosis. No pneumothorax. No definite acute osseous abnormality. IMPRESSION: No active cardiopulmonary disease. Electronically Signed   By: 11/26/2016 M.D.   On: 04/06/2020 15:16   DG Pelvis 1-2 Views  Result Date: 04/06/2020 CLINICAL DATA:  Fall EXAM: PELVIS - 1-2 VIEW COMPARISON:  None. FINDINGS: SI joints are non widened. Pubic symphysis and rami appear intact. Both femoral heads project in joint. No fracture or malalignment is seen. IMPRESSION: Negative. Electronically Signed   By: 04/08/2020 M.D.   On: 04/06/2020 15:21   DG  Elbow Complete Left  Result Date: 04/06/2020 CLINICAL DATA:  04/08/2020 out of bed EXAM: LEFT ELBOW - COMPLETE 3+ VIEW COMPARISON:  None. FINDINGS: No fracture or malalignment. No definitive elbow effusion. Spurring at the radial head neck junction and coronoid process of the ulna. IMPRESSION: No definite acute osseous abnormality. Electronically Signed   By: Larey Seat M.D.   On: 04/06/2020 15:19   DG Wrist Complete Left  Result Date: 04/06/2020 CLINICAL DATA:  Fall with wrist pain EXAM: LEFT WRIST - COMPLETE 3+ VIEW COMPARISON:  None. FINDINGS: No fracture or malalignment. Degenerative changes at the first [facility] joint and radiocarpal joint. Soft tissues are unremarkable. IMPRESSION: No acute osseous abnormality. Electronically Signed   By: [facility] M.D.   On: 04/06/2020 15:24   CT Head Wo Contrast  Result Date: 04/06/2020 CLINICAL DATA:  Trauma.  Fall. EXAM: CT HEAD WITHOUT CONTRAST CT CERVICAL SPINE WITHOUT CONTRAST TECHNIQUE: Multidetector CT imaging of the head and cervical spine was performed following the standard protocol without intravenous contrast. Multiplanar CT image reconstructions of the cervical spine were also generated. COMPARISON:  CT head November 04, 2016. FINDINGS: CT HEAD FINDINGS Brain: No evidence of acute large vascular territory infarction, hemorrhage, hydrocephalus, extra-axial collection or mass lesion/mass effect. Similar generalized cerebral volume loss with ex vacuo ventricular dilation. Similar white matter hypoattenuation, which is nonspecific but most likely related to chronic microvascular ischemic disease given  patient age. Vascular: No hyperdense vessel identified. Calcific atherosclerosis. Skull: No acute fracture. Sinuses/Orbits: Mild ethmoid mucosal thickening. No air-fluid levels. Other: No mastoid effusions. CT CERVICAL SPINE FINDINGS Alignment: Reversal of the normal cervical lordosis. Approximately 2-3 mm of anterolisthesis of C3 on C4, favored degenerative given  severe facet arthropathy at this level. Skull base and vertebrae: No evidence of acute fracture. Vertebral body heights are maintained. Soft tissues and spinal canal: No prevertebral fluid or swelling. No visible canal hematoma. Disc levels: Craniocervical degenerative change narrowing of the median atlantoaxial joint space. Severe facet arthropathy in the upper cervical spine, greatest at C3-C4. Fusion across the right C2-C3 facet joint. Multilevel degenerative disc disease, which is moderate at C4-C5 C5-C6 and C6-C7 with disc space height loss, endplate sclerosis and posterior disc osteophyte complexes. No evidence of advanced bony canal stenosis. Upper chest: Visualized lung apices are clear. IMPRESSION: CT head: 1. No evidence of acute intracranial abnormality. 2. Similar cerebral atrophy and chronic microvascular ischemic disease. CT cervical spine: 1. No evidence of acute fracture. 2. Approximately 2-3 mm of anterolisthesis of C3 on C4, favored degenerative given severe facet arthropathy at this level. 3. Multilevel degenerative change, as detailed above. Electronically Signed   By: Feliberto Harts MD   On: 04/06/2020 14:48   CT Cervical Spine Wo Contrast  Result Date: 04/06/2020 CLINICAL DATA:  Trauma.  Fall. EXAM: CT HEAD WITHOUT CONTRAST CT CERVICAL SPINE WITHOUT CONTRAST TECHNIQUE: Multidetector CT imaging of the head and cervical spine was performed following the standard protocol without intravenous contrast. Multiplanar CT image reconstructions of the cervical spine were also generated. COMPARISON:  CT head November 04, 2016. FINDINGS: CT HEAD FINDINGS Brain: No evidence of acute large vascular territory infarction, hemorrhage, hydrocephalus, extra-axial collection or mass lesion/mass effect. Similar generalized cerebral volume loss with ex vacuo ventricular dilation. Similar white matter hypoattenuation, which is nonspecific but most likely related to chronic microvascular ischemic disease given  patient age. Vascular: No hyperdense vessel identified. Calcific atherosclerosis. Skull: No acute fracture. Sinuses/Orbits: Mild ethmoid mucosal thickening. No air-fluid levels. Other: No mastoid effusions. CT CERVICAL SPINE FINDINGS Alignment: Reversal of the normal cervical lordosis. Approximately 2-3 mm of anterolisthesis of C3 on C4, favored degenerative given severe facet arthropathy at this level. Skull base and vertebrae: No evidence of acute fracture. Vertebral body heights are maintained. Soft tissues and spinal canal: No prevertebral fluid or swelling. No visible canal hematoma. Disc levels: Craniocervical degenerative change narrowing of the median atlantoaxial joint space. Severe facet arthropathy in the upper cervical spine, greatest at C3-C4. Fusion across the right C2-C3 facet joint. Multilevel degenerative disc disease, which is moderate at C4-C5 C5-C6 and C6-C7 with disc space height loss, endplate sclerosis and posterior disc osteophyte complexes. No evidence of advanced bony canal stenosis. Upper chest: Visualized lung apices are clear. IMPRESSION: CT head: 1. No evidence of acute intracranial abnormality. 2. Similar cerebral atrophy and chronic microvascular ischemic disease. CT cervical spine: 1. No evidence of acute fracture. 2. Approximately 2-3 mm of anterolisthesis of C3 on C4, favored degenerative given severe facet arthropathy at this level. 3. Multilevel degenerative change, as detailed above. Electronically Signed   By: Feliberto Harts MD   On: 04/06/2020 14:48   DG Shoulder Left  Result Date: 04/06/2020 CLINICAL DATA:  Fall with shoulder pain EXAM: LEFT SHOULDER - 2+ VIEW COMPARISON:  None. FINDINGS: No fracture or malalignment. AC joint and glenohumeral degenerative change. IMPRESSION: No acute osseous abnormality. Electronically Signed   By: Adrian Prows.D.  On: 04/06/2020 15:23    EKG:    IMPRESSION AND PLAN:   Shelsea Hangartner  is a 87 y.o. female with a known  history of lower extremity lymphedema chronic, history of hypertension not on any meds, chronic venous stasis changes, history of cellulitis lower extremity, grade 2 diastolic dysfunction comes to the emergency room from home after EMS was called since patient slid off from her bed this morning.  Acute congestive heart failure suspect diastolic with history of great toe diastolic dysfunction per echo 1157 Hypertensive heart disease/uncontrolled hypertension -- admit patient to cardiac floor -- IV Lasix 20 mg BID -- Toprol-XL 25 daily and lisinopril 2.5 mg daily -- monitor input output -- patient has known history of hypertension long-term however she does not like to take meds according to PCP notes.  Uncontrolled hypertension -- treatment as above  Bilateral lower extremity venous stasis and chronic lymphedema -- no evidence of cellulitis -- continue to monitor  Abnormal UA -- patient denies any dysuria fever -- white count normal -- received IV Rocephin in the ER. Will continue to monitor urine culture and treat if needed  Generalized deconditioning with fall -- per grandson patient most of the time is bedbound occasionally transfers to wheelchair by herself. -- TOC for discharge planning--APS report has been made -- palliative care for goals of care   Family Communication : [name] grandson on the phone Consults : palliative care Code Status : full code -- per grandson [name]. Will try discussed with patient's son [name] DVT prophylaxis : Lovenox Level of care: Progressive Cardiac  TOTAL TIME TAKING CARE OF THIS PATIENT: **45* minutes.    Enedina Finner M.D  [facility]     CC: Primary care physician; Mick Sell, MD

## 2020-04-07 DIAGNOSIS — S40022A Contusion of left upper arm, initial encounter: Secondary | ICD-10-CM | POA: Insufficient documentation

## 2020-04-07 DIAGNOSIS — R778 Other specified abnormalities of plasma proteins: Secondary | ICD-10-CM | POA: Diagnosis not present

## 2020-04-07 DIAGNOSIS — I5031 Acute diastolic (congestive) heart failure: Secondary | ICD-10-CM | POA: Diagnosis not present

## 2020-04-07 DIAGNOSIS — W19XXXA Unspecified fall, initial encounter: Secondary | ICD-10-CM | POA: Diagnosis not present

## 2020-04-07 DIAGNOSIS — R410 Disorientation, unspecified: Secondary | ICD-10-CM

## 2020-04-07 LAB — BASIC METABOLIC PANEL
Anion gap: 7 (ref 5–15)
BUN: 33 mg/dL — ABNORMAL HIGH (ref 8–23)
CO2: 24 mmol/L (ref 22–32)
Calcium: 8.8 mg/dL — ABNORMAL LOW (ref 8.9–10.3)
Chloride: 112 mmol/L — ABNORMAL HIGH (ref 98–111)
Creatinine, Ser: 1.21 mg/dL — ABNORMAL HIGH (ref 0.44–1.00)
GFR, Estimated: 42 mL/min — ABNORMAL LOW (ref >60)
Glucose, Bld: 90 mg/dL (ref 70–99)
Potassium: 3.8 mmol/L (ref 3.5–5.1)
Sodium: 143 mmol/L (ref 135–145)

## 2020-04-07 MED ORDER — FUROSEMIDE 10 MG/ML IJ SOLN: 20.0000 mg | Freq: Every day | INTRAMUSCULAR | Status: DC

## 2020-04-07 NOTE — TOC Initial Note (Addendum)
Transition of Care [facility]) - Initial/Assessment Note    Patient Details  Name: [name] [name] MRN: [id number] Date of Birth: 14-Jun-1926  Transition of Care [facility]) CM/SW Contact:    Marina Goodell Phone Number: [phone] 04/07/2020, 1:31 PM  Clinical Narrative:                  Patient presents to ED due to pain in left shoulder.  Patient initially fell, EMS was called but family refused transport to ED.  EMS noted the patient was dirty with feces and urine. EMS was called back to the home later in the day, due to the patient complaining of shoulder pain. EMS noted bruising on patient arms, swelling on legs with cold fingers and toes. CSW spoke with patient's [place], [facility]) [phone] requesting collateral information.  CSW explained the role of TOC in patient care.  Ms Noack stated the patient has round the clock care. Mr. Molzahn stated the patient's son, daughter-in-law, grandson and grandson's girlfriend all assist with care taking.  Mr. Mateja stated the patient also has private aid assistance. Mr. Fishman stated the patient is unable to ambulate on her own and uses a wheelchair to get around. Patient is unable to perform ADLs without assistance, and Mr. Sens assured this CSW the patient has extensive assistance at home. Patient's PCP is at [facility] clinic but Mr. Koerner was unable to remember his name.  Ms. Savin stated the patient saw her PCP two weeks ago. Mr. Groll stated the patient the patient's son, Dhrithi Riche is trying to get the patient active with the Medicaid CAP program to receive more assistance for her care.  CSW called and left voicemail for APS to make report, requested a call back.  Expected Discharge Plan: Home w Home Health Services Barriers to Discharge: Continued Medical Work up   Patient Goals and CMS Choice Patient states their goals for this hospitalization and ongoing recovery are:: To go back home.      Expected Discharge  Plan and Services Expected Discharge Plan: Home w Home Health Services In-house Referral: Clinical Social Work   Post Acute Care Choice: Home Health Living arrangements for the past 2 months: Single Family Home                                      Prior Living Arrangements/Services Living arrangements for the past 2 months: Single Family Home Lives with:: Other (Comment) Maisha, [place])   [phone]) Patient language and need for interpreter reviewed:: Yes Do you feel safe going back to the place where you live?: Yes      Need for Family Participation in Patient Care: Yes (Comment) Care giver support system in place?: Yes (comment) Current home services: Homehealth aide Criminal Activity/Legal Involvement Pertinent to Current Situation/Hospitalization: No - Comment as needed  Activities of Daily Living      Permission Sought/Granted Permission sought to share information with : Facility Medical sales representative Permission granted to share information with : Yes, Verbal Permission Granted  Share Information with NAME: Myrissa, [place] [facility])   [phone]           Emotional Assessment Appearance:: Appears stated age Attitude/Demeanor/Rapport: Unable to Assess Affect (typically observed): Unable to Assess Orientation: : Oriented to Self,Oriented to Place,Oriented to Situation Alcohol / Substance Use: Not Applicable Psych Involvement: No (comment)  Admission diagnosis:  Acute CHF (congestive heart failure) (HCC) [I50.9] Patient  Active Problem List   Diagnosis Date Noted  . Acute CHF (congestive heart failure) (HCC) 04/06/2020  . HTN (hypertension), malignant   . Lymphedema   . Fall at home, initial encounter   . Stage 3b chronic kidney disease (HCC)   . B12 deficiency   . Anemia due to vitamin B12 deficiency   . Acute metabolic encephalopathy   . General weakness   . Confusion   . Acute lower UTI 01/07/2020  . E. coli UTI 01/07/2020  .  Chronic venous stasis dermatitis of both lower extremities 01/07/2020  . Cellulitis 03/06/2017  . Left leg cellulitis 11/25/2016  . AKI (acute kidney injury) (HCC) 11/05/2016  . Decubitus ulcer of ischium, stage 2, left (HCC) 11/05/2016  . Elevated troponin 11/04/2016   PCP:  Mick Sell, MD Pharmacy:   [facility] [address] ([address]), [place] - [address] [place]) [place] [address] Phone: [phone] Fax: [phone]     Social Determinants of Health (SDOH) Interventions    Readmission Risk Interventions No flowsheet data found.

## 2020-04-07 NOTE — Progress Notes (Signed)
[facility]  - [place] [facility]   PATIENT NAME: [name] [name]    MR#:  [id number]  DATE OF BIRTH:  01-May-1926  SUBJECTIVE:  Resting quietly BP earlier a bit low 100/51 During my evaluation 139/70 Got IV lasix in am No respiratory distress  REVIEW OF SYSTEMS:   ROS Tolerating Diet: Tolerating PT:   DRUG ALLERGIES:  No Known Allergies  VITALS:  Blood pressure 129/71, pulse (!) 48, temperature 98 F (36.7 C), temperature source Oral, resp. rate 11, height 5\' 7"  (1.702 m), weight 88 kg, SpO2 95 %.  PHYSICAL EXAMINATION:   Physical Exam  GENERAL:  87 y.o.-year-old patient lying in the bed with no acute distress.  LUNGS: Normal breath sounds bilaterally, no wheezing, rales, rhonchi. No use of accessory muscles of respiration.  CARDIOVASCULAR: S1, S2 normal. No murmurs, rubs, or gallops.  ABDOMEN: Soft, nontender, nondistended. Bowel sounds present. No organomegaly or mass.  EXTREMITIES:    NEUROLOGIC: non focal PSYCHIATRIC:  patient is alert and oriented x 3.  SKIN: No obvious rash, lesion, or ulcer.   LABORATORY PANEL:  CBC Recent Labs  Lab 04/06/20 1716  WBC 5.5  HGB 11.3*  HCT 35.7*  PLT 231    Chemistries  Recent Labs  Lab 04/06/20 1407 04/06/20 1716 04/07/20 0513  NA 142  --  143  K 3.8  --  3.8  CL 111  --  112*  CO2 24  --  24  GLUCOSE 118*  --  90  BUN 26*  --  33*  CREATININE 0.96   < > 1.21*  CALCIUM 9.2  --  8.8*  MG 2.2  --   --   AST 40  --   --   ALT 20  --   --   ALKPHOS 66  --   --   BILITOT 0.7  --   --    < > = values in this interval not displayed.   Cardiac Enzymes No results for input(s): TROPONINI in the last 168 hours. RADIOLOGY:  DG Chest 2 View  Result Date: 04/06/2020 CLINICAL DATA:  Fall EXAM: CHEST - 2 VIEW COMPARISON:  11/24/2016 FINDINGS: No focal opacity or pleural effusion. Stable cardiomediastinal silhouette with borderline cardiac size. Aortic atherosclerosis. No pneumothorax. No definite  acute osseous abnormality. IMPRESSION: No active cardiopulmonary disease. Electronically Signed   By: 11/26/2016 M.D.   On: 04/06/2020 15:16   DG Pelvis 1-2 Views  Result Date: 04/06/2020 CLINICAL DATA:  Fall EXAM: PELVIS - 1-2 VIEW COMPARISON:  None. FINDINGS: SI joints are non widened. Pubic symphysis and rami appear intact. Both femoral heads project in joint. No fracture or malalignment is seen. IMPRESSION: Negative. Electronically Signed   By: 04/08/2020 M.D.   On: 04/06/2020 15:21   DG Elbow Complete Left  Result Date: 04/06/2020 CLINICAL DATA:  04/08/2020 out of bed EXAM: LEFT ELBOW - COMPLETE 3+ VIEW COMPARISON:  None. FINDINGS: No fracture or malalignment. No definitive elbow effusion. Spurring at the radial head neck junction and coronoid process of the ulna. IMPRESSION: No definite acute osseous abnormality. Electronically Signed   By: Larey Seat M.D.   On: 04/06/2020 15:19   DG Wrist Complete Left  Result Date: 04/06/2020 CLINICAL DATA:  Fall with wrist pain EXAM: LEFT WRIST - COMPLETE 3+ VIEW COMPARISON:  None. FINDINGS: No fracture or malalignment. Degenerative changes at the first [facility] joint and radiocarpal joint. Soft tissues are unremarkable. IMPRESSION: No acute osseous abnormality. Electronically Signed  By: Jasmine Pang M.D.   On: 04/06/2020 15:24   CT Head Wo Contrast  Result Date: 04/06/2020 CLINICAL DATA:  Trauma.  Fall. EXAM: CT HEAD WITHOUT CONTRAST CT CERVICAL SPINE WITHOUT CONTRAST TECHNIQUE: Multidetector CT imaging of the head and cervical spine was performed following the standard protocol without intravenous contrast. Multiplanar CT image reconstructions of the cervical spine were also generated. COMPARISON:  CT head November 04, 2016. FINDINGS: CT HEAD FINDINGS Brain: No evidence of acute large vascular territory infarction, hemorrhage, hydrocephalus, extra-axial collection or mass lesion/mass effect. Similar generalized cerebral volume loss with ex vacuo  ventricular dilation. Similar white matter hypoattenuation, which is nonspecific but most likely related to chronic microvascular ischemic disease given patient age. Vascular: No hyperdense vessel identified. Calcific atherosclerosis. Skull: No acute fracture. Sinuses/Orbits: Mild ethmoid mucosal thickening. No air-fluid levels. Other: No mastoid effusions. CT CERVICAL SPINE FINDINGS Alignment: Reversal of the normal cervical lordosis. Approximately 2-3 mm of anterolisthesis of C3 on C4, favored degenerative given severe facet arthropathy at this level. Skull base and vertebrae: No evidence of acute fracture. Vertebral body heights are maintained. Soft tissues and spinal canal: No prevertebral fluid or swelling. No visible canal hematoma. Disc levels: Craniocervical degenerative change narrowing of the median atlantoaxial joint space. Severe facet arthropathy in the upper cervical spine, greatest at C3-C4. Fusion across the right C2-C3 facet joint. Multilevel degenerative disc disease, which is moderate at C4-C5 C5-C6 and C6-C7 with disc space height loss, endplate sclerosis and posterior disc osteophyte complexes. No evidence of advanced bony canal stenosis. Upper chest: Visualized lung apices are clear. IMPRESSION: CT head: 1. No evidence of acute intracranial abnormality. 2. Similar cerebral atrophy and chronic microvascular ischemic disease. CT cervical spine: 1. No evidence of acute fracture. 2. Approximately 2-3 mm of anterolisthesis of C3 on C4, favored degenerative given severe facet arthropathy at this level. 3. Multilevel degenerative change, as detailed above. Electronically Signed   By: Feliberto Harts MD   On: 04/06/2020 14:48   CT Cervical Spine Wo Contrast  Result Date: 04/06/2020 CLINICAL DATA:  Trauma.  Fall. EXAM: CT HEAD WITHOUT CONTRAST CT CERVICAL SPINE WITHOUT CONTRAST TECHNIQUE: Multidetector CT imaging of the head and cervical spine was performed following the standard protocol without  intravenous contrast. Multiplanar CT image reconstructions of the cervical spine were also generated. COMPARISON:  CT head November 04, 2016. FINDINGS: CT HEAD FINDINGS Brain: No evidence of acute large vascular territory infarction, hemorrhage, hydrocephalus, extra-axial collection or mass lesion/mass effect. Similar generalized cerebral volume loss with ex vacuo ventricular dilation. Similar white matter hypoattenuation, which is nonspecific but most likely related to chronic microvascular ischemic disease given patient age. Vascular: No hyperdense vessel identified. Calcific atherosclerosis. Skull: No acute fracture. Sinuses/Orbits: Mild ethmoid mucosal thickening. No air-fluid levels. Other: No mastoid effusions. CT CERVICAL SPINE FINDINGS Alignment: Reversal of the normal cervical lordosis. Approximately 2-3 mm of anterolisthesis of C3 on C4, favored degenerative given severe facet arthropathy at this level. Skull base and vertebrae: No evidence of acute fracture. Vertebral body heights are maintained. Soft tissues and spinal canal: No prevertebral fluid or swelling. No visible canal hematoma. Disc levels: Craniocervical degenerative change narrowing of the median atlantoaxial joint space. Severe facet arthropathy in the upper cervical spine, greatest at C3-C4. Fusion across the right C2-C3 facet joint. Multilevel degenerative disc disease, which is moderate at C4-C5 C5-C6 and C6-C7 with disc space height loss, endplate sclerosis and posterior disc osteophyte complexes. No evidence of advanced bony canal stenosis. Upper chest: Visualized lung  apices are clear. IMPRESSION: CT head: 1. No evidence of acute intracranial abnormality. 2. Similar cerebral atrophy and chronic microvascular ischemic disease. CT cervical spine: 1. No evidence of acute fracture. 2. Approximately 2-3 mm of anterolisthesis of C3 on C4, favored degenerative given severe facet arthropathy at this level. 3. Multilevel degenerative change, as  detailed above. Electronically Signed   By: Feliberto Harts MD   On: 04/06/2020 14:48   DG Shoulder Left  Result Date: 04/06/2020 CLINICAL DATA:  Fall with shoulder pain EXAM: LEFT SHOULDER - 2+ VIEW COMPARISON:  None. FINDINGS: No fracture or malalignment. AC joint and glenohumeral degenerative change. IMPRESSION: No acute osseous abnormality. Electronically Signed   By: Jasmine Pang M.D.   On: 04/06/2020 15:23   ASSESSMENT AND PLAN:  Martie Muhlbauer  is a 87 y.o. female with a known history of lower extremity lymphedema chronic, history of hypertension not on any meds, chronic venous stasis changes, history of cellulitis lower extremity, grade 2 diastolic dysfunction comes to the emergency room from home after EMS was called since patient slid off from her bed this morning.  Acute congestive heart failure suspect diastolic with history of great toe diastolic dysfunction per echo 0277 Hypertensive heart disease/uncontrolled hypertension -- admit patient to cardiac floor -- IV Lasix 20 mg BID--change to oral from am -- Toprol-XL 25 daily--d/ced due to bradycardia --cont lisinopril 2.5 mg daily -- monitor input output -- patient has known history of hypertension long-term however she does not like to take meds according to PCP notes.  Uncontrolled hypertension -- treatment as above  Bilateral lower extremity venous stasis and chronic lymphedema -- no evidence of cellulitis -- continue to monitor  Abnormal UA -- patient denies any dysuria fever -- white count normal -- received IV Rocephin in the ER. Will continue to monitor urine culture and treat if needed  Generalized deconditioning with fall -- per grandson patient most of the time is bedbound occasionally transfers to wheelchair by herself. -- TOC for discharge planning--APS report has been made  -- palliative care for goals of care   Family Communication : Gar Ponto on the phone 3/28 Consults : palliative  care Code Status : DNR/DNI--per [name] DVT prophylaxis : Lovenox Level of care: Progressive Cardiac Level of care: Progressive Cardiac Status is: Inpatient  Remains inpatient appropriate because:Inpatient level of care appropriate due to severity of illness   Dispo: The patient is from: Home              Anticipated d/c is to: Home              Patient currently is not medically stable to d/c.   Difficult to place patient No  cont to monitor for one more day and if remains stable d/c in am    TOTAL TIME TAKING CARE OF THIS PATIENT: 25 minutes.  >50% time spent on counselling and coordination of care  Note: This dictation was prepared with Dragon dictation along with smaller phrase technology. Any transcriptional errors that result from this process are unintentional.  Enedina Finner M.D    [facility]   CC: Primary care physician; Mick Sell, MDPatient ID: [name], female   DOB: 1926-05-28, 87 y.o.   MRN: [id number]

## 2020-04-07 NOTE — ED Notes (Signed)
Pt given breakfast tray and assisted with opening containers.

## 2020-04-07 NOTE — ED Notes (Signed)
Purewick placed for pt comfort and also to monitor I&O's.   IP MD secured chatted about BP meds due to low BP and HR, Lisinopril and Lopressor not given at this time due to low BP and low MAP. ASA and Lasix given.

## 2020-04-07 NOTE — ED Notes (Signed)
Pt resting comfortably at this time. NAD noted. Normal rise and fall of chest. Call bell in reach.

## 2020-04-07 NOTE — Progress Notes (Signed)
   04/07/20 1822  Vitals  Temp 98 F (36.7 C)  Temp Source Oral  BP 125/62  MAP (mmHg) 76  BP Location Right Arm  BP Method Automatic  Patient Position (if appropriate) Lying  Pulse Rate (!) 52  Resp 18  MEWS COLOR  MEWS Score Color Green  Oxygen Therapy  SpO2 94 %  O2 Device Room Air  Height and Weight  Weight 78.1 kg  Type of Scale Used Bed (pt said she couldn't stand)  Type of Weight Actual  BMI (Calculated) 26.96  MEWS Score  MEWS Temp 0  MEWS Systolic 0  MEWS Pulse 0  MEWS RR 0  MEWS LOC 0  MEWS Score 0   Pt arrived and oriented to unit. VSS.  Will handoff report to night, RN

## 2020-04-07 NOTE — Care Plan (Signed)
PMT note:  Patient is resting in bed. No family at bedside. Patient tells me she is widowed with 2 children. She states she lives alone, and then states a total of 8 people live in her house. She states she sees her grandson every day. She states she is not able to do things anymore like she used to.  She focuses on telling me she lives one day at a time because that's all you can do, and that you can't live forever. Also that she talks to God. Attempted to reach son unsuccessfully. Will reattempt tomorrow.  No charge.

## 2020-04-08 ENCOUNTER — Inpatient Hospital Stay
Admit: 2020-04-08 | Discharge: 2020-04-08 | Disposition: A | Discharge status: Home / Self Care | Payer: Medicare HMO | Attending: Internal Medicine | Admitting: Internal Medicine

## 2020-04-08 ENCOUNTER — Other Ambulatory Visit: Payer: Self-pay

## 2020-04-08 DIAGNOSIS — W19XXXA Unspecified fall, initial encounter: Secondary | ICD-10-CM | POA: Diagnosis not present

## 2020-04-08 DIAGNOSIS — I5031 Acute diastolic (congestive) heart failure: Secondary | ICD-10-CM | POA: Diagnosis not present

## 2020-04-08 LAB — CBC
HCT: 31 % — ABNORMAL LOW (ref 36.0–46.0)
Hemoglobin: 9.8 g/dL — ABNORMAL LOW (ref 12.0–15.0)
MCH: 30.2 pg (ref 26.0–34.0)
MCHC: 31.6 g/dL (ref 30.0–36.0)
MCV: 95.7 fL (ref 80.0–100.0)
Platelets: 228 10*3/uL (ref 150–400)
RBC: 3.24 MIL/uL — ABNORMAL LOW (ref 3.87–5.11)
RDW: 13.2 % (ref 11.5–15.5)
WBC: 3.9 10*3/uL — ABNORMAL LOW (ref 4.0–10.5)
nRBC: 0 % (ref 0.0–0.2)

## 2020-04-08 LAB — ECHOCARDIOGRAM COMPLETE
AR max vel: 1.65 cm2
AV Area VTI: 1.67 cm2
AV Area mean vel: 1.63 cm2
AV Mean grad: 5 mmHg
AV Peak grad: 10.1 mmHg
Ao pk vel: 1.59 m/s
Area-P 1/2: 1.97 cm2
Height: 67 in
MV VTI: 1.27 cm2
S' Lateral: 3.5 cm
Weight: 2744.29 oz

## 2020-04-08 LAB — BASIC METABOLIC PANEL
Anion gap: 8 (ref 5–15)
BUN: 34 mg/dL — ABNORMAL HIGH (ref 8–23)
CO2: 25 mmol/L (ref 22–32)
Calcium: 8.7 mg/dL — ABNORMAL LOW (ref 8.9–10.3)
Chloride: 108 mmol/L (ref 98–111)
Creatinine, Ser: 1.03 mg/dL — ABNORMAL HIGH (ref 0.44–1.00)
GFR, Estimated: 51 mL/min — ABNORMAL LOW (ref >60)
Glucose, Bld: 87 mg/dL (ref 70–99)
Potassium: 3.6 mmol/L (ref 3.5–5.1)
Sodium: 141 mmol/L (ref 135–145)

## 2020-04-08 LAB — MAGNESIUM: Magnesium: 2 mg/dL (ref 1.7–2.4)

## 2020-04-08 NOTE — Progress Notes (Signed)
*  PRELIMINARY RESULTS* Echocardiogram 2D Echocardiogram has been performed.  [name] [name] 04/08/2020, 11:37 AM

## 2020-04-08 NOTE — Progress Notes (Signed)
PROGRESS NOTE    [name] PatrickDorothy [name]  WUJ:[id number] DOB: 07-10-1926 DOA: 04/06/2020 PCP: Mick SellFitzgerald, David P, MD  [phone]   Assessment & Plan:   Active Problems:   Troponin I above reference range   Acute CHF (congestive heart failure) (HCC)   Fall   [name], history of hypertension not on any meds, chronic venous stasis changes, history of cellulitis lower extremity, grade 2 diastolic dysfunction comes to the emergency room from home after EMS was called since patient slid off from her bed this morning.  Generalized deconditioning with fall --per grandson patient most of the time is bedbound occasionally transfers to wheelchair by herself. --pan scan showed no fracture --Pt's insurance precludes SNF rehab and many [facility] companies. --family confirmed they provide 24-hour care at home --[facility] --outpatient palliative followup for monitoring of pt's status at home and education.  CHF ruled out --no hypoxia on presentation.  CXR unremarkable.  Pt denied dyspnea.  LE edema is from chronic lymphedema and venous stasis. --Cr worsened with diuresis, further suggesting pt did not have fluid overload Plan: --hold diuretic  Uncontrolled hypertension --BP has been wnl  --cont home Lisinopril  Bilateral lower extremity venous stasis and chronic lymphedema --no evidence of cellulitis --avoid aggressive diuresis to treat LE edema  Asymptomatic bacteruria  --patient denies any dysuria fever --white count normal --received IV Rocephin in the ER.  --Hold further abx  --palliative care for goals of care   DVT prophylaxis: Lovenox SQ Code Status: DNR  Family Communication:  Level of care: Progressive Cardiac Dispo:   The patient is from: home Anticipated d/c is to: home Anticipated d/c date is: tomorrow   Subjective and Interval History:  Pt denied  pain.  Asked me how long she has been in the hospital.  Wanted to return home.  Told me her family takes care of her at home.   Objective: Vitals:   04/08/20 1100 04/08/20 1138 04/08/20 1140 04/08/20 1554  BP:  (!) 115/49 (!) 115/49 123/68  Pulse:  (!) 52 (!) 51 (!) 56  Resp:    18  Temp:  97.9 F (36.6 C) 97.9 F (36.6 C) 98 F (36.7 C)  TempSrc:  Oral Oral Oral  SpO2:  98% 99% 98%  Weight: 78.4 kg     Height:        Intake/Output Summary (Last 24 hours) at 04/08/2020 2045 Last data filed at 04/08/2020 1357 Gross per 24 hour  Intake 600 ml  Output --  Net 600 ml   Filed Weights   04/07/20 1822 04/08/20 0437 04/08/20 1100  Weight: 78.1 kg 77.8 kg 78.4 kg    Examination:   Constitutional: NAD, alert, oriented to self HEENT: conjunctivae and lids normal, EOMI CV: No cyanosis.   RESP: normal respiratory effort, on RA Extremities: chronic edema in BLE SKIN: warm, dry.  Ruptured blister over lateral left foot. Neuro: II - XII grossly intact.   Psych: Normal mood and affect.     Data Reviewed: I have personally reviewed following labs and imaging studies  CBC: Recent Labs  Lab 04/06/20 1407 04/06/20 1716 04/08/20 0916  WBC 5.2 5.5 3.9*  NEUTROABS 3.9  --   --   HGB 12.4 11.3* 9.8*  HCT 39.4 35.7* 31.0*  MCV 94.7 95.7 95.7  PLT 267 231 228   Basic Metabolic Panel: Recent Labs  Lab 04/06/20 1407 04/06/20 1716 04/07/20 0513 04/08/20 0916  NA  142  --  143 141  K 3.8  --  3.8 3.6  CL 111  --  112* 108  CO2 24  --  24 25  GLUCOSE 118*  --  90 87  BUN 26*  --  33* 34*  CREATININE 0.96 0.92 1.21* 1.03*  CALCIUM 9.2  --  8.8* 8.7*  MG 2.2  --   --  2.0   GFR: Estimated Creatinine Clearance: 37.6 mL/min (A) (by C-G formula based on SCr of 1.03 mg/dL (H)). Liver Function Tests: Recent Labs  Lab 04/06/20 1407  AST 40  ALT 20  ALKPHOS 66  BILITOT 0.7  PROT 7.4  ALBUMIN 3.5   No results for input(s): LIPASE, AMYLASE in the last 168 hours. No results  for input(s): AMMONIA in the last 168 hours. Coagulation Profile: No results for input(s): INR, PROTIME in the last 168 hours. Cardiac Enzymes: No results for input(s): CKTOTAL, CKMB, CKMBINDEX, TROPONINI in the last 168 hours. BNP (last 3 results) No results for input(s): PROBNP in the last 8760 hours. HbA1C: No results for input(s): HGBA1C in the last 72 hours. CBG: No results for input(s): GLUCAP in the last 168 hours. Lipid Profile: No results for input(s): CHOL, HDL, LDLCALC, TRIG, CHOLHDL, LDLDIRECT in the last 72 hours. Thyroid Function Tests: Recent Labs    04/06/20 1407  TSH 3.388   Anemia Panel: No results for input(s): VITAMINB12, FOLATE, FERRITIN, TIBC, IRON, RETICCTPCT in the last 72 hours. Sepsis Labs: No results for input(s): PROCALCITON, LATICACIDVEN in the last 168 hours.  Recent Results (from the past 240 hour(s))  Urine Culture     Status: Abnormal (Preliminary result)   Collection Time: 04/06/20  2:07 PM   Specimen: Urine, Random  Result Value Ref Range Status   Specimen Description   Final    URINE, RANDOM Performed at [facility], [address] [place], [place] [address]    Special Requests   Final    NONE Performed at [facility], [address], [place], [place] [address]    Culture (A)  Final    >=100,000 COLONIES/mL ESCHERICHIA COLI SUSCEPTIBILITIES TO FOLLOW Performed at [facility], [address], [place], [place] [address]    Report Status PENDING  Incomplete  Resp Panel by RT-PCR (Flu A&B, Covid) Nasopharyngeal Swab     Status: None   Collection Time: 04/06/20  3:36 PM   Specimen: Nasopharyngeal Swab; Nasopharyngeal(NP) swabs in vial transport medium  Result Value Ref Range Status   SARS Coronavirus 2 by RT PCR NEGATIVE NEGATIVE Final    Comment: (NOTE) SARS-CoV-2 target nucleic acids are NOT DETECTED.  The SARS-CoV-2 RNA is generally detectable in upper respiratory specimens during the acute phase of  infection. The lowest concentration of SARS-CoV-2 viral copies this assay can detect is 138 copies/mL. A negative result does not preclude SARS-Cov-2 infection and should not be used as the sole basis for treatment or other patient management decisions. A negative result may occur with  improper specimen collection/handling, submission of specimen other than nasopharyngeal swab, presence of viral mutation(s) within the areas targeted by this assay, and inadequate number of viral copies(<138 copies/mL). A negative result must be combined with clinical observations, patient history, and epidemiological information. The expected result is Negative.  Fact Sheet for Patients:  BloggerCourse.com  Fact Sheet for Healthcare Providers:  SeriousBroker.it  This test is no t yet approved or cleared by the [place] and  has been authorized for  detection and/or diagnosis of SARS-CoV-2 by FDA under an Emergency Use Authorization (EUA). This EUA will remain  in effect (meaning this test can be used) for the duration of the COVID-19 declaration under Section 564(b)(1) of the Act, 21 U.S.C.section 360bbb-3(b)(1), unless the authorization is terminated  or revoked sooner.       Influenza A by PCR NEGATIVE NEGATIVE Final   Influenza B by PCR NEGATIVE NEGATIVE Final    Comment: (NOTE) The Xpert Xpress SARS-CoV-2/FLU/RSV plus assay is intended as an aid in the diagnosis of influenza from Nasopharyngeal swab specimens and should not be used as a sole basis for treatment. Nasal washings and aspirates are unacceptable for Xpert Xpress SARS-CoV-2/FLU/RSV testing.  Fact Sheet for Patients: BloggerCourse.com  Fact Sheet for Healthcare Providers: SeriousBroker.it  This test is not yet approved or cleared by the [place] FDA and has been authorized for detection and/or diagnosis of SARS-CoV-2  by FDA under an Emergency Use Authorization (EUA). This EUA will remain in effect (meaning this test can be used) for the duration of the COVID-19 declaration under Section 564(b)(1) of the Act, 21 U.S.C. section 360bbb-3(b)(1), unless the authorization is terminated or revoked.  Performed at [facility], [address] [place], [place] [address]       Radiology Studies: ECHOCARDIOGRAM COMPLETE  Result Date: 04/08/2020    ECHOCARDIOGRAM REPORT   Patient Name:   [name] [name] Date of Exam: 04/08/2020 Medical Rec #:  [id number]       Height:       67.0 in Accession #:    [id number]      Weight:       171.5 lb Date of Birth:  [birth year]       BSA:          1.894 m Patient Age:    87 years        BP:           132/63 mmHg Patient Gender: F               HR:           51 bpm. Exam Location:  [facility] Procedure: Color Doppler and Cardiac Doppler Indications:     I50.31 CHF-Acute Diastolic  History:         Patient has no prior history of Echocardiogram examinations.                  Cellulitis.  Sonographer:     Humphrey Rolls RDCS (AE) Referring Phys:  [id number] SONA PATEL Diagnosing Phys: Arnoldo Hooker MD  Sonographer Comments: Technically difficult study due to poor echo windows. Image acquisition challenging due to uncooperative patient. IMPRESSIONS  1. Left ventricular ejection fraction, by estimation, is 60 to 65%. The left ventricle has normal function. The left ventricle has no regional wall motion abnormalities. Left ventricular diastolic parameters are consistent with Grade II diastolic dysfunction (pseudonormalization).  2. Right ventricular systolic function is normal. The right ventricular size is normal.  3. Left atrial size was mildly dilated.  4. The mitral valve is normal in structure. Mild to moderate mitral valve regurgitation.  5. The aortic valve is normal in structure. Aortic valve regurgitation is trivial. Mild aortic valve sclerosis is present, with no evidence of aortic valve  stenosis. FINDINGS  Left Ventricle: Left ventricular ejection fraction, by estimation, is 60 to 65%. The left ventricle has normal function. The left ventricle has no regional wall motion abnormalities. The left ventricular internal cavity size was  small. There is no left ventricular hypertrophy. Left ventricular diastolic parameters are consistent with Grade II diastolic dysfunction (pseudonormalization). Right Ventricle: The right ventricular size is normal. No increase in right ventricular wall thickness. Right ventricular systolic function is normal. Left Atrium: Left atrial size was mildly dilated. Right Atrium: Right atrial size was normal in size. Pericardium: There is no evidence of pericardial effusion. Mitral Valve: The mitral valve is normal in structure. Mild to moderate mitral valve regurgitation. MV peak gradient, 5.4 mmHg. The mean mitral valve gradient is 2.0 mmHg. Tricuspid Valve: The tricuspid valve is normal in structure. Tricuspid valve regurgitation is mild. Aortic Valve: The aortic valve is normal in structure. Aortic valve regurgitation is trivial. Mild aortic valve sclerosis is present, with no evidence of aortic valve stenosis. Aortic valve mean gradient measures 5.0 mmHg. Aortic valve peak gradient measures 10.1 mmHg. Aortic valve area, by VTI measures 1.67 cm. Pulmonic Valve: The pulmonic valve was normal in structure. Pulmonic valve regurgitation is not visualized. Aorta: The aortic root and ascending aorta are structurally normal, with no evidence of dilitation. IAS/Shunts: No atrial level shunt detected by color flow Doppler.  LEFT VENTRICLE PLAX 2D LVIDd:         4.70 cm  Diastology LVIDs:         3.50 cm  LV e' medial:    2.94 cm/s LV PW:         1.20 cm  LV E/e' medial:  42.5 LV IVS:        1.00 cm  LV e' lateral:   4.24 cm/s LVOT diam:     1.80 cm  LV E/e' lateral: 29.5 LV SV:         59 LV SV Index:   31 LVOT Area:     2.54 cm  LEFT ATRIUM             Index LA diam:        4.70  cm 2.48 cm/m LA Vol (A2C):   62.2 ml 32.83 ml/m LA Vol (A4C):   70.7 ml 37.32 ml/m LA Biplane Vol: 67.4 ml 35.58 ml/m  AORTIC VALVE AV Area (Vmax):    1.65 cm AV Area (Vmean):   1.63 cm AV Area (VTI):     1.67 cm AV Vmax:           159.00 cm/s AV Vmean:          100.000 cm/s AV VTI:            0.354 m AV Peak Grad:      10.1 mmHg AV Mean Grad:      5.0 mmHg LVOT Vmax:         103.00 cm/s LVOT Vmean:        64.200 cm/s LVOT VTI:          0.232 m LVOT/AV VTI ratio: 0.66  AORTA Ao Root diam: 3.50 cm MITRAL VALVE MV Area (PHT): 1.97 cm     SHUNTS MV Area VTI:   1.27 cm     Systemic VTI:  0.23 m MV Peak grad:  5.4 mmHg     Systemic Diam: 1.80 cm MV Mean grad:  2.0 mmHg MV Vmax:       1.16 m/s MV Vmean:      64.3 cm/s MV Decel Time: 385 msec MV E velocity: 125.00 cm/s MV A velocity: 91.50 cm/s MV E/A ratio:  1.37 Arnoldo Hooker MD Electronically signed by Arnoldo Hooker MD Signature Date/Time: 04/08/2020/5:14:26 PM  Final      Scheduled Meds: . aspirin EC  81 mg Oral Daily  . enoxaparin (LOVENOX) injection  40 mg Subcutaneous Q24H  . lisinopril  2.5 mg Oral Daily   Continuous Infusions:   LOS: 2 days     Darlin Priestly, MD [facility] If 7PM-7AM, please contact night-coverage 04/08/2020, 8:45 PM

## 2020-04-08 NOTE — TOC Progression Note (Addendum)
Transition of Care [facility]) - Progression Note    Patient Details  Name: [name] [name] MRN: [id number] Date of Birth: [birth year]  Transition of Care [facility]) CM/SW Contact  [name], RN Phone Number: 04/08/2020, 3:56 PM  Clinical Narrative:  Called and spoke with patients grandson, [name] [name] who reports that patient lives with him and his girlfriend and they both take good care of her. [name] [name] states that his Dad, step Mom and sister are all active with caring for patient. [name] states that every time he brings her to the hospital the accusations are made about her care at home, he loves his grandmother, she raised him and his sister and he will do whatever he can to take care of her. [name] [name] reports that she will never be put in a Nursing Home and he wants her to return home. [name] [name] was not able to remember the name of Agency providing the [facility] services. I reviewed chart and found [facility], called [name] and she says patient is no longer active with them, patient last serviced three weeks ago, with no reported neglect issues, however family including patients were not very nice to staff. Will discuss with Attending and TOC Management on how to proceed.  Attempted to call APS, [phone], no answer left voice message to return call.  APS, [name], returned call took report and will submit information for screening/ follow up.   Called grandson, [name] [name] to inform him that patient will be discharged tomorrow morning and he will need to be home. [name] [name] receptive and understand I will call him once transportation is arranged.   Expected Discharge Plan: Home w Home Health Services Barriers to Discharge: Continued Medical Work up  Expected Discharge Plan and Services Expected Discharge Plan: Home w Home Health Services In-house Referral: Clinical Social Work   Post Acute Care Choice: Home Health Living arrangements for the past 2 months: Single Family Home Expected Discharge Date: 04/08/20                                      Social Determinants of Health (SDOH) Interventions    Readmission Risk Interventions No flowsheet data found.

## 2020-04-08 NOTE — Plan of Care (Addendum)
PMT note:  Patient is resting in bed. She is confused. No family at bedside. Spoke with staff. MD, nursing director, [facility]  director, nurse and [facility] discussing her discharge today, as well as APS referral.    If patient discharges today, recommend palliative to follow with transition to hospice when ready. Will need palliative to see her in home on D/C.   No charge note.

## 2020-04-08 NOTE — Progress Notes (Signed)
I have reviewed the charting completed by the student nurse and I agree with her assessments.  [name], RN 

## 2020-04-08 NOTE — Progress Notes (Deleted)
Daily Progress Note   Patient Name: [name] [name]       Date: 04/08/2020 DOB: August 09, 1926  Age: 87 y.o. MRN#: [id number] Attending Physician: Darlin Priestly, MD Primary Care Physician: Mick Sell, MD Admit Date: 04/06/2020  Reason for Consultation/Follow-up: Establishing goals of care  Subjective: Patient is resting in bed. She is confused. Spoke with staff. MD, nursing director, [facility]  director, nurse and [facility] discussing her discharge today. Recommend palliative to follow.   Length of Stay: 2  Current Medications: Scheduled Meds:  . aspirin EC  81 mg Oral Daily  . enoxaparin (LOVENOX) injection  40 mg Subcutaneous Q24H  . lisinopril  2.5 mg Oral Daily    Continuous Infusions:   PRN Meds: acetaminophen **OR** acetaminophen, ondansetron **OR** ondansetron (ZOFRAN) IV, senna-docusate  Physical Exam Constitutional:      Comments: Confused.   Pulmonary:     Effort: Pulmonary effort is normal.  Neurological:     Mental Status: She is alert.             Vital Signs: BP 123/68 (BP Location: Right Arm)   Pulse (!) 56   Temp 98 F (36.7 C) (Oral)   Resp 18   Ht 5\' 7"  (1.702 m)   Wt 78.4 kg   SpO2 98%   BMI 27.06 kg/m  SpO2: SpO2: 98 % O2 Device: O2 Device: Room Air O2 Flow Rate:    Intake/output summary:   Intake/Output Summary (Last 24 hours) at 04/08/2020 1624 Last data filed at 04/08/2020 1357 Gross per 24 hour  Intake 600 ml  Output --  Net 600 ml   LBM: Last BM Date:  (Unknown per pt) Baseline Weight: Weight: 88 kg Most recent weight: Weight: 78.4 kg         Flowsheet Rows   Flowsheet Row Most Recent Value  Intake Tab   Referral Department Hospitalist  Unit at Time of Referral ER  Date Notified 04/06/20  Palliative Care Type New Palliative care   Reason for referral Clarify Goals of Care  Date of Admission 04/06/20  # of days IP prior to Palliative referral 0  Clinical Assessment   Psychosocial & Spiritual Assessment   Palliative Care Outcomes       Patient Active Problem List   Diagnosis Date Noted  . Superficial bruising of arm,  left, initial encounter   . Acute CHF (congestive heart failure) (HCC) 04/06/2020  . HTN (hypertension), malignant   . Lymphedema   . Fall   . Stage 3b chronic kidney disease (HCC)   . B12 deficiency   . Anemia due to vitamin B12 deficiency   . Acute metabolic encephalopathy   . General weakness   . Confusion   . Acute lower UTI 01/07/2020  . E. coli UTI 01/07/2020  . Chronic venous stasis dermatitis of both lower extremities 01/07/2020  . Cellulitis 03/06/2017  . Left leg cellulitis 11/25/2016  . AKI (acute kidney injury) (HCC) 11/05/2016  . Decubitus ulcer of ischium, stage 2, left (HCC) 11/05/2016  . Troponin I above reference range 11/04/2016    Palliative Care Assessment & Plan   Recommendations/Plan:  Staff members discussing her D/C today. Recommend palliative to follow outpatient.     Code Status:    Code Status Orders  (From admission, onward)         Start     Ordered   04/06/20 1826  Do not attempt resuscitation (DNR)  Continuous       Question Answer Comment  In the event of cardiac or respiratory ARREST Do not call a "code blue"   In the event of cardiac or respiratory ARREST Do not perform Intubation, CPR, defibrillation or ACLS   In the event of cardiac or respiratory ARREST Use medication by any route, position, wound care, and other measures to relive pain and suffering. May use oxygen, suction and manual treatment of airway obstruction as needed for comfort.   Comments D/w son Tallyn Holroyd      04/06/20 1825        Code Status History    Date Active Date Inactive Code Status Order ID Comments User Context   04/06/2020 1610 04/06/2020 1825 Full Code  [id number]  Enedina Finner, MD ED   01/07/2020 1722 01/21/2020 2136 Full Code [id number]  Jacques Navy, MD ED   03/06/2017 1958 03/09/2017 1852 Full Code [id number]  Bertrum Sol, MD Inpatient   11/25/2016 0238 11/28/2016 2229 Full Code [id number]  Oralia Manis, MD Inpatient   11/05/2016 0104 11/07/2016 2237 Full Code [id number]  Arnaldo Natal, MD ED   Advance Care Planning Activity       Prognosis:  Poor overall    Care plan was discussed with: see above note  Thank you for allowing the Palliative Medicine Team to assist in the care of this patient.   Total Time 15 min Prolonged Time Billed  no      Greater than 50%  of this time was spent counseling and coordinating care related to the above assessment and plan.  Morton Stall, NP  Please contact Palliative Medicine Team phone at [phone] for questions and concerns.

## 2020-04-08 NOTE — Progress Notes (Incomplete)
*  PRELIMINARY RESULTS* Echocardiogram 2D Echocardiogram has been performed.  [name] [name] [name] [name] 04/08/2020, 11:37 AM 

## 2020-04-09 LAB — URINE CULTURE: Culture: >=100000 — AB

## 2020-04-09 NOTE — Progress Notes (Signed)
Patient discharged at this time via stretcher by EMS. Tele box/PIV removed by Clinical research associate. AVS printed, DNR paper work placed in Education administrator by Clinical research associate. VSS upon departure. NAD noted.

## 2020-04-09 NOTE — TOC Transition Note (Signed)
Transition of Care [facility]) - CM/SW Discharge Note   Patient Details  Name: [name] [name] MRN: [id number] Date of Birth: [birth year]  [facility] Care [facility]) CM/SW Contact:  [name], RN Phone Number: 04/09/2020, 11:51 AM   Clinical Narrative:  Received denial from [facility], unable to provide services for patient. Corky Mull from [facility], they will be able to service patient with [facility] Nursing services. Called and spoke with grandson, [name] [name] and he states that they have already called and will be coming out on Monday. I advised [name] [name] to be receptive to the care and answer phone calls to assure patient is receiving the care needed, [name] [name] voices understanding.     Final next level of care: Home w Home Health Services Barriers to Discharge: Barriers Resolved   Patient Goals and CMS Choice Patient states their goals for this hospitalization and ongoing recovery are:: Per Valorie Roosevelt to return home, refuses to have patient go to SNF.   Choice offered to / list presented to : NA  Discharge Placement                  Name of family member notified: [name] [name] and his girlfriend [name] [name] Patient and family notified of of transfer: 04/09/20  Discharge Plan and Services In-house Referral: Clinical Social Work   Post Acute Care Choice: Home Health          DME Arranged: N/A DME Agency: NA       HH Arranged: PT,OT,RN HH Agency: Other - See comment ([facility]) Date HH Agency Contacted: 04/09/20 Time HH Agency Contacted: [phone] Representative spoke with at [facility] Agency: Mellissa Kohut  Social Determinants of Health (SDOH) Interventions     Readmission Risk Interventions No flowsheet data found.

## 2020-04-09 NOTE — Discharge Summary (Signed)
Physician Discharge Summary   [name] [name]  female DOB: [birth year]  WUJ:[id number]  PCP: Mick Sell, MD  Admit date: 04/06/2020 Discharge date: 04/09/2020  Admitted From: home Disposition:  home CODE STATUS: DNR  Discharge Instructions    Discharge instructions   Complete by: As directed    You have had a fall from bed.  Extensive CT scans and xrays showed no fracture. - -      30 Day Unplanned Readmission Risk Score   Flowsheet Row ED to Hosp-Admission (Current) from 04/06/2020 in [facility]  30 Day Unplanned Readmission Risk Score (%) 18.61 Filed at 04/09/2020 0801     This score is the patient's risk of an unplanned readmission within 30 days of being discharged (0 -100%). The score is based on dignosis, age, lab data, medications, orders, and past utilization.   Low:  0-14.9   Medium: 15-21.9   High: 22-29.9   Extreme: 30 and above         Hospital Course:  For full details, please see H&P, progress notes, consult notes and ancillary notes.  Briefly,  [name], history of hypertension not on any meds, chronic venous stasis changes, history of cellulitis lower extremity, grade 2 diastolic dysfunction who was brought to the emergency room from home after EMS was called since patient slid off from her bed the morning of.  Generalized deconditioning with fall --per grandson patient most of the time is bedbound, occasionally transfers to wheelchair by herself. --pan scan showed no fracture --Pt's insurance precludes SNF rehab and many [facility] companies. --family confirmed they provide 24-hour care at home --outpatient APS followup, filed by [facility] --outpatient palliative followup for monitoring of pt's status at home and education.  CHF ruled out --no hypoxia on presentation.  CXR unremarkable.  Pt denied dyspnea.  LE edema is from chronic lymphedema and  venous stasis. --Pt was treated with IV diuresis on admission with worsening Cr, further suggesting pt did not have fluid overload.  Diuretic stopped.  Uncontrolled hypertension --BP was elevated to 170's briefly on presentation, likely due to pain and stress.  Since admission, BP has been wnl. --cont home Lisinopril  Bilateral lower extremity venous stasis and chronic lymphedema --no evidence of cellulitis --avoid aggressive diuresis to treat LE edema  Asymptomatic bacteruria  --patient denies any dysuria fever --white count normal --received IV Rocephin in the ER.  --Hold further abx   Discharge Diagnoses:  Active Problems:   Troponin I above reference range   Acute CHF (congestive heart failure) (HCC)   Fall    Discharge Instructions:  Allergies as of 04/09/2020   No Known Allergies     Medication List    TAKE these medications   acetaminophen 325 MG tablet Commonly known as: TYLENOL Take 2 tablets (650 mg total) every 6 (six) hours as needed by mouth for mild pain (or Fever >/= 101).   cyanocobalamin 1000 MCG tablet Take 1 tablet (1,000 mcg total) by mouth daily.   traZODone 50 MG tablet Commonly known as: DESYREL Take 0.5 tablets (25 mg total) by mouth at bedtime as needed for sleep.        Follow-up Information    Mick Sell, MD. Schedule an appointment as soon as possible for a visit on 04/15/2020.   Specialty: Infectious Diseases Why: @ 11am Patient will see the PA. Contact information: [address] [place] [address] [phone]  No Known Allergies   The results of significant diagnostics from this hospitalization (including imaging, microbiology, ancillary and laboratory) are listed below for reference.   Consultations:   Procedures/Studies: DG Chest 2 View  Result Date: 04/06/2020 CLINICAL DATA:  Fall EXAM: CHEST - 2 VIEW COMPARISON:  11/24/2016 FINDINGS: No focal opacity or pleural  effusion. Stable cardiomediastinal silhouette with borderline cardiac size. Aortic atherosclerosis. No pneumothorax. No definite acute osseous abnormality. IMPRESSION: No active cardiopulmonary disease. Electronically Signed   By: Jasmine PangKim  Fujinaga M.D.   On: 04/06/2020 15:16   DG Pelvis 1-2 Views  Result Date: 04/06/2020 CLINICAL DATA:  Fall EXAM: PELVIS - 1-2 VIEW COMPARISON:  None. FINDINGS: SI joints are non widened. Pubic symphysis and rami appear intact. Both femoral heads project in joint. No fracture or malalignment is seen. IMPRESSION: Negative. Electronically Signed   By: Jasmine PangKim  Fujinaga M.D.   On: 04/06/2020 15:21   DG Elbow Complete Left  Result Date: 04/06/2020 CLINICAL DATA:  Larey SeatFell out of bed EXAM: LEFT ELBOW - COMPLETE 3+ VIEW COMPARISON:  None. FINDINGS: No fracture or malalignment. No definitive elbow effusion. Spurring at the radial head neck junction and coronoid process of the ulna. IMPRESSION: No definite acute osseous abnormality. Electronically Signed   By: Jasmine PangKim  Fujinaga M.D.   On: 04/06/2020 15:19   DG Wrist Complete Left  Result Date: 04/06/2020 CLINICAL DATA:  Fall with wrist pain EXAM: LEFT WRIST - COMPLETE 3+ VIEW COMPARISON:  None. FINDINGS: No fracture or malalignment. Degenerative changes at the first [facility] joint and radiocarpal joint. Soft tissues are unremarkable. IMPRESSION: No acute osseous abnormality. Electronically Signed   By: Jasmine PangKim  Fujinaga M.D.   On: 04/06/2020 15:24   CT Head Wo Contrast  Result Date: 04/06/2020 CLINICAL DATA:  Trauma.  Fall. EXAM: CT HEAD WITHOUT CONTRAST CT CERVICAL SPINE WITHOUT CONTRAST TECHNIQUE: Multidetector CT imaging of the head and cervical spine was performed following the standard protocol without intravenous contrast. Multiplanar CT image reconstructions of the cervical spine were also generated. COMPARISON:  CT head November 04, 2016. FINDINGS: CT HEAD FINDINGS Brain: No evidence of acute large vascular territory infarction, hemorrhage,  hydrocephalus, extra-axial collection or mass lesion/mass effect. Similar generalized cerebral volume loss with ex vacuo ventricular dilation. Similar white matter hypoattenuation, which is nonspecific but most likely related to chronic microvascular ischemic disease given patient age. Vascular: No hyperdense vessel identified. Calcific atherosclerosis. Skull: No acute fracture. Sinuses/Orbits: Mild ethmoid mucosal thickening. No air-fluid levels. Other: No mastoid effusions. CT CERVICAL SPINE FINDINGS Alignment: Reversal of the normal cervical lordosis. Approximately 2-3 mm of anterolisthesis of C3 on C4, favored degenerative given severe facet arthropathy at this level. Skull base and vertebrae: No evidence of acute fracture. Vertebral body heights are maintained. Soft tissues and spinal canal: No prevertebral fluid or swelling. No visible canal hematoma. Disc levels: Craniocervical degenerative change narrowing of the median atlantoaxial joint space. Severe facet arthropathy in the upper cervical spine, greatest at C3-C4. Fusion across the right C2-C3 facet joint. Multilevel degenerative disc disease, which is moderate at C4-C5 C5-C6 and C6-C7 with disc space height loss, endplate sclerosis and posterior disc osteophyte complexes. No evidence of advanced bony canal stenosis. Upper chest: Visualized lung apices are clear. IMPRESSION: CT head: 1. No evidence of acute intracranial abnormality. 2. Similar cerebral atrophy and chronic microvascular ischemic disease. CT cervical spine: 1. No evidence of acute fracture. 2. Approximately 2-3 mm of anterolisthesis of C3 on C4, favored degenerative given severe facet arthropathy at this level. 3. Multilevel  degenerative change, as detailed above. Electronically Signed   By: Feliberto Harts MD   On: 04/06/2020 14:48   CT Cervical Spine Wo Contrast  Result Date: 04/06/2020 CLINICAL DATA:  Trauma.  Fall. EXAM: CT HEAD WITHOUT CONTRAST CT CERVICAL SPINE WITHOUT CONTRAST  TECHNIQUE: Multidetector CT imaging of the head and cervical spine was performed following the standard protocol without intravenous contrast. Multiplanar CT image reconstructions of the cervical spine were also generated. COMPARISON:  CT head November 04, 2016. FINDINGS: CT HEAD FINDINGS Brain: No evidence of acute large vascular territory infarction, hemorrhage, hydrocephalus, extra-axial collection or mass lesion/mass effect. Similar generalized cerebral volume loss with ex vacuo ventricular dilation. Similar white matter hypoattenuation, which is nonspecific but most likely related to chronic microvascular ischemic disease given patient age. Vascular: No hyperdense vessel identified. Calcific atherosclerosis. Skull: No acute fracture. Sinuses/Orbits: Mild ethmoid mucosal thickening. No air-fluid levels. Other: No mastoid effusions. CT CERVICAL SPINE FINDINGS Alignment: Reversal of the normal cervical lordosis. Approximately 2-3 mm of anterolisthesis of C3 on C4, favored degenerative given severe facet arthropathy at this level. Skull base and vertebrae: No evidence of acute fracture. Vertebral body heights are maintained. Soft tissues and spinal canal: No prevertebral fluid or swelling. No visible canal hematoma. Disc levels: Craniocervical degenerative change narrowing of the median atlantoaxial joint space. Severe facet arthropathy in the upper cervical spine, greatest at C3-C4. Fusion across the right C2-C3 facet joint. Multilevel degenerative disc disease, which is moderate at C4-C5 C5-C6 and C6-C7 with disc space height loss, endplate sclerosis and posterior disc osteophyte complexes. No evidence of advanced bony canal stenosis. Upper chest: Visualized lung apices are clear. IMPRESSION: CT head: 1. No evidence of acute intracranial abnormality. 2. Similar cerebral atrophy and chronic microvascular ischemic disease. CT cervical spine: 1. No evidence of acute fracture. 2. Approximately 2-3 mm of  anterolisthesis of C3 on C4, favored degenerative given severe facet arthropathy at this level. 3. Multilevel degenerative change, as detailed above. Electronically Signed   By: Feliberto Harts MD   On: 04/06/2020 14:48   DG Shoulder Left  Result Date: 04/06/2020 CLINICAL DATA:  Fall with shoulder pain EXAM: LEFT SHOULDER - 2+ VIEW COMPARISON:  None. FINDINGS: No fracture or malalignment. AC joint and glenohumeral degenerative change. IMPRESSION: No acute osseous abnormality. Electronically Signed   By: Jasmine Pang M.D.   On: 04/06/2020 15:23   ECHOCARDIOGRAM COMPLETE  Result Date: 04/08/2020    ECHOCARDIOGRAM REPORT   Patient Name:   [name] [name] Date of Exam: 04/08/2020 Medical Rec #:  [id number]       Height:       67.0 in Accession #:    [id number]      Weight:       171.5 lb Date of Birth:  1926/10/09       BSA:          1.894 m Patient Age:    87 years        BP:           132/63 mmHg Patient Gender: F               HR:           51 bpm. Exam Location:  [facility] Procedure: Color Doppler and Cardiac Doppler Indications:     I50.31 CHF-Acute Diastolic  History:         Patient has no prior history of Echocardiogram examinations.  Cellulitis.  Sonographer:     Humphrey Rolls RDCS (AE) Referring Phys:  [id number] SONA PATEL Diagnosing Phys: Arnoldo Hooker MD  Sonographer Comments: Technically difficult study due to poor echo windows. Image acquisition challenging due to uncooperative patient. IMPRESSIONS  1. Left ventricular ejection fraction, by estimation, is 60 to 65%. The left ventricle has normal function. The left ventricle has no regional wall motion abnormalities. Left ventricular diastolic parameters are consistent with Grade II diastolic dysfunction (pseudonormalization).  2. Right ventricular systolic function is normal. The right ventricular size is normal.  3. Left atrial size was mildly dilated.  4. The mitral valve is normal in structure. Mild to moderate mitral valve regurgitation.   5. The aortic valve is normal in structure. Aortic valve regurgitation is trivial. Mild aortic valve sclerosis is present, with no evidence of aortic valve stenosis. FINDINGS  Left Ventricle: Left ventricular ejection fraction, by estimation, is 60 to 65%. The left ventricle has normal function. The left ventricle has no regional wall motion abnormalities. The left ventricular internal cavity size was small. There is no left ventricular hypertrophy. Left ventricular diastolic parameters are consistent with Grade II diastolic dysfunction (pseudonormalization). Right Ventricle: The right ventricular size is normal. No increase in right ventricular wall thickness. Right ventricular systolic function is normal. Left Atrium: Left atrial size was mildly dilated. Right Atrium: Right atrial size was normal in size. Pericardium: There is no evidence of pericardial effusion. Mitral Valve: The mitral valve is normal in structure. Mild to moderate mitral valve regurgitation. MV peak gradient, 5.4 mmHg. The mean mitral valve gradient is 2.0 mmHg. Tricuspid Valve: The tricuspid valve is normal in structure. Tricuspid valve regurgitation is mild. Aortic Valve: The aortic valve is normal in structure. Aortic valve regurgitation is trivial. Mild aortic valve sclerosis is present, with no evidence of aortic valve stenosis. Aortic valve mean gradient measures 5.0 mmHg. Aortic valve peak gradient measures 10.1 mmHg. Aortic valve area, by VTI measures 1.67 cm. Pulmonic Valve: The pulmonic valve was normal in structure. Pulmonic valve regurgitation is not visualized. Aorta: The aortic root and ascending aorta are structurally normal, with no evidence of dilitation. IAS/Shunts: No atrial level shunt detected by color flow Doppler.  LEFT VENTRICLE PLAX 2D LVIDd:         4.70 cm  Diastology LVIDs:         3.50 cm  LV e' medial:    2.94 cm/s LV PW:         1.20 cm  LV E/e' medial:  42.5 LV IVS:        1.00 cm  LV e' lateral:   4.24 cm/s  LVOT diam:     1.80 cm  LV E/e' lateral: 29.5 LV SV:         59 LV SV Index:   31 LVOT Area:     2.54 cm  LEFT ATRIUM             Index LA diam:        4.70 cm 2.48 cm/m LA Vol (A2C):   62.2 ml 32.83 ml/m LA Vol (A4C):   70.7 ml 37.32 ml/m LA Biplane Vol: 67.4 ml 35.58 ml/m  AORTIC VALVE AV Area (Vmax):    1.65 cm AV Area (Vmean):   1.63 cm AV Area (VTI):     1.67 cm AV Vmax:           159.00 cm/s AV Vmean:          100.000 cm/s AV  VTI:            0.354 m AV Peak Grad:      10.1 mmHg AV Mean Grad:      5.0 mmHg LVOT Vmax:         103.00 cm/s LVOT Vmean:        64.200 cm/s LVOT VTI:          0.232 m LVOT/AV VTI ratio: 0.66  AORTA Ao Root diam: 3.50 cm MITRAL VALVE MV Area (PHT): 1.97 cm     SHUNTS MV Area VTI:   1.27 cm     Systemic VTI:  0.23 m MV Peak grad:  5.4 mmHg     Systemic Diam: 1.80 cm MV Mean grad:  2.0 mmHg MV Vmax:       1.16 m/s MV Vmean:      64.3 cm/s MV Decel Time: 385 msec MV E velocity: 125.00 cm/s MV A velocity: 91.50 cm/s MV E/A ratio:  1.37 Arnoldo Hooker MD Electronically signed by Arnoldo Hooker MD Signature Date/Time: 04/08/2020/5:14:26 PM    Final       Labs: BNP (last 3 results) Recent Labs    04/06/20 1407  BNP 1,037.5*   Basic Metabolic Panel: Recent Labs  Lab 04/06/20 1407 04/06/20 1716 04/07/20 0513 04/08/20 0916  NA 142  --  143 141  K 3.8  --  3.8 3.6  CL 111  --  112* 108  CO2 24  --  24 25  GLUCOSE 118*  --  90 87  BUN 26*  --  33* 34*  CREATININE 0.96 0.92 1.21* 1.03*  CALCIUM 9.2  --  8.8* 8.7*  MG 2.2  --   --  2.0   Liver Function Tests: Recent Labs  Lab 04/06/20 1407  AST 40  ALT 20  ALKPHOS 66  BILITOT 0.7  PROT 7.4  ALBUMIN 3.5   No results for input(s): LIPASE, AMYLASE in the last 168 hours. No results for input(s): AMMONIA in the last 168 hours. CBC: Recent Labs  Lab 04/06/20 1407 04/06/20 1716 04/08/20 0916  WBC 5.2 5.5 3.9*  NEUTROABS 3.9  --   --   HGB 12.4 11.3* 9.8*  HCT 39.4 35.7* 31.0*  MCV 94.7 95.7 95.7   PLT 267 231 228   Cardiac Enzymes: No results for input(s): CKTOTAL, CKMB, CKMBINDEX, TROPONINI in the last 168 hours. BNP: Invalid input(s): POCBNP CBG: No results for input(s): GLUCAP in the last 168 hours. D-Dimer No results for input(s): DDIMER in the last 72 hours. Hgb A1c No results for input(s): HGBA1C in the last 72 hours. Lipid Profile No results for input(s): CHOL, HDL, LDLCALC, TRIG, CHOLHDL, LDLDIRECT in the last 72 hours. Thyroid function studies Recent Labs    04/06/20 1407  TSH 3.388   Anemia work up No results for input(s): VITAMINB12, FOLATE, FERRITIN, TIBC, IRON, RETICCTPCT in the last 72 hours. Urinalysis    Component Value Date/Time   COLORURINE YELLOW (A) 04/06/2020 1407   APPEARANCEUR HAZY (A) 04/06/2020 1407   LABSPEC 1.016 04/06/2020 1407   PHURINE 5.0 04/06/2020 1407   GLUCOSEU NEGATIVE 04/06/2020 1407   HGBUR SMALL (A) 04/06/2020 1407   BILIRUBINUR NEGATIVE 04/06/2020 1407   KETONESUR 5 (A) 04/06/2020 1407   PROTEINUR 30 (A) 04/06/2020 1407   NITRITE NEGATIVE 04/06/2020 1407   LEUKOCYTESUR SMALL (A) 04/06/2020 1407   Sepsis Labs Invalid input(s): PROCALCITONIN,  WBC,  LACTICIDVEN Microbiology Recent Results (from the past 240 hour(s))  Urine Culture  Status: Abnormal   Collection Time: 04/06/20  2:07 PM   Specimen: Urine, Random  Result Value Ref Range Status   Specimen Description   Final    URINE, RANDOM Performed at [facility], [address] [place], [place] [address]    Special Requests   Final    NONE Performed at [facility], [address] [place], [place] [address]    Culture >=100,000 COLONIES/mL ESCHERICHIA COLI (A)  Final   Report Status 04/09/2020 FINAL  Final   Organism ID, Bacteria ESCHERICHIA COLI (A)  Final      Susceptibility   Escherichia coli - MIC*    AMPICILLIN 4 SENSITIVE Sensitive     CEFAZOLIN <=4 SENSITIVE Sensitive     CEFEPIME <=0.12 SENSITIVE Sensitive     CEFTRIAXONE  <=0.25 SENSITIVE Sensitive     CIPROFLOXACIN <=0.25 SENSITIVE Sensitive     GENTAMICIN <=1 SENSITIVE Sensitive     IMIPENEM <=0.25 SENSITIVE Sensitive     NITROFURANTOIN <=16 SENSITIVE Sensitive     TRIMETH/SULFA <=20 SENSITIVE Sensitive     AMPICILLIN/SULBACTAM <=2 SENSITIVE Sensitive     PIP/TAZO <=4 SENSITIVE Sensitive     * >=100,000 COLONIES/mL ESCHERICHIA COLI  Resp Panel by RT-PCR (Flu A&B, Covid) Nasopharyngeal Swab     Status: None   Collection Time: 04/06/20  3:36 PM   Specimen: Nasopharyngeal Swab; Nasopharyngeal(NP) swabs in vial transport medium  Result Value Ref Range Status   SARS Coronavirus 2 by RT PCR NEGATIVE NEGATIVE Final    Comment: (NOTE) SARS-CoV-2 target nucleic acids are NOT DETECTED.  The SARS-CoV-2 RNA is generally detectable in upper respiratory specimens during the acute phase of infection. The lowest concentration of SARS-CoV-2 viral copies this assay can detect is 138 copies/mL. A negative result does not preclude SARS-Cov-2 infection and should not be used as the sole basis for treatment or other patient management decisions. A negative result may occur with  improper specimen collection/handling, submission of specimen other than nasopharyngeal swab, presence of viral mutation(s) within the areas targeted by this assay, and inadequate number of viral copies(<138 copies/mL). A negative result must be combined with clinical observations, patient history, and epidemiological information. The expected result is Negative.  Fact Sheet for Patients:  BloggerCourse.com  Fact Sheet for Healthcare Providers:  SeriousBroker.it  This test is no t yet approved or cleared by the [place] FDA and  has been authorized for detection and/or diagnosis of SARS-CoV-2 by FDA under an Emergency Use Authorization (EUA). This EUA will remain  in effect (meaning this test can be used) for the duration of  the COVID-19 declaration under Section 564(b)(1) of the Act, 21 U.S.C.section 360bbb-3(b)(1), unless the authorization is terminated  or revoked sooner.       Influenza A by PCR NEGATIVE NEGATIVE Final   Influenza B by PCR NEGATIVE NEGATIVE Final    Comment: (NOTE) The Xpert Xpress SARS-CoV-2/FLU/RSV plus assay is intended as an aid in the diagnosis of influenza from Nasopharyngeal swab specimens and should not be used as a sole basis for treatment. Nasal washings and aspirates are unacceptable for Xpert Xpress SARS-CoV-2/FLU/RSV testing.  Fact Sheet for Patients: BloggerCourse.com  Fact Sheet for Healthcare Providers: SeriousBroker.it  This test is not yet approved or cleared by the [place] FDA and has been authorized for detection and/or diagnosis of SARS-CoV-2 by FDA under an Emergency Use Authorization (EUA). This EUA will remain in effect (meaning this test can be used) for the duration of the COVID-19  declaration under Section 564(b)(1) of the Act, 21 U.S.C. section 360bbb-3(b)(1), unless the authorization is terminated or revoked.  Performed at [facility], [address] [place], [place] [address]      Total time spend on discharging this patient, including the last patient exam, discussing the hospital stay, instructions for ongoing care as it relates to all pertinent caregivers, as well as preparing the medical discharge records, prescriptions, and/or referrals as applicable, is 40 minutes.    Darlin Priestly, MD  [facility] 04/09/2020, 8:43 AM

## 2020-04-09 NOTE — TOC Transition Note (Signed)
Transition of Care [facility]) - CM/SW Discharge Note   Patient Details  Name: Xochitl Egle MRN: [id number] Date of Birth: 1926-12-26  [facility]) CM/SW Contact:  [name], RN Phone Number: 04/09/2020, 9:38 AM   Clinical Narrative:  Patient to be transported home via [facility] Non emergent EMS. Family notified, [name] and his girlfriend who is a NA, Morrie Sheldon [phone]. HH Agency [facility] referral call, however pending approval.     Final next level of care: Home w Home Health Services Barriers to Discharge: Barriers Resolved   Patient Goals and CMS Choice Patient states their goals for this hospitalization and ongoing recovery are:: Per Valorie Roosevelt to return home, refuses to have patient go to SNF.   Choice offered to / list presented to : NA  Discharge Placement                  Name of family member notified: [name] and his girlfriend Morrie Sheldon Patient and family notified of of transfer: 04/09/20  Discharge Plan and Services In-house Referral: Clinical Social Work   Post Acute Care Choice: Home Health          DME Arranged: N/A DME Agency: NA       HH Arranged: PT,OT,RN HH Agency: Other - See comment ([facility]) Date HH Agency Contacted: 04/09/20 Time HH Agency Contacted: [phone] Representative spoke with at [facility] Agency: Mellissa Kohut  Social Determinants of Health (SDOH) Interventions     Readmission Risk Interventions No flowsheet data found.

## 2020-07-31 ENCOUNTER — Other Ambulatory Visit: Payer: Self-pay

## 2020-07-31 DIAGNOSIS — R410 Disorientation, unspecified: Secondary | ICD-10-CM | POA: Insufficient documentation

## 2020-07-31 DIAGNOSIS — I5030 Unspecified diastolic (congestive) heart failure: Secondary | ICD-10-CM | POA: Insufficient documentation

## 2020-07-31 DIAGNOSIS — R011 Cardiac murmur, unspecified: Secondary | ICD-10-CM | POA: Insufficient documentation

## 2020-07-31 DIAGNOSIS — I130 Hypertensive heart and chronic kidney disease with heart failure and stage 1 through stage 4 chronic kidney disease, or unspecified chronic kidney disease: Secondary | ICD-10-CM | POA: Insufficient documentation

## 2020-07-31 DIAGNOSIS — N1832 Chronic kidney disease, stage 3b: Secondary | ICD-10-CM | POA: Insufficient documentation

## 2020-07-31 DIAGNOSIS — R609 Edema, unspecified: Secondary | ICD-10-CM | POA: Diagnosis not present

## 2020-07-31 DIAGNOSIS — R109 Unspecified abdominal pain: Secondary | ICD-10-CM | POA: Insufficient documentation

## 2020-07-31 DIAGNOSIS — F0390 Unspecified dementia without behavioral disturbance: Secondary | ICD-10-CM | POA: Diagnosis not present

## 2020-07-31 LAB — BASIC METABOLIC PANEL
Anion gap: 6 (ref 5–15)
BUN: 25 mg/dL — ABNORMAL HIGH (ref 8–23)
CO2: 21 mmol/L — ABNORMAL LOW (ref 22–32)
Calcium: 8.8 mg/dL — ABNORMAL LOW (ref 8.9–10.3)
Chloride: 112 mmol/L — ABNORMAL HIGH (ref 98–111)
Creatinine, Ser: 0.85 mg/dL (ref 0.44–1.00)
GFR, Estimated: >60 mL/min (ref >60)
Glucose, Bld: 86 mg/dL (ref 70–99)
Potassium: 4.3 mmol/L (ref 3.5–5.1)
Sodium: 139 mmol/L (ref 135–145)

## 2020-07-31 LAB — CBC WITH DIFFERENTIAL/PLATELET
Abs Immature Granulocytes: 0.01 10*3/uL (ref 0.00–0.07)
Basophils Absolute: 0.1 10*3/uL (ref 0.0–0.1)
Basophils Relative: 1 %
Eosinophils Absolute: 0.4 10*3/uL (ref 0.0–0.5)
Eosinophils Relative: 10 %
HCT: 35.5 % — ABNORMAL LOW (ref 36.0–46.0)
Hemoglobin: 10.8 g/dL — ABNORMAL LOW (ref 12.0–15.0)
Immature Granulocytes: 0 %
Lymphocytes Relative: 44 %
Lymphs Abs: 1.5 10*3/uL (ref 0.7–4.0)
MCH: 29.6 pg (ref 26.0–34.0)
MCHC: 30.4 g/dL (ref 30.0–36.0)
MCV: 97.3 fL (ref 80.0–100.0)
Monocytes Absolute: 0.2 10*3/uL (ref 0.1–1.0)
Monocytes Relative: 7 %
Neutro Abs: 1.3 10*3/uL — ABNORMAL LOW (ref 1.7–7.7)
Neutrophils Relative %: 38 %
Platelets: 175 10*3/uL (ref 150–400)
RBC: 3.65 MIL/uL — ABNORMAL LOW (ref 3.87–5.11)
RDW: 13.2 % (ref 11.5–15.5)
WBC: 3.5 10*3/uL — ABNORMAL LOW (ref 4.0–10.5)
nRBC: 0 % (ref 0.0–0.2)

## 2020-07-31 NOTE — ED Triage Notes (Signed)
Pt to ED via EMS from home, pt states starting today she began to have left side flank pain, pt denies burning with urination. Pt states pain is worse with movement. Pt denies blood in urine

## 2020-08-01 ENCOUNTER — Emergency Department: Payer: Medicare HMO

## 2020-08-01 ENCOUNTER — Emergency Department
Admission: EM | Admit: 2020-08-01 | Discharge: 2020-08-01 | Disposition: A | Discharge status: Home / Self Care | Payer: Medicare HMO | Attending: Emergency Medicine | Admitting: Emergency Medicine

## 2020-08-01 ENCOUNTER — Encounter: Payer: Self-pay | Admitting: Radiology

## 2020-08-01 DIAGNOSIS — R109 Unspecified abdominal pain: Secondary | ICD-10-CM | POA: Diagnosis not present

## 2020-08-01 LAB — HEPATIC FUNCTION PANEL
ALT: 7 U/L (ref 0–44)
AST: 14 U/L — ABNORMAL LOW (ref 15–41)
Albumin: 3.4 g/dL — ABNORMAL LOW (ref 3.5–5.0)
Alkaline Phosphatase: 52 U/L (ref 38–126)
Bilirubin, Direct: 0.1 mg/dL (ref 0.0–0.2)
Indirect Bilirubin: 0.4 mg/dL (ref 0.3–0.9)
Total Bilirubin: 0.5 mg/dL (ref 0.3–1.2)
Total Protein: 6.9 g/dL (ref 6.5–8.1)

## 2020-08-01 LAB — URINALYSIS, COMPLETE (UACMP) WITH MICROSCOPIC
Bilirubin Urine: NEGATIVE
Glucose, UA: NEGATIVE mg/dL
Hgb urine dipstick: NEGATIVE
Ketones, ur: NEGATIVE mg/dL
Leukocytes,Ua: NEGATIVE
Nitrite: NEGATIVE
Protein, ur: NEGATIVE mg/dL
Specific Gravity, Urine: 1.018 (ref 1.005–1.030)
pH: 5 (ref 5.0–8.0)

## 2020-08-01 LAB — LIPASE, BLOOD: Lipase: 33 U/L (ref 11–51)

## 2020-08-01 MED ORDER — IOHEXOL 350 MG/ML SOLN
75.0000 mL | Freq: Once | INTRAVENOUS | Status: AC | PRN
  Administered 2020-08-01: 75 mL via INTRAVENOUS

## 2020-08-01 MED ORDER — ALUM & MAG HYDROXIDE-SIMETH 200-200-20 MG/5ML PO SUSP
30.0000 mL | Freq: Once | ORAL | Status: AC
  Administered 2020-08-01: 30 mL via ORAL
  Filled 2020-08-01: qty 30

## 2020-08-01 MED ORDER — SUCRALFATE 1 G PO TABS
1.0000 g | ORAL_TABLET | Freq: Once | ORAL | Status: AC
  Administered 2020-08-01: 1 g via ORAL
  Filled 2020-08-01: qty 1

## 2020-08-01 MED ORDER — PANTOPRAZOLE SODIUM 40 MG PO TBEC
40.0000 mg | DELAYED_RELEASE_TABLET | Freq: Once | ORAL | Status: AC
  Administered 2020-08-01: 40 mg via ORAL
  Filled 2020-08-01: qty 1

## 2020-08-01 NOTE — ED Notes (Signed)
Contacted pts son, [name] to pick pt up as pt is being D/C home. Pts son states, "I guess I will come and get her". Pts son unable to give estimated time of arrival.

## 2020-08-01 NOTE — ED Provider Notes (Signed)
[facility] Emergency Department Provider Note  ____________________________________________   Event Date/Time   First MD Initiated Contact with Patient 08/01/20 [phone]     (approximate)  I have reviewed the triage vital signs and the nursing notes.   HISTORY  Chief Complaint Flank Pain   HPI [name] [name] is a 87 y.o. female  with a known history of lower extremity lymphedema chronic,HTN, mild dementia, diastolic CHF, andmoderate mitral valve regurg who presents for assessment of some left-sided flank pain.  She states she has had discomfort in this area on and off for several months but felt worse last night.  She denies any falls or injuries, headache, earache, sore throat, nausea, vomiting, diarrhea, dysuria, rash chest pain, cough, shortness of breath or any other clear associated sick symptoms.  She denies any other acute concerns at this time.  She has not taken any medications for her symptoms.         Past Medical History:  Diagnosis Date   Cellulitis 10/28/2016   bilateral lower extremities.     Patient Active Problem List   Diagnosis Date Noted   Superficial bruising of arm, left, initial encounter    Acute CHF (congestive heart failure) (HCC) 04/06/2020   HTN (hypertension), malignant    Lymphedema    Fall    Stage 3b chronic kidney disease (HCC)    B12 deficiency    Anemia due to vitamin B12 deficiency    Acute metabolic encephalopathy    General weakness    Confusion    Acute lower UTI 01/07/2020   E. coli UTI 01/07/2020   Chronic venous stasis dermatitis of both lower extremities 01/07/2020   Cellulitis 03/06/2017   Left leg cellulitis 11/25/2016   AKI (acute kidney injury) (HCC) 11/05/2016   Decubitus ulcer of ischium, stage 2, left (HCC) 11/05/2016   Troponin I above reference range 11/04/2016    Past Surgical History:  Procedure Laterality Date   ABDOMINAL HYSTERECTOMY     GANGLION CYST EXCISION      Prior to  Admission medications   Medication Sig Start Date End Date Taking? Authorizing Provider  acetaminophen (TYLENOL) 325 MG tablet Take 2 tablets (650 mg total) every 6 (six) hours as needed by mouth for mild pain (or Fever >/= 101). 11/28/16   Ramonita LabGouru, Aruna, MD  traZODone (DESYREL) 50 MG tablet Take 0.5 tablets (25 mg total) by mouth at bedtime as needed for sleep. 01/20/20   Alford HighlandWieting, Richard, MD  vitamin B-12 1000 MCG tablet Take 1 tablet (1,000 mcg total) by mouth daily. 01/21/20   Alford HighlandWieting, Richard, MD    Allergies Patient has no known allergies.  Family History  Problem Relation Age of Onset   Diabetes Mellitus II Mother    Heart disease Father     Social History Social History   Tobacco Use   Smoking status: Never   Smokeless tobacco: Never  Substance Use Topics   Alcohol use: No   Drug use: No    Review of Systems  Review of Systems  Genitourinary:  Positive for flank pain.     ____________________________________________   PHYSICAL EXAM:  VITAL SIGNS: ED Triage Vitals  Enc Vitals Group     BP 07/31/20 2139 (!) 131/105     Pulse Rate 07/31/20 2139 (!) 53     Resp 07/31/20 2139 16     Temp 07/31/20 2139 97.6 F (36.4 C)     Temp Source 07/31/20 2139 Oral     SpO2  07/31/20 2139 97 %     Weight 07/31/20 2137 230 lb (104.3 kg)     Height 07/31/20 2137 5\' 10"  (1.778 m)     Head Circumference --      Peak Flow --      Pain Score 07/31/20 2137 4     Pain Loc --      Pain Edu? --      Excl. in GC? --    Vitals:   08/01/20 0421 08/01/20 0815  BP: (!) 176/63 (!) 199/71  Pulse: (!) 55 60  Resp: 18 18  Temp: 97.7 F (36.5 C)   SpO2: 100% 95%   Physical Exam Vitals and nursing note reviewed.  Constitutional:      General: She is not in acute distress.    Appearance: She is well-developed.  HENT:     Head: Normocephalic and atraumatic.     Right Ear: External ear normal.     Left Ear: External ear normal.     Nose: Nose normal.  Eyes:      Conjunctiva/sclera: Conjunctivae normal.  Cardiovascular:     Rate and Rhythm: Normal rate and regular rhythm.     Heart sounds: Murmur heard.  Pulmonary:     Effort: Pulmonary effort is normal. No respiratory distress.     Breath sounds: Normal breath sounds.  Abdominal:     Palpations: Abdomen is soft.     Tenderness: There is no abdominal tenderness.  Musculoskeletal:     Cervical back: Neck supple.     Right lower leg: Edema present.     Left lower leg: Edema present.  Skin:    General: Skin is warm and dry.     Capillary Refill: Capillary refill takes less than 2 seconds.  Neurological:     Mental Status: She is alert. Mental status is at baseline. She is disoriented and confused.  Psychiatric:        Mood and Affect: Mood normal.    Very mild left CVA and left flank tenderness without overlying skin changes.  No significant abdominal tenderness. ____________________________________________   LABS (all labs ordered are listed, but only abnormal results are displayed)  Labs Reviewed  URINALYSIS, COMPLETE (UACMP) WITH MICROSCOPIC - Abnormal; Notable for the following components:      Result Value   Color, Urine YELLOW (*)    APPearance HAZY (*)    Bacteria, UA RARE (*)    All other components within normal limits  CBC WITH DIFFERENTIAL/PLATELET - Abnormal; Notable for the following components:   WBC 3.5 (*)    RBC 3.65 (*)    Hemoglobin 10.8 (*)    HCT 35.5 (*)    Neutro Abs 1.3 (*)    All other components within normal limits  BASIC METABOLIC PANEL - Abnormal; Notable for the following components:   Chloride 112 (*)    CO2 21 (*)    BUN 25 (*)    Calcium 8.8 (*)    All other components within normal limits  HEPATIC FUNCTION PANEL - Abnormal; Notable for the following components:   Albumin 3.4 (*)    AST 14 (*)    All other components within normal limits  LIPASE, BLOOD   ____________________________________________  EKG  Sinus rhythm with a ventricular of  61, normal axis, unremarkable intervals without clear evidence of acute ischemia or significant arrhythmia.  ____________________________________________  RADIOLOGY  ED MD interpretation: CT abdomen elbow shows some moderate stool burden and cholelithiasis without evidence of cholecystitis,  diverticulitis, AAA, kidney stone, pyelonephritis, or other clear acute abdominopelvic process.  Official radiology report(s): CT ABDOMEN PELVIS W CONTRAST  Result Date: 08/01/2020 CLINICAL DATA:  Acute left flank pain. EXAM: CT ABDOMEN AND PELVIS WITH CONTRAST TECHNIQUE: Multidetector CT imaging of the abdomen and pelvis was performed using the standard protocol following bolus administration of intravenous contrast. CONTRAST:  43mL OMNIPAQUE IOHEXOL 350 MG/ML SOLN COMPARISON:  None. FINDINGS: Lower chest: No acute abnormality. Hepatobiliary: Minimal cholelithiasis is noted. No significant biliary dilatation is noted. Probable small right hepatic cyst is noted. Pancreas: Unremarkable. No pancreatic ductal dilatation or surrounding inflammatory changes. Spleen: Normal in size without focal abnormality. Adrenals/Urinary Tract: Adrenal glands appear normal. Parapelvic cysts are noted bilaterally. No hydronephrosis or renal obstruction is noted. No renal or ureteral calculi are noted. Urinary bladder is unremarkable. Stomach/Bowel: Stomach is within normal limits. Appendix appears normal. No evidence of bowel wall thickening, distention, or inflammatory changes. Stool is noted throughout the colon. Vascular/Lymphatic: Aortic atherosclerosis. No enlarged abdominal or pelvic lymph nodes. Reproductive: Status post hysterectomy. No adnexal masses. Other: No abdominal wall hernia or abnormality. No abdominopelvic ascites. Musculoskeletal: No acute or significant osseous findings. IMPRESSION: Minimal cholelithiasis. Stool is noted throughout the colon. No acute abnormality seen in the abdomen or pelvis. Aortic Atherosclerosis  (ICD10-I70.0). Electronically Signed   By: Lupita Raider M.D.   On: 08/01/2020 10:48    ____________________________________________   PROCEDURES  Procedure(s) performed (including Critical Care):  Procedures   ____________________________________________   INITIAL IMPRESSION / ASSESSMENT AND PLAN / ED COURSE      Patient presents with above to history exam for assessment of some pain in her left flank and left upper quadrant of abdomen that began yesterday.  On arrival she is afebrile and hemodynamically stable also hypertensive.  On exam.  Some mild left flank and left upper quadrant tenderness without significant CVA tenderness or other significant abdominal tenderness.   Differential includes kidney stone, pyonephritis, peptic ulcer disease, diverticulitis, AAA, cystitis, pancreatitis possible gastritis.  CT abdomen elbow shows some moderate stool burden and cholelithiasis without evidence of cholecystitis, diverticulitis, AAA, kidney stone, pyelonephritis, or other clear acute abdominopelvic process.  BMP shows no significant electrode or metabolic derangements.  CBC shows baseline leukopenia with WBC count 3.5 compared to 3.93 months ago.  Hemoglobin is 10.8 compared to 9.83 months ago.  Hepatic function panel shows no evidence of cholestasis or hepatitis.  Lipase not consistent with pancreatitis.  UA unremarkable for evidence of cystitis or blood.  Overall unclear urology for patient's symptoms is possible she has some gastritis versus symptomatic constipation.  We will trial antacids and the emergency room.  Was able to reach patient's grandson who confirmed patient has dementia and is not oriented at baseline.  On reassessment patient denies any pain in her abdomen is soft nontender throughout.  Given stable vitals with reassuring exam work-up Evalose patient for immediate life-threatening process I think patient stable for close outpatient follow-up.  Discussed plan with  patient's grandson will help patient get close PCP follow-up.  Discharged stable condition.  Strict return precautions provided in writing.      ____________________________________________   FINAL CLINICAL IMPRESSION(S) / ED DIAGNOSES  Final diagnoses:  Flank pain    Medications  iohexol (OMNIPAQUE) 350 MG/ML injection 75 mL (75 mLs Intravenous Contrast Given 08/01/20 1020)  alum & mag hydroxide-simeth (MAALOX/MYLANTA) 200-200-20 MG/5ML suspension 30 mL (30 mLs Oral Given 08/01/20 1128)  sucralfate (CARAFATE) tablet 1 g (1 g Oral Given 08/01/20 1128)  pantoprazole (PROTONIX) EC tablet 40 mg (40 mg Oral Given 08/01/20 1128)     ED Discharge Orders     None        Note:  This document was prepared using Dragon voice recognition software and may include unintentional dictation errors.    Gilles Chiquito, MD 08/01/20 [phone]

## 2020-10-04 ENCOUNTER — Inpatient Hospital Stay
Admission: EM | Admit: 2020-10-04 | Discharge: 2020-10-19 | DRG: 177 | Disposition: A | Discharge status: Skilled Nursing Facility | Payer: Medicare HMO | Attending: Internal Medicine | Admitting: Internal Medicine

## 2020-10-04 ENCOUNTER — Emergency Department: Payer: Medicare HMO

## 2020-10-04 DIAGNOSIS — N1831 Chronic kidney disease, stage 3a: Secondary | ICD-10-CM | POA: Diagnosis present

## 2020-10-04 DIAGNOSIS — U071 COVID-19: Secondary | ICD-10-CM | POA: Diagnosis not present

## 2020-10-04 DIAGNOSIS — E860 Dehydration: Secondary | ICD-10-CM | POA: Diagnosis present

## 2020-10-04 DIAGNOSIS — G9341 Metabolic encephalopathy: Secondary | ICD-10-CM | POA: Diagnosis present

## 2020-10-04 DIAGNOSIS — Z90710 Acquired absence of both cervix and uterus: Secondary | ICD-10-CM

## 2020-10-04 DIAGNOSIS — Z79899 Other long term (current) drug therapy: Secondary | ICD-10-CM

## 2020-10-04 DIAGNOSIS — Z8249 Family history of ischemic heart disease and other diseases of the circulatory system: Secondary | ICD-10-CM

## 2020-10-04 DIAGNOSIS — I129 Hypertensive chronic kidney disease with stage 1 through stage 4 chronic kidney disease, or unspecified chronic kidney disease: Secondary | ICD-10-CM | POA: Diagnosis present

## 2020-10-04 DIAGNOSIS — D519 Vitamin B12 deficiency anemia, unspecified: Secondary | ICD-10-CM | POA: Diagnosis present

## 2020-10-04 DIAGNOSIS — J1282 Pneumonia due to coronavirus disease 2019: Secondary | ICD-10-CM | POA: Diagnosis present

## 2020-10-04 DIAGNOSIS — N390 Urinary tract infection, site not specified: Secondary | ICD-10-CM | POA: Diagnosis present

## 2020-10-04 DIAGNOSIS — D6959 Other secondary thrombocytopenia: Secondary | ICD-10-CM | POA: Diagnosis present

## 2020-10-04 DIAGNOSIS — Z833 Family history of diabetes mellitus: Secondary | ICD-10-CM

## 2020-10-04 DIAGNOSIS — R531 Weakness: Secondary | ICD-10-CM | POA: Diagnosis not present

## 2020-10-04 DIAGNOSIS — Z5329 Procedure and treatment not carried out because of patient's decision for other reasons: Secondary | ICD-10-CM | POA: Diagnosis not present

## 2020-10-04 DIAGNOSIS — E871 Hypo-osmolality and hyponatremia: Secondary | ICD-10-CM | POA: Diagnosis present

## 2020-10-04 DIAGNOSIS — D72819 Decreased white blood cell count, unspecified: Secondary | ICD-10-CM | POA: Diagnosis not present

## 2020-10-04 DIAGNOSIS — F0390 Unspecified dementia without behavioral disturbance: Secondary | ICD-10-CM | POA: Diagnosis present

## 2020-10-04 DIAGNOSIS — I878 Other specified disorders of veins: Secondary | ICD-10-CM | POA: Diagnosis present

## 2020-10-04 LAB — BASIC METABOLIC PANEL
Anion gap: 8 (ref 5–15)
BUN: 22 mg/dL (ref 8–23)
CO2: 24 mmol/L (ref 22–32)
Calcium: 8.5 mg/dL — ABNORMAL LOW (ref 8.9–10.3)
Chloride: 100 mmol/L (ref 98–111)
Creatinine, Ser: 0.93 mg/dL (ref 0.44–1.00)
GFR, Estimated: 58 mL/min — ABNORMAL LOW (ref >60)
Glucose, Bld: 122 mg/dL — ABNORMAL HIGH (ref 70–99)
Potassium: 3.7 mmol/L (ref 3.5–5.1)
Sodium: 132 mmol/L — ABNORMAL LOW (ref 135–145)

## 2020-10-04 LAB — CBC WITH DIFFERENTIAL/PLATELET
Abs Immature Granulocytes: 0.02 10*3/uL (ref 0.00–0.07)
Basophils Absolute: 0 10*3/uL (ref 0.0–0.1)
Basophils Relative: 0 %
Eosinophils Absolute: 0 10*3/uL (ref 0.0–0.5)
Eosinophils Relative: 0 %
HCT: 39.1 % (ref 36.0–46.0)
Hemoglobin: 13 g/dL (ref 12.0–15.0)
Immature Granulocytes: 1 %
Lymphocytes Relative: 19 %
Lymphs Abs: 0.7 10*3/uL (ref 0.7–4.0)
MCH: 31.2 pg (ref 26.0–34.0)
MCHC: 33.2 g/dL (ref 30.0–36.0)
MCV: 93.8 fL (ref 80.0–100.0)
Monocytes Absolute: 0.4 10*3/uL (ref 0.1–1.0)
Monocytes Relative: 10 %
Neutro Abs: 2.7 10*3/uL (ref 1.7–7.7)
Neutrophils Relative %: 70 %
Platelets: 140 10*3/uL — ABNORMAL LOW (ref 150–400)
RBC: 4.17 MIL/uL (ref 3.87–5.11)
RDW: 12.1 % (ref 11.5–15.5)
WBC: 3.8 10*3/uL — ABNORMAL LOW (ref 4.0–10.5)
nRBC: 0 % (ref 0.0–0.2)

## 2020-10-04 LAB — PROCALCITONIN: Procalcitonin: <0.1 ng/mL

## 2020-10-04 MED ORDER — SODIUM CHLORIDE 0.9 % IV BOLUS
500.0000 mL | Freq: Once | INTRAVENOUS | Status: AC
  Administered 2020-10-04: 500 mL via INTRAVENOUS

## 2020-10-04 NOTE — ED Provider Notes (Signed)
[facility] Emergency Department Provider Note   ____________________________________________   I have reviewed the triage vital signs and the nursing notes.   HISTORY  Chief Complaint Weakness   History limited by: Dementia. Some history obtained from family at bedside   HPI [name] [name] is a 87 y.o. female who presents to the emergency department today because of concern for episode of decreased responsiveness and weakness. The patient recently tested positive for covid. Family has noticed weakness over the past couple of days.  Family states that they called 911 yesterday and after evaluation by EMTs patient was not transported to the hospital.  However today family member states that they noticed an episode where the patient had some decreased responsiveness.  They felt like she also was leaning to 1 side.  Patient herself states that she is not sure why she is here in the department.    Records reviewed. Per medical record review patient has a history of dementia.  Past Medical History:  Diagnosis Date   Cellulitis 10/28/2016   bilateral lower extremities.     Patient Active Problem List   Diagnosis Date Noted   Superficial bruising of arm, left, initial encounter    Acute CHF (congestive heart failure) (HCC) 04/06/2020   HTN (hypertension), malignant    Lymphedema    Fall    Stage 3b chronic kidney disease (HCC)    B12 deficiency    Anemia due to vitamin B12 deficiency    Acute metabolic encephalopathy    General weakness    Confusion    Acute lower UTI 01/07/2020   E. coli UTI 01/07/2020   Chronic venous stasis dermatitis of both lower extremities 01/07/2020   Cellulitis 03/06/2017   Left leg cellulitis 11/25/2016   AKI (acute kidney injury) (HCC) 11/05/2016   Decubitus ulcer of ischium, stage 2, left (HCC) 11/05/2016   Troponin I above reference range 11/04/2016    Past Surgical History:  Procedure Laterality Date   ABDOMINAL  HYSTERECTOMY     GANGLION CYST EXCISION      Prior to Admission medications   Medication Sig Start Date End Date Taking? Authorizing Provider  acetaminophen (TYLENOL) 325 MG tablet Take 2 tablets (650 mg total) every 6 (six) hours as needed by mouth for mild pain (or Fever >/= 101). 11/28/16   Ramonita Lab, MD  traZODone (DESYREL) 50 MG tablet Take 0.5 tablets (25 mg total) by mouth at bedtime as needed for sleep. 01/20/20   Alford Highland, MD  vitamin B-12 1000 MCG tablet Take 1 tablet (1,000 mcg total) by mouth daily. 01/21/20   Alford Highland, MD    Allergies Patient has no known allergies.  Family History  Problem Relation Age of Onset   Diabetes Mellitus II Mother    Heart disease Father     Social History Social History   Tobacco Use   Smoking status: Never   Smokeless tobacco: Never  Substance Use Topics   Alcohol use: No   Drug use: No    Review of Systems Reliable ROS unable to be obtained secondary to dementia.   ____________________________________________   PHYSICAL EXAM:  VITAL SIGNS: ED Triage Vitals  Enc Vitals Group     BP 10/04/20 2002 (!) 213/66     Pulse Rate 10/04/20 2002 61     Resp 10/04/20 2002 (!) 23     Temp 10/04/20 2002 100.2 F (37.9 C)     Temp Source 10/04/20 2002 Oral     SpO2  10/04/20 2001 94 %     Weight 10/04/20 2003 183 lb 1.6 oz (83.1 kg)     Height 10/04/20 2003 5\' 6"  (1.676 m)     Head Circumference --      Peak Flow --      Pain Score 10/04/20 2002 0   Constitutional: Awake and alert. Not completely oriented.  Eyes: Conjunctivae are normal.  ENT      Head: Normocephalic and atraumatic.      Nose: No congestion/rhinnorhea.      Mouth/Throat: Mucous membranes are moist.      Neck: No stridor. Hematological/Lymphatic/Immunilogical: No cervical lymphadenopathy. Cardiovascular: Normal rate, regular rhythm.  No murmurs, rubs, or gallops.  Respiratory: Normal respiratory effort without tachypnea nor retractions. Breath  sounds are clear and equal bilaterally. No wheezes/rales/rhonchi. Gastrointestinal: Soft and non tender. No rebound. No guarding.  Genitourinary: Deferred Musculoskeletal: Normal range of motion in all extremities. No lower extremity edema. Neurologic:  Dementia. Moving all extremities.  Skin:  Skin is warm, dry and intact. No rash noted. Psychiatric: Mood and affect are normal. Speech and behavior are normal. Patient exhibits appropriate insight and judgment.  ____________________________________________    LABS (pertinent positives/negatives)  CBC wbc 3.8, hgb 13.0, plt 140 BMP na 132, k 3.7, glu 122, cr 0.93  ____________________________________________   EKG  None  ____________________________________________    RADIOLOGY  CXR Streaky perihilar basilar opacities concerning for pneumonia.  ____________________________________________   PROCEDURES  Procedures  ____________________________________________   INITIAL IMPRESSION / ASSESSMENT AND PLAN / ED COURSE  Pertinent labs & imaging results that were available during my care of the patient were reviewed by me and considered in my medical decision making (see chart for details).   Patient presented to the emergency department today because of concerns for an episode of decreased responsiveness as well as generalized weakness over the past couple of days.  Family states that she did test positive for COVID.  Patient herself does not have any complaints at this time although somewhat unreliable given history of dementia.  Patient with temp of 100.2 here. WBC of 3.8. Patient's cxr is concerning for possible pneumonia. Will check procalcitonin. Will defer antibiotics until procalcitonin.    ____________________________________________   FINAL CLINICAL IMPRESSION(S) / ED DIAGNOSES  Final diagnoses:  Weakness     Note: This dictation was prepared with Dragon dictation. Any transcriptional errors that result from  this process are unintentional     10/06/20, MD 10/04/20 [phone]

## 2020-10-04 NOTE — ED Notes (Signed)
This RN called & spoke with lab RE: RN's unable to get blood work for labs. Lab will send someone to obtain labs on this pt.

## 2020-10-04 NOTE — ED Notes (Signed)
Pt resting comfortably in bed, NAD, family at BS. No needs verbalized at this time. Bed low & locked; call light & personal items within reach. °

## 2020-10-04 NOTE — ED Notes (Signed)
Phlebotomist at BS for labs. °

## 2020-10-04 NOTE — ED Notes (Signed)
Pts daughter-in-law had to leave, but left her contact info & pts son's info.  Shetara Launer (daughter-in-law): [phone]  Saloma Cadena (son): [phone]

## 2020-10-04 NOTE — ED Notes (Signed)
Pt resting comfortably in bed, NAD, family at [facility]. No needs verbalized at this time. Bed low & locked, call light & personal items within reach.

## 2020-10-05 ENCOUNTER — Other Ambulatory Visit: Payer: Self-pay

## 2020-10-05 DIAGNOSIS — D518 Other vitamin B12 deficiency anemias: Secondary | ICD-10-CM | POA: Diagnosis not present

## 2020-10-05 DIAGNOSIS — E871 Hypo-osmolality and hyponatremia: Secondary | ICD-10-CM | POA: Diagnosis present

## 2020-10-05 DIAGNOSIS — N390 Urinary tract infection, site not specified: Secondary | ICD-10-CM | POA: Diagnosis present

## 2020-10-05 DIAGNOSIS — J1282 Pneumonia due to coronavirus disease 2019: Secondary | ICD-10-CM | POA: Diagnosis present

## 2020-10-05 DIAGNOSIS — G9341 Metabolic encephalopathy: Secondary | ICD-10-CM | POA: Diagnosis present

## 2020-10-05 DIAGNOSIS — E860 Dehydration: Secondary | ICD-10-CM | POA: Diagnosis present

## 2020-10-05 DIAGNOSIS — Z8249 Family history of ischemic heart disease and other diseases of the circulatory system: Secondary | ICD-10-CM | POA: Diagnosis not present

## 2020-10-05 DIAGNOSIS — Z79899 Other long term (current) drug therapy: Secondary | ICD-10-CM | POA: Diagnosis not present

## 2020-10-05 DIAGNOSIS — U071 COVID-19: Secondary | ICD-10-CM | POA: Diagnosis present

## 2020-10-05 DIAGNOSIS — I10 Essential (primary) hypertension: Secondary | ICD-10-CM | POA: Diagnosis not present

## 2020-10-05 DIAGNOSIS — D696 Thrombocytopenia, unspecified: Secondary | ICD-10-CM | POA: Diagnosis not present

## 2020-10-05 DIAGNOSIS — Z833 Family history of diabetes mellitus: Secondary | ICD-10-CM | POA: Diagnosis not present

## 2020-10-05 DIAGNOSIS — D703 Neutropenia due to infection: Secondary | ICD-10-CM | POA: Diagnosis not present

## 2020-10-05 DIAGNOSIS — R410 Disorientation, unspecified: Secondary | ICD-10-CM | POA: Diagnosis not present

## 2020-10-05 DIAGNOSIS — D519 Vitamin B12 deficiency anemia, unspecified: Secondary | ICD-10-CM | POA: Diagnosis present

## 2020-10-05 DIAGNOSIS — I129 Hypertensive chronic kidney disease with stage 1 through stage 4 chronic kidney disease, or unspecified chronic kidney disease: Secondary | ICD-10-CM | POA: Diagnosis present

## 2020-10-05 DIAGNOSIS — D72819 Decreased white blood cell count, unspecified: Secondary | ICD-10-CM | POA: Diagnosis not present

## 2020-10-05 DIAGNOSIS — R531 Weakness: Secondary | ICD-10-CM | POA: Diagnosis present

## 2020-10-05 DIAGNOSIS — Z5329 Procedure and treatment not carried out because of patient's decision for other reasons: Secondary | ICD-10-CM | POA: Diagnosis not present

## 2020-10-05 DIAGNOSIS — N1831 Chronic kidney disease, stage 3a: Secondary | ICD-10-CM | POA: Diagnosis present

## 2020-10-05 DIAGNOSIS — I878 Other specified disorders of veins: Secondary | ICD-10-CM | POA: Diagnosis present

## 2020-10-05 DIAGNOSIS — F0390 Unspecified dementia without behavioral disturbance: Secondary | ICD-10-CM | POA: Diagnosis present

## 2020-10-05 DIAGNOSIS — D6959 Other secondary thrombocytopenia: Secondary | ICD-10-CM | POA: Diagnosis present

## 2020-10-05 DIAGNOSIS — Z90710 Acquired absence of both cervix and uterus: Secondary | ICD-10-CM | POA: Diagnosis not present

## 2020-10-05 LAB — RESP PANEL BY RT-PCR (FLU A&B, COVID) ARPGX2
Influenza A by PCR: NEGATIVE
Influenza B by PCR: NEGATIVE
SARS Coronavirus 2 by RT PCR: POSITIVE — AB

## 2020-10-05 MED ORDER — ZINC SULFATE 220 (50 ZN) MG PO CAPS
220.0000 mg | ORAL_CAPSULE | Freq: Every day | ORAL | Status: DC
  Administered 2020-10-05 – 2020-10-13 (×9): 220 mg via ORAL
  Filled 2020-10-05 (×10): qty 1

## 2020-10-05 MED ORDER — METHYLPREDNISOLONE SODIUM SUCC 125 MG IJ SOLR
1.0000 mg/kg | Freq: Two times a day (BID) | INTRAMUSCULAR | Status: DC
  Administered 2020-10-05 – 2020-10-07 (×5): 83.125 mg via INTRAVENOUS
  Filled 2020-10-05 (×5): qty 2

## 2020-10-05 MED ORDER — SODIUM CHLORIDE 0.9 % IV SOLN: INTRAVENOUS | Status: DC

## 2020-10-05 MED ORDER — PREDNISONE 20 MG PO TABS
50.0000 mg | ORAL_TABLET | Freq: Every day | ORAL | Status: DC
  Administered 2020-10-08 – 2020-10-13 (×6): 50 mg via ORAL
  Filled 2020-10-05 (×6): qty 3

## 2020-10-05 MED ORDER — GUAIFENESIN-DM 100-10 MG/5ML PO SYRP: 10.0000 mL | ORAL_SOLUTION | ORAL | Status: DC | PRN

## 2020-10-05 MED ORDER — SODIUM CHLORIDE 0.9 % IV SOLN
200.0000 mg | Freq: Once | INTRAVENOUS | Status: AC
  Administered 2020-10-05: 200 mg via INTRAVENOUS
  Filled 2020-10-05: qty 40

## 2020-10-05 MED ORDER — TRAZODONE HCL 50 MG PO TABS
25.0000 mg | ORAL_TABLET | Freq: Every evening | ORAL | Status: DC | PRN
  Filled 2020-10-05: qty 1

## 2020-10-05 MED ORDER — MAGNESIUM HYDROXIDE 400 MG/5ML PO SUSP
30.0000 mL | Freq: Every day | ORAL | Status: DC | PRN
  Administered 2020-10-15: 30 mL via ORAL
  Filled 2020-10-05: qty 30

## 2020-10-05 MED ORDER — HYDROCOD POLST-CPM POLST ER 10-8 MG/5ML PO SUER
5.0000 mL | Freq: Two times a day (BID) | ORAL | Status: DC | PRN
  Administered 2020-10-09: 5 mL via ORAL
  Filled 2020-10-05: qty 5

## 2020-10-05 MED ORDER — TRAZODONE HCL 50 MG PO TABS: 25.0000 mg | ORAL_TABLET | Freq: Every evening | ORAL | Status: DC | PRN

## 2020-10-05 MED ORDER — ENOXAPARIN SODIUM 40 MG/0.4ML IJ SOSY
40.0000 mg | PREFILLED_SYRINGE | INTRAMUSCULAR | Status: DC
  Administered 2020-10-05 – 2020-10-17 (×13): 40 mg via SUBCUTANEOUS
  Filled 2020-10-05 (×15): qty 0.4

## 2020-10-05 MED ORDER — SODIUM CHLORIDE 0.9 % IV SOLN: 200.0000 mg | Freq: Once | INTRAVENOUS | Status: DC

## 2020-10-05 MED ORDER — FAMOTIDINE 20 MG PO TABS
20.0000 mg | ORAL_TABLET | Freq: Every day | ORAL | Status: DC
  Administered 2020-10-05 – 2020-10-18 (×14): 20 mg via ORAL
  Filled 2020-10-05 (×15): qty 1

## 2020-10-05 MED ORDER — ASCORBIC ACID 500 MG PO TABS
500.0000 mg | ORAL_TABLET | Freq: Every day | ORAL | Status: DC
  Administered 2020-10-05 – 2020-10-14 (×10): 500 mg via ORAL
  Filled 2020-10-05 (×10): qty 1

## 2020-10-05 MED ORDER — VITAMIN D 25 MCG (1000 UNIT) PO TABS
1000.0000 [IU] | ORAL_TABLET | Freq: Every day | ORAL | Status: DC
  Administered 2020-10-05 – 2020-10-14 (×10): 1000 [IU] via ORAL
  Filled 2020-10-05 (×10): qty 1

## 2020-10-05 MED ORDER — METHYLPREDNISOLONE SODIUM SUCC 125 MG IJ SOLR: 125.0000 mg | Freq: Once | INTRAMUSCULAR | Status: DC

## 2020-10-05 MED ORDER — SODIUM CHLORIDE 0.9 % IV SOLN
100.0000 mg | Freq: Every day | INTRAVENOUS | Status: AC
  Administered 2020-10-06 – 2020-10-09 (×4): 100 mg via INTRAVENOUS
  Filled 2020-10-05: qty 20
  Filled 2020-10-05: qty 100
  Filled 2020-10-05: qty 20
  Filled 2020-10-05: qty 100

## 2020-10-05 MED ORDER — ONDANSETRON HCL 4 MG/2ML IJ SOLN: 4.0000 mg | Freq: Four times a day (QID) | INTRAMUSCULAR | Status: DC | PRN

## 2020-10-05 MED ORDER — REMDESIVIR 100 MG IV SOLR: 100.0000 mg | Freq: Every day | INTRAVENOUS | Status: DC

## 2020-10-05 MED ORDER — VITAMIN B-12 1000 MCG PO TABS
1000.0000 ug | ORAL_TABLET | Freq: Every day | ORAL | Status: DC
  Administered 2020-10-05 – 2020-10-18 (×14): 1000 ug via ORAL
  Filled 2020-10-05 (×15): qty 1

## 2020-10-05 MED ORDER — GUAIFENESIN ER 600 MG PO TB12
600.0000 mg | ORAL_TABLET | Freq: Two times a day (BID) | ORAL | Status: DC
  Administered 2020-10-05 – 2020-10-13 (×16): 600 mg via ORAL
  Filled 2020-10-05 (×19): qty 1

## 2020-10-05 MED ORDER — ACETAMINOPHEN 325 MG PO TABS: 650.0000 mg | ORAL_TABLET | Freq: Four times a day (QID) | ORAL | Status: DC | PRN

## 2020-10-05 MED ORDER — ONDANSETRON HCL 4 MG PO TABS: 4.0000 mg | ORAL_TABLET | Freq: Four times a day (QID) | ORAL | Status: DC | PRN

## 2020-10-05 NOTE — ED Notes (Signed)
Lab called for venipuncture assist for am labs.  

## 2020-10-05 NOTE — H&P (Signed)
[place]   PATIENT NAME: [name] [name]    MR#:  [id number]  DATE OF BIRTH:  06-05-1926  DATE OF ADMISSION:  10/04/2020  PRIMARY CARE PHYSICIAN: Mick Sell, MD   Patient is coming from: Home  REQUESTING/REFERRING PHYSICIAN: Nita Sickle, MD  CHIEF COMPLAINT:   Chief Complaint  Patient presents with  . Weakness    Pt BIB EMS from home for generalized weakness. Family originally called EMS for possible stroke, but NIHSS is negative at this time. Pt does have generalized weakness. Pt is COVID +. Pt denies pain, SHOB, N/V.    HISTORY OF PRESENT ILLNESS:  [name] [name] is a 87 y.o. Caucasian female with medical history significant for CHF, hypertension, chronic venous stasis of both lower extremities and lymphedema, stage III chronic kidney disease and vitamin B12 deficiency anemia, coming with acute onset of generalized weakness and decreased responsiveness.  The patient had a positive outpatient test for COVID-19.  He has been weaker over the last couple of days.  He admits to mild dry cough as well as dyspnea.  His family called 911 yesterday and after evaluation by EMS he was not transferred to the hospital.  The family thought he was leaning to 1 side.  The patient however stated she was not sure about that.  The patient is a poor historian due to her dementia.  He denied any loss of taste or smell.  No nausea or vomiting or diarrhea.  No chest pain or palpitations.  No dysuria, oliguria, urinary frequency or urgency or flank pain.  ED Course: Upon presentation to the ER, blood pressure was 162/59 and later 213/66 and respiratory rate 25 with otherwise normal vital signs.  Labs revealed hyponatremia 132 and procalcitonin was less than 0.1.  CBC showed mild leukopenia.  Influenza antigens came back negative.  COVID-19 PCR came back positive.  UA was negative.  Imaging: Chest x-ray showed streaky perihilar and basilar opacities suggesting pneumonia possibly  atypical or viral.  The patient was given IV remdesivir, 500 mL bolus of normal saline and 125 mg of IV Solu-Medrol.  She will be admitted to a medical monitored bed for further evaluation and management. PAST MEDICAL HISTORY:   Past Medical History:  Diagnosis Date  . Cellulitis 10/28/2016   bilateral lower extremities.   CHF, hypertension, chronic venous stasis of both lower extremities, stage III chronic kidney disease, dementia and vitamin B12 deficiency anemia.  PAST SURGICAL HISTORY:   Past Surgical History:  Procedure Laterality Date  . ABDOMINAL HYSTERECTOMY    . GANGLION CYST EXCISION      SOCIAL HISTORY:   Social History   Tobacco Use  . Smoking status: Never  . Smokeless tobacco: Never  Substance Use Topics  . Alcohol use: No    FAMILY HISTORY:   Family History  Problem Relation Age of Onset  . Diabetes Mellitus II Mother   . Heart disease Father     DRUG ALLERGIES:  No Known Allergies  REVIEW OF SYSTEMS:   ROS As per history of present illness. All pertinent systems were reviewed above. Constitutional, HEENT, cardiovascular, respiratory, GI, GU, musculoskeletal, neuro, psychiatric, endocrine, integumentary and hematologic systems were reviewed and are otherwise negative/unremarkable except for positive findings mentioned above in the HPI.  Please note the patient is a fairly poor historian. MEDICATIONS AT HOME:   Prior to Admission medications   Medication Sig Start Date End Date Taking? Authorizing Provider  acetaminophen (TYLENOL) 325 MG tablet Take  2 tablets (650 mg total) every 6 (six) hours as needed by mouth for mild pain (or Fever >/= 101). 11/28/16   Ramonita Lab, MD  traZODone (DESYREL) 50 MG tablet Take 0.5 tablets (25 mg total) by mouth at bedtime as needed for sleep. 01/20/20   Alford Highland, MD  vitamin B-12 1000 MCG tablet Take 1 tablet (1,000 mcg total) by mouth daily. 01/21/20   Alford Highland, MD      VITAL SIGNS:  Blood  pressure (!) 109/52, pulse (!) 58, temperature 100.2 F (37.9 C), temperature source Oral, resp. rate 19, height 5\' 6"  (1.676 m), weight 83.1 kg, SpO2 96 %.  PHYSICAL EXAMINATION:  Physical Exam  GENERAL:  87 y.o.-year-old Caucasian female patient lying in the bed with no acute distress.  EYES: Pupils equal, round, reactive to light and accommodation. No scleral icterus. Extraocular muscles intact.  HEENT: Head atraumatic, normocephalic. Oropharynx and nasopharynx clear.  NECK:  Supple, no jugular venous distention. No thyroid enlargement, no tenderness.  LUNGS: Diminished bibasilar breath sounds with bibasal crackles.. No use of accessory muscles of respiration.  CARDIOVASCULAR: Regular rate and rhythm, S1, S2 normal. No murmurs, rubs, or gallops.  ABDOMEN: Soft, nondistended, nontender. Bowel sounds present. No organomegaly or mass.  EXTREMITIES: No pedal edema, cyanosis, or clubbing.  NEUROLOGIC: Cranial nerves II through XII are intact. Muscle strength 5/5 in all extremities. Sensation intact. Gait not checked.  PSYCHIATRIC: The patient is alert and oriented x 3.  Normal affect and good eye contact. SKIN: No obvious rash, lesion, or ulcer.   LABORATORY PANEL:   CBC Recent Labs  Lab 10/04/20 2043  WBC 3.8*  HGB 13.0  HCT 39.1  PLT 140*   ------------------------------------------------------------------------------------------------------------------  Chemistries  Recent Labs  Lab 10/04/20 2043  NA 132*  K 3.7  CL 100  CO2 24  GLUCOSE 122*  BUN 22  CREATININE 0.93  CALCIUM 8.5*   ------------------------------------------------------------------------------------------------------------------  Cardiac Enzymes No results for input(s): TROPONINI in the last 168 hours. ------------------------------------------------------------------------------------------------------------------  RADIOLOGY:  DG Chest Portable 1 View  Result Date: 10/04/2020 CLINICAL DATA:   Weakness EXAM: PORTABLE CHEST 1 VIEW COMPARISON:  04/06/2020 FINDINGS: Streaky perihilar and basilar opacity. No pleural effusion. Stable cardiomediastinal silhouette. No pneumothorax IMPRESSION: Streaky perihilar and basilar opacity suggestive of pneumonia, possible atypical or viral process. Electronically Signed   By: 04/08/2020 M.D.   On: 10/04/2020 22:36      IMPRESSION AND PLAN:  Active Problems:   Pneumonia due to COVID-19 virus  1.  Pneumonia due to COVID-19 with associated acute metabolic cephalopathy and generalized weakness. - The patient will be admitted to a med monitored bed. - We will continue him on IV remdesivir. - Mucolytic therapy will be provided. - She will be placed on vitamin D3, vitamin C, zinc sulfate and aspirin. - We will obtain and follow inflammatory markers. - Procalcitonin was less than 0.1 and therefore we will not add antibiotic therapy. - The patient will be continued on IV Solu-Medrol especially given his altered mental status.  2.  Mild hyponatremia. - The patient will be hydrated with IV normal saline and will follow BMP.  3.  Vitamin B12 deficiency. - We will continue vitamin B12.  DVT prophylaxis: Lovenox. Code Status: full code. Family Communication:  The plan of care was discussed in details with the patient (and family). I answered all questions. The patient agreed to proceed with the above mentioned plan. Further management will depend upon hospital course. Disposition Plan: Back to previous  home environment Consults called: none. All the records are reviewed and case discussed with ED provider.  Status is: Inpatient  Remains inpatient appropriate because:Ongoing diagnostic testing needed not appropriate for outpatient work up, Unsafe d/c plan, IV treatments appropriate due to intensity of illness or inability to take PO, and Inpatient level of care appropriate due to severity of illness  Dispo: The patient is from: Home               Anticipated d/c is to: Home              Patient currently is not medically stable to d/c.   Difficult to place patient No   TOTAL TIME TAKING CARE OF THIS PATIENT: 55 minutes.    Hannah Beat M.D on 10/05/2020 at 2:04 AM  [facility]   From 7 PM-7 AM, contact night-coverage [email or web address]  CC: Primary care physician; Mick Sell, MD

## 2020-10-05 NOTE — ED Notes (Signed)
Pt repositioned for comfort. Pt now resting comfortably in bed, NAD. No needs identified at this time. Bed low & locked; call light & personal items within reach. Lights dimmed for comfort/rest.

## 2020-10-05 NOTE — ED Notes (Signed)
Pt resting, resps unlabored. Call bell at left side.

## 2020-10-05 NOTE — Progress Notes (Addendum)
PROGRESS NOTE    [name] [name]  GOT:[id number] DOB: 05/22/1926 DOA: 10/04/2020 PCP: Mick Sell, MD   Chief complaint: Weakness and altered mental status. Brief Narrative:  [name] [name] is a 87 y.o. Caucasian female with medical history significant for CHF, hypertension, chronic venous stasis of both lower extremities and lymphedema, stage III chronic kidney disease and vitamin B12 deficiency anemia, coming with acute onset of generalized weakness and decreased responsiveness.  The patient had a positive outpatient test for COVID-19.  Chest x-ray showed bibasilar opacities suggesting atypical pneumonia.  Patient was treated with remdesivir and steroids.   Assessment & Plan:   Active Problems:   Pneumonia due to COVID-19 virus  COVID-19 pneumonia. Acute metabolic encephalopathy. Leukopenia secondary to COVID-19 pneumonia. Mild thrombocytopenia secondary to COVID. Patient mental status seems to be better today, continue remdesivir and IV steroids. Encourage p.o. intake, discontinue fluids. Follow CBC  Hyponatremia secondary to dehydration. Recheck BMP tomorrow  9/29. Responding to [employer]. CKD 3a.    DVT prophylaxis: Lovenox  Code Status: full Family Communication:  Disposition Plan:    Status is: Inpatient  Remains inpatient appropriate because:Altered mental status, IV treatments appropriate due to intensity of illness or inability to take PO, and Inpatient level of care appropriate due to severity of illness  Dispo: The patient is from: Home              Anticipated d/c is to: Home              Patient currently is not medically stable to d/c.   Difficult to place patient No        I/O last 3 completed shifts: In: 780.9 [IV Piggyback:780.9] Out: -  No intake/output data recorded.     Consultants:  None  Procedures: None  Antimicrobials: None  Subjective: Spoke with the nurse, patient was agitated last night.  But mental status seem to be  improving. She denies any short of breath or cough. She denies any abdominal pain or nausea vomiting. She feels very tired. No fever or chills.  Objective: Vitals:   10/05/20 0500 10/05/20 0530 10/05/20 0600 10/05/20 0630  BP: (!) 136/56 (!) 137/51 140/61 (!) 138/58  Pulse: (!) 50 (!) 46 (!) 55 (!) 49  Resp: 18 17 17 20   Temp:      TempSrc:      SpO2: 100% 97% 94% 97%  Weight:      Height:        Intake/Output Summary (Last 24 hours) at 10/05/2020 0935 Last data filed at 10/05/2020 0243 Gross per 24 hour  Intake 780.89 ml  Output --  Net 780.89 ml   Filed Weights   10/04/20 2003  Weight: 83.1 kg    Examination:  General exam: Appears calm and comfortable  Respiratory system: Clear to auscultation. Respiratory effort normal. Cardiovascular system: S1 & S2 heard, RRR. No JVD, murmurs, rubs, gallops or clicks. No pedal edema. Gastrointestinal system: Abdomen is nondistended, soft and nontender. No organomegaly or masses felt. Normal bowel sounds heard. Central nervous system: Alert and oriented x2. No focal neurological deficits. Extremities: Symmetric 5 x 5 power. Skin: No rashes, lesions or ulcers Psychiatry: Judgement and insight appear normal. Mood & affect appropriate.     Data Reviewed: I have personally reviewed following labs and imaging studies  CBC: Recent Labs  Lab 10/04/20 2043  WBC 3.8*  NEUTROABS 2.7  HGB 13.0  HCT 39.1  MCV 93.8  PLT 140*   Basic Metabolic Panel: Recent Labs  Lab 10/04/20 2043  NA 132*  K 3.7  CL 100  CO2 24  GLUCOSE 122*  BUN 22  CREATININE 0.93  CALCIUM 8.5*   GFR: Estimated Creatinine Clearance: 41.9 mL/min (by C-G formula based on SCr of 0.93 mg/dL). Liver Function Tests: No results for input(s): AST, ALT, ALKPHOS, BILITOT, PROT, ALBUMIN in the last 168 hours. No results for input(s): LIPASE, AMYLASE in the last 168 hours. No results for input(s): AMMONIA in the last 168 hours. Coagulation Profile: No results  for input(s): INR, PROTIME in the last 168 hours. Cardiac Enzymes: No results for input(s): CKTOTAL, CKMB, CKMBINDEX, TROPONINI in the last 168 hours. BNP (last 3 results) No results for input(s): PROBNP in the last 8760 hours. HbA1C: No results for input(s): HGBA1C in the last 72 hours. CBG: No results for input(s): GLUCAP in the last 168 hours. Lipid Profile: No results for input(s): CHOL, HDL, LDLCALC, TRIG, CHOLHDL, LDLDIRECT in the last 72 hours. Thyroid Function Tests: No results for input(s): TSH, T4TOTAL, FREET4, T3FREE, THYROIDAB in the last 72 hours. Anemia Panel: No results for input(s): VITAMINB12, FOLATE, FERRITIN, TIBC, IRON, RETICCTPCT in the last 72 hours. Sepsis Labs: Recent Labs  Lab 10/04/20 2039  PROCALCITON <0.10    Recent Results (from the past 240 hour(s))  Resp Panel by RT-PCR (Flu A&B, Covid) Nasopharyngeal Swab     Status: Abnormal   Collection Time: 10/04/20 10:47 PM   Specimen: Nasopharyngeal Swab; Nasopharyngeal(NP) swabs in vial transport medium  Result Value Ref Range Status   SARS Coronavirus 2 by RT PCR POSITIVE (A) NEGATIVE Final    Comment: RESULT CALLED TO, READ BACK BY AND VERIFIED WITH: PROVIDER @ 0115 10/05/20 LFD (NOTE) SARS-CoV-2 target nucleic acids are DETECTED.  The SARS-CoV-2 RNA is generally detectable in upper respiratory specimens during the acute phase of infection. Positive results are indicative of the presence of the identified virus, but do not rule out bacterial infection or co-infection with other pathogens not detected by the test. Clinical correlation with patient history and other diagnostic information is necessary to determine patient infection status. The expected result is Negative.  Fact Sheet for Patients: BloggerCourse.com  Fact Sheet for Healthcare Providers: SeriousBroker.it  This test is not yet approved or cleared by the [place] FDA and  has been  authorized for detection and/or diagnosis of SARS-CoV-2 by FDA under an Emergency Use Authorization (EUA).  This EUA will remain in effect (meaning this test can be used ) for the duration of  the COVID-19 declaration under Section 564(b)(1) of the Act, 21 U.S.C. section 360bbb-3(b)(1), unless the authorization is terminated or revoked sooner.     Influenza A by PCR NEGATIVE NEGATIVE Final   Influenza B by PCR NEGATIVE NEGATIVE Final    Comment: (NOTE) The Xpert Xpress SARS-CoV-2/FLU/RSV plus assay is intended as an aid in the diagnosis of influenza from Nasopharyngeal swab specimens and should not be used as a sole basis for treatment. Nasal washings and aspirates are unacceptable for Xpert Xpress SARS-CoV-2/FLU/RSV testing.  Fact Sheet for Patients: BloggerCourse.com  Fact Sheet for Healthcare Providers: SeriousBroker.it  This test is not yet approved or cleared by the [place] FDA and has been authorized for detection and/or diagnosis of SARS-CoV-2 by FDA under an Emergency Use Authorization (EUA). This EUA will remain in effect (meaning this test can be used) for the duration of the COVID-19 declaration under Section 564(b)(1) of the Act, 21 U.S.C. section 360bbb-3(b)(1), unless the authorization is terminated or revoked.  Performed  at [facility], [address], [place], [place] [address]          Radiology Studies: DG Chest Portable 1 View  Result Date: 10/04/2020 CLINICAL DATA:  Weakness EXAM: PORTABLE CHEST 1 VIEW COMPARISON:  04/06/2020 FINDINGS: Streaky perihilar and basilar opacity. No pleural effusion. Stable cardiomediastinal silhouette. No pneumothorax IMPRESSION: Streaky perihilar and basilar opacity suggestive of pneumonia, possible atypical or viral process. Electronically Signed   By: Jasmine Pang M.D.   On: 10/04/2020 22:36        Scheduled Meds:  vitamin C  500 mg Oral Daily    cholecalciferol  1,000 Units Oral Daily   enoxaparin (LOVENOX) injection  40 mg Subcutaneous Q24H   famotidine  20 mg Oral Daily   guaiFENesin  600 mg Oral BID   methylPREDNISolone (SOLU-MEDROL) injection  1 mg/kg Intravenous Q12H   Followed by   Melene Muller ON 10/08/2020] predniSONE  50 mg Oral Daily   cyanocobalamin  1,000 mcg Oral Daily   zinc sulfate  220 mg Oral Daily   Continuous Infusions:  sodium chloride 100 mL/hr at 10/05/20 0318   [START ON 10/06/2020] remdesivir 100 mg in NS 100 mL       LOS: 0 days    Time spent: No charge    Marrion Coy, MD [facility]   To contact the attending provider between 7A-7P or the covering provider during after hours 7P-7A, please log into the web site [email or web address] and access using universal [place] password for that web site. If you do not have the password, please call the hospital operator.  10/05/2020, 9:35 AM

## 2020-10-05 NOTE — ED Notes (Signed)
Pericare provided and purewick placed by Faith, NT

## 2020-10-05 NOTE — Progress Notes (Signed)
Remdesivir - Pharmacy Brief Note   O:  CXR: "Streaky perihilar and basilar opacity suggestive of pneumonia, possible atypical or viral process." SpO2: 94-100% on RA   A/P:  Remdesivir 200 mg IVPB once followed by 100 mg IVPB daily x 4 days.   Otelia Sergeant, PharmD, MBA 10/05/2020 1:20 AM

## 2020-10-05 NOTE — ED Notes (Addendum)
Pt cleansed of incontinent urine. Pt refusing to remove nightgown, even with small amount of urine on hem. Pt cussing at staff, states "bunch of sorry ass niggers, you call this a hospital, you and your sorry ass". Pt continues to cuss and use angry language. Pt offered po fluids and declines. Pt again shown how to use call bell if she has needs, pt does not verbalize understanding.

## 2020-10-05 NOTE — ED Notes (Signed)
Pt resting, eyes closed

## 2020-10-05 NOTE — Progress Notes (Signed)
Patient arrived to room in NAD. Upon assessment, patient refuses to respond to RN's orientation questions. States, "you should know what year it is since ya'll know everything else." Reasoning for questions explained to patient, patient continues to be argumentative with RN and states, "it's 1930 how about that?"  Will continue assessment efforts.

## 2020-10-06 DIAGNOSIS — D518 Other vitamin B12 deficiency anemias: Secondary | ICD-10-CM

## 2020-10-06 DIAGNOSIS — J1282 Pneumonia due to coronavirus disease 2019: Secondary | ICD-10-CM

## 2020-10-06 DIAGNOSIS — U071 COVID-19: Secondary | ICD-10-CM | POA: Diagnosis not present

## 2020-10-06 LAB — COMPREHENSIVE METABOLIC PANEL
ALT: 8 U/L (ref 0–44)
AST: 17 U/L (ref 15–41)
Albumin: 3.4 g/dL — ABNORMAL LOW (ref 3.5–5.0)
Alkaline Phosphatase: 50 U/L (ref 38–126)
Anion gap: 11 (ref 5–15)
BUN: 33 mg/dL — ABNORMAL HIGH (ref 8–23)
CO2: 23 mmol/L (ref 22–32)
Calcium: 8.9 mg/dL (ref 8.9–10.3)
Chloride: 102 mmol/L (ref 98–111)
Creatinine, Ser: 0.95 mg/dL (ref 0.44–1.00)
GFR, Estimated: 56 mL/min — ABNORMAL LOW (ref >60)
Glucose, Bld: 147 mg/dL — ABNORMAL HIGH (ref 70–99)
Potassium: 4.2 mmol/L (ref 3.5–5.1)
Sodium: 136 mmol/L (ref 135–145)
Total Bilirubin: 0.9 mg/dL (ref 0.3–1.2)
Total Protein: 7.1 g/dL (ref 6.5–8.1)

## 2020-10-06 LAB — CBC WITH DIFFERENTIAL/PLATELET
Abs Immature Granulocytes: 0.01 10*3/uL (ref 0.00–0.07)
Basophils Absolute: 0 10*3/uL (ref 0.0–0.1)
Basophils Relative: 0 %
Eosinophils Absolute: 0 10*3/uL (ref 0.0–0.5)
Eosinophils Relative: 0 %
HCT: 40.8 % (ref 36.0–46.0)
Hemoglobin: 13.3 g/dL (ref 12.0–15.0)
Immature Granulocytes: 0 %
Lymphocytes Relative: 19 %
Lymphs Abs: 0.6 10*3/uL — ABNORMAL LOW (ref 0.7–4.0)
MCH: 30.9 pg (ref 26.0–34.0)
MCHC: 32.6 g/dL (ref 30.0–36.0)
MCV: 94.7 fL (ref 80.0–100.0)
Monocytes Absolute: 0.1 10*3/uL (ref 0.1–1.0)
Monocytes Relative: 3 %
Neutro Abs: 2.6 10*3/uL (ref 1.7–7.7)
Neutrophils Relative %: 78 %
Platelets: 178 10*3/uL (ref 150–400)
RBC: 4.31 MIL/uL (ref 3.87–5.11)
RDW: 12.1 % (ref 11.5–15.5)
WBC: 3.3 10*3/uL — ABNORMAL LOW (ref 4.0–10.5)
nRBC: 0.9 % — ABNORMAL HIGH (ref 0.0–0.2)

## 2020-10-06 LAB — URINALYSIS, COMPLETE (UACMP) WITH MICROSCOPIC
Bilirubin Urine: NEGATIVE
Glucose, UA: NEGATIVE mg/dL
Hgb urine dipstick: NEGATIVE
Ketones, ur: NEGATIVE mg/dL
Leukocytes,Ua: NEGATIVE
Nitrite: NEGATIVE
Protein, ur: NEGATIVE mg/dL
Specific Gravity, Urine: 1.017 (ref 1.005–1.030)
pH: 5 (ref 5.0–8.0)

## 2020-10-06 LAB — FERRITIN: Ferritin: 300 ng/mL (ref 11–307)

## 2020-10-06 LAB — D-DIMER, QUANTITATIVE: D-Dimer, Quant: 0.75 ug/mL-FEU — ABNORMAL HIGH (ref 0.00–0.50)

## 2020-10-06 LAB — C-REACTIVE PROTEIN: CRP: 0.8 mg/dL (ref <1.0)

## 2020-10-06 MED ORDER — LORAZEPAM 2 MG/ML IJ SOLN: 0.5000 mg | Freq: Once | INTRAMUSCULAR | Status: DC

## 2020-10-06 MED ORDER — HALOPERIDOL LACTATE 5 MG/ML IJ SOLN
2.0000 mg | Freq: Four times a day (QID) | INTRAMUSCULAR | Status: DC | PRN
  Administered 2020-10-06: 2 mg via INTRAMUSCULAR
  Filled 2020-10-06: qty 1

## 2020-10-06 NOTE — Progress Notes (Signed)
PROGRESS NOTE    Krishawna Stiefel  KYH:[id number] DOB: [birth year] DOA: 10/04/2020 PCP: Mick Sell, MD   Chief complaint: Weakness and altered mental status. Brief Narrative:  Aasia Peavler is a 87 y.o. Caucasian female with medical history significant for CHF, hypertension, chronic venous stasis of both lower extremities and lymphedema, stage III chronic kidney disease and vitamin B12 deficiency anemia, coming with acute onset of generalized weakness and decreased responsiveness.  The patient had a positive outpatient test for COVID-19.  Chest x-ray showed bibasilar opacities suggesting atypical pneumonia.  Patient was treated with remdesivir and steroids.  9/27- confused, answers questions but confused.  Assessment & Plan:   Active Problems:   Anemia due to vitamin B12 deficiency   Pneumonia due to COVID-19 virus  COVID-19 pneumonia. Acute metabolic encephalopathy. Leukopenia secondary to COVID-19 pneumonia. Mild thrombocytopenia secondary to COVID. MS still confused. But does communicate. continue with remdesivir and IV steroids  Fluids were discontinued  Wbc lower mildly , continue to monitor.   Hyponatremia secondary to dehydration. Improved with hydration.     DVT prophylaxis: Lovenox  Code Status: full Family Communication: none at bedside Disposition Plan:    Status is: Inpatient  Remains inpatient appropriate because:Altered mental status, IV treatments appropriate due to intensity of illness or inability to take PO, and Inpatient level of care appropriate due to severity of illness  Dispo: The patient is from: Home              Anticipated d/c is to: Home              Patient currently is not medically stable to d/c.   Difficult to place patient No        I/O last 3 completed shifts: In: 780.9 [IV Piggyback:780.9] Out: 350 [Urine:350] No intake/output data recorded.     Consultants:  None  Procedures: None  Antimicrobials:  None  Subjective: No overnight issues. Denies worsening sob, cp, abd pain  Objective: Vitals:   10/05/20 1710 10/05/20 1800 10/05/20 1900 10/06/20 0502  BP:  (!) 155/61 (!) 155/57 (!) 148/54  Pulse: (!) 49 (!) 45 (!) 45 (!) 47  Resp: 19 18 18    Temp:  97.7 F (36.5 C) 97.7 F (36.5 C) 97.8 F (36.6 C)  TempSrc:  Oral Oral Oral  SpO2: 97% 97% 97% 98%  Weight:      Height:        Intake/Output Summary (Last 24 hours) at 10/06/2020 0856 Last data filed at 10/06/2020 0241 Gross per 24 hour  Intake --  Output 350 ml  Net -350 ml   Filed Weights   10/04/20 2003  Weight: 83.1 kg    Examination: Nad, calm Decrease bs at bases, no wheezing Regular S1-S2 no gallops Soft benign positive bowel sounds No edema Grossly intact Not oriented to place, time, person   Data Reviewed: I have personally reviewed following labs and imaging studies  CBC: Recent Labs  Lab 10/04/20 2043 10/06/20 0602  WBC 3.8* 3.3*  NEUTROABS 2.7 2.6  HGB 13.0 13.3  HCT 39.1 40.8  MCV 93.8 94.7  PLT 140* 178   Basic Metabolic Panel: Recent Labs  Lab 10/04/20 2043 10/06/20 0602  NA 132* 136  K 3.7 4.2  CL 100 102  CO2 24 23  GLUCOSE 122* 147*  BUN 22 33*  CREATININE 0.93 0.95  CALCIUM 8.5* 8.9   GFR: Estimated Creatinine Clearance: 41 mL/min (by C-G formula based on SCr of 0.95 mg/dL). Liver Function Tests:  Recent Labs  Lab 10/06/20 0602  AST 17  ALT 8  ALKPHOS 50  BILITOT 0.9  PROT 7.1  ALBUMIN 3.4*   No results for input(s): LIPASE, AMYLASE in the last 168 hours. No results for input(s): AMMONIA in the last 168 hours. Coagulation Profile: No results for input(s): INR, PROTIME in the last 168 hours. Cardiac Enzymes: No results for input(s): CKTOTAL, CKMB, CKMBINDEX, TROPONINI in the last 168 hours. BNP (last 3 results) No results for input(s): PROBNP in the last 8760 hours. HbA1C: No results for input(s): HGBA1C in the last 72 hours. CBG: No results for input(s):  GLUCAP in the last 168 hours. Lipid Profile: No results for input(s): CHOL, HDL, LDLCALC, TRIG, CHOLHDL, LDLDIRECT in the last 72 hours. Thyroid Function Tests: No results for input(s): TSH, T4TOTAL, FREET4, T3FREE, THYROIDAB in the last 72 hours. Anemia Panel: Recent Labs    10/06/20 0602  FERRITIN 300   Sepsis Labs: Recent Labs  Lab 10/04/20 2039  PROCALCITON <0.10    Recent Results (from the past 240 hour(s))  Resp Panel by RT-PCR (Flu A&B, Covid) Nasopharyngeal Swab     Status: Abnormal   Collection Time: 10/04/20 10:47 PM   Specimen: Nasopharyngeal Swab; Nasopharyngeal(NP) swabs in vial transport medium  Result Value Ref Range Status   SARS Coronavirus 2 by RT PCR POSITIVE (A) NEGATIVE Final    Comment: RESULT CALLED TO, READ BACK BY AND VERIFIED WITH: PROVIDER @ 0115 10/05/20 LFD (NOTE) SARS-CoV-2 target nucleic acids are DETECTED.  The SARS-CoV-2 RNA is generally detectable in upper respiratory specimens during the acute phase of infection. Positive results are indicative of the presence of the identified virus, but do not rule out bacterial infection or co-infection with other pathogens not detected by the test. Clinical correlation with patient history and other diagnostic information is necessary to determine patient infection status. The expected result is Negative.  Fact Sheet for Patients: BloggerCourse.com  Fact Sheet for Healthcare Providers: SeriousBroker.it  This test is not yet approved or cleared by the [place] FDA and  has been authorized for detection and/or diagnosis of SARS-CoV-2 by FDA under an Emergency Use Authorization (EUA).  This EUA will remain in effect (meaning this test can be used ) for the duration of  the COVID-19 declaration under Section 564(b)(1) of the Act, 21 U.S.C. section 360bbb-3(b)(1), unless the authorization is terminated or revoked sooner.     Influenza A by PCR  NEGATIVE NEGATIVE Final   Influenza B by PCR NEGATIVE NEGATIVE Final    Comment: (NOTE) The Xpert Xpress SARS-CoV-2/FLU/RSV plus assay is intended as an aid in the diagnosis of influenza from Nasopharyngeal swab specimens and should not be used as a sole basis for treatment. Nasal washings and aspirates are unacceptable for Xpert Xpress SARS-CoV-2/FLU/RSV testing.  Fact Sheet for Patients: BloggerCourse.com  Fact Sheet for Healthcare Providers: SeriousBroker.it  This test is not yet approved or cleared by the [place] FDA and has been authorized for detection and/or diagnosis of SARS-CoV-2 by FDA under an Emergency Use Authorization (EUA). This EUA will remain in effect (meaning this test can be used) for the duration of the COVID-19 declaration under Section 564(b)(1) of the Act, 21 U.S.C. section 360bbb-3(b)(1), unless the authorization is terminated or revoked.  Performed at [facility], [address], [place], [place] [address]          Radiology Studies: DG Chest Portable 1 View  Result Date: 10/04/2020 CLINICAL DATA:  Weakness EXAM: PORTABLE CHEST 1  VIEW COMPARISON:  04/06/2020 FINDINGS: Streaky perihilar and basilar opacity. No pleural effusion. Stable cardiomediastinal silhouette. No pneumothorax IMPRESSION: Streaky perihilar and basilar opacity suggestive of pneumonia, possible atypical or viral process. Electronically Signed   By: Jasmine Pang M.D.   On: 10/04/2020 22:36        Scheduled Meds:  vitamin C  500 mg Oral Daily   cholecalciferol  1,000 Units Oral Daily   enoxaparin (LOVENOX) injection  40 mg Subcutaneous Q24H   famotidine  20 mg Oral Daily   guaiFENesin  600 mg Oral BID   methylPREDNISolone (SOLU-MEDROL) injection  1 mg/kg Intravenous Q12H   Followed by   Melene Muller ON 10/08/2020] predniSONE  50 mg Oral Daily   cyanocobalamin  1,000 mcg Oral Daily   zinc sulfate  220 mg Oral Daily    Continuous Infusions:  remdesivir 100 mg in NS 100 mL       LOS: 1 day    Time spent: 35 minutes with more than 50% on COC    Lynn Ito, MD [facility]   To contact the attending provider between 7A-7P or the covering provider during after hours 7P-7A, please log into the web site [email or web address] and access using universal  password for that web site. If you do not have the password, please call the hospital operator.  10/06/2020, 8:56 AM

## 2020-10-06 NOTE — Progress Notes (Signed)
Unable to obtain vital signs at this time, patient is very agitated, yelling and trying to get out of the bed. MD notified. No new orders obtained at this time.

## 2020-10-06 NOTE — Plan of Care (Signed)
Patient is alert and oriented to self, and situation. Patient has been cooperative and calm. Patient reports no pain at this time. Vital signs are stable , patient is still on room air at this time. Will continue to monitor.  Problem: Coping: Goal: Psychosocial and spiritual needs will be supported 10/06/2020 0132 by Damita Lack, RN Outcome: Progressing 10/06/2020 0002 by Damita Lack, RN Outcome: Progressing   Problem: Respiratory: Goal: Will maintain a patent airway 10/06/2020 0132 by Damita Lack, RN Outcome: Progressing 10/06/2020 0002 by Damita Lack, RN Outcome: Progressing Goal: Complications related to the disease process, condition or treatment will be avoided or minimized 10/06/2020 0132 by Damita Lack, RN Outcome: Progressing 10/06/2020 0002 by Damita Lack, RN Outcome: Progressing

## 2020-10-07 DIAGNOSIS — D703 Neutropenia due to infection: Secondary | ICD-10-CM

## 2020-10-07 DIAGNOSIS — R410 Disorientation, unspecified: Secondary | ICD-10-CM | POA: Diagnosis not present

## 2020-10-07 DIAGNOSIS — D518 Other vitamin B12 deficiency anemias: Secondary | ICD-10-CM | POA: Diagnosis not present

## 2020-10-07 DIAGNOSIS — U071 COVID-19: Secondary | ICD-10-CM | POA: Diagnosis not present

## 2020-10-07 LAB — CBC WITH DIFFERENTIAL/PLATELET
Abs Immature Granulocytes: 0.04 10*3/uL (ref 0.00–0.07)
Basophils Absolute: 0 10*3/uL (ref 0.0–0.1)
Basophils Relative: 0 %
Eosinophils Absolute: 0 10*3/uL (ref 0.0–0.5)
Eosinophils Relative: 0 %
HCT: 38 % (ref 36.0–46.0)
Hemoglobin: 12.8 g/dL (ref 12.0–15.0)
Immature Granulocytes: 0 %
Lymphocytes Relative: 8 %
Lymphs Abs: 0.8 10*3/uL (ref 0.7–4.0)
MCH: 31.4 pg (ref 26.0–34.0)
MCHC: 33.7 g/dL (ref 30.0–36.0)
MCV: 93.4 fL (ref 80.0–100.0)
Monocytes Absolute: 0.3 10*3/uL (ref 0.1–1.0)
Monocytes Relative: 3 %
Neutro Abs: 7.9 10*3/uL — ABNORMAL HIGH (ref 1.7–7.7)
Neutrophils Relative %: 89 %
Platelets: 182 10*3/uL (ref 150–400)
RBC: 4.07 MIL/uL (ref 3.87–5.11)
RDW: 12 % (ref 11.5–15.5)
WBC: 9 10*3/uL (ref 4.0–10.5)
nRBC: 0 % (ref 0.0–0.2)

## 2020-10-07 LAB — FERRITIN: Ferritin: 284 ng/mL (ref 11–307)

## 2020-10-07 LAB — COMPREHENSIVE METABOLIC PANEL
ALT: 12 U/L (ref 0–44)
AST: 22 U/L (ref 15–41)
Albumin: 3.2 g/dL — ABNORMAL LOW (ref 3.5–5.0)
Alkaline Phosphatase: 47 U/L (ref 38–126)
Anion gap: 8 (ref 5–15)
BUN: 37 mg/dL — ABNORMAL HIGH (ref 8–23)
CO2: 25 mmol/L (ref 22–32)
Calcium: 9 mg/dL (ref 8.9–10.3)
Chloride: 105 mmol/L (ref 98–111)
Creatinine, Ser: 0.85 mg/dL (ref 0.44–1.00)
GFR, Estimated: >60 mL/min (ref >60)
Glucose, Bld: 124 mg/dL — ABNORMAL HIGH (ref 70–99)
Potassium: 4.1 mmol/L (ref 3.5–5.1)
Sodium: 138 mmol/L (ref 135–145)
Total Bilirubin: 0.7 mg/dL (ref 0.3–1.2)
Total Protein: 6.6 g/dL (ref 6.5–8.1)

## 2020-10-07 LAB — D-DIMER, QUANTITATIVE: D-Dimer, Quant: 0.75 ug/mL-FEU — ABNORMAL HIGH (ref 0.00–0.50)

## 2020-10-07 LAB — C-REACTIVE PROTEIN: CRP: 0.5 mg/dL (ref <1.0)

## 2020-10-07 NOTE — Plan of Care (Signed)
  Problem: Education: Goal: Knowledge of risk factors and measures for prevention of condition will improve Outcome: Not Progressing   Problem: Coping: Goal: Psychosocial and spiritual needs will be supported Outcome: Progressing   Problem: Respiratory: Goal: Will maintain a patent airway Outcome: Progressing Goal: Complications related to the disease process, condition or treatment will be avoided or minimized Outcome: Progressing   

## 2020-10-07 NOTE — Progress Notes (Signed)
PROGRESS NOTE    Kortlynn Poust  ZOX:[id number] DOB: 1926-09-12 DOA: 10/04/2020 PCP: Mick Sell, MD   Chief complaint: Weakness and altered mental status. Brief Narrative:  [name] [name] is a 87 y.o. Caucasian female with medical history significant for CHF, hypertension, chronic venous stasis of both lower extremities and lymphedema, stage III chronic kidney disease and vitamin B12 deficiency anemia, coming with acute onset of generalized weakness and decreased responsiveness.  The patient had a positive outpatient test for COVID-19.  Chest x-ray showed bibasilar opacities suggesting atypical pneumonia.  Patient was treated with remdesivir and steroids.  9/27- confused, answers questions but confused. 9/28- confused. Spoke to son, he reports at baseline on and off she is confused, but couldn't tell me if she is at her baseline today. He also has covid so has not seen her only spoke to her on the phone  Assessment & Plan:   Active Problems:   Anemia due to vitamin B12 deficiency   Pneumonia due to COVID-19 virus  COVID-19 pneumonia. Acute metabolic encephalopathy. Leukopenia secondary to COVID-19 pneumonia. Mild thrombocytopenia secondary to COVID. Patient appears to be confused but slightly has some dementia and per son she is confused on and off at baseline.  Not sure if she is at her baseline?. Will continue remdesivir and IV steroids WBC trended upward Fluids have been discontinued Ferritin trending down, CRP trending down On room air     Hyponatremia secondary to dehydration. Improved and stable with hydration   PT OT recommended SNF  DVT prophylaxis: Lovenox  Code Status: full Family Communication: Updated son Disposition Plan:    Status is: Inpatient  Remains inpatient appropriate because:Altered mental status, IV treatments appropriate due to intensity of illness or inability to take PO, and Inpatient level of care appropriate due to severity of  illness  Dispo: The patient is from: Home              Anticipated d/c is to: SNF              Patient currently is not medically stable to d/c.   Difficult to place patient No        I/O last 3 completed shifts: In: 18.3 [IV Piggyback:18.3] Out: 1450 [Urine:1450] Total I/O In: 250 [P.O.:250] Out: 0      Consultants:  None  Procedures: None  Antimicrobials: None  Subjective: Patient denies chest pain, dizziness or shortness of breath.  Objective: Vitals:   10/06/20 0502 10/06/20 0900 10/06/20 1612 10/07/20 0856  BP: (!) 148/54 (!) 136/48 (!) 177/64 (!) 160/65  Pulse: (!) 47 (!) 52 (!) 51 (!) 43  Resp:   18 20  Temp: 97.8 F (36.6 C) 97.6 F (36.4 C) 98.6 F (37 C) (!) 97.3 F (36.3 C)  TempSrc: Oral Oral Oral Oral  SpO2: 98% 95% 94% 97%  Weight:      Height:        Intake/Output Summary (Last 24 hours) at 10/07/2020 1605 Last data filed at 10/07/2020 1000 Gross per 24 hour  Intake 250 ml  Output 1100 ml  Net -850 ml   Filed Weights   10/04/20 2003  Weight: 83.1 kg    Examination: NAD, calm sitting in bed Decreased breath sounds at bases no wheezing Regular S1-S2 no gallops Soft benign positive bowel sounds No edema At baseline but interactive grossly intact  Data Reviewed: I have personally reviewed following labs and imaging studies  CBC: Recent Labs  Lab 10/04/20 2043 10/06/20 0602 10/07/20 0534  WBC 3.8* 3.3* 9.0  NEUTROABS 2.7 2.6 7.9*  HGB 13.0 13.3 12.8  HCT 39.1 40.8 38.0  MCV 93.8 94.7 93.4  PLT 140* 178 182   Basic Metabolic Panel: Recent Labs  Lab 10/04/20 2043 10/06/20 0602 10/07/20 0534  NA 132* 136 138  K 3.7 4.2 4.1  CL 100 102 105  CO2 24 23 25   GLUCOSE 122* 147* 124*  BUN 22 33* 37*  CREATININE 0.93 0.95 0.85  CALCIUM 8.5* 8.9 9.0   GFR: Estimated Creatinine Clearance: 45.9 mL/min (by C-G formula based on SCr of 0.85 mg/dL). Liver Function Tests: Recent Labs  Lab 10/06/20 0602 10/07/20 0534  AST 17  22  ALT 8 12  ALKPHOS 50 47  BILITOT 0.9 0.7  PROT 7.1 6.6  ALBUMIN 3.4* 3.2*   No results for input(s): LIPASE, AMYLASE in the last 168 hours. No results for input(s): AMMONIA in the last 168 hours. Coagulation Profile: No results for input(s): INR, PROTIME in the last 168 hours. Cardiac Enzymes: No results for input(s): CKTOTAL, CKMB, CKMBINDEX, TROPONINI in the last 168 hours. BNP (last 3 results) No results for input(s): PROBNP in the last 8760 hours. HbA1C: No results for input(s): HGBA1C in the last 72 hours. CBG: No results for input(s): GLUCAP in the last 168 hours. Lipid Profile: No results for input(s): CHOL, HDL, LDLCALC, TRIG, CHOLHDL, LDLDIRECT in the last 72 hours. Thyroid Function Tests: No results for input(s): TSH, T4TOTAL, FREET4, T3FREE, THYROIDAB in the last 72 hours. Anemia Panel: Recent Labs    10/06/20 0602 10/07/20 0534  FERRITIN 300 284   Sepsis Labs: Recent Labs  Lab 10/04/20 2039  PROCALCITON <0.10    Recent Results (from the past 240 hour(s))  Resp Panel by RT-PCR (Flu A&B, Covid) Nasopharyngeal Swab     Status: Abnormal   Collection Time: 10/04/20 10:47 PM   Specimen: Nasopharyngeal Swab; Nasopharyngeal(NP) swabs in vial transport medium  Result Value Ref Range Status   SARS Coronavirus 2 by RT PCR POSITIVE (A) NEGATIVE Final    Comment: RESULT CALLED TO, READ BACK BY AND VERIFIED WITH: PROVIDER @ 0115 10/05/20 LFD (NOTE) SARS-CoV-2 target nucleic acids are DETECTED.  The SARS-CoV-2 RNA is generally detectable in upper respiratory specimens during the acute phase of infection. Positive results are indicative of the presence of the identified virus, but do not rule out bacterial infection or co-infection with other pathogens not detected by the test. Clinical correlation with patient history and other diagnostic information is necessary to determine patient infection status. The expected result is Negative.  Fact Sheet for  Patients: 10/07/20  Fact Sheet for Healthcare Providers: BloggerCourse.com  This test is not yet approved or cleared by the [place] FDA and  has been authorized for detection and/or diagnosis of SARS-CoV-2 by FDA under an Emergency Use Authorization (EUA).  This EUA will remain in effect (meaning this test can be used ) for the duration of  the COVID-19 declaration under Section 564(b)(1) of the Act, 21 U.S.C. section 360bbb-3(b)(1), unless the authorization is terminated or revoked sooner.     Influenza A by PCR NEGATIVE NEGATIVE Final   Influenza B by PCR NEGATIVE NEGATIVE Final    Comment: (NOTE) The Xpert Xpress SARS-CoV-2/FLU/RSV plus assay is intended as an aid in the diagnosis of influenza from Nasopharyngeal swab specimens and should not be used as a sole basis for treatment. Nasal washings and aspirates are unacceptable for Xpert Xpress SARS-CoV-2/FLU/RSV testing.  Fact Sheet for Patients: [place]  Fact Sheet for Healthcare Providers: SeriousBroker.it  This test is not yet approved or cleared by the [place] FDA and has been authorized for detection and/or diagnosis of SARS-CoV-2 by FDA under an Emergency Use Authorization (EUA). This EUA will remain in effect (meaning this test can be used) for the duration of the COVID-19 declaration under Section 564(b)(1) of the Act, 21 U.S.C. section 360bbb-3(b)(1), unless the authorization is terminated or revoked.  Performed at [facility], [address], [place], [place] [address]          Radiology Studies: No results found.      Scheduled Meds:  vitamin C  500 mg Oral Daily   cholecalciferol  1,000 Units Oral Daily   enoxaparin (LOVENOX) injection  40 mg Subcutaneous Q24H   famotidine  20 mg Oral Daily   guaiFENesin  600 mg Oral BID   LORazepam  0.5 mg Intravenous Once    methylPREDNISolone (SOLU-MEDROL) injection  1 mg/kg Intravenous Q12H   Followed by   Melene Muller ON 10/08/2020] predniSONE  50 mg Oral Daily   cyanocobalamin  1,000 mcg Oral Daily   zinc sulfate  220 mg Oral Daily   Continuous Infusions:  remdesivir 100 mg in NS 100 mL 100 mg (10/07/20 1006)     LOS: 2 days    Time spent: 35 minutes with more than 50% on COC    Lynn Ito, MD [facility]   To contact the attending provider between 7A-7P or the covering provider during after hours 7P-7A, please log into the web site [email or web address] and access using universal [place] password for that web site. If you do not have the password, please call the hospital operator.  10/07/2020, 4:05 PM

## 2020-10-07 NOTE — Evaluation (Signed)
Occupational Therapy Evaluation Patient Details Name: [name] [name] MRN: [id number] DOB: [birth year] Today's Date: 10/07/2020   History of Present Illness 87 y.o. female with medical history significant for CHF, hypertension, chronic venous stasis of both lower extremities and lymphedema, stage III chronic kidney disease and vitamin B12 deficiency anemia, coming with acute onset of generalized weakness and decreased responsiveness. Pt admitted for pneumonia due to COVID-19 with associated acute metabolic cephalopathy and generalized weakness.   Clinical Impression   Pt seen for OT evaluation this date. Session coordinated with PT to address cognition/behavior during functional activity. Upon arrival to room, pt awake and sitting upright in bed finishing breakfast. Pt A&Ox1, pleasant and agreeable throughout. Pt is a questionable historian and son called to obtain PLOF. Per son, pt is able to perform transfers to/from wheelchair at baseline (sometimes independently and sometimes with a lot of assist). Pt's son reports that pt requires assist for all ADLs, however did not elaborate on level of assist required. Pt currently presents with impaired cognition and decreased strength, balance, and activity tolerance. Due to these functional impairments, pt requires MOD A for bed mobility, MAX A+2 for sit<>stand transfers and sit>stand LB dressing, and SUPERVISION/SET-UP for seated grooming tasks. Pt unable to perform stand pivot this date d/t current functional impairments. Pt would benefit from additional skilled OT services to maximize return to PLOF and minimize risk of future falls, injury, caregiver burden, and readmission. Upon discharge, recommend SNF.        Recommendations for follow up therapy are one component of a multi-disciplinary discharge planning process, led by the attending physician.  Recommendations may be updated based on patient status, additional functional criteria and insurance  authorization.   Follow Up Recommendations  SNF    Equipment Recommendations  None recommended by OT       Precautions / Restrictions Precautions Precautions: Fall Restrictions Weight Bearing Restrictions: No      Mobility Bed Mobility Overal bed mobility: Needs Assistance Bed Mobility: Supine to Sit;Sit to Supine;Rolling Rolling: Supervision   Supine to sit: Min assist;HOB elevated Sit to supine: Mod assist   General bed mobility comments: MIN A during supine>sit to scoot hips toward EOB. MOD A for LE management during sit>supine    Transfers Overall transfer level: Needs assistance Equipment used: Rolling walker (2 wheeled) Transfers: Sit to/from Stand Sit to Stand: Max assist;+2 physical assistance              Balance Overall balance assessment: Needs assistance   Sitting balance-Leahy Scale: Fair Sitting balance - Comments: Intermittent posterior lean during seated grooming however pt able to self correct Postural control: Posterior lean Standing balance support: Bilateral upper extremity supported;During functional activity Standing balance-Leahy Scale: Poor Standing balance comment: Requires MAX A+2 to maintain standing balance with UE support from RW                           ADL either performed or assessed with clinical judgement   ADL Overall ADL's : Needs assistance/impaired Eating/Feeding: Set up   Grooming: Wash/dry face;Wash/dry hands;Supervision/safety;Set up;Sitting Grooming Details (indicate cue type and reason): supervision in setting of intermittent posterior lean, with pt able to self-correct             Lower Body Dressing: Maximal assistance;+2 for physical assistance;Sit to/from stand Lower Body Dressing Details (indicate cue type and reason): to doff briefs  Pertinent Vitals/Pain Pain Assessment: No/denies pain        Extremity/Trunk Assessment Upper Extremity Assessment Upper  Extremity Assessment: Overall WFL for tasks assessed (grossly 4/5 in all movements)   Lower Extremity Assessment Lower Extremity Assessment: Defer to PT evaluation       Communication Communication Communication: No difficulties   Cognition Arousal/Alertness: Awake/alert Behavior During Therapy: WFL for tasks assessed/performed Overall Cognitive Status: History of cognitive impairments - at baseline                                 General Comments: Pt A&Ox1. Pleasant and agreeable throughout. Able to follow 1-step commands with increased time              Home Living Family/patient expects to be discharged to:: Private residence Living Arrangements: Alone Available Help at Discharge: Family Type of Home: House       Home Layout: One level     Bathroom Shower/Tub: Other (comment) (sponge bathing from bed with assist)         Home Equipment: Walker - 2 wheels;Wheelchair - [employer] - 4 wheels          Prior Functioning/Environment Level of Independence: Needs assistance  Gait / Transfers Assistance Needed: Pt reporting she ambulates with RW. OT called son who reports she transfers only in wheelchair (sometimes independently and sometimes with a lo of assist) and has not walked in 2 years. Son reports no falls within past 6 months ADL's / Homemaking Assistance Needed: Son reporting pt requires assist for grooming, bathing, and dressing.            OT Problem List: Decreased strength;Decreased activity tolerance;Impaired balance (sitting and/or standing);Decreased cognition         OT Goals(Current goals can be found in the care plan section) Acute Rehab OT Goals OT Goal Formulation: Patient unable to participate in goal setting Time For Goal Achievement: 10/21/20 ADL Goals Pt Will Perform Upper Body Bathing: with set-up;with supervision;sitting Pt Will Perform Lower Body Dressing: with mod assist;sit to/from stand Pt Will Transfer to  Toilet: with mod assist;stand pivot transfer;bedside commode  OT Frequency: Min 1X/week           Co-evaluation PT/OT/SLP Co-Evaluation/Treatment: Yes Reason for Co-Treatment: Necessary to address cognition/behavior during functional activity;For patient/therapist safety;To address functional/ADL transfers PT goals addressed during session: Mobility/safety with mobility OT goals addressed during session: ADL's and self-care      AM-PAC OT "6 Clicks" Daily Activity     Outcome Measure Help from another person eating meals?: None Help from another person taking care of personal grooming?: A Little Help from another person toileting, which includes using toliet, bedpan, or urinal?: A Lot Help from another person bathing (including washing, rinsing, drying)?: A Lot Help from another person to put on and taking off regular upper body clothing?: A Little Help from another person to put on and taking off regular lower body clothing?: A Lot 6 Click Score: 16   End of Session Equipment Utilized During Treatment: Gait belt;Rolling walker Nurse Communication: Mobility status  Activity Tolerance: Patient tolerated treatment well Patient left: in bed;with call bell/phone within reach;with bed alarm set  OT Visit Diagnosis: Unsteadiness on feet (R26.81);Muscle weakness (generalized) (M62.81)                Time: 2130-8657 OT Time Calculation (min): 35 min Charges:  OT General Charges $OT Visit: 1 Visit OT Evaluation $  OT Eval Moderate Complexity: 1 Mod OT Treatments $Self Care/Home Management : 8-22 mins  Matthew Folks, OTR/L [facility] [phone]

## 2020-10-07 NOTE — Plan of Care (Signed)
Patient is alert to self. Patient has been very combative , yelling and trying to get out of bed. MD notified haldol and ativan ordered. Patient is now resting bed alarm on.  No vitals or medications have been given at this time, patient refuses.  Will continue to monitor.  Problem: Respiratory: Goal: Will maintain a patent airway Outcome: Progressing Goal: Complications related to the disease process, condition or treatment will be avoided or minimized Outcome: Progressing   Problem: Respiratory: Goal: Complications related to the disease process, condition or treatment will be avoided or minimized Outcome: Progressing

## 2020-10-07 NOTE — Evaluation (Signed)
Physical Therapy Evaluation Patient Details Name: [name] [name] MRN: [id number] DOB: 1926/02/21 Today's Date: 10/07/2020  History of Present Illness  87 y.o. female with medical history significant for CHF, hypertension, chronic venous stasis of both lower extremities and lymphedema, stage III chronic kidney disease and vitamin B12 deficiency anemia, coming with acute onset of generalized weakness and decreased responsiveness. Pt admitted for pneumonia due to COVID-19 with associated acute metabolic cephalopathy and generalized weakness.  Clinical Impression  Pt received supine in bed, agreeable to PT/OT co-evaluation. Pt remained pleasant and followed all commands during session with increased processing time. Pt was oriented to self only, she did not recall her birthday. Pt lives with her son and has not ambulated in over 2 years. She is able to perform stand pivot transfers to the w/c per telephone conversation with son, sometimes independently and other times with "a lot of help."   Today pt presents with functional strength in BLE however significantly decreased passive and active ROM with bilateral knee extension with a hard end feel. This did impair pt ability to stand and balance in standing. Pt demo a posterior trunk lean in both sitting and standing. MAX Ax2 was required during STS; transfer to recliner was not attempted due to lack of safety. Pt was able to roll with SUP using bed rails. PT would like to focus on transfers to [facility] or recliner. Currently rec SNF for STR in order to decrease assist level back to 1 person to return to baseline functioning. Would benefit from skilled PT to address above deficits and promote optimal return to PLOF.      Recommendations for follow up therapy are one component of a multi-disciplinary discharge planning process, led by the attending physician.  Recommendations may be updated based on patient status, additional functional criteria and insurance  authorization.  Follow Up Recommendations SNF;Supervision for mobility/OOB    Equipment Recommendations  Other (comment) (TBD at next venue of care)    Recommendations for Other Services       Precautions / Restrictions Precautions Precautions: Fall Restrictions Weight Bearing Restrictions: No      Mobility  Bed Mobility Overal bed mobility: Needs Assistance Bed Mobility: Supine to Sit;Sit to Supine;Rolling Rolling: Supervision   Supine to sit: Min assist;HOB elevated Sit to supine: Mod assist   General bed mobility comments: Rolling: use of bed rails. MIN A during supine>sit to scoot hips toward EOB. MOD A for BLE management during sit>supine    Transfers Overall transfer level: Needs assistance Equipment used: Rolling walker (2 wheeled) Transfers: Sit to/from Stand Sit to Stand: Max assist;+2 physical assistance         General transfer comment: MAX Ax2 for maximal lift and steadying - pt attempted to assist however demo posterior lean throughout all mobility. Pt attempted lateral scoot at EOB with zero clearance of hips.  Ambulation/Gait             General Gait Details: deferred - pt does not ambulate at baseline (>2 years)  Information systems manager Rankin (Stroke Patients Only)       Balance Overall balance assessment: Needs assistance   Sitting balance-Leahy Scale: Fair Sitting balance - Comments: Fair static sitting balance. Intermittent posterior lean during seated MMT. Postural control: Posterior lean Standing balance support: Bilateral upper extremity supported;During functional activity Standing balance-Leahy Scale: Poor Standing balance comment: Requires MAX A+2 to maintain static standing balance with BUE  support from RW due to posterior lean                             Pertinent Vitals/Pain Pain Assessment: No/denies pain    Home Living Family/patient expects to be discharged to:: Private  residence Living Arrangements: Children Available Help at Discharge: Family Type of Home: House       Home Layout: One level Home Equipment: Environmental consultant - 2 wheels;Wheelchair - [employer] - 4 wheels      Prior Function Level of Independence: Needs assistance   Gait / Transfers Assistance Needed: Pt reporting she ambulates with RW. OT called son who reports she transfers to wheelchair (sometimes independently and sometimes with a lot of assist) and has not walked in 2 years. Son reports no falls within past 6 months  ADL's / Homemaking Assistance Needed: Son reporting pt requires assist for grooming, bathing, and dressing.        Hand Dominance        Extremity/Trunk Assessment   Upper Extremity Assessment Upper Extremity Assessment: Overall WFL for tasks assessed    Lower Extremity Assessment Lower Extremity Assessment: Overall WFL for tasks assessed;RLE deficits/detail;LLE deficits/detail (grossly 4 to 4+/5 MMT in BLE, RLE slightly stronger) RLE Deficits / Details: significantly decreased knee extension - active and passive LLE Deficits / Details: significantly decreased knee extension - active and passive    Cervical / Trunk Assessment Cervical / Trunk Assessment: Kyphotic  Communication   Communication: No difficulties  Cognition Arousal/Alertness: Awake/alert Behavior During Therapy: WFL for tasks assessed/performed Overall Cognitive Status: History of cognitive impairments - at baseline                                 General Comments: Pt A&Ox1. Pleasant and agreeable throughout. Able to follow 1-step commands with increased time      General Comments      Exercises     Assessment/Plan    PT Assessment Patient needs continued PT services  PT Problem List Decreased strength;Decreased cognition;Decreased range of motion;Decreased activity tolerance;Decreased safety awareness;Decreased balance;Decreased mobility       PT Treatment  Interventions DME instruction;Balance training;Neuromuscular re-education;Functional mobility training;Patient/family education;Therapeutic activities;Therapeutic exercise    PT Goals (Current goals can be found in the Care Plan section)  Acute Rehab PT Goals Patient Stated Goal: none stated PT Goal Formulation: Patient unable to participate in goal setting    Frequency Min 2X/week   Barriers to discharge Decreased caregiver support Pt son lives with her - pt is currently requiring +2 assist for STS. Transfer out of bed was not safe.    Co-evaluation PT/OT/SLP Co-Evaluation/Treatment: Yes Reason for Co-Treatment: Necessary to address cognition/behavior during functional activity;For patient/therapist safety;To address functional/ADL transfers PT goals addressed during session: Mobility/safety with mobility OT goals addressed during session: ADL's and self-care       AM-PAC PT "6 Clicks" Mobility  Outcome Measure Help needed turning from your back to your side while in a flat bed without using bedrails?: A Little Help needed moving from lying on your back to sitting on the side of a flat bed without using bedrails?: A Little Help needed moving to and from a bed to a chair (including a wheelchair)?: A Lot Help needed standing up from a chair using your arms (e.g., wheelchair or bedside chair)?: A Lot Help needed to walk in hospital room?: Total Help needed  climbing 3-5 steps with a railing? : Total 6 Click Score: 12    End of Session Equipment Utilized During Treatment: Gait belt Activity Tolerance: Patient tolerated treatment well Patient left: in bed;with call bell/phone within reach;with bed alarm set Nurse Communication: Mobility status PT Visit Diagnosis: Unsteadiness on feet (R26.81);Muscle weakness (generalized) (M62.81)    Time: 2878-6767 PT Time Calculation (min) (ACUTE ONLY): 35 min   Charges:   PT Evaluation $PT Eval Moderate Complexity: 1 Mod PT  Treatments $Therapeutic Activity: 8-22 mins       Basilia Jumbo PT, DPT 10/07/20 2:10 PM [phone]   Lavenia Atlas 10/07/2020, 1:57 PM

## 2020-10-08 DIAGNOSIS — D518 Other vitamin B12 deficiency anemias: Secondary | ICD-10-CM | POA: Diagnosis not present

## 2020-10-08 DIAGNOSIS — J1282 Pneumonia due to coronavirus disease 2019: Secondary | ICD-10-CM | POA: Diagnosis not present

## 2020-10-08 DIAGNOSIS — U071 COVID-19: Secondary | ICD-10-CM | POA: Diagnosis not present

## 2020-10-08 LAB — COMPREHENSIVE METABOLIC PANEL
ALT: 19 U/L (ref 0–44)
AST: 21 U/L (ref 15–41)
Albumin: 3.2 g/dL — ABNORMAL LOW (ref 3.5–5.0)
Alkaline Phosphatase: 45 U/L (ref 38–126)
Anion gap: 6 (ref 5–15)
BUN: 37 mg/dL — ABNORMAL HIGH (ref 8–23)
CO2: 26 mmol/L (ref 22–32)
Calcium: 8.8 mg/dL — ABNORMAL LOW (ref 8.9–10.3)
Chloride: 106 mmol/L (ref 98–111)
Creatinine, Ser: 1.02 mg/dL — ABNORMAL HIGH (ref 0.44–1.00)
GFR, Estimated: 52 mL/min — ABNORMAL LOW (ref >60)
Glucose, Bld: 138 mg/dL — ABNORMAL HIGH (ref 70–99)
Potassium: 4.2 mmol/L (ref 3.5–5.1)
Sodium: 138 mmol/L (ref 135–145)
Total Bilirubin: 0.8 mg/dL (ref 0.3–1.2)
Total Protein: 6.3 g/dL — ABNORMAL LOW (ref 6.5–8.1)

## 2020-10-08 LAB — CBC WITH DIFFERENTIAL/PLATELET
Abs Immature Granulocytes: 0.02 10*3/uL (ref 0.00–0.07)
Basophils Absolute: 0 10*3/uL (ref 0.0–0.1)
Basophils Relative: 0 %
Eosinophils Absolute: 0 10*3/uL (ref 0.0–0.5)
Eosinophils Relative: 0 %
HCT: 39 % (ref 36.0–46.0)
Hemoglobin: 12.7 g/dL (ref 12.0–15.0)
Immature Granulocytes: 0 %
Lymphocytes Relative: 11 %
Lymphs Abs: 0.7 10*3/uL (ref 0.7–4.0)
MCH: 31.1 pg (ref 26.0–34.0)
MCHC: 32.6 g/dL (ref 30.0–36.0)
MCV: 95.4 fL (ref 80.0–100.0)
Monocytes Absolute: 0.1 10*3/uL (ref 0.1–1.0)
Monocytes Relative: 1 %
Neutro Abs: 5.1 10*3/uL (ref 1.7–7.7)
Neutrophils Relative %: 88 %
Platelets: 189 10*3/uL (ref 150–400)
RBC: 4.09 MIL/uL (ref 3.87–5.11)
RDW: 12.5 % (ref 11.5–15.5)
WBC: 5.8 10*3/uL (ref 4.0–10.5)
nRBC: 0 % (ref 0.0–0.2)

## 2020-10-08 LAB — D-DIMER, QUANTITATIVE: D-Dimer, Quant: 1.36 ug/mL-FEU — ABNORMAL HIGH (ref 0.00–0.50)

## 2020-10-08 LAB — FERRITIN: Ferritin: 260 ng/mL (ref 11–307)

## 2020-10-08 LAB — C-REACTIVE PROTEIN: CRP: <0.5 mg/dL (ref <1.0)

## 2020-10-08 NOTE — TOC Initial Note (Signed)
Transition of Care [facility]) - Initial/Assessment Note    Patient Details  Name: Reka Wist MRN: [id number] Date of Birth: 04-24-1926  [facility]) CM/SW Contact:    Chapman Fitch, RN Phone Number: 10/08/2020, 2:11 PM  Clinical Narrative:                  Patient admitted from home with PNA related to covid.  Tested positive for covid 9/25.    Per chart review patient lives at home with famil PCP Sampson Goon Has Rw, rollator and manuel WC at home  PT has assessed patient and recommends SNF Called son to discuss. VM full, sent text message  It son in agreement to bed search patient has an existing PASRR [id number] A Expected Discharge Plan: Skilled Nursing Facility     Patient Goals and CMS Choice        Expected Discharge Plan and Services Expected Discharge Plan: Skilled Nursing Facility       Living arrangements for the past 2 months: Single Family Home                                      Prior Living Arrangements/Services Living arrangements for the past 2 months: Single Family Home Lives with:: Adult Children              Current home services: DME    Activities of Daily Living Home Assistive Devices/Equipment: Wheelchair ADL Screening (condition at time of admission) Patient's cognitive ability adequate to safely complete daily activities?: Yes Is the patient deaf or have difficulty hearing?: No Does the patient have difficulty seeing, even when wearing glasses/contacts?: No Does the patient have difficulty concentrating, remembering, or making decisions?: Yes Patient able to express need for assistance with ADLs?: Yes Does the patient have difficulty dressing or bathing?: No Independently performs ADLs?: No Communication: Independent Dressing (OT): Needs assistance Is this a change from baseline?: Pre-admission baseline Grooming: Independent Feeding: Independent Bathing: Needs assistance Is this a change from baseline?:  Pre-admission baseline Toileting: Needs assistance Is this a change from baseline?: Pre-admission baseline In/Out Bed: Needs assistance Is this a change from baseline?: Pre-admission baseline Walks in Home: Independent with device (comment) (wheelchair) Does the patient have difficulty walking or climbing stairs?: Yes Weakness of Legs: Both Weakness of Arms/Hands: None  Permission Sought/Granted                  Emotional Assessment       Orientation: : Oriented to Self, Oriented to Place      Admission diagnosis:  Weakness [R53.1] Pneumonia due to COVID-19 virus [U07.1, J12.82] Patient Active Problem List   Diagnosis Date Noted   Pneumonia due to COVID-19 virus 10/05/2020   Superficial bruising of arm, left, initial encounter    Acute CHF (congestive heart failure) (HCC) 04/06/2020   HTN (hypertension), malignant    Lymphedema    Fall    Stage 3b chronic kidney disease (HCC)    B12 deficiency    Anemia due to vitamin B12 deficiency    Acute metabolic encephalopathy    General weakness    Confusion    Acute lower UTI 01/07/2020   E. coli UTI 01/07/2020   Chronic venous stasis dermatitis of both lower extremities 01/07/2020   Cellulitis 03/06/2017   Left leg cellulitis 11/25/2016   AKI (acute kidney injury) (HCC) 11/05/2016   Decubitus ulcer of ischium, stage 2,  left [facility]) 11/05/2016   Troponin I above reference range 11/04/2016   PCP:  Mick Sell, MD Pharmacy:   [facility] [address] ([address]), [place] - [address] [place]) [place] [address] Phone: [phone] Fax: [phone]     Social Determinants of Health (SDOH) Interventions    Readmission Risk Interventions No flowsheet data found.

## 2020-10-08 NOTE — Progress Notes (Signed)
Physical Therapy Treatment Patient Details Name: [name] [name] MRN: [id number] DOB: 01/16/1926 Today's Date: 10/08/2020   History of Present Illness 87 y.o. female with medical history significant for CHF, hypertension, chronic venous stasis of both lower extremities and lymphedema, stage III chronic kidney disease and vitamin B12 deficiency anemia, coming with acute onset of generalized weakness and decreased responsiveness. Pt admitted for pneumonia due to COVID-19 with associated acute metabolic cephalopathy and generalized weakness.    PT Comments    Pt stated she feels better today.  To EOB with min guard and increased time but no physical assist.  Standing is attempted at EOB with max a x 1, bed elevated and feet blocked with RW.  She is unable to come to standing despite assist. She does remain sitting x 30 minutes to eat breakfast with set up assist but with ease and no LOB in sitting.  Returns to supine with mod a x 1 for LE's.   Recommendations for follow up therapy are one component of a multi-disciplinary discharge planning process, led by the attending physician.  Recommendations may be updated based on patient status, additional functional criteria and insurance authorization.  Follow Up Recommendations  SNF;Supervision for mobility/OOB     Equipment Recommendations  Other (comment)    Recommendations for Other Services       Precautions / Restrictions Precautions Precautions: Fall Restrictions Weight Bearing Restrictions: No     Mobility  Bed Mobility Overal bed mobility: Needs Assistance Bed Mobility: Supine to Sit;Sit to Supine     Supine to sit: Min guard Sit to supine: Mod assist   General bed mobility comments: needs assist to lift LE's back onto bed.    Transfers Overall transfer level: Needs assistance Equipment used: Rolling walker (2 wheeled) Transfers: Sit to/from Stand;Lateral/Scoot Transfers Sit to Stand: Total assist;From elevated surface         Lateral/Scoot Transfers: Min assist General transfer comment: unable to stand from elevated surface and +1 assist with feet blocked.  does well with lateral scooting up in bed with increased time.  Ambulation/Gait                 Stairs             Wheelchair Mobility    Modified Rankin (Stroke Patients Only)       Balance Overall balance assessment: Needs assistance Sitting-balance support: Feet supported Sitting balance-Leahy Scale: Good Sitting balance - Comments: sits x 30 minutes to eat meal with distant supervision   Standing balance support: Bilateral upper extremity supported Standing balance-Leahy Scale: Zero Standing balance comment: uanble to stand with +1 assist                            Cognition Arousal/Alertness: Awake/alert Behavior During Therapy: WFL for tasks assessed/performed Overall Cognitive Status: History of cognitive impairments - at baseline                                 General Comments: repeating questions and comments in conversaton frequently.  stated she has a 1 yo son.      Exercises Other Exercises Other Exercises: seated AROM and remained sitting for meal    General Comments        Pertinent Vitals/Pain Pain Assessment: No/denies pain    Home Living  Prior Function            PT Goals (current goals can now be found in the care plan section) Progress towards PT goals: Progressing toward goals    Frequency    Min 2X/week      PT Plan Current plan remains appropriate    Co-evaluation              AM-PAC PT "6 Clicks" Mobility   Outcome Measure  Help needed turning from your back to your side while in a flat bed without using bedrails?: A Little Help needed moving from lying on your back to sitting on the side of a flat bed without using bedrails?: A Little Help needed moving to and from a bed to a chair (including a wheelchair)?:  A Lot Help needed standing up from a chair using your arms (e.g., wheelchair or bedside chair)?: A Lot Help needed to walk in hospital room?: Total Help needed climbing 3-5 steps with a railing? : Total 6 Click Score: 12    End of Session Equipment Utilized During Treatment: Gait belt Activity Tolerance: Patient tolerated treatment well Patient left: in bed;with call bell/phone within reach;with bed alarm set Nurse Communication: Mobility status PT Visit Diagnosis: Unsteadiness on feet (R26.81);Muscle weakness (generalized) (M62.81)     Time: 1937-9024 PT Time Calculation (min) (ACUTE ONLY): 39 min  Charges:  $Therapeutic Exercise: 23-37 mins $Therapeutic Activity: 23-37 mins                    Danielle Dess, PTA 10/08/20, 10:11 AM

## 2020-10-08 NOTE — Progress Notes (Signed)
PROGRESS NOTE    [name] [name]  GUR:[id number] DOB: [birth year] DOA: 10/04/2020 PCP: Mick Sell, MD   Chief complaint: Weakness and altered mental status. Brief Narrative:  [name] [name] is a 87 y.o. Caucasian female with medical history significant for CHF, hypertension, chronic venous stasis of both lower extremities and lymphedema, stage III chronic kidney disease and vitamin B12 deficiency anemia, coming with acute onset of generalized weakness and decreased responsiveness.  The patient had a positive outpatient test for COVID-19.  Chest x-ray showed bibasilar opacities suggesting atypical pneumonia.  Patient was treated with remdesivir and steroids.  9/27- confused, answers questions but confused. 9/28- confused. Spoke to son, he reports at baseline on and off she is confused, but couldn't tell me if she is at her baseline today. He also has covid so has not seen her only spoke to her on the phone 9/29 no issues overnight  Assessment & Plan:   Active Problems:   Anemia due to vitamin B12 deficiency   Pneumonia due to COVID-19 virus  COVID-19 pneumonia. Acute metabolic encephalopathy. Leukopenia secondary to COVID-19 pneumonia. Mild thrombocytopenia secondary to COVID. Patient appears to be confused but slightly has some dementia and per son she is confused on and off at baseline.  Not sure if she is at her baseline?. Will continue remdesivir and IV steroids 9/29 D-dimer mildly elevated at 1.36, ferritin trending down.  CRP less than 0.5 Currently on room air    Hyponatremia secondary to dehydration. Improved and stable with hydration   PT OT -recommend SNF  DVT prophylaxis: Lovenox  Code Status: full Family Communication: None at bedside Disposition Plan:    Status is: Inpatient  Remains inpatient appropriate because:Altered mental status, IV treatments appropriate due to intensity of illness or inability to take PO, and Inpatient level of care  appropriate due to severity of illness  Dispo: The patient is from: Home              Anticipated d/c is to: SNF              Patient currently is not medically stable to d/c.   Difficult to place patient No        I/O last 3 completed shifts: In: 250 [P.O.:250] Out: 1400 [Urine:1400] No intake/output data recorded.     Consultants:  None  Procedures: None  Antimicrobials: None  Subjective: No shortness of breath, dizziness.  She has no complaints.  Objective: Vitals:   10/07/20 0856 10/07/20 2030 10/08/20 0530 10/08/20 0859  BP: (!) 160/65 134/71 (!) 153/66 (!) 174/63  Pulse: (!) 43 (!) 42 (!) 41 (!) 41  Resp: 20     Temp: (!) 97.3 F (36.3 C) 97.6 F (36.4 C) (!) 97.3 F (36.3 C) (!) 97.5 F (36.4 C)  TempSrc: Oral Oral Oral Oral  SpO2: 97%   99%  Weight:      Height:        Intake/Output Summary (Last 24 hours) at 10/08/2020 2831 Last data filed at 10/08/2020 0530 Gross per 24 hour  Intake 0 ml  Output 300 ml  Net -300 ml   Filed Weights   10/04/20 2003  Weight: 83.1 kg    Examination: NAD, calm CTA no wheeze  regular S1-S2 no gallops Soft benign No edema Awake and alert, oriented to place and person not to time  Data Reviewed: I have personally reviewed following labs and imaging studies  CBC: Recent Labs  Lab 10/04/20 2043 10/06/20 0602 10/07/20 0534 10/08/20  0508  WBC 3.8* 3.3* 9.0 5.8  NEUTROABS 2.7 2.6 7.9* 5.1  HGB 13.0 13.3 12.8 12.7  HCT 39.1 40.8 38.0 39.0  MCV 93.8 94.7 93.4 95.4  PLT 140* 178 182 189   Basic Metabolic Panel: Recent Labs  Lab 10/04/20 2043 10/06/20 0602 10/07/20 0534 10/08/20 0508  NA 132* 136 138 138  K 3.7 4.2 4.1 4.2  CL 100 102 105 106  CO2 24 23 25 26   GLUCOSE 122* 147* 124* 138*  BUN 22 33* 37* 37*  CREATININE 0.93 0.95 0.85 1.02*  CALCIUM 8.5* 8.9 9.0 8.8*   GFR: Estimated Creatinine Clearance: 38.2 mL/min (A) (by C-G formula based on SCr of 1.02 mg/dL (H)). Liver Function  Tests: Recent Labs  Lab 10/06/20 0602 10/07/20 0534 10/08/20 0508  AST 17 22 21   ALT 8 12 19   ALKPHOS 50 47 45  BILITOT 0.9 0.7 0.8  PROT 7.1 6.6 6.3*  ALBUMIN 3.4* 3.2* 3.2*   No results for input(s): LIPASE, AMYLASE in the last 168 hours. No results for input(s): AMMONIA in the last 168 hours. Coagulation Profile: No results for input(s): INR, PROTIME in the last 168 hours. Cardiac Enzymes: No results for input(s): CKTOTAL, CKMB, CKMBINDEX, TROPONINI in the last 168 hours. BNP (last 3 results) No results for input(s): PROBNP in the last 8760 hours. HbA1C: No results for input(s): HGBA1C in the last 72 hours. CBG: No results for input(s): GLUCAP in the last 168 hours. Lipid Profile: No results for input(s): CHOL, HDL, LDLCALC, TRIG, CHOLHDL, LDLDIRECT in the last 72 hours. Thyroid Function Tests: No results for input(s): TSH, T4TOTAL, FREET4, T3FREE, THYROIDAB in the last 72 hours. Anemia Panel: Recent Labs    10/07/20 0534 10/08/20 0508  FERRITIN 284 260   Sepsis Labs: Recent Labs  Lab 10/04/20 2039  PROCALCITON <0.10    Recent Results (from the past 240 hour(s))  Resp Panel by RT-PCR (Flu A&B, Covid) Nasopharyngeal Swab     Status: Abnormal   Collection Time: 10/04/20 10:47 PM   Specimen: Nasopharyngeal Swab; Nasopharyngeal(NP) swabs in vial transport medium  Result Value Ref Range Status   SARS Coronavirus 2 by RT PCR POSITIVE (A) NEGATIVE Final    Comment: RESULT CALLED TO, READ BACK BY AND VERIFIED WITH: PROVIDER @ 0115 10/05/20 LFD (NOTE) SARS-CoV-2 target nucleic acids are DETECTED.  The SARS-CoV-2 RNA is generally detectable in upper respiratory specimens during the acute phase of infection. Positive results are indicative of the presence of the identified virus, but do not rule out bacterial infection or co-infection with other pathogens not detected by the test. Clinical correlation with patient history and other diagnostic information is  necessary to determine patient infection status. The expected result is Negative.  Fact Sheet for Patients: 2040  Fact Sheet for Healthcare Providers: 10/06/20  This test is not yet approved or cleared by the 10/07/20 FDA and  has been authorized for detection and/or diagnosis of SARS-CoV-2 by FDA under an Emergency Use Authorization (EUA).  This EUA will remain in effect (meaning this test can be used ) for the duration of  the COVID-19 declaration under Section 564(b)(1) of the Act, 21 U.S.C. section 360bbb-3(b)(1), unless the authorization is terminated or revoked sooner.     Influenza A by PCR NEGATIVE NEGATIVE Final   Influenza B by PCR NEGATIVE NEGATIVE Final    Comment: (NOTE) The Xpert Xpress SARS-CoV-2/FLU/RSV plus assay is intended as an aid in the diagnosis of influenza from Nasopharyngeal swab specimens and  should not be used as a sole basis for treatment. Nasal washings and aspirates are unacceptable for Xpert Xpress SARS-CoV-2/FLU/RSV testing.  Fact Sheet for Patients: BloggerCourse.com  Fact Sheet for Healthcare Providers: SeriousBroker.it  This test is not yet approved or cleared by the [place] FDA and has been authorized for detection and/or diagnosis of SARS-CoV-2 by FDA under an Emergency Use Authorization (EUA). This EUA will remain in effect (meaning this test can be used) for the duration of the COVID-19 declaration under Section 564(b)(1) of the Act, 21 U.S.C. section 360bbb-3(b)(1), unless the authorization is terminated or revoked.  Performed at [facility], [address] [place], [place] [address]          Radiology Studies: No results found.      Scheduled Meds:  vitamin C  500 mg Oral Daily   cholecalciferol  1,000 Units Oral Daily   enoxaparin (LOVENOX) injection  40 mg Subcutaneous Q24H    famotidine  20 mg Oral Daily   guaiFENesin  600 mg Oral BID   predniSONE  50 mg Oral Daily   cyanocobalamin  1,000 mcg Oral Daily   zinc sulfate  220 mg Oral Daily   Continuous Infusions:  remdesivir 100 mg in NS 100 mL Stopped (10/07/20 1036)     LOS: 3 days    Time spent: 35 minutes with more than 50% on COC    Lynn Ito, MD [facility]   To contact the attending provider between 7A-7P or the covering provider during after hours 7P-7A, please log into the web site [email or web address] and access using universal [place] password for that web site. If you do not have the password, please call the hospital operator.  10/08/2020, 9:18 AM

## 2020-10-09 DIAGNOSIS — D518 Other vitamin B12 deficiency anemias: Secondary | ICD-10-CM | POA: Diagnosis not present

## 2020-10-09 DIAGNOSIS — U071 COVID-19: Secondary | ICD-10-CM | POA: Diagnosis not present

## 2020-10-09 DIAGNOSIS — E871 Hypo-osmolality and hyponatremia: Secondary | ICD-10-CM | POA: Diagnosis not present

## 2020-10-09 DIAGNOSIS — D696 Thrombocytopenia, unspecified: Secondary | ICD-10-CM

## 2020-10-09 DIAGNOSIS — I10 Essential (primary) hypertension: Secondary | ICD-10-CM

## 2020-10-09 LAB — C-REACTIVE PROTEIN: CRP: <0.5 mg/dL (ref <1.0)

## 2020-10-09 LAB — D-DIMER, QUANTITATIVE: D-Dimer, Quant: 0.93 ug/mL-FEU — ABNORMAL HIGH (ref 0.00–0.50)

## 2020-10-09 LAB — FERRITIN: Ferritin: 289 ng/mL (ref 11–307)

## 2020-10-09 MED ORDER — AMLODIPINE BESYLATE 5 MG PO TABS
2.5000 mg | ORAL_TABLET | Freq: Every day | ORAL | Status: DC
  Administered 2020-10-09 – 2020-10-10 (×2): 2.5 mg via ORAL
  Filled 2020-10-09 (×2): qty 1

## 2020-10-09 MED ORDER — TRAZODONE HCL 50 MG PO TABS
50.0000 mg | ORAL_TABLET | Freq: Every evening | ORAL | Status: DC | PRN
  Administered 2020-10-12 – 2020-10-18 (×2): 50 mg via ORAL
  Filled 2020-10-09 (×2): qty 1

## 2020-10-09 NOTE — TOC Progression Note (Addendum)
Transition of Care [facility]) - Progression Note    Patient Details  Name: [name] [name] MRN: [id number] Date of Birth: January 01, 1927  Transition of Care [facility]) CM/SW Contact  [name] Liner, LCSW Phone Number: 10/09/2020, 11:57 AM  Clinical Narrative:   Tried calling son to discuss SNF recommendation. Voicemail is full. Asked MD and RN to have him call me if they talk to him.  2:48 pm: Spoke to grandson, [name] [name]. He will ask his father, [name] [name], to call CSW when he talks to him next. Told him it's regarding PT recommendations.  Expected Discharge Plan: Skilled Nursing Facility    Expected Discharge Plan and Services Expected Discharge Plan: Skilled Nursing Facility       Living arrangements for the past 2 months: Single Family Home                                       Social Determinants of Health (SDOH) Interventions    Readmission Risk Interventions No flowsheet data found.

## 2020-10-09 NOTE — Progress Notes (Signed)
PROGRESS NOTE    [name] [name]  XTG:[id number] DOB: 08/14/1926 DOA: 10/04/2020 PCP: Mick Sell, MD   Chief complaint: Weakness and altered mental status. Brief Narrative:  [name] [name] is a 87 y.o. Caucasian female with medical history significant for CHF, hypertension, chronic venous stasis of both lower extremities and lymphedema, stage III chronic kidney disease and vitamin B12 deficiency anemia, coming with acute onset of generalized weakness and decreased responsiveness.  The patient had a positive outpatient test for COVID-19.  Chest x-ray showed bibasilar opacities suggesting atypical pneumonia.  Patient was treated with remdesivir and steroids.  9/27- confused, answers questions but confused. 9/28- confused. Spoke to son, he reports at baseline on and off she is confused, but couldn't tell me if she is at her baseline today. He also has covid so has not seen her only spoke to her on the phone 9/29 no issues overnight 9/30 no overnight issues.  Patient little sleepy this AM.  Assessment & Plan:   Active Problems:   Anemia due to vitamin B12 deficiency   Pneumonia due to COVID-19 virus  COVID-19 pneumonia. Acute metabolic encephalopathy. Leukopenia secondary to COVID-19 pneumonia. Mild thrombocytopenia secondary to COVID. Patient appears to be confused but slightly has some dementia and per son she is confused on and off at baseline.  Not sure if she is at her baseline?. Will continue remdesivir and IV steroids  D-dimer mildly elevated at 1.36, ferritin trending down.  CRP less than 0.5 9/30 currently on room 10-day quarantine for SNF    Hyponatremia secondary to dehydration. Improved and stable with hydration   HTN- bp elevated. Not on bp meds as outpatient. Will start on low dose amlodipine. 2.5mg  daily   PT OT -recommend SNF  DVT prophylaxis: Lovenox  Code Status: full Family Communication: None at bedside Disposition Plan:    Status is:  Inpatient  Remains inpatient appropriate because:Altered mental status, IV treatments appropriate due to intensity of illness or inability to take PO, and Inpatient level of care appropriate due to severity of illness  Dispo: The patient is from: Home              Anticipated d/c is to: SNF              Patient currently is not medically stable to d/c.   Difficult to place patient No        I/O last 3 completed shifts: In: 240 [P.O.:240] Out: 950 [Urine:950] No intake/output data recorded.     Consultants:  None  Procedures: None  Antimicrobials: None  Subjective: No shortness of breath, or chest pain no complaints  Objective: Vitals:   10/08/20 2110 10/09/20 0111 10/09/20 0116 10/09/20 0340  BP: (!) 156/75 (!) 185/67 (!) 174/67 (!) 185/67  Pulse: (!) 42 (!) 41 (!) 42 (!) 43  Resp: 20     Temp: 97.7 F (36.5 C) (!) 97.5 F (36.4 C)  97.7 F (36.5 C)  TempSrc: Oral Oral  Oral  SpO2: 95% 99%  99%  Weight:      Height:        Intake/Output Summary (Last 24 hours) at 10/09/2020 0902 Last data filed at 10/08/2020 2020 Gross per 24 hour  Intake 240 ml  Output 650 ml  Net -410 ml   Filed Weights   10/04/20 2003  Weight: 83.1 kg    Examination: NAD, calm CTA no wheeze Regular S1-S2 no gallops Soft benign positive bowel sounds No edema Grossly intact  Data Reviewed: I  have personally reviewed following labs and imaging studies  CBC: Recent Labs  Lab 10/04/20 2043 10/06/20 0602 10/07/20 0534 10/08/20 0508  WBC 3.8* 3.3* 9.0 5.8  NEUTROABS 2.7 2.6 7.9* 5.1  HGB 13.0 13.3 12.8 12.7  HCT 39.1 40.8 38.0 39.0  MCV 93.8 94.7 93.4 95.4  PLT 140* 178 182 189   Basic Metabolic Panel: Recent Labs  Lab 10/04/20 2043 10/06/20 0602 10/07/20 0534 10/08/20 0508  NA 132* 136 138 138  K 3.7 4.2 4.1 4.2  CL 100 102 105 106  CO2 24 23 25 26   GLUCOSE 122* 147* 124* 138*  BUN 22 33* 37* 37*  CREATININE 0.93 0.95 0.85 1.02*  CALCIUM 8.5* 8.9 9.0 8.8*    GFR: Estimated Creatinine Clearance: 38.2 mL/min (A) (by C-G formula based on SCr of 1.02 mg/dL (H)). Liver Function Tests: Recent Labs  Lab 10/06/20 0602 10/07/20 0534 10/08/20 0508  AST 17 22 21   ALT 8 12 19   ALKPHOS 50 47 45  BILITOT 0.9 0.7 0.8  PROT 7.1 6.6 6.3*  ALBUMIN 3.4* 3.2* 3.2*   No results for input(s): LIPASE, AMYLASE in the last 168 hours. No results for input(s): AMMONIA in the last 168 hours. Coagulation Profile: No results for input(s): INR, PROTIME in the last 168 hours. Cardiac Enzymes: No results for input(s): CKTOTAL, CKMB, CKMBINDEX, TROPONINI in the last 168 hours. BNP (last 3 results) No results for input(s): PROBNP in the last 8760 hours. HbA1C: No results for input(s): HGBA1C in the last 72 hours. CBG: No results for input(s): GLUCAP in the last 168 hours. Lipid Profile: No results for input(s): CHOL, HDL, LDLCALC, TRIG, CHOLHDL, LDLDIRECT in the last 72 hours. Thyroid Function Tests: No results for input(s): TSH, T4TOTAL, FREET4, T3FREE, THYROIDAB in the last 72 hours. Anemia Panel: Recent Labs    10/07/20 0534 10/08/20 0508  FERRITIN 284 260   Sepsis Labs: Recent Labs  Lab 10/04/20 2039  PROCALCITON <0.10    Recent Results (from the past 240 hour(s))  Resp Panel by RT-PCR (Flu A&B, Covid) Nasopharyngeal Swab     Status: Abnormal   Collection Time: 10/04/20 10:47 PM   Specimen: Nasopharyngeal Swab; Nasopharyngeal(NP) swabs in vial transport medium  Result Value Ref Range Status   SARS Coronavirus 2 by RT PCR POSITIVE (A) NEGATIVE Final    Comment: RESULT CALLED TO, READ BACK BY AND VERIFIED WITH: PROVIDER @ 0115 10/05/20 LFD (NOTE) SARS-CoV-2 target nucleic acids are DETECTED.  The SARS-CoV-2 RNA is generally detectable in upper respiratory specimens during the acute phase of infection. Positive results are indicative of the presence of the identified virus, but do not rule out bacterial infection or co-infection with other  pathogens not detected by the test. Clinical correlation with patient history and other diagnostic information is necessary to determine patient infection status. The expected result is Negative.  Fact Sheet for Patients: 2040  Fact Sheet for Healthcare Providers: 10/06/20  This test is not yet approved or cleared by the 10/07/20 FDA and  has been authorized for detection and/or diagnosis of SARS-CoV-2 by FDA under an Emergency Use Authorization (EUA).  This EUA will remain in effect (meaning this test can be used ) for the duration of  the COVID-19 declaration under Section 564(b)(1) of the Act, 21 U.S.C. section 360bbb-3(b)(1), unless the authorization is terminated or revoked sooner.     Influenza A by PCR NEGATIVE NEGATIVE Final   Influenza B by PCR NEGATIVE NEGATIVE Final    Comment: (NOTE)  The Xpert Xpress SARS-CoV-2/FLU/RSV plus assay is intended as an aid in the diagnosis of influenza from Nasopharyngeal swab specimens and should not be used as a sole basis for treatment. Nasal washings and aspirates are unacceptable for Xpert Xpress SARS-CoV-2/FLU/RSV testing.  Fact Sheet for Patients: BloggerCourse.com  Fact Sheet for Healthcare Providers: SeriousBroker.it  This test is not yet approved or cleared by the [place] FDA and has been authorized for detection and/or diagnosis of SARS-CoV-2 by FDA under an Emergency Use Authorization (EUA). This EUA will remain in effect (meaning this test can be used) for the duration of the COVID-19 declaration under Section 564(b)(1) of the Act, 21 U.S.C. section 360bbb-3(b)(1), unless the authorization is terminated or revoked.  Performed at [facility], [address] [place], [place] [address]          Radiology Studies: No results found.      Scheduled Meds:  vitamin C  500  mg Oral Daily   cholecalciferol  1,000 Units Oral Daily   enoxaparin (LOVENOX) injection  40 mg Subcutaneous Q24H   famotidine  20 mg Oral Daily   guaiFENesin  600 mg Oral BID   predniSONE  50 mg Oral Daily   cyanocobalamin  1,000 mcg Oral Daily   zinc sulfate  220 mg Oral Daily   Continuous Infusions:  remdesivir 100 mg in NS 100 mL 100 mg (10/08/20 0953)     LOS: 4 days    Time spent: 35 minutes with more than 50% on COC    Lynn Ito, MD [facility]   To contact the attending provider between 7A-7P or the covering provider during after hours 7P-7A, please log into the web site [email or web address] and access using universal [place] password for that web site. If you do not have the password, please call the hospital operator.  10/09/2020, 9:02 AM

## 2020-10-09 NOTE — Plan of Care (Signed)
  Problem: Education: Goal: Knowledge of risk factors and measures for prevention of condition will improve Outcome: Progressing   Problem: Coping: Goal: Psychosocial and spiritual needs will be supported Outcome: Progressing   Problem: Respiratory: Goal: Will maintain a patent airway Outcome: Progressing Goal: Complications related to the disease process, condition or treatment will be avoided or minimized Outcome: Progressing   

## 2020-10-09 NOTE — Progress Notes (Signed)
Occupational Therapy Treatment Patient Details Name: [name] [name] MRN: [id number] DOB: Mar 24, 1926 Today's Date: 10/09/2020   History of present illness 87 y.o. female with medical history significant for CHF, hypertension, chronic venous stasis of both lower extremities and lymphedema, stage III chronic kidney disease and vitamin B12 deficiency anemia, coming with acute onset of generalized weakness and decreased responsiveness. Pt admitted for pneumonia due to COVID-19 with associated acute metabolic cephalopathy and generalized weakness.   OT comments  Upon entering the room, pt supine in bed with no c/o pain and agreeable to OT intervention. Pt reports she has finished lunch and once tray removed therapist noticed pt had food all over her in bed and large episode of bowel incontinence she was unaware of. Pt rolling L <> R multiple times in both directions with min A for roll. Pt needing total A for hygiene for thoroughness. Pt donning clean hospital gown with min A and bed linens changed. RN arrives and changes dressing on buttocks and remains in room to give medications as therapist exits the room.    Recommendations for follow up therapy are one component of a multi-disciplinary discharge planning process, led by the attending physician.  Recommendations may be updated based on patient status, additional functional criteria and insurance authorization.    Follow Up Recommendations  SNF    Equipment Recommendations  None recommended by OT       Precautions / Restrictions Precautions Precautions: Fall       Mobility Bed Mobility Overal bed mobility: Needs Assistance Bed Mobility: Rolling Rolling: Min assist         General bed mobility comments: L <> R multiple times for hygiene                ADL either performed or assessed with clinical judgement   ADL Overall ADL's : Needs assistance/impaired                                       General ADL  Comments: Pt with large episode of bowel incontinence.     Vision Patient Visual Report: No change from baseline            Cognition Arousal/Alertness: Awake/alert Behavior During Therapy: WFL for tasks assessed/performed Overall Cognitive Status: History of cognitive impairments - at baseline                                 General Comments: Pt very quiet by pleasant throughout and following 1 step commands with increased time and min - mod multimodal cuing.                   Pertinent Vitals/ Pain       Pain Assessment: No/denies pain         Frequency  Min 1X/week        Progress Toward Goals  OT Goals(current goals can now be found in the care plan section)  Progress towards OT goals: Progressing toward goals  Acute Rehab OT Goals Patient Stated Goal: none stated OT Goal Formulation: Patient unable to participate in goal setting Time For Goal Achievement: 10/21/20  Plan Discharge plan remains appropriate;Frequency remains appropriate       AM-PAC OT "6 Clicks" Daily Activity     Outcome Measure   Help from another person eating meals?: A Little Help  from another person taking care of personal grooming?: A Little Help from another person toileting, which includes using toliet, bedpan, or urinal?: A Lot Help from another person bathing (including washing, rinsing, drying)?: A Lot Help from another person to put on and taking off regular upper body clothing?: A Little Help from another person to put on and taking off regular lower body clothing?: A Lot 6 Click Score: 15    End of Session    OT Visit Diagnosis: Unsteadiness on feet (R26.81);Muscle weakness (generalized) (M62.81)   Activity Tolerance Patient tolerated treatment well;Patient limited by fatigue   Patient Left in bed;with call bell/phone within reach;with bed alarm set;with nursing/sitter in room   Nurse Communication Mobility status        Time: 3716-9678 OT Time  Calculation (min): 39 min  Charges: OT General Charges $OT Visit: 1 Visit OT Treatments $Self Care/Home Management : 38-52 mins  Jackquline Denmark, MS, OTR/L , CBIS ascom [phone]  10/09/20, 2:52 PM

## 2020-10-10 DIAGNOSIS — I10 Essential (primary) hypertension: Secondary | ICD-10-CM | POA: Diagnosis not present

## 2020-10-10 DIAGNOSIS — U071 COVID-19: Secondary | ICD-10-CM | POA: Diagnosis not present

## 2020-10-10 DIAGNOSIS — R410 Disorientation, unspecified: Secondary | ICD-10-CM | POA: Diagnosis not present

## 2020-10-10 DIAGNOSIS — D518 Other vitamin B12 deficiency anemias: Secondary | ICD-10-CM | POA: Diagnosis not present

## 2020-10-10 LAB — FERRITIN: Ferritin: 233 ng/mL (ref 11–307)

## 2020-10-10 LAB — D-DIMER, QUANTITATIVE: D-Dimer, Quant: 0.64 ug/mL-FEU — ABNORMAL HIGH (ref 0.00–0.50)

## 2020-10-10 LAB — C-REACTIVE PROTEIN: CRP: 0.6 mg/dL (ref <1.0)

## 2020-10-10 MED ORDER — AMLODIPINE BESYLATE 5 MG PO TABS
2.5000 mg | ORAL_TABLET | Freq: Once | ORAL | Status: AC
  Administered 2020-10-10: 2.5 mg via ORAL
  Filled 2020-10-10: qty 1

## 2020-10-10 MED ORDER — AMLODIPINE BESYLATE 5 MG PO TABS
5.0000 mg | ORAL_TABLET | Freq: Every day | ORAL | Status: DC
  Administered 2020-10-11 – 2020-10-14 (×4): 5 mg via ORAL
  Filled 2020-10-10 (×4): qty 1

## 2020-10-10 NOTE — Progress Notes (Signed)
PROGRESS NOTE    [name] [name]  RDE:[id number] DOB: 10-14-1926 DOA: 10/04/2020 PCP: Mick Sell, MD   Chief complaint: Weakness and altered mental status. Brief Narrative:  [name] [name] is a 87 y.o. Caucasian female with medical history significant for CHF, hypertension, chronic venous stasis of both lower extremities and lymphedema, stage III chronic kidney disease and vitamin B12 deficiency anemia, coming with acute onset of generalized weakness and decreased responsiveness.  The patient had a positive outpatient test for COVID-19.  Chest x-ray showed bibasilar opacities suggesting atypical pneumonia.  Patient was treated with remdesivir and steroids.  9/27- confused, answers questions but confused. 9/28- confused. Spoke to son, he reports at baseline on and off she is confused, but couldn't tell me if she is at her baseline today. He also has covid so has not seen her only spoke to her on the phone 9/29 no issues overnight 9/30 no overnight issues.  Patient little sleepy this AM. 10/1 patient has no complaints.  This morning she tells me her breakfast is very good.   Assessment & Plan:   Active Problems:   Anemia due to vitamin B12 deficiency   Pneumonia due to COVID-19 virus  COVID-19 pneumonia. Acute metabolic encephalopathy. Leukopenia secondary to COVID-19 pneumonia. Mild thrombocytopenia secondary to COVID. Patient appears to be confused but slightly has some dementia and per son she is confused on and off at baseline.  Not sure if she is at her baseline?. Will continue remdesivir and IV steroids  D-dimer mildly elevated at 1.36, ferritin trending down.  CRP less 10 10/1- currently on RA On 10 day quarantine for SNF     Hyponatremia secondary to dehydration. Improved and stable with hydration   HTN- bp elevated. Not on bp meds as outpatient. Started on low-dose amlodipine however still elevated will increase to 5 mg daily   PT OT -recommend SNF  DVT  prophylaxis: Lovenox  Code Status: full Family Communication: None at bedside Disposition Plan:    Status is: Inpatient  Remains inpatient appropriate because: Unsafe discharge   dispo: The patient is from: Home              Anticipated d/c is to: SNF              Patient currently: Is medically stable   Difficult to place patient No        I/O last 3 completed shifts: In: 340 [P.O.:340] Out: 300 [Urine:300] No intake/output data recorded.     Consultants:  None  Procedures: None  Antimicrobials: None  Subjective: No shortness of breath, chest pain, or abdominal pain.  Objective: Vitals:   10/09/20 0900 10/09/20 1700 10/09/20 1955 10/10/20 0321  BP: (!) 174/41 (!) 162/57 119/60 (!) 174/68  Pulse: (!) 48 (!) 51 (!) 52 (!) 47  Resp: 16 14    Temp: 98.1 F (36.7 C) 98.2 F (36.8 C) 97.6 F (36.4 C) 97.8 F (36.6 C)  TempSrc: Oral Oral Oral Oral  SpO2: 99% 95% 97% 98%  Weight:      Height:        Intake/Output Summary (Last 24 hours) at 10/10/2020 0905 Last data filed at 10/10/2020 0323 Gross per 24 hour  Intake 100 ml  Output 300 ml  Net -200 ml   Filed Weights   10/04/20 2003  Weight: 83.1 kg    Examination: NAD, calm CTA no wheeze rales rhonchi's Regular S1-S2 no gallops Soft benign positive bowel sounds No edema Grossly intact   Data  Reviewed: I have personally reviewed following labs and imaging studies  CBC: Recent Labs  Lab 10/04/20 2043 10/06/20 0602 10/07/20 0534 10/08/20 0508  WBC 3.8* 3.3* 9.0 5.8  NEUTROABS 2.7 2.6 7.9* 5.1  HGB 13.0 13.3 12.8 12.7  HCT 39.1 40.8 38.0 39.0  MCV 93.8 94.7 93.4 95.4  PLT 140* 178 182 189   Basic Metabolic Panel: Recent Labs  Lab 10/04/20 2043 10/06/20 0602 10/07/20 0534 10/08/20 0508  NA 132* 136 138 138  K 3.7 4.2 4.1 4.2  CL 100 102 105 106  CO2 24 23 25 26   GLUCOSE 122* 147* 124* 138*  BUN 22 33* 37* 37*  CREATININE 0.93 0.95 0.85 1.02*  CALCIUM 8.5* 8.9 9.0 8.8*    GFR: Estimated Creatinine Clearance: 38.2 mL/min (A) (by C-G formula based on SCr of 1.02 mg/dL (H)). Liver Function Tests: Recent Labs  Lab 10/06/20 0602 10/07/20 0534 10/08/20 0508  AST 17 22 21   ALT 8 12 19   ALKPHOS 50 47 45  BILITOT 0.9 0.7 0.8  PROT 7.1 6.6 6.3*  ALBUMIN 3.4* 3.2* 3.2*   No results for input(s): LIPASE, AMYLASE in the last 168 hours. No results for input(s): AMMONIA in the last 168 hours. Coagulation Profile: No results for input(s): INR, PROTIME in the last 168 hours. Cardiac Enzymes: No results for input(s): CKTOTAL, CKMB, CKMBINDEX, TROPONINI in the last 168 hours. BNP (last 3 results) No results for input(s): PROBNP in the last 8760 hours. HbA1C: No results for input(s): HGBA1C in the last 72 hours. CBG: No results for input(s): GLUCAP in the last 168 hours. Lipid Profile: No results for input(s): CHOL, HDL, LDLCALC, TRIG, CHOLHDL, LDLDIRECT in the last 72 hours. Thyroid Function Tests: No results for input(s): TSH, T4TOTAL, FREET4, T3FREE, THYROIDAB in the last 72 hours. Anemia Panel: Recent Labs    10/09/20 0942 10/10/20 0435  FERRITIN 289 233   Sepsis Labs: Recent Labs  Lab 10/04/20 2039  PROCALCITON <0.10    Recent Results (from the past 240 hour(s))  Resp Panel by RT-PCR (Flu A&B, Covid) Nasopharyngeal Swab     Status: Abnormal   Collection Time: 10/04/20 10:47 PM   Specimen: Nasopharyngeal Swab; Nasopharyngeal(NP) swabs in vial transport medium  Result Value Ref Range Status   SARS Coronavirus 2 by RT PCR POSITIVE (A) NEGATIVE Final    Comment: RESULT CALLED TO, READ BACK BY AND VERIFIED WITH: PROVIDER @ 0115 10/05/20 LFD (NOTE) SARS-CoV-2 target nucleic acids are DETECTED.  The SARS-CoV-2 RNA is generally detectable in upper respiratory specimens during the acute phase of infection. Positive results are indicative of the presence of the identified virus, but do not rule out bacterial infection or co-infection with other  pathogens not detected by the test. Clinical correlation with patient history and other diagnostic information is necessary to determine patient infection status. The expected result is Negative.  Fact Sheet for Patients: 2040  Fact Sheet for Healthcare Providers: 10/06/20  This test is not yet approved or cleared by the 10/07/20 FDA and  has been authorized for detection and/or diagnosis of SARS-CoV-2 by FDA under an Emergency Use Authorization (EUA).  This EUA will remain in effect (meaning this test can be used ) for the duration of  the COVID-19 declaration under Section 564(b)(1) of the Act, 21 U.S.C. section 360bbb-3(b)(1), unless the authorization is terminated or revoked sooner.     Influenza A by PCR NEGATIVE NEGATIVE Final   Influenza B by PCR NEGATIVE NEGATIVE Final  Comment: (NOTE) The Xpert Xpress SARS-CoV-2/FLU/RSV plus assay is intended as an aid in the diagnosis of influenza from Nasopharyngeal swab specimens and should not be used as a sole basis for treatment. Nasal washings and aspirates are unacceptable for Xpert Xpress SARS-CoV-2/FLU/RSV testing.  Fact Sheet for Patients: BloggerCourse.com  Fact Sheet for Healthcare Providers: SeriousBroker.it  This test is not yet approved or cleared by the [place] FDA and has been authorized for detection and/or diagnosis of SARS-CoV-2 by FDA under an Emergency Use Authorization (EUA). This EUA will remain in effect (meaning this test can be used) for the duration of the COVID-19 declaration under Section 564(b)(1) of the Act, 21 U.S.C. section 360bbb-3(b)(1), unless the authorization is terminated or revoked.  Performed at [facility], [address], [place], [place] [address]          Radiology Studies: No results found.      Scheduled Meds:  amLODipine  2.5  mg Oral Daily   vitamin C  500 mg Oral Daily   cholecalciferol  1,000 Units Oral Daily   enoxaparin (LOVENOX) injection  40 mg Subcutaneous Q24H   famotidine  20 mg Oral Daily   guaiFENesin  600 mg Oral BID   predniSONE  50 mg Oral Daily   cyanocobalamin  1,000 mcg Oral Daily   zinc sulfate  220 mg Oral Daily   Continuous Infusions:     LOS: 5 days    Time spent: 35 minutes with more than 50% on COC    Lynn Ito, MD [facility]   To contact the attending provider between 7A-7P or the covering provider during after hours 7P-7A, please log into the web site [email or web address] and access using universal [place] password for that web site. If you do not have the password, please call the hospital operator.  10/10/2020, 9:05 AM

## 2020-10-10 NOTE — TOC Progression Note (Signed)
Transition of Care [facility]) - Progression Note    Patient Details  Name: [name] [name] MRN: [id number] Date of Birth: 06-09-1926  Transition of Care [facility]) CM/SW Contact  Maud Deed, LCSW Phone Number: 10/10/2020, 1:55 PM  Clinical Narrative:    CSW was able to get in touch with [name] pt's son and he is in agreement to pt going to SNF for rehab. He does not have a preference in facilities at this time.   Expected Discharge Plan: Skilled Nursing Facility    Expected Discharge Plan and Services Expected Discharge Plan: Skilled Nursing Facility       Living arrangements for the past 2 months: Single Family Home                                       Social Determinants of Health (SDOH) Interventions    Readmission Risk Interventions No flowsheet data found.

## 2020-10-11 DIAGNOSIS — U071 COVID-19: Secondary | ICD-10-CM | POA: Diagnosis not present

## 2020-10-11 DIAGNOSIS — D518 Other vitamin B12 deficiency anemias: Secondary | ICD-10-CM | POA: Diagnosis not present

## 2020-10-11 DIAGNOSIS — I10 Essential (primary) hypertension: Secondary | ICD-10-CM | POA: Diagnosis not present

## 2020-10-11 DIAGNOSIS — E871 Hypo-osmolality and hyponatremia: Secondary | ICD-10-CM | POA: Diagnosis not present

## 2020-10-11 LAB — GLUCOSE, CAPILLARY: Glucose-Capillary: 145 mg/dL — ABNORMAL HIGH (ref 70–99)

## 2020-10-11 NOTE — Progress Notes (Addendum)
PROGRESS NOTE    [name] [name]  FYB:[id number] DOB: Jan 24, 1926 DOA: 10/04/2020 PCP: Mick Sell, MD   Chief complaint: Weakness and altered mental status. Brief Narrative:  [name] [name] is a 87 y.o. Caucasian female with medical history significant for CHF, hypertension, chronic venous stasis of both lower extremities and lymphedema, stage III chronic kidney disease and vitamin B12 deficiency anemia, coming with acute onset of generalized weakness and decreased responsiveness.  The patient had a positive outpatient test for COVID-19.  Chest x-ray showed bibasilar opacities suggesting atypical pneumonia.  Patient was treated with remdesivir and steroids.  9/27- confused, answers questions but confused. 9/28- confused. Spoke to son, he reports at baseline on and off she is confused, but couldn't tell me if she is at her baseline today. He also has covid so has not seen her only spoke to her on the phone 9/29 no issues overnight 9/30 no overnight issues.  Patient little sleepy this AM. 10/1 patient has no complaints.  This morning she tells me her breakfast is very good. 10/2 pt had some breakfast. She denies sob, cp. Little confused this am.   Assessment & Plan:   Active Problems:   Anemia due to vitamin B12 deficiency   Pneumonia due to COVID-19 virus  COVID-19 pneumonia. Acute metabolic encephalopathy. Leukopenia secondary to COVID-19 pneumonia. Mild thrombocytopenia secondary to COVID. Patient appears to be confused but slightly has some dementia and per son she is confused on and off at baseline.  Not sure if she is at her baseline?. Will continue remdesivir and IV steroids  D-dimer mildly elevated at 1.36, ferritin trending down.  CRP less 10 10/2 on room air On 10-day quarantine for SNF      Hyponatremia secondary to dehydration. Improved and stable with hydration   HTN- Not on bp meds as outpatient. Continue amlodipine as BP is more stable      PT OT  -SNF pending  DVT prophylaxis: Lovenox  Code Status: full Family Communication: None at bedside Disposition Plan:    Status is: Inpatient  Remains inpatient appropriate because: Unsafe discharge   dispo: The patient is from: Home              Anticipated d/c is to: SNF              Patient currently: Is medically stable   Difficult to place patient No        I/O last 3 completed shifts: In: -  Out: 850 [Urine:850] No intake/output data recorded.     Consultants:  None  Procedures: None  Antimicrobials: None  Subjective: No abd pain, sob, cp   Objective: Vitals:   10/10/20 1523 10/10/20 2010 10/11/20 0507 10/11/20 0845  BP: 131/84 133/75 134/69 131/63  Pulse: (!) 52 (!) 56 (!) 58 (!) 48  Resp: 16 18 16 16   Temp: 97.7 F (36.5 C) 97.7 F (36.5 C) 97.8 F (36.6 C) 97.9 F (36.6 C)  TempSrc: Oral Oral Oral Oral  SpO2: 97% 97% 100%   Weight:      Height:        Intake/Output Summary (Last 24 hours) at 10/11/2020 0931 Last data filed at 10/11/2020 0330 Gross per 24 hour  Intake --  Output 550 ml  Net -550 ml   Filed Weights   10/04/20 2003  Weight: 83.1 kg    Examination: NAD, calm CTA no wheeze rales Regular S1-S2 no gallops Soft benign positive bowel sounds No edema Awake and oriented to person,  not to date or place this am   Data Reviewed: I have personally reviewed following labs and imaging studies  CBC: Recent Labs  Lab 10/04/20 2043 10/06/20 0602 10/07/20 0534 10/08/20 0508  WBC 3.8* 3.3* 9.0 5.8  NEUTROABS 2.7 2.6 7.9* 5.1  HGB 13.0 13.3 12.8 12.7  HCT 39.1 40.8 38.0 39.0  MCV 93.8 94.7 93.4 95.4  PLT 140* 178 182 189   Basic Metabolic Panel: Recent Labs  Lab 10/04/20 2043 10/06/20 0602 10/07/20 0534 10/08/20 0508  NA 132* 136 138 138  K 3.7 4.2 4.1 4.2  CL 100 102 105 106  CO2 24 23 25 26   GLUCOSE 122* 147* 124* 138*  BUN 22 33* 37* 37*  CREATININE 0.93 0.95 0.85 1.02*  CALCIUM 8.5* 8.9 9.0 8.8*    GFR: Estimated Creatinine Clearance: 38.2 mL/min (A) (by C-G formula based on SCr of 1.02 mg/dL (H)). Liver Function Tests: Recent Labs  Lab 10/06/20 0602 10/07/20 0534 10/08/20 0508  AST 17 22 21   ALT 8 12 19   ALKPHOS 50 47 45  BILITOT 0.9 0.7 0.8  PROT 7.1 6.6 6.3*  ALBUMIN 3.4* 3.2* 3.2*   No results for input(s): LIPASE, AMYLASE in the last 168 hours. No results for input(s): AMMONIA in the last 168 hours. Coagulation Profile: No results for input(s): INR, PROTIME in the last 168 hours. Cardiac Enzymes: No results for input(s): CKTOTAL, CKMB, CKMBINDEX, TROPONINI in the last 168 hours. BNP (last 3 results) No results for input(s): PROBNP in the last 8760 hours. HbA1C: No results for input(s): HGBA1C in the last 72 hours. CBG: No results for input(s): GLUCAP in the last 168 hours. Lipid Profile: No results for input(s): CHOL, HDL, LDLCALC, TRIG, CHOLHDL, LDLDIRECT in the last 72 hours. Thyroid Function Tests: No results for input(s): TSH, T4TOTAL, FREET4, T3FREE, THYROIDAB in the last 72 hours. Anemia Panel: Recent Labs    10/09/20 0942 10/10/20 0435  FERRITIN 289 233   Sepsis Labs: Recent Labs  Lab 10/04/20 2039  PROCALCITON <0.10    Recent Results (from the past 240 hour(s))  Resp Panel by RT-PCR (Flu A&B, Covid) Nasopharyngeal Swab     Status: Abnormal   Collection Time: 10/04/20 10:47 PM   Specimen: Nasopharyngeal Swab; Nasopharyngeal(NP) swabs in vial transport medium  Result Value Ref Range Status   SARS Coronavirus 2 by RT PCR POSITIVE (A) NEGATIVE Final    Comment: RESULT CALLED TO, READ BACK BY AND VERIFIED WITH: PROVIDER @ 0115 10/05/20 LFD (NOTE) SARS-CoV-2 target nucleic acids are DETECTED.  The SARS-CoV-2 RNA is generally detectable in upper respiratory specimens during the acute phase of infection. Positive results are indicative of the presence of the identified virus, but do not rule out bacterial infection or co-infection with other  pathogens not detected by the test. Clinical correlation with patient history and other diagnostic information is necessary to determine patient infection status. The expected result is Negative.  Fact Sheet for Patients: 2040  Fact Sheet for Healthcare Providers: 10/06/20  This test is not yet approved or cleared by the 10/07/20 FDA and  has been authorized for detection and/or diagnosis of SARS-CoV-2 by FDA under an Emergency Use Authorization (EUA).  This EUA will remain in effect (meaning this test can be used ) for the duration of  the COVID-19 declaration under Section 564(b)(1) of the Act, 21 U.S.C. section 360bbb-3(b)(1), unless the authorization is terminated or revoked sooner.     Influenza A by PCR NEGATIVE NEGATIVE Final  Influenza B by PCR NEGATIVE NEGATIVE Final    Comment: (NOTE) The Xpert Xpress SARS-CoV-2/FLU/RSV plus assay is intended as an aid in the diagnosis of influenza from Nasopharyngeal swab specimens and should not be used as a sole basis for treatment. Nasal washings and aspirates are unacceptable for Xpert Xpress SARS-CoV-2/FLU/RSV testing.  Fact Sheet for Patients: BloggerCourse.com  Fact Sheet for Healthcare Providers: SeriousBroker.it  This test is not yet approved or cleared by the [place] FDA and has been authorized for detection and/or diagnosis of SARS-CoV-2 by FDA under an Emergency Use Authorization (EUA). This EUA will remain in effect (meaning this test can be used) for the duration of the COVID-19 declaration under Section 564(b)(1) of the Act, 21 U.S.C. section 360bbb-3(b)(1), unless the authorization is terminated or revoked.  Performed at [facility], [address], [place], [place] [address]          Radiology Studies: No results found.      Scheduled Meds:  amLODipine  5  mg Oral Daily   vitamin C  500 mg Oral Daily   cholecalciferol  1,000 Units Oral Daily   enoxaparin (LOVENOX) injection  40 mg Subcutaneous Q24H   famotidine  20 mg Oral Daily   guaiFENesin  600 mg Oral BID   predniSONE  50 mg Oral Daily   cyanocobalamin  1,000 mcg Oral Daily   zinc sulfate  220 mg Oral Daily   Continuous Infusions:     LOS: 6 days    Time spent: 35 minutes with more than 50% on COC    Lynn Ito, MD [facility]   To contact the attending provider between 7A-7P or the covering provider during after hours 7P-7A, please log into the web site [email or web address] and access using universal [place] password for that web site. If you do not have the password, please call the hospital operator.  10/11/2020, 9:31 AM

## 2020-10-11 NOTE — Plan of Care (Signed)
No acute events during the night. NAD noted. VSS.  Problem: Education: Goal: Knowledge of risk factors and measures for prevention of condition will improve Outcome: Progressing   Problem: Coping: Goal: Psychosocial and spiritual needs will be supported Outcome: Progressing   Problem: Respiratory: Goal: Will maintain a patent airway Outcome: Progressing Goal: Complications related to the disease process, condition or treatment will be avoided or minimized Outcome: Progressing

## 2020-10-12 DIAGNOSIS — D518 Other vitamin B12 deficiency anemias: Secondary | ICD-10-CM | POA: Diagnosis not present

## 2020-10-12 DIAGNOSIS — U071 COVID-19: Secondary | ICD-10-CM | POA: Diagnosis not present

## 2020-10-12 DIAGNOSIS — J1282 Pneumonia due to coronavirus disease 2019: Secondary | ICD-10-CM | POA: Diagnosis not present

## 2020-10-12 NOTE — Progress Notes (Signed)
Physical Therapy Treatment Patient Details Name: [name] [name] MRN: [id number] DOB: 06-03-1926 Today's Date: 10/12/2020   History of Present Illness 87 y.o. female with medical history significant for CHF, hypertension, chronic venous stasis of both lower extremities and lymphedema, stage III chronic kidney disease and vitamin B12 deficiency anemia, coming with acute onset of generalized weakness and decreased responsiveness. Pt admitted for pneumonia due to COVID-19 with associated acute metabolic cephalopathy and generalized weakness.    PT Comments    Agrees to session.  To EOB with min a x 1 and increased time.  Steady in sitting.  She attempts to stand multiple times while EOB with RW.  Max a x 1.  She is able to clear hips on first few attempts but never stands fully upright and relies on writer to hold her up.  As she fatigues she is unable to clear hips.  Makes about 8 attempts in total.  She is assisted back to supine with mod a x 1 for LE's after extra time given in sitting.  Pt continues to require heavy assist to stand and is unsafe with +1 assist.  Dependant +2 assist for transfers to chair at this time.  Given high assist needs, she is appropriate for sit to stand type lift to transfer to chair with nursing or as she transitions to SNF for pt and staff safety.      Recommendations for follow up therapy are one component of a multi-disciplinary discharge planning process, led by the attending physician.  Recommendations may be updated based on patient status, additional functional criteria and insurance authorization.  Follow Up Recommendations  SNF     Equipment Recommendations  Other (comment)    Recommendations for Other Services       Precautions / Restrictions Precautions Precautions: Fall Restrictions Weight Bearing Restrictions: No     Mobility  Bed Mobility Overal bed mobility: Needs Assistance       Supine to sit: Min assist Sit to supine: Mod assist         Transfers Overall transfer level: Needs assistance Equipment used: Rolling walker (2 wheeled) Transfers: Sit to/from Stand;Lateral/Scoot Transfers Sit to Stand: Max assist        Lateral/Scoot Transfers: Min assist General transfer comment: multiple attempts to stand today with decreasing sucess as she fatigues  Ambulation/Gait             General Gait Details: deferred - pt does not ambulate at baseline (>2 years)   Social research officer, government Rank[employer] (Stroke Patients Only)       Balance Overall balance assessment: Needs assistance Sitting-balance support: Feet supported Sitting balance-Leahy Scale: Good Sitting balance - Comments: sits x 30 minutes to eat meal with distant supervision   Standing balance support: Bilateral upper extremity supported Standing balance-Leahy Scale: Zero Standing balance comment: uanble to stand with +1 assist                            Cognition Arousal/Alertness: Awake/alert Behavior During Therapy: WFL for tasks assessed/performed Overall Cognitive Status: History of cognitive impairments - at baseline                                        Exercises      General Comments  Pertinent Vitals/Pain Pain Assessment: No/denies pain    Home Living                      Prior Function            PT Goals (current goals can now be found in the care plan section) Acute Rehab PT Goals Patient Stated Goal: none stated Progress towards PT goals: Progressing toward goals    Frequency    Min 2X/week      PT Plan Current plan remains appropriate    Co-evaluation              AM-PAC PT "6 Clicks" Mobility   Outcome Measure  Help needed turning from your back to your side while in a flat bed without using bedrails?: A Little Help needed moving from lying on your back to sitting on the side of a flat bed without using bedrails?: A  Little Help needed moving to and from a bed to a chair (including a wheelchair)?: Total Help needed standing up from a chair using your arms (e.g., wheelchair or bedside chair)?: A Lot Help needed to walk in hospital room?: Total Help needed climbing 3-5 steps with a railing? : Total 6 Click Score: 11    End of Session Equipment Utilized During Treatment: Gait belt   Patient left: in bed;with call bell/phone within reach;with bed alarm set Nurse Communication: Mobility status PT Visit Diagnosis: Unsteadiness on feet (R26.81);Muscle weakness (generalized) (M62.81)     Time: 0205-0237 PT Time Calculation (min) (ACUTE ONLY): 32 min  Charges:  $Therapeutic Exercise: 8-22 mins $Therapeutic Activity: 8-22 mins                    Danielle Dess, PTA 10/12/20, 2:44 PM

## 2020-10-12 NOTE — [place] FL2 (Signed)
[place] MEDICAID FL2 LEVEL OF CARE SCREENING TOOL     IDENTIFICATION  Patient Name: [name] [name] Birthdate: 30-Apr-1926 Sex: female Admission Date (Current Location): 10/04/2020  [facility] and [place] Number:  Chiropodist and Address:  [facility], [address] [place], [place] [address]      Provider Number: [id number]  Attending Physician Name and Address:  Lynn Ito, MD  Relative Name and Phone Number:       Current Level of Care: Hospital Recommended Level of Care: Skilled Nursing Facility Prior Approval Number:    Date Approved/Denied:   PASRR Number: [id number]  Discharge Plan: SNF    Current Diagnoses: Patient Active Problem List   Diagnosis Date Noted   Pneumonia due to COVID-19 virus 10/05/2020   Superficial bruising of arm, left, initial encounter    Acute CHF (congestive heart failure) (HCC) 04/06/2020   HTN (hypertension), malignant    Lymphedema    Fall    Stage 3b chronic kidney disease (HCC)    B12 deficiency    Anemia due to vitamin B12 deficiency    Acute metabolic encephalopathy    General weakness    Confusion    Acute lower UTI 01/07/2020   E. coli UTI 01/07/2020   Chronic venous stasis dermatitis of both lower extremities 01/07/2020   Cellulitis 03/06/2017   Left leg cellulitis 11/25/2016   AKI (acute kidney injury) (HCC) 11/05/2016   Decubitus ulcer of ischium, stage 2, left (HCC) 11/05/2016   Troponin I above reference range 11/04/2016    Orientation RESPIRATION BLADDER Height & Weight     Self  Normal Incontinent, External catheter Weight: 183 lb 1.6 oz (83.1 kg) Height:  5\' 6"  (167.6 cm)  BEHAVIORAL SYMPTOMS/MOOD NEUROLOGICAL BOWEL NUTRITION STATUS   (None)  (None) Continent Diet (Heart healthy)  AMBULATORY STATUS COMMUNICATION OF NEEDS Skin   Extensive Assist Verbally Normal                       Personal Care Assistance Level of Assistance  Bathing, Feeding, Dressing Bathing  Assistance: Maximum assistance Feeding assistance: Limited assistance Dressing Assistance: Maximum assistance     Functional Limitations Info  Sight, Hearing, Speech Sight Info: Adequate Hearing Info: Adequate Speech Info: Adequate    SPECIAL CARE FACTORS FREQUENCY  PT (By licensed PT), OT (By licensed OT)     PT Frequency: 5 x week OT Frequency: 5 x week            Contractures Contractures Info: Not present    Additional Factors Info  Code Status, Allergies Code Status Info: Full code Allergies Info: NKDA           Current Medications (10/12/2020):  This is the current hospital active medication list Current Facility-Administered Medications  Medication Dose Route Frequency Provider Last Rate Last Admin   acetaminophen (TYLENOL) tablet 650 mg  650 mg Oral Q6H PRN Mansy, Jan A, MD       amLODipine (NORVASC) tablet 5 mg  5 mg Oral Daily Feb, MD   5 mg at 10/12/20 12/12/20   ascorbic acid (VITAMIN C) tablet 500 mg  500 mg Oral Daily Mansy, Jan A, MD   500 mg at 10/12/20 0816   chlorpheniramine-HYDROcodone (TUSSIONEX) 10-8 MG/5ML suspension 5 mL  5 mL Oral Q12H PRN Mansy, Jan A, MD   5 mL at 10/09/20 0350   cholecalciferol (VITAMIN D3) tablet 1,000 Units  1,000 Units Oral Daily Mansy, 10/11/20, MD  1,000 Units at 10/12/20 0818   enoxaparin (LOVENOX) injection 40 mg  40 mg Subcutaneous Q24H Mansy, Jan A, MD   40 mg at 10/12/20 0815   famotidine (PEPCID) tablet 20 mg  20 mg Oral Daily Mansy, Jan A, MD   20 mg at 10/12/20 0817   guaiFENesin (MUCINEX) 12 hr tablet 600 mg  600 mg Oral BID Mansy, Jan A, MD   600 mg at 10/12/20 0817   guaiFENesin-dextromethorphan (ROBITUSSIN DM) 100-10 MG/5ML syrup 10 mL  10 mL Oral Q4H PRN Mansy, Jan A, MD       magnesium hydroxide (MILK OF MAGNESIA) suspension 30 mL  30 mL Oral Daily PRN Mansy, Jan A, MD       ondansetron [facility]) tablet 4 mg  4 mg Oral Q6H PRN Mansy, Jan A, MD       Or   ondansetron [facility]) injection 4 mg  4 mg  Intravenous Q6H PRN Mansy, Jan A, MD       predniSONE (DELTASONE) tablet 50 mg  50 mg Oral Daily Mansy, Jan A, MD   50 mg at 10/12/20 0816   traZODone (DESYREL) tablet 50 mg  50 mg Oral QHS PRN Lynn Ito, MD       vitamin B-12 (CYANOCOBALAMIN) tablet 1,000 mcg  1,000 mcg Oral Daily Mansy, Jan A, MD   1,000 mcg at 10/12/20 0816   zinc sulfate capsule 220 mg  220 mg Oral Daily Mansy, Jan A, MD   220 mg at 10/12/20 1035     Discharge Medications: Please see discharge summary for a list of discharge medications.  Relevant Imaging Results:  Relevant Lab Results:   Additional Information SS#: [id number]. Tested positive for COVID on 9/25.  Margarito Liner, LCSW

## 2020-10-12 NOTE — Progress Notes (Signed)
PROGRESS NOTE    [name] [name]  HRC:[id number] DOB: 31-Aug-1926 DOA: 10/04/2020 PCP: Mick Sell, MD   Chief complaint: Weakness and altered mental status. Brief Narrative:  [name] [name] is a 87 y.o. Caucasian female with medical history significant for CHF, hypertension, chronic venous stasis of both lower extremities and lymphedema, stage III chronic kidney disease and vitamin B12 deficiency anemia, coming with acute onset of generalized weakness and decreased responsiveness.  The patient had a positive outpatient test for COVID-19.  Chest x-ray showed bibasilar opacities suggesting atypical pneumonia.  Patient was treated with remdesivir and steroids.  9/27- confused, answers questions but confused. 9/28- confused. Spoke to son, he reports at baseline on and off she is confused, but couldn't tell me if she is at her baseline today. He also has covid so has not seen her only spoke to her on the phone 9/29 no issues overnight 9/30 no overnight issues.  Patient little sleepy this AM. 10/1 patient has no complaints.  This morning she tells me her breakfast is very good. 10/2 pt had some breakfast. She denies sob, cp. Little confused this am. 10/3 she is at baseline MS this am. Tells me her breakfast is good.    Assessment & Plan:   Active Problems:   Anemia due to vitamin B12 deficiency   Pneumonia due to COVID-19 virus  COVID-19 pneumonia. Acute metabolic encephalopathy. Leukopenia secondary to COVID-19 pneumonia. Mild thrombocytopenia secondary to COVID. Patient appears to be confused but slightly has some dementia and per son she is confused on and off at baseline.  Not sure if she is at her baseline?. Will continue remdesivir and IV steroids  D-dimer mildly elevated at 1.36, ferritin trending down.  CRP less 10 10/3 on room air On 10-day quarantine for SNF    Hyponatremia secondary to dehydration. Improved and stable with hydration   HTN- Not on bp meds as  outpatient. Continue amlodipine     PT OT -SNF pending  DVT prophylaxis: Lovenox  Code Status: full Family Communication: None at bedside Disposition Plan:    Status is: Inpatient  Remains inpatient appropriate because: Unsafe discharge   dispo: The patient is from: Home              Anticipated d/c is to: SNF              Patient currently: Is medically stable   Difficult to place patient No        I/O last 3 completed shifts: In: 0  Out: 500 [Urine:500] No intake/output data recorded.     Consultants:  None  Procedures: None  Antimicrobials: None  Subjective: No shortness of breath, chest pain, dizziness or any other complaints  Objective: Vitals:   10/11/20 0845 10/11/20 1700 10/11/20 2136 10/12/20 0624  BP: 131/63 116/62 (!) 127/57 (!) 157/63  Pulse: (!) 48 73 (!) 53 77  Resp: 16 18 16 16   Temp: 97.9 F (36.6 C) 98.6 F (37 C) 97.8 F (36.6 C) 97.6 F (36.4 C)  TempSrc: Oral Oral Oral Oral  SpO2:      Weight:      Height:        Intake/Output Summary (Last 24 hours) at 10/12/2020 0806 Last data filed at 10/12/2020 0500 Gross per 24 hour  Intake 0 ml  Output 200 ml  Net -200 ml   Filed Weights   10/04/20 2003  Weight: 83.1 kg    Examination: NAD, calm Cta no r/r Regular, S1-S2 no gallops  Soft benign positive bowel sounds No edema Awake and alert, oriented to place, person not to time which is her baseline   Data Reviewed: I have personally reviewed following labs and imaging studies  CBC: Recent Labs  Lab 10/06/20 0602 10/07/20 0534 10/08/20 0508  WBC 3.3* 9.0 5.8  NEUTROABS 2.6 7.9* 5.1  HGB 13.3 12.8 12.7  HCT 40.8 38.0 39.0  MCV 94.7 93.4 95.4  PLT 178 182 189   Basic Metabolic Panel: Recent Labs  Lab 10/06/20 0602 10/07/20 0534 10/08/20 0508  NA 136 138 138  K 4.2 4.1 4.2  CL 102 105 106  CO2 23 25 26   GLUCOSE 147* 124* 138*  BUN 33* 37* 37*  CREATININE 0.95 0.85 1.02*  CALCIUM 8.9 9.0 8.8*    GFR: Estimated Creatinine Clearance: 38.2 mL/min (A) (by C-G formula based on SCr of 1.02 mg/dL (H)). Liver Function Tests: Recent Labs  Lab 10/06/20 0602 10/07/20 0534 10/08/20 0508  AST 17 22 21   ALT 8 12 19   ALKPHOS 50 47 45  BILITOT 0.9 0.7 0.8  PROT 7.1 6.6 6.3*  ALBUMIN 3.4* 3.2* 3.2*   No results for input(s): LIPASE, AMYLASE in the last 168 hours. No results for input(s): AMMONIA in the last 168 hours. Coagulation Profile: No results for input(s): INR, PROTIME in the last 168 hours. Cardiac Enzymes: No results for input(s): CKTOTAL, CKMB, CKMBINDEX, TROPONINI in the last 168 hours. BNP (last 3 results) No results for input(s): PROBNP in the last 8760 hours. HbA1C: No results for input(s): HGBA1C in the last 72 hours. CBG: Recent Labs  Lab 10/11/20 1616  GLUCAP 145*   Lipid Profile: No results for input(s): CHOL, HDL, LDLCALC, TRIG, CHOLHDL, LDLDIRECT in the last 72 hours. Thyroid Function Tests: No results for input(s): TSH, T4TOTAL, FREET4, T3FREE, THYROIDAB in the last 72 hours. Anemia Panel: Recent Labs    10/09/20 0942 10/10/20 0435  FERRITIN 289 233   Sepsis Labs: No results for input(s): PROCALCITON, LATICACIDVEN in the last 168 hours.   Recent Results (from the past 240 hour(s))  Resp Panel by RT-PCR (Flu A&B, Covid) Nasopharyngeal Swab     Status: Abnormal   Collection Time: 10/04/20 10:47 PM   Specimen: Nasopharyngeal Swab; Nasopharyngeal(NP) swabs in vial transport medium  Result Value Ref Range Status   SARS Coronavirus 2 by RT PCR POSITIVE (A) NEGATIVE Final    Comment: RESULT CALLED TO, READ BACK BY AND VERIFIED WITH: PROVIDER @ 0115 10/05/20 LFD (NOTE) SARS-CoV-2 target nucleic acids are DETECTED.  The SARS-CoV-2 RNA is generally detectable in upper respiratory specimens during the acute phase of infection. Positive results are indicative of the presence of the identified virus, but do not rule out bacterial infection or  co-infection with other pathogens not detected by the test. Clinical correlation with patient history and other diagnostic information is necessary to determine patient infection status. The expected result is Negative.  Fact Sheet for Patients: 12/10/20  Fact Sheet for Healthcare Providers: 10/06/20  This test is not yet approved or cleared by the 10/07/20 FDA and  has been authorized for detection and/or diagnosis of SARS-CoV-2 by FDA under an Emergency Use Authorization (EUA).  This EUA will remain in effect (meaning this test can be used ) for the duration of  the COVID-19 declaration under Section 564(b)(1) of the Act, 21 U.S.C. section 360bbb-3(b)(1), unless the authorization is terminated or revoked sooner.     Influenza A by PCR NEGATIVE NEGATIVE Final   Influenza B  by PCR NEGATIVE NEGATIVE Final    Comment: (NOTE) The Xpert Xpress SARS-CoV-2/FLU/RSV plus assay is intended as an aid in the diagnosis of influenza from Nasopharyngeal swab specimens and should not be used as a sole basis for treatment. Nasal washings and aspirates are unacceptable for Xpert Xpress SARS-CoV-2/FLU/RSV testing.  Fact Sheet for Patients: BloggerCourse.com  Fact Sheet for Healthcare Providers: SeriousBroker.it  This test is not yet approved or cleared by the [place] FDA and has been authorized for detection and/or diagnosis of SARS-CoV-2 by FDA under an Emergency Use Authorization (EUA). This EUA will remain in effect (meaning this test can be used) for the duration of the COVID-19 declaration under Section 564(b)(1) of the Act, 21 U.S.C. section 360bbb-3(b)(1), unless the authorization is terminated or revoked.  Performed at [facility], [address], [place], [place] [address]          Radiology Studies: No results found.      Scheduled  Meds:  amLODipine  5 mg Oral Daily   vitamin C  500 mg Oral Daily   cholecalciferol  1,000 Units Oral Daily   enoxaparin (LOVENOX) injection  40 mg Subcutaneous Q24H   famotidine  20 mg Oral Daily   guaiFENesin  600 mg Oral BID   predniSONE  50 mg Oral Daily   cyanocobalamin  1,000 mcg Oral Daily   zinc sulfate  220 mg Oral Daily   Continuous Infusions:     LOS: 7 days    Time spent: 35 minutes with more than 50% on COC    Lynn Ito, MD [facility]   To contact the attending provider between 7A-7P or the covering provider during after hours 7P-7A, please log into the web site [email or web address] and access using [place] password for that web site. If you do not have the password, please call the hospital operator.  10/12/2020, 8:06 AM

## 2020-10-12 NOTE — Plan of Care (Signed)
  Problem: Education: Goal: Knowledge of risk factors and measures for prevention of condition will improve 10/12/2020 0016 by Earnestine Mealing, RN Outcome: Not Progressing 10/12/2020 0016 by Earnestine Mealing, RN Outcome: Progressing   Problem: Coping: Goal: Psychosocial and spiritual needs will be supported 10/12/2020 0016 by Earnestine Mealing, RN Outcome: Progressing 10/12/2020 0016 by Earnestine Mealing, RN Outcome: Progressing   Problem: Respiratory: Goal: Will maintain a patent airway 10/12/2020 0016 by Earnestine Mealing, RN Outcome: Progressing 10/12/2020 0016 by Earnestine Mealing, RN Outcome: Progressing Goal: Complications related to the disease process, condition or treatment will be avoided or minimized 10/12/2020 0016 by Earnestine Mealing, RN Outcome: Progressing 10/12/2020 0016 by Earnestine Mealing, RN Outcome: Progressing

## 2020-10-12 NOTE — Progress Notes (Signed)
Order received from Dr Marylu Lund to discontinue telemetry

## 2020-10-13 DIAGNOSIS — D518 Other vitamin B12 deficiency anemias: Secondary | ICD-10-CM | POA: Diagnosis not present

## 2020-10-13 DIAGNOSIS — I10 Essential (primary) hypertension: Secondary | ICD-10-CM | POA: Diagnosis not present

## 2020-10-13 DIAGNOSIS — U071 COVID-19: Secondary | ICD-10-CM | POA: Diagnosis not present

## 2020-10-13 DIAGNOSIS — R410 Disorientation, unspecified: Secondary | ICD-10-CM | POA: Diagnosis not present

## 2020-10-13 NOTE — TOC Progression Note (Signed)
Transition of Care [facility]) - Progression Note    Patient Details  Name: [name] [name] MRN: [id number] Date of Birth: 08-Aug-1926  [facility] of [facility]) CM/SW Contact  Margarito Liner, LCSW Phone Number: 10/13/2020, 9:48 AM  Clinical Narrative:  Son has accepted bed offer from [facility]. They will start insurance authorization today. Earliest they can take her is Thursday 10/6 if approved.   Expected Discharge Plan: Skilled Nursing Facility    Expected Discharge Plan and Services Expected Discharge Plan: Skilled Nursing Facility       Living arrangements for the past 2 months: Single Family Home                                       Social Determinants of Health (SDOH) Interventions    Readmission Risk Interventions No flowsheet data found.

## 2020-10-13 NOTE — Progress Notes (Signed)
Occupational Therapy Treatment Patient Details Name: [name] [name] MRN: [id number] DOB: 12-Jun-1926 Today's Date: 10/13/2020   History of present illness 87 y.o. female with medical history significant for CHF, hypertension, chronic venous stasis of both lower extremities and lymphedema, stage III chronic kidney disease and vitamin B12 deficiency anemia, coming with acute onset of generalized weakness and decreased responsiveness. Pt admitted for pneumonia due to COVID-19 with associated acute metabolic cephalopathy and generalized weakness.   OT comments  Pt seen for OT tx this date to f/u re: safety with ADLs/ADL mobility. OT encourages pt to come to EOB sitting which she tolerates ~10-15 minutes to participate in CGA/SETUP UB g/h tasks with cues for sequencing. Pt reluctant to mobilize and declines attempting to stand at this time despite encouragement. Will continue to follow.    Recommendations for follow up therapy are one component of a multi-disciplinary discharge planning process, led by the attending physician.  Recommendations may be updated based on patient status, additional functional criteria and insurance authorization.    Follow Up Recommendations  SNF    Equipment Recommendations  None recommended by OT    Recommendations for Other Services      Precautions / Restrictions Precautions Precautions: Fall Restrictions Weight Bearing Restrictions: No       Mobility Bed Mobility Overal bed mobility: Needs Assistance Bed Mobility: Supine to Sit;Sit to Supine     Supine to sit: Min assist;Mod assist;HOB elevated Sit to supine: Mod assist   General bed mobility comments: increased time and assist for bed mobility this date.    Transfers                 General transfer comment: pt declines to attempt standing    Balance Overall balance assessment: Needs assistance Sitting-balance support: Feet supported Sitting balance-Leahy Scale: Poor Sitting balance  - Comments: sits EOB x10-15 mins to participate in balance tasks as well as oral care and other aspects of grooming. Intermittent periods of posterior lean requiring tactile/verbal cues to correct Postural control: Posterior lean     Standing balance comment: deferred                           ADL either performed or assessed with clinical judgement   ADL Overall ADL's : Needs assistance/impaired     Grooming: Oral care;Supervision/safety;Min guard;Sitting Grooming Details (indicate cue type and reason): EOB sitting to perform oral care, CGA as pt periodically demos posterior lean when fatiguing                                     Vision       Perception     Praxis      Cognition Arousal/Alertness: Awake/alert Behavior During Therapy: WFL for tasks assessed/performed Overall Cognitive Status: History of cognitive impairments - at baseline                                 General Comments: pleasantly confused, follows one step commands with increased processing time, somewhat reluctant to mobilize. Oriented to self only        Exercises Other Exercises Other Exercises: OT engages pt in g/h tasks while seated EOB   Shoulder Instructions       General Comments      Pertinent Vitals/ Pain  Pain Assessment: No/denies pain  Home Living                                          Prior Functioning/Environment              Frequency  Min 1X/week        Progress Toward Goals  OT Goals(current goals can now be found in the care plan section)  Progress towards OT goals: OT to reassess next treatment  Acute Rehab OT Goals Patient Stated Goal: none stated OT Goal Formulation: Patient unable to participate in goal setting Time For Goal Achievement: 10/21/20  Plan Discharge plan remains appropriate;Frequency remains appropriate    Co-evaluation                 AM-PAC OT "6 Clicks" Daily  Activity     Outcome Measure   Help from another person eating meals?: A Little Help from another person taking care of personal grooming?: A Little Help from another person toileting, which includes using toliet, bedpan, or urinal?: A Lot Help from another person bathing (including washing, rinsing, drying)?: A Lot Help from another person to put on and taking off regular upper body clothing?: A Little Help from another person to put on and taking off regular lower body clothing?: A Lot 6 Click Score: 15    End of Session    OT Visit Diagnosis: Unsteadiness on feet (R26.81);Muscle weakness (generalized) (M62.81)   Activity Tolerance Patient tolerated treatment well;Patient limited by fatigue   Patient Left in bed;with call bell/phone within reach;with bed alarm set;with nursing/sitter in room   Nurse Communication Mobility status        Time: 2952-8413 OT Time Calculation (min): 18 min  Charges: OT General Charges $OT Visit: 1 Visit OT Treatments $Self Care/Home Management : 8-22 mins  Rejeana Brock, MS, OTR/L ascom [phone] 10/13/20, 4:17 PM

## 2020-10-13 NOTE — Progress Notes (Signed)
PROGRESS NOTE    [name] [name]  YIR:[id number] DOB: May 31, 1926 DOA: 10/04/2020 PCP: Mick Sell, MD   Chief complaint: Weakness and altered mental status. Brief Narrative:  [name] [name] is a 87 y.o. Caucasian female with medical history significant for CHF, hypertension, chronic venous stasis of both lower extremities and lymphedema, stage III chronic kidney disease and vitamin B12 deficiency anemia, coming with acute onset of generalized weakness and decreased responsiveness.  The patient had a positive outpatient test for COVID-19.  Chest x-ray showed bibasilar opacities suggesting atypical pneumonia.  Patient was treated with remdesivir and steroids.  Mental status at baseline.   Has been waiting for 10 day quarantine to be completed prior to dc to SNF on Thursday.   Assessment & Plan:   Active Problems:   Anemia due to vitamin B12 deficiency   Pneumonia due to COVID-19 virus  COVID-19 pneumonia. Acute metabolic encephalopathy. Leukopenia secondary to COVID-19 pneumonia. Mild thrombocytopenia secondary to COVID. Patient appeared to be confused but slightly has some dementia and per son she is confused on and off at baseline.   Now at baseline Completed remdesivir and prednisone treatment Currently on room air Ferritin trending down D-dimer trending down On 10-day quarantine for SNF     Hyponatremia  [place] to dehydration  Improved with hydration   HTN- Not on bp meds as outpatient. Amlodipine     PT OT -recommended SNF  DVT prophylaxis: Lovenox  Code Status: full Family Communication: None at bedside Disposition Plan:    Status is: Inpatient  Remains inpatient appropriate because: Unsafe discharge   dispo: The patient is from: Home              Anticipated d/c is to: SNF              Patient currently: Is medically stable   Difficult to place patient No   Awaiting to complete 10-day quarantine for COVID prior to going to SNF.  [facility]15/2020     I/O last 3 completed shifts: In: 0  Out: 200 [Urine:200] No intake/output data recorded.     Consultants:  None  Procedures: None  Antimicrobials: None  Subjective: No shortness of breath, chest pain, dizziness.   Objective: Vitals:   10/12/20 0816 10/12/20 1507 10/12/20 2118 10/13/20 0400  BP: 135/60 130/61 130/60 (!) 167/63  Pulse: (!) 58 (!) 54 (!) 53 (!) 52  Resp: 17 17 18 16   Temp: (!) 97.2 F (36.2 C) 98 F (36.7 C) 97.8 F (36.6 C) 97.8 F (36.6 C)  TempSrc: Oral Oral Oral Oral  SpO2:  97% 98% 91%  Weight:      Height:        Intake/Output Summary (Last 24 hours) at 10/13/2020 12/13/2020 Last data filed at 10/13/2020 0504 Gross per 24 hour  Intake 0 ml  Output 0 ml  Net 0 ml   Filed Weights   10/04/20 2003  Weight: 83.1 kg    Examination: NAD, calm CTA no wheeze rales Regular S1-S2 no gallops Soft benign positive bowel sounds No edema Awake and alert oriented to place, person but not time.  This is her baseline   Data Reviewed: I have personally reviewed following labs and imaging studies  CBC: Recent Labs  Lab 10/07/20 0534 10/08/20 0508  WBC 9.0 5.8  NEUTROABS 7.9* 5.1  HGB 12.8 12.7  HCT 38.0 39.0  MCV 93.4 95.4  PLT 182 189   Basic Metabolic Panel: Recent  Labs  Lab 10/07/20 0534 10/08/20 0508  NA 138 138  K 4.1 4.2  CL 105 106  CO2 25 26  GLUCOSE 124* 138*  BUN 37* 37*  CREATININE 0.85 1.02*  CALCIUM 9.0 8.8*   GFR: Estimated Creatinine Clearance: 38.2 mL/min (A) (by C-G formula based on SCr of 1.02 mg/dL (H)). Liver Function Tests: Recent Labs  Lab 10/07/20 0534 10/08/20 0508  AST 22 21  ALT 12 19  ALKPHOS 47 45  BILITOT 0.7 0.8  PROT 6.6 6.3*  ALBUMIN 3.2* 3.2*   No results for input(s): LIPASE, AMYLASE in the last 168 hours. No results for input(s): AMMONIA in the last 168 hours. Coagulation Profile: No results for input(s): INR, PROTIME in the last 168  hours. Cardiac Enzymes: No results for input(s): CKTOTAL, CKMB, CKMBINDEX, TROPONINI in the last 168 hours. BNP (last 3 results) No results for input(s): PROBNP in the last 8760 hours. HbA1C: No results for input(s): HGBA1C in the last 72 hours. CBG: Recent Labs  Lab 10/11/20 1616  GLUCAP 145*   Lipid Profile: No results for input(s): CHOL, HDL, LDLCALC, TRIG, CHOLHDL, LDLDIRECT in the last 72 hours. Thyroid Function Tests: No results for input(s): TSH, T4TOTAL, FREET4, T3FREE, THYROIDAB in the last 72 hours. Anemia Panel: No results for input(s): VITAMINB12, FOLATE, FERRITIN, TIBC, IRON, RETICCTPCT in the last 72 hours.  Sepsis Labs: No results for input(s): PROCALCITON, LATICACIDVEN in the last 168 hours.   Recent Results (from the past 240 hour(s))  Resp Panel by RT-PCR (Flu A&B, Covid) Nasopharyngeal Swab     Status: Abnormal   Collection Time: 10/04/20 10:47 PM   Specimen: Nasopharyngeal Swab; Nasopharyngeal(NP) swabs in vial transport medium  Result Value Ref Range Status   SARS Coronavirus 2 by RT PCR POSITIVE (A) NEGATIVE Final    Comment: RESULT CALLED TO, READ BACK BY AND VERIFIED WITH: PROVIDER @ 0115 10/05/20 [facility] (NOTE) SARS-CoV-2 target nucleic acids are DETECTED.  The SARS-CoV-2 RNA is generally detectable in upper respiratory specimens during the acute phase of infection. Positive results are indicative of the presence of the identified virus, but do not rule out bacterial infection or co-infection with other pathogens not detected by the test. Clinical correlation with patient history and other diagnostic information is necessary to determine patient infection status. The expected result is Negative.  Fact Sheet for Patients: BloggerCourse.com  Fact Sheet for Healthcare Providers: SeriousBroker.it  This test is not yet approved or cleared by the [place] FDA and  has been authorized for detection  and/or diagnosis of SARS-CoV-2 by FDA under an Emergency Use Authorization (EUA).  This EUA will remain in effect (meaning this test can be used ) for the duration of  the COVID-19 declaration under Section 564(b)(1) of the Act, 21 U.S.C. section 360bbb-3(b)(1), unless the authorization is terminated or revoked sooner.     Influenza A by PCR NEGATIVE NEGATIVE Final   Influenza B by PCR NEGATIVE NEGATIVE Final    Comment: (NOTE) The Xpert Xpress SARS-CoV-2/FLU/RSV plus assay is intended as an aid in the diagnosis of influenza from Nasopharyngeal swab specimens and should not be used as a sole basis for treatment. Nasal washings and aspirates are unacceptable for Xpert Xpress SARS-CoV-2/FLU/RSV testing.  Fact Sheet for Patients: BloggerCourse.com  Fact Sheet for Healthcare Providers: SeriousBroker.it  This test is not yet approved or cleared by the [place] FDA and has been authorized for detection and/or diagnosis of SARS-CoV-2 by FDA under an Emergency Use Authorization (EUA). This EUA  will remain in effect (meaning this test can be used) for the duration of the COVID-19 declaration under Section 564(b)(1) of the Act, 21 U.S.C. section 360bbb-3(b)(1), unless the authorization is terminated or revoked.  Performed at [facility], [address], [place], [place] [address]          Radiology Studies: No results found.      Scheduled Meds:  amLODipine  5 mg Oral Daily   vitamin C  500 mg Oral Daily   cholecalciferol  1,000 Units Oral Daily   enoxaparin (LOVENOX) injection  40 mg Subcutaneous Q24H   famotidine  20 mg Oral Daily   guaiFENesin  600 mg Oral BID   predniSONE  50 mg Oral Daily   cyanocobalamin  1,000 mcg Oral Daily   zinc sulfate  220 mg Oral Daily   Continuous Infusions:     LOS: 8 days    Time spent: 35 minutes with more than 50% on COC    Lynn Ito, MD [facility]   To contact the attending provider between 7A-7P or the covering provider during after hours 7P-7A, please log into the web site [email or web address] and access using universal [place] password for that web site. If you do not have the password, please call the hospital operator.  10/13/2020, 8:22 AM

## 2020-10-14 DIAGNOSIS — U071 COVID-19: Secondary | ICD-10-CM | POA: Diagnosis not present

## 2020-10-14 DIAGNOSIS — J1282 Pneumonia due to coronavirus disease 2019: Secondary | ICD-10-CM | POA: Diagnosis not present

## 2020-10-14 DIAGNOSIS — D518 Other vitamin B12 deficiency anemias: Secondary | ICD-10-CM | POA: Diagnosis not present

## 2020-10-14 DIAGNOSIS — R531 Weakness: Secondary | ICD-10-CM | POA: Diagnosis not present

## 2020-10-14 LAB — CBC
HCT: 41.2 % (ref 36.0–46.0)
Hemoglobin: 13.3 g/dL (ref 12.0–15.0)
MCH: 31.2 pg (ref 26.0–34.0)
MCHC: 32.3 g/dL (ref 30.0–36.0)
MCV: 96.7 fL (ref 80.0–100.0)
Platelets: 230 10*3/uL (ref 150–400)
RBC: 4.26 MIL/uL (ref 3.87–5.11)
RDW: 12.5 % (ref 11.5–15.5)
WBC: 4.8 10*3/uL (ref 4.0–10.5)
nRBC: 0 % (ref 0.0–0.2)

## 2020-10-14 MED ORDER — GUAIFENESIN ER 600 MG PO TB12: 600.0000 mg | ORAL_TABLET | Freq: Two times a day (BID) | ORAL | Status: DC | PRN

## 2020-10-14 NOTE — Progress Notes (Signed)
Occupational Therapy Treatment Patient Details Name: [name] [name] MRN: [id number] DOB: [birth year] Today's Date: 10/14/2020   History of present illness 87 y.o. female with medical history significant for CHF, hypertension, chronic venous stasis of both lower extremities and lymphedema, stage III chronic kidney disease and vitamin B12 deficiency anemia, coming with acute onset of generalized weakness and decreased responsiveness. Pt admitted for pneumonia due to COVID-19 with associated acute metabolic cephalopathy and generalized weakness.   OT comments  Pt seen for OT Tx this date to f/u re: safety with ADLs/ADL mobility. Pt reluctant to mobilize, but agreeable with increased encouragement and processing time. She requires MOD A +2 to CTA from elevated surface and MAX A +2 to address peri care in standing from elevated surface. She tolerates 2 stands. Seated EOB, she requries moderate cueing and MOD A for UB bathing and MIN A for UB dressing to don clean gown. She is returned to bed at end of session with clean linens, all needs met and in reach. Will continue to follow acutely.    Recommendations for follow up therapy are one component of a multi-disciplinary discharge planning process, led by the attending physician.  Recommendations may be updated based on patient status, additional functional criteria and insurance authorization.    Follow Up Recommendations  SNF    Equipment Recommendations  None recommended by OT    Recommendations for Other Services      Precautions / Restrictions Precautions Precautions: Fall Restrictions Weight Bearing Restrictions: No       Mobility Bed Mobility Overal bed mobility: Needs Assistance Bed Mobility: Supine to Sit;Sit to Supine Rolling: Min assist   Supine to sit: Min assist;Mod assist;HOB elevated Sit to supine: Mod assist;Max assist   General bed mobility comments: increased time, use of rails    Transfers Overall transfer level:  Needs assistance Equipment used: Rolling walker (2 wheeled) Transfers: Sit to/from Stand Sit to Stand: Max assist;+2 physical assistance;From elevated surface         General transfer comment: increased time and assist to extend hips to full stand which she still does not fully acheive with 2p assisting    Balance Overall balance assessment: Needs assistance Sitting-balance support: Feet supported Sitting balance-Leahy Scale: Poor       Standing balance-Leahy Scale: Zero                             ADL either performed or assessed with clinical judgement   ADL Overall ADL's : Needs assistance/impaired     Grooming: Wash/dry face;Set up;Sitting;Cueing for sequencing   Upper Body Bathing: Minimal assistance;Moderate assistance;Sitting;Cueing for sequencing   Lower Body Bathing: Maximal assistance;Total assistance;Sit to/from stand   Upper Body Dressing : Minimal assistance;Sitting;Cueing for sequencing   Lower Body Dressing: Maximal assistance;+2 for physical assistance;Sit to/from stand Lower Body Dressing Details (indicate cue type and reason): to doff brief and don clean one                     Vision Patient Visual Report: No change from baseline     Perception     Praxis      Cognition Arousal/Alertness: Awake/alert Behavior During Therapy: WFL for tasks assessed/performed Overall Cognitive Status: History of cognitive impairments - at baseline  General Comments: pleasantly confused, follows one step commands with increased processing time, somewhat reluctant to mobilize. Oriented to self only        Exercises Exercises: General Lower Extremity General Exercises - Lower Extremity Ankle Circles/Pumps: AAROM;Both;15 reps Heel Slides: AAROM;Both;10 reps Hip ABduction/ADduction: AAROM;Both;10 reps Other Exercises Other Exercises: OT engages pt in bathing and dressing tasks   Shoulder  Instructions       General Comments  (Education provided regarding importance of OOB and increasing activity. Pt stated she understood, but wasn't getting up.)    Pertinent Vitals/ Pain       Pain Assessment: No/denies pain  Home Living                                          Prior Functioning/Environment              Frequency  Min 1X/week        Progress Toward Goals  OT Goals(current goals can now be found in the care plan section)  Progress towards OT goals: Progressing toward goals  Acute Rehab OT Goals Patient Stated Goal: none stated OT Goal Formulation: Patient unable to participate in goal setting Time For Goal Achievement: 10/21/20  Plan Discharge plan remains appropriate;Frequency remains appropriate    Co-evaluation                 AM-PAC OT "6 Clicks" Daily Activity     Outcome Measure   Help from another person eating meals?: A Little Help from another person taking care of personal grooming?: A Little Help from another person toileting, which includes using toliet, bedpan, or urinal?: A Lot Help from another person bathing (including washing, rinsing, drying)?: A Lot Help from another person to put on and taking off regular upper body clothing?: A Little Help from another person to put on and taking off regular lower body clothing?: A Lot 6 Click Score: 15    End of Session Equipment Utilized During Treatment: Gait belt;Rolling walker  OT Visit Diagnosis: Unsteadiness on feet (R26.81);Muscle weakness (generalized) (M62.81)   Activity Tolerance Patient tolerated treatment well;Patient limited by fatigue   Patient Left in bed;with call bell/phone within reach;with bed alarm set;with nursing/sitter in room   Nurse Communication Mobility status        Time: 7711-6579 OT Time Calculation (min): 26 min  Charges: OT General Charges $OT Visit: 1 Visit OT Treatments $Self Care/Home Management : 8-22  mins $Therapeutic Activity: 8-22 mins  Gerrianne Scale, MS, OTR/L ascom [phone] 10/14/20, 4:59 PM

## 2020-10-14 NOTE — Progress Notes (Signed)
Physical Therapy Treatment Patient Details Name: [name] [name] MRN: [id number] DOB: [birth year] Today's Date: 10/14/2020   History of Present Illness 87 y.o. female with medical history significant for CHF, hypertension, chronic venous stasis of both lower extremities and lymphedema, stage III chronic kidney disease and vitamin B12 deficiency anemia, coming with acute onset of generalized weakness and decreased responsiveness. Pt admitted for pneumonia due to COVID-19 with associated acute metabolic cephalopathy and generalized weakness.    PT Comments    Pt pleasantly confused and somewhat unmotivated to increase functional mobility at time of visit. Treatment session focused on education, AAROM B LE's in supine to facilitate strength/circulation, repositioning in upright long sitting for safe food consumption and maintain skin integrity. Pt refused OOB activity this visit. Will attempt again next visit. Continue to recommend SNF placement once medically stable.     Recommendations for follow up therapy are one component of a multi-disciplinary discharge planning process, led by the attending physician.  Recommendations may be updated based on patient status, additional functional criteria and insurance authorization.  Follow Up Recommendations  SNF     Equipment Recommendations       Recommendations for Other Services       Precautions / Restrictions Precautions Precautions: Fall Restrictions Weight Bearing Restrictions: No     Mobility  Bed Mobility Overal bed mobility: Needs Assistance Bed Mobility: Rolling Rolling: Min assist         General bed mobility comments:  (Pt assisted with positioning in upright position and facilitation of maintaining skin integrity.)    Transfers                    Ambulation/Gait                 Stairs             Wheelchair Mobility    Modified Rankin (Stroke Patients Only)       Balance                                             Cognition Arousal/Alertness: Awake/alert Behavior During Therapy: WFL for tasks assessed/performed Overall Cognitive Status: History of cognitive impairments - at baseline                                 General Comments: pleasantly confused, follows one step commands with increased processing time, somewhat reluctant to mobilize. Oriented to self only      Exercises General Exercises - Lower Extremity Ankle Circles/Pumps: AAROM;Both;15 reps Heel Slides: AAROM;Both;10 reps Hip ABduction/ADduction: AAROM;Both;10 reps    General Comments General comments (skin integrity, edema, etc.):  (Education provided regarding importance of OOB and increasing activity. Pt stated she understood, but wasn't getting up.)      Pertinent Vitals/Pain Pain Assessment: No/denies pain    Home Living                      Prior Function            PT Goals (current goals can now be found in the care plan section) Acute Rehab PT Goals Patient Stated Goal: none stated    Frequency    Min 2X/week      PT Plan Current plan remains appropriate    Co-evaluation  AM-PAC PT "6 Clicks" Mobility   Outcome Measure  Help needed turning from your back to your side while in a flat bed without using bedrails?: A Little Help needed moving from lying on your back to sitting on the side of a flat bed without using bedrails?: A Little Help needed moving to and from a bed to a chair (including a wheelchair)?: Total Help needed standing up from a chair using your arms (e.g., wheelchair or bedside chair)?: A Lot Help needed to walk in hospital room?: Total Help needed climbing 3-5 steps with a railing? : Total 6 Click Score: 11    End of Session   Activity Tolerance: Patient tolerated treatment well Patient left: in bed;with call bell/phone within reach;with bed alarm set Nurse Communication: Mobility status PT  Visit Diagnosis: Unsteadiness on feet (R26.81);Muscle weakness (generalized) (M62.81)     Time: 1420-1440 PT Time Calculation (min) (ACUTE ONLY): 20 min  Charges:  $Therapeutic Activity: 8-22 mins                    Zadie Cleverly, PTA    Jannet Askew 10/14/2020, 2:57 PM

## 2020-10-14 NOTE — Progress Notes (Signed)
[name] [name]  TDD:[id number] DOB: 03/08/1926 DOA: 10/04/2020 PCP: Mick Sell, MD    Brief Narrative:  [phone] w/ a hx of CHF, hypertension, chronic venous stasis of both lower extremities and lymphedema, stage III chronic kidney disease and vitamin B12 deficiency anemia, wgi presented w/ the acute onset of generalized weakness and decreased responsiveness.  The patient had a positive outpatient test for COVID-19.  Chest x-ray showed bibasilar opacities suggesting atypical pneumonia.    Date of Positive COVID Test:  9/25 (last day of quarantine 10/5)  COVID-19 specific Treatment: Remdesivir 9/26 > 9/30 Steroid 9/25 > 10/4  Consultants:  None  Code Status: FULL CODE  Antimicrobials:  None  DVT prophylaxis: Lovenox  Subjective: Resting comfortably in no acute distress.  Assessment & Plan:  COVID Pneumonia Was treated with Remdesivir and prednisone -clinically stabilized  Acute metabolic encephalopathy -baseline dementia Appears to be at her baseline  Leukopenia Due to COVID  Mild thrombocytopenia Due to COVID  Hyponatremia Due to dehydration -resolved  HTN Blood pressure controlled  Family Communication: No family present at time of exam Status is: Inpatient  Remains inpatient appropriate because:Unsafe d/c plan  Dispo: The patient is from: Home              Anticipated d/c is to: SNF              Patient currently is medically stable to d/c.   Difficult to place patient No         Objective: Blood pressure (!) 150/57, pulse (!) 55, temperature 97.9 F (36.6 C), temperature source Axillary, resp. rate 17, height 5\' 6"  (1.676 m), weight 83.1 kg, SpO2 98 %.  Intake/Output Summary (Last 24 hours) at 10/14/2020 1011 Last data filed at 10/14/2020 0448 Gross per 24 hour  Intake --  Output 900 ml  Net -900 ml   Filed Weights   10/04/20 2003  Weight: 83.1 kg    Examination: General: No acute respiratory distress Lungs: Clear to  auscultation bilaterally without wheezes or crackles Cardiovascular: Regular rate and rhythm without murmur gallop or rub normal S1 and S2 Abdomen: Nontender, nondistended, soft, bowel sounds positive, no rebound, no ascites, no appreciable mass Extremities: No significant cyanosis, clubbing, or edema bilateral lower extremities  CBC: Recent Labs  Lab 10/08/20 0508 10/14/20 0506  WBC 5.8 4.8  NEUTROABS 5.1  --   HGB 12.7 13.3  HCT 39.0 41.2  MCV 95.4 96.7  PLT 189 230   Basic Metabolic Panel: Recent Labs  Lab 10/08/20 0508  NA 138  K 4.2  CL 106  CO2 26  GLUCOSE 138*  BUN 37*  CREATININE 1.02*  CALCIUM 8.8*   GFR: Estimated Creatinine Clearance: 38.2 mL/min (A) (by C-G formula based on SCr of 1.02 mg/dL (H)).  Liver Function Tests: Recent Labs  Lab 10/08/20 0508  AST 21  ALT 19  ALKPHOS 45  BILITOT 0.8  PROT 6.3*  ALBUMIN 3.2*     CBG: Recent Labs  Lab 10/11/20 1616  GLUCAP 145*    Recent Results (from the past 240 hour(s))  Resp Panel by RT-PCR (Flu A&B, Covid) Nasopharyngeal Swab     Status: Abnormal   Collection Time: 10/04/20 10:47 PM   Specimen: Nasopharyngeal Swab; Nasopharyngeal(NP) swabs in vial transport medium  Result Value Ref Range Status   SARS Coronavirus 2 by RT PCR POSITIVE (A) NEGATIVE Final    Comment: RESULT CALLED TO, READ BACK BY AND VERIFIED WITH: PROVIDER @ 0115 10/05/20 LFD (NOTE)  SARS-CoV-2 target nucleic acids are DETECTED.  The SARS-CoV-2 RNA is generally detectable in upper respiratory specimens during the acute phase of infection. Positive results are indicative of the presence of the identified virus, but do not rule out bacterial infection or co-infection with other pathogens not detected by the test. Clinical correlation with patient history and other diagnostic information is necessary to determine patient infection status. The expected result is Negative.  Fact Sheet for  Patients: BloggerCourse.com  Fact Sheet for Healthcare Providers: SeriousBroker.it  This test is not yet approved or cleared by the [place] FDA and  has been authorized for detection and/or diagnosis of SARS-CoV-2 by FDA under an Emergency Use Authorization (EUA).  This EUA will remain in effect (meaning this test can be used ) for the duration of  the COVID-19 declaration under Section 564(b)(1) of the Act, 21 U.S.C. section 360bbb-3(b)(1), unless the authorization is terminated or revoked sooner.     Influenza A by PCR NEGATIVE NEGATIVE Final   Influenza B by PCR NEGATIVE NEGATIVE Final    Comment: (NOTE) The Xpert Xpress SARS-CoV-2/FLU/RSV plus assay is intended as an aid in the diagnosis of influenza from Nasopharyngeal swab specimens and should not be used as a sole basis for treatment. Nasal washings and aspirates are unacceptable for Xpert Xpress SARS-CoV-2/FLU/RSV testing.  Fact Sheet for Patients: BloggerCourse.com  Fact Sheet for Healthcare Providers: SeriousBroker.it  This test is not yet approved or cleared by the [place] FDA and has been authorized for detection and/or diagnosis of SARS-CoV-2 by FDA under an Emergency Use Authorization (EUA). This EUA will remain in effect (meaning this test can be used) for the duration of the COVID-19 declaration under Section 564(b)(1) of the Act, 21 U.S.C. section 360bbb-3(b)(1), unless the authorization is terminated or revoked.  Performed at [facility], [address] [place], [place] [address]      Scheduled Meds:  amLODipine  5 mg Oral Daily   vitamin C  500 mg Oral Daily   cholecalciferol  1,000 Units Oral Daily   enoxaparin (LOVENOX) injection  40 mg Subcutaneous Q24H   famotidine  20 mg Oral Daily   guaiFENesin  600 mg Oral BID   cyanocobalamin  1,000 mcg Oral Daily   zinc sulfate  220 mg  Oral Daily      LOS: 9 days   Lonia Blood, MD [facility] Office  [phone] Pager - Text Page per Loretha Stapler  If 7PM-7AM, please contact night-coverage per Amion 10/14/2020, 10:11 AM

## 2020-10-14 NOTE — TOC Progression Note (Addendum)
Transition of Care [facility]) - Progression Note    Patient Details  Name: [name] [name] MRN: [id number] Date of Birth: 16-Apr-1926  [facility] CM/SW Contact  Chapman Fitch, RN Phone Number: 10/14/2020, 2:39 PM  Clinical Narrative:     Message sent to [facility] for update on auth.  Awaiting response   Update:  auth still pending  Expected Discharge Plan: Skilled Nursing Facility    Expected Discharge Plan and Services Expected Discharge Plan: Skilled Nursing Facility       Living arrangements for the past 2 months: Single Family Home                                       Social Determinants of Health (SDOH) Interventions    Readmission Risk Interventions No flowsheet data found.

## 2020-10-15 DIAGNOSIS — D518 Other vitamin B12 deficiency anemias: Secondary | ICD-10-CM | POA: Diagnosis not present

## 2020-10-15 DIAGNOSIS — J1282 Pneumonia due to coronavirus disease 2019: Secondary | ICD-10-CM | POA: Diagnosis not present

## 2020-10-15 DIAGNOSIS — U071 COVID-19: Secondary | ICD-10-CM | POA: Diagnosis not present

## 2020-10-15 DIAGNOSIS — R531 Weakness: Secondary | ICD-10-CM | POA: Diagnosis not present

## 2020-10-15 MED ORDER — BISACODYL 10 MG RE SUPP: 10.0000 mg | Freq: Every day | RECTAL | Status: DC | PRN

## 2020-10-15 MED ORDER — SENNOSIDES-DOCUSATE SODIUM 8.6-50 MG PO TABS
1.0000 | ORAL_TABLET | Freq: Two times a day (BID) | ORAL | Status: DC
  Administered 2020-10-15 – 2020-10-18 (×7): 1 via ORAL
  Filled 2020-10-15 (×8): qty 1

## 2020-10-15 MED ORDER — SORBITOL 70 % SOLN
960.0000 mL | TOPICAL_OIL | Freq: Once | ORAL | Status: DC | PRN
  Filled 2020-10-15: qty 473

## 2020-10-15 MED ORDER — POLYETHYLENE GLYCOL 3350 17 G PO PACK
17.0000 g | PACK | Freq: Two times a day (BID) | ORAL | Status: DC
  Administered 2020-10-15 – 2020-10-18 (×6): 17 g via ORAL
  Filled 2020-10-15 (×8): qty 1

## 2020-10-15 MED ORDER — LACTULOSE 10 GM/15ML PO SOLN: 10.0000 g | Freq: Three times a day (TID) | ORAL | Status: DC | PRN

## 2020-10-15 NOTE — TOC Progression Note (Signed)
Transition of Care [facility]) - Progression Note    Patient Details  Name: Darryl Blumenstein MRN: [id number] Date of Birth: 28-Oct-1926  Transition of Care [facility]) CM/SW Contact  Margarito Liner, LCSW Phone Number: 10/15/2020, 9:18 AM  Clinical Narrative:   Insurance authorization has been denied. [facility] admissions coordinator is appealing. Turnaround time typically 72 hours. Son is aware. Said she is from home alone and has personal care services through [facility], 6 days per week for 3 hours. Son lives close by and checks on her daily. He stated that she is typically only slightly confused. Patient only AOx1 at this time. CSW called [facility]. They will have [facility] representative [facility]) call back. Will see if they can increase hours if she is unable to discharge to SNF.  Expected Discharge Plan: Skilled Nursing Facility    Expected Discharge Plan and Services Expected Discharge Plan: Skilled Nursing Facility       Living arrangements for the past 2 months: Single Family Home                                       Social Determinants of Health (SDOH) Interventions    Readmission Risk Interventions No flowsheet data found.

## 2020-10-15 NOTE — Progress Notes (Signed)
   [name] [name]  FBP:[id number] DOB: 05-Feb-1926 DOA: 10/04/2020 PCP: Mick Sell, MD    Brief Narrative:  [phone] with a history of CHF, HTN, chronic venous stasis/lymphedema BLE, stage III CKD, and B12 deficiency anemia who presented to the ED with the acute onset of severe generalized weakness with decreased responsiveness.  The patient had a positive outpatient test for COVID.  CXR in the ED noted bibasilar opacities possibly suggestive of an atypical pneumonia.   Date of Positive COVID Test:  9/25 (last day of quarantine 10/5)  COVID-19 specific Treatment: Remdesivir 9/26 > 9/30 Steroid 9/25 > 10/4  Consultants:  None  Code Status: FULL CODE  Antimicrobials:  None  DVT prophylaxis: Lovenox  Subjective: Afebrile.  Vital signs stable.  Saturation 99% on room air.  CBC unremarkable.  Tells me she is "ready to leave this place" and has no new complaints.  Is resting comfortably in bed.  Assessment & Plan:  COVID Pneumonia Was treated with Remdesivir and prednisone -clinically stabilized -now beyond her 10-day isolation period   Acute metabolic encephalopathy -baseline dementia Appears to be at her baseline  Leukopenia Due to COVID -resolved  Mild thrombocytopenia Due to COVID -resolved  Hyponatremia Due to dehydration -resolved  HTN Blood pressure controlled  Severe deconditioning Patient will need a rehab stay at a skilled nursing facility to regain her mobility  Family Communication: No family present at time of exam Status is: Inpatient  Remains inpatient appropriate because:Unsafe d/c plan  Dispo: The patient is from: Home              Anticipated d/c is to: SNF              Patient currently is medically stable to d/c.   Difficult to place patient No   Objective: Blood pressure (!) 113/58, pulse 79, temperature 97.8 F (36.6 C), temperature source Oral, resp. rate 16, height 5\' 6"  (1.676 m), weight 83.1 kg, SpO2 99 %.  Intake/Output Summary  (Last 24 hours) at 10/15/2020 1041 Last data filed at 10/15/2020 0609 Gross per 24 hour  Intake --  Output 600 ml  Net -600 ml    Filed Weights   10/04/20 2003  Weight: 83.1 kg    Examination: General: No acute respiratory distress Lungs: Clear to auscultation bilaterally without wheezes or crackles Cardiovascular: RRR without murmur Abdomen: NT/ND, soft, BS positive, no rebound Extremities: No significant edema bilateral lower extremities  CBC: Recent Labs  Lab 10/14/20 0506  WBC 4.8  HGB 13.3  HCT 41.2  MCV 96.7  PLT 230    Basic Metabolic Panel: No results for input(s): NA, K, CL, CO2, GLUCOSE, BUN, CREATININE, CALCIUM, MG, PHOS in the last 168 hours.  GFR: Estimated Creatinine Clearance: 38.2 mL/min (A) (by C-G formula based on SCr of 1.02 mg/dL (H)).   CBG: Recent Labs  Lab 10/11/20 1616  GLUCAP 145*     No results found for this or any previous visit (from the past 240 hour(s)).    Scheduled Meds:  enoxaparin (LOVENOX) injection  40 mg Subcutaneous Q24H   famotidine  20 mg Oral Daily   cyanocobalamin  1,000 mcg Oral Daily      LOS: 10 days   12/11/20, MD [facility] Office  [phone] Pager - Text Page per [phone]  If 7PM-7AM, please contact night-coverage per Amion 10/15/2020, 10:41 AM

## 2020-10-16 DIAGNOSIS — R531 Weakness: Secondary | ICD-10-CM | POA: Diagnosis not present

## 2020-10-16 DIAGNOSIS — U071 COVID-19: Secondary | ICD-10-CM | POA: Diagnosis not present

## 2020-10-16 DIAGNOSIS — J1282 Pneumonia due to coronavirus disease 2019: Secondary | ICD-10-CM | POA: Diagnosis not present

## 2020-10-16 MED ORDER — ACETAMINOPHEN 500 MG PO TABS: 500.0000 mg | ORAL_TABLET | Freq: Four times a day (QID) | ORAL | Status: DC | PRN

## 2020-10-16 NOTE — Care Management Important Message (Signed)
Important Message  Patient Details  Name: [name] [name] MRN: [id number] Date of Birth: 26-Dec-1926   Medicare Important Message Given:  Yes  Copy of Medicare IM left in patient's room for reference on windowsill.  Spoke with son, [name], at [phone] to review Medicare IM.    Johnell Comings 10/16/2020, 11:51 AM

## 2020-10-16 NOTE — Progress Notes (Signed)
[name] [name]  IEP:[id number] DOB: 10/11/1926 DOA: 10/04/2020 PCP: Mick Sell, MD    Brief Narrative:  [phone] with a history of CHF, HTN, chronic venous stasis/lymphedema BLE, stage III CKD, and B12 deficiency anemia who presented to the ED with the acute onset of severe generalized weakness with decreased responsiveness.  The patient had a positive outpatient test for COVID.  CXR in the ED noted bibasilar opacities possibly suggestive of an atypical pneumonia.   Date of Positive COVID Test:  9/25 (last day of quarantine 10/5)  COVID-19 specific Treatment: Remdesivir 9/26 > 9/30 Steroid 9/25 > 10/4  Consultants:  None  Code Status: FULL CODE  Antimicrobials:  None  DVT prophylaxis: Lovenox  Subjective: No acute events noted overnight.  Patient is medically stable for discharge to SNF for rehab stay, but her insurance has denied authorization and this is delaying her appropriate discharge.  Assessment & Plan:  COVID Pneumonia Was treated with Remdesivir and prednisone -clinically stabilized -now beyond her 10-day isolation period   Acute metabolic encephalopathy -baseline dementia Appears to be at her baseline  Leukopenia Due to COVID -resolved  Mild thrombocytopenia Due to COVID -resolved  Hyponatremia Due to dehydration -resolved  HTN Blood pressure controlled  Severe deconditioning Patient will need a rehab stay at a skilled nursing facility to regain her mobility -she is clearly too weak to be safely discharged home at this time  Family Communication: No family present at time of exam Status is: Inpatient  Remains inpatient appropriate because:Unsafe d/c plan  Dispo: The patient is from: Home              Anticipated d/c is to: SNF              Patient currently is medically stable to d/c.   Difficult to place patient No   Objective: Blood pressure 113/61, pulse (!) 58, temperature 97.7 F (36.5 C), temperature source Oral, resp. rate 20,  height 5\' 6"  (1.676 m), weight 83.1 kg, SpO2 92 %.  Intake/Output Summary (Last 24 hours) at 10/16/2020 1002 Last data filed at 10/16/2020 0538 Gross per 24 hour  Intake --  Output 800 ml  Net -800 ml    Filed Weights   10/04/20 2003  Weight: 83.1 kg    Examination: General: No acute respiratory distress Lungs: Clear to auscultation bilaterally  Cardiovascular: RRR  Abdomen: NT/ND, soft, BS positive, no rebound Extremities: No significant edema bilateral lower extremities  CBC: Recent Labs  Lab 10/14/20 0506  WBC 4.8  HGB 13.3  HCT 41.2  MCV 96.7  PLT 230    Basic Metabolic Panel: No results for input(s): NA, K, CL, CO2, GLUCOSE, BUN, CREATININE, CALCIUM, MG, PHOS in the last 168 hours.  GFR: Estimated Creatinine Clearance: 38.2 mL/min (A) (by C-G formula based on SCr of 1.02 mg/dL (H)).   CBG: Recent Labs  Lab 10/11/20 1616  GLUCAP 145*     No results found for this or any previous visit (from the past 240 hour(s)).    Scheduled Meds:  enoxaparin (LOVENOX) injection  40 mg Subcutaneous Q24H   famotidine  20 mg Oral Daily   polyethylene glycol  17 g Oral BID   senna-docusate  1 tablet Oral BID   cyanocobalamin  1,000 mcg Oral Daily      LOS: 11 days   12/11/20, MD [facility] Office  [phone] Pager - Text Page per [phone]  If 7PM-7AM, please contact night-coverage per Amion 10/16/2020, 10:02 AM

## 2020-10-16 NOTE — TOC Progression Note (Addendum)
Transition of Care [facility]) - Progression Note    Patient Details  Name: [name] [name] MRN: [id number] Date of Birth: November 04, 1926  Transition of Care [facility]) CM/SW Contact  Margarito Liner, LCSW Phone Number: 10/16/2020, 12:40 PM  Clinical Narrative: Appeal still pending.    2:04 pm: Received voicemail from [employer] stating that we should have determination no later than Sunday. Spoke to [facility] and they said in order to increase hours, patient would need assessment. She is already scheduled for her annual assessment on 10/10. Will notify them if [facility] still denies SNF. Called son and provided update.  2:38 pm: Faxed MD progress note to [employer] per request.  Expected Discharge Plan: Skilled Nursing Facility    Expected Discharge Plan and Services Expected Discharge Plan: Skilled Nursing Facility       Living arrangements for the past 2 months: Single Family Home                                       Social Determinants of Health (SDOH) Interventions    Readmission Risk Interventions No flowsheet data found.

## 2020-10-16 NOTE — Progress Notes (Signed)
Occupational Therapy Treatment Patient Details Name: [name] [name] MRN: [id number] DOB: [birth year] Today's Date: 10/16/2020   History of present illness 87 y.o. female with medical history significant for CHF, hypertension, chronic venous stasis of both lower extremities and lymphedema, stage III chronic kidney disease and vitamin B12 deficiency anemia, coming with acute onset of generalized weakness and decreased responsiveness. Pt admitted for pneumonia due to COVID-19 with associated acute metabolic cephalopathy and generalized weakness. Last day of COVID isolation was 10/5.   OT comments  Pt seen for OT Tx this date to f/u re: safety with ADLs/ADL mobility. Pt requires extensive time to engage in self-feeding. OT assists initially by engaging pt in repositioning with MAX A to advance towards HOB. OT repositions pt in chair position in bed. With SETUP as well as MIN A to cut up food items and downgrade pt from using fork to spoon to reduce spillage, pt is able to successfully consume ~one third of her meal. Pt requires cues for safety to slow down as she attempts to take several bites in rapid succession. She is left with all needs met and in reach and OT updated pt's CNA regarding optimized feeding recommendations.    Recommendations for follow up therapy are one component of a multi-disciplinary discharge planning process, led by the attending physician.  Recommendations may be updated based on patient status, additional functional criteria and insurance authorization.    Follow Up Recommendations  SNF    Equipment Recommendations  None recommended by OT    Recommendations for Other Services      Precautions / Restrictions Precautions Precautions: Fall Restrictions Weight Bearing Restrictions: No       Mobility Bed Mobility Overal bed mobility: Needs Assistance Bed Mobility: Rolling (scooting to HOB, scooting left in supine)     Supine to sit: Total assist Sit to supine:  Total assist   General bed mobility comments: MAX A for repositioning to advance toward [facility] in trendelenberg. Requires MAX verbal/tactile cues to engage in repositioning to help therapist and recruit muscles.    Transfers                      Balance                                           ADL either performed or assessed with clinical judgement   ADL Overall ADL's : Needs assistance/impaired Eating/Feeding: Minimal assistance;Sitting;Bed level Eating/Feeding Details (indicate cue type and reason): chair position in bed, cues for sequencing, OT cuts up food, downgraded by switching to spoon rather than fork to reduce spillage                                         Vision Patient Visual Report: No change from baseline     Perception     Praxis      Cognition Arousal/Alertness: Awake/alert Behavior During Therapy: Impulsive Overall Cognitive Status: History of cognitive impairments - at baseline                                 General Comments: Pt able to follow most simple one step commands with increased processing time. She requires cues for safety with  eating such as slowing down because she is attempting to take too many bites back to back in quick succession.        Exercises Other Exercises Other Exercises: OT engages pt in self-feeding   Shoulder Instructions       General Comments      Pertinent Vitals/ Pain       Pain Assessment: No/denies pain  Home Living                                          Prior Functioning/Environment              Frequency  Min 1X/week        Progress Toward Goals  OT Goals(current goals can now be found in the care plan section)  Progress towards OT goals: Progressing toward goals  Acute Rehab OT Goals Patient Stated Goal: none stated OT Goal Formulation: Patient unable to participate in goal setting Time For Goal Achievement:  10/21/20  Plan Discharge plan remains appropriate;Frequency remains appropriate    Co-evaluation                 AM-PAC OT "6 Clicks" Daily Activity     Outcome Measure   Help from another person eating meals?: A Little Help from another person taking care of personal grooming?: A Little Help from another person toileting, which includes using toliet, bedpan, or urinal?: A Lot Help from another person bathing (including washing, rinsing, drying)?: A Lot Help from another person to put on and taking off regular upper body clothing?: A Little Help from another person to put on and taking off regular lower body clothing?: A Lot 6 Click Score: 15    End of Session    OT Visit Diagnosis: Unsteadiness on feet (R26.81);Muscle weakness (generalized) (M62.81)   Activity Tolerance Patient tolerated treatment well   Patient Left in bed;with call bell/phone within reach;with bed alarm set;Other (comment) (in chair position)   Nurse Communication Mobility status        Time: 5638-7564 OT Time Calculation (min): 43 min  Charges: OT General Charges $OT Visit: 1 Visit OT Treatments $Self Care/Home Management : 23-37 mins $Therapeutic Activity: 8-22 mins  Gerrianne Scale, MS, OTR/L ascom [phone] 10/16/20, 5:15 PM

## 2020-10-16 NOTE — Progress Notes (Signed)
Physical Therapy Treatment Patient Details Name: [name] [name] MRN: [id number] DOB: 1926/06/17 Today's Date: 10/16/2020   History of Present Illness 87 y.o. female with medical history significant for CHF, hypertension, chronic venous stasis of both lower extremities and lymphedema, stage III chronic kidney disease and vitamin B12 deficiency anemia, coming with acute onset of generalized weakness and decreased responsiveness. Pt admitted for pneumonia due to COVID-19 with associated acute metabolic cephalopathy and generalized weakness. Last day of COVID isolation was 10/5.    PT Comments    Pt in bed asleep upon entry, slumped in bed with head resting on Rt upper bed rail. Pt awakens to voice, is interactive throughout, verbose, but conversation is tangential, pt often referencing an independent narrative from head that is most of the time revealing of her disorientation. Lunch tray presented but untouched, pt report "you feed me too much hear." Pt not agreeable, not persuaded to partake in activity at EOB, author attempts to educate. Pt follows some commands for reposition in bed, but ultimately is limited by her mental status. Pt pulled up in bed and centered, sat up HOB to ~40 degrees, and elevated heels. Pt appears comfortable at end of session, bed turned slightly toward window.      Recommendations for follow up therapy are one component of a multi-disciplinary discharge planning process, led by the attending physician.  Recommendations may be updated based on patient status, additional functional criteria and insurance authorization.  Follow Up Recommendations  SNF;Supervision - Intermittent;Supervision for mobility/OOB     Equipment Recommendations   (if DC to home, would need a mechanical lift and hospital bed.)    Recommendations for Other Services       Precautions / Restrictions Precautions Precautions: Fall Restrictions Weight Bearing Restrictions: No     Mobility   Bed Mobility Overal bed mobility: Needs Assistance Bed Mobility: Rolling (scooting to HOB, scooting left in supine)     Supine to sit: Total assist Sit to supine: Total assist   General bed mobility comments: does not follow cues for participation, reports "I'm weak, I can't do that." ST memory deficits make it difficult to meet her requests for positioning due to inconsistentcy throughout visit. (pt not agreeable to moving to EOB for PT session)    Transfers                    Ambulation/Gait                 Stairs             Wheelchair Mobility    Modified Rankin (Stroke Patients Only)       Balance                                            Cognition Arousal/Alertness: Awake/alert Behavior During Therapy: Impulsive Overall Cognitive Status: History of cognitive impairments - at baseline                                 General Comments: motivation is low at this time      Exercises      General Comments        Pertinent Vitals/Pain Pain Assessment: No/denies pain    Home Living  Prior Function            PT Goals (current goals can now be found in the care plan section) Acute Rehab PT Goals Patient Stated Goal: none stated PT Goal Formulation: Patient unable to participate in goal setting    Frequency    Min 2X/week      PT Plan Current plan remains appropriate    Co-evaluation              AM-PAC PT "6 Clicks" Mobility   Outcome Measure  Help needed turning from your back to your side while in a flat bed without using bedrails?: Total Help needed moving from lying on your back to sitting on the side of a flat bed without using bedrails?: Total Help needed moving to and from a bed to a chair (including a wheelchair)?: Total Help needed standing up from a chair using your arms (e.g., wheelchair or bedside chair)?: Total Help needed to walk in  hospital room?: Total Help needed climbing 3-5 steps with a railing? : Total 6 Click Score: 6    End of Session   Activity Tolerance: Patient tolerated treatment well Patient left: in bed;with call bell/phone within reach;with bed alarm set Nurse Communication: Mobility status PT Visit Diagnosis: Unsteadiness on feet (R26.81);Muscle weakness (generalized) (M62.81)     Time: 3149-7026 PT Time Calculation (min) (ACUTE ONLY): 12 min  Charges:  $Therapeutic Activity: 8-22 mins                    2:30 PM, 10/16/20 [name] [name], PT, DPT Physical Therapist - [name]  [phone] (ASCOM)    [name] [name] 10/16/2020, 2:26 PM

## 2020-10-17 DIAGNOSIS — U071 COVID-19: Secondary | ICD-10-CM | POA: Diagnosis not present

## 2020-10-17 DIAGNOSIS — R531 Weakness: Secondary | ICD-10-CM | POA: Diagnosis not present

## 2020-10-17 DIAGNOSIS — J1282 Pneumonia due to coronavirus disease 2019: Secondary | ICD-10-CM | POA: Diagnosis not present

## 2020-10-17 NOTE — Progress Notes (Addendum)
[name] [name]  MEQ:[id number] DOB: [birth year] DOA: 10/04/2020 PCP: Mick Sell, MD    Brief Narrative:  87yo who previously lived independently with a history of CHF, HTN, chronic venous stasis/lymphedema BLE, stage III CKD, and B12 deficiency anemia who presented to the ED with the acute onset of severe generalized weakness with decreased responsiveness.  The patient had a positive outpatient test for COVID.  CXR in the ED noted bibasilar opacities possibly suggestive of an atypical pneumonia.   Date of Positive COVID Test:  9/25 (last day of quarantine 10/5)  COVID-19 specific Treatment: Remdesivir 9/26 > 9/30 Steroid 9/25 > 10/4  Consultants:  None  Code Status: FULL CODE  Antimicrobials:  None  DVT prophylaxis: Lovenox  Subjective: Afebrile.  Vital signs stable.  No acute events reported overnight.  Patient is medically stable for discharge to SNF for rehab stay, but her insurance has denied authorization and this is delaying her appropriate discharge.  Assessment & Plan:  COVID Pneumonia Was treated with Remdesivir and prednisone -clinically stabilized -now beyond her 10-day isolation period   Acute metabolic encephalopathy -baseline dementia Appears to be at her baseline  Leukopenia Due to COVID -resolved  Mild thrombocytopenia Due to COVID -resolved  Hyponatremia Due to dehydration -resolved  HTN Blood pressure controlled  Severe deconditioning Patient will need a rehab stay at a skilled nursing facility to regain her mobility -she is clearly too weak to be safely discharged home at this time  Family Communication: No family present at time of exam Status is: Inpatient  Remains inpatient appropriate because:Unsafe d/c plan  Dispo: The patient is from: Home              Anticipated d/c is to: SNF              Patient currently is medically stable to d/c.   Difficult to place patient No   Objective: Blood pressure 106/60, pulse (!) 58,  temperature 98 F (36.7 C), temperature source Oral, resp. rate 18, height 5\' 6"  (1.676 m), weight 83.1 kg, SpO2 100 %.  Intake/Output Summary (Last 24 hours) at 10/17/2020 0957 Last data filed at 10/16/2020 2100 Gross per 24 hour  Intake 0 ml  Output 900 ml  Net -900 ml    Filed Weights   10/04/20 2003  Weight: 83.1 kg    Examination: General: No acute respiratory distress Lungs: Clear to auscultation bilaterally  Cardiovascular: RRR  Abdomen: NT/ND, soft, BS positive, no rebound Extremities: No significant edema bilateral lower extremities  CBC: Recent Labs  Lab 10/14/20 0506  WBC 4.8  HGB 13.3  HCT 41.2  MCV 96.7  PLT 230    Basic Metabolic Panel: No results for input(s): NA, K, CL, CO2, GLUCOSE, BUN, CREATININE, CALCIUM, MG, PHOS in the last 168 hours.  GFR: Estimated Creatinine Clearance: 38.2 mL/min (A) (by C-G formula based on SCr of 1.02 mg/dL (H)).   CBG: Recent Labs  Lab 10/11/20 1616  GLUCAP 145*     No results found for this or any previous visit (from the past 240 hour(s)).    Scheduled Meds:  enoxaparin (LOVENOX) injection  40 mg Subcutaneous Q24H   famotidine  20 mg Oral Daily   polyethylene glycol  17 g Oral BID   senna-docusate  1 tablet Oral BID   cyanocobalamin  1,000 mcg Oral Daily      LOS: 12 days   12/11/20, MD [facility] Office  [phone] Pager - Text Page per [phone]  If 7PM-7AM, please contact night-coverage per Amion 10/17/2020, 9:57 AM

## 2020-10-18 DIAGNOSIS — J1282 Pneumonia due to coronavirus disease 2019: Secondary | ICD-10-CM | POA: Diagnosis not present

## 2020-10-18 DIAGNOSIS — U071 COVID-19: Secondary | ICD-10-CM | POA: Diagnosis not present

## 2020-10-18 DIAGNOSIS — R531 Weakness: Secondary | ICD-10-CM | POA: Diagnosis not present

## 2020-10-18 DIAGNOSIS — D518 Other vitamin B12 deficiency anemias: Secondary | ICD-10-CM | POA: Diagnosis not present

## 2020-10-18 LAB — URINALYSIS, COMPLETE (UACMP) WITH MICROSCOPIC
Bilirubin Urine: NEGATIVE
Glucose, UA: NEGATIVE mg/dL
Ketones, ur: NEGATIVE mg/dL
Nitrite: POSITIVE — AB
Protein, ur: NEGATIVE mg/dL
Specific Gravity, Urine: 1.016 (ref 1.005–1.030)
pH: 7 (ref 5.0–8.0)

## 2020-10-18 LAB — CBC
HCT: 39.9 % (ref 36.0–46.0)
Hemoglobin: 12.8 g/dL (ref 12.0–15.0)
MCH: 30.4 pg (ref 26.0–34.0)
MCHC: 32.1 g/dL (ref 30.0–36.0)
MCV: 94.8 fL (ref 80.0–100.0)
Platelets: 259 10*3/uL (ref 150–400)
RBC: 4.21 MIL/uL (ref 3.87–5.11)
RDW: 12.9 % (ref 11.5–15.5)
WBC: 4.7 10*3/uL (ref 4.0–10.5)
nRBC: 0 % (ref 0.0–0.2)

## 2020-10-18 LAB — BASIC METABOLIC PANEL
Anion gap: 8 (ref 5–15)
BUN: 32 mg/dL — ABNORMAL HIGH (ref 8–23)
CO2: 27 mmol/L (ref 22–32)
Calcium: 8.3 mg/dL — ABNORMAL LOW (ref 8.9–10.3)
Chloride: 100 mmol/L (ref 98–111)
Creatinine, Ser: 1.18 mg/dL — ABNORMAL HIGH (ref 0.44–1.00)
GFR, Estimated: 43 mL/min — ABNORMAL LOW (ref >60)
Glucose, Bld: 124 mg/dL — ABNORMAL HIGH (ref 70–99)
Potassium: 4.6 mmol/L (ref 3.5–5.1)
Sodium: 135 mmol/L (ref 135–145)

## 2020-10-18 LAB — MAGNESIUM: Magnesium: 2.7 mg/dL — ABNORMAL HIGH (ref 1.7–2.4)

## 2020-10-18 MED ORDER — CEPHALEXIN 250 MG PO CAPS
250.0000 mg | ORAL_CAPSULE | Freq: Two times a day (BID) | ORAL | Status: DC
  Administered 2020-10-18: 250 mg via ORAL
  Filled 2020-10-18 (×4): qty 1

## 2020-10-18 NOTE — Progress Notes (Signed)
MD notified of patients odorous urine. Urinalysis ordered. Patient refused morning lab draw.

## 2020-10-18 NOTE — Plan of Care (Addendum)
Patient is alert to self. Patient refused her oral Senokot. She did not complain of any pain. Vital signs are stable. No further needs to be addressed at this time. Will continue to monitor.  Problem: Education: Goal: Knowledge of risk factors and measures for prevention of condition will improve Outcome: Progressing   Problem: Coping: Goal: Psychosocial and spiritual needs will be supported Outcome: Progressing   Problem: Coping: Goal: Level of anxiety will decrease Outcome: Progressing   Problem: Skin Integrity: Goal: Risk for impaired skin integrity will decrease Outcome: Progressing   Problem: Safety: Goal: Ability to remain free from injury will improve Outcome: Progressing

## 2020-10-18 NOTE — TOC Progression Note (Signed)
Transition of Care [facility]) - Progression Note    Patient Details  Name: [name] [name] MRN: [id number] Date of Birth: 02-Jan-1927  Transition of Care [facility]) CM/SW Contact  Liliana Cline, LCSW Phone Number: 10/18/2020, 10:29 AM  Clinical Narrative:   [facility] appeal approved for SNF. Per [facility] Representative Inetta Fermo, they can accept patient tomorrow (Monday). Updated MD, RN, and patient's son.    Expected Discharge Plan: Skilled Nursing Facility    Expected Discharge Plan and Services Expected Discharge Plan: Skilled Nursing Facility       Living arrangements for the past 2 months: Single Family Home                                       Social Determinants of Health (SDOH) Interventions    Readmission Risk Interventions No flowsheet data found.

## 2020-10-18 NOTE — Plan of Care (Signed)

## 2020-10-18 NOTE — Progress Notes (Signed)
[name] [name]  ACZ:[id number] DOB: 1926/06/25 DOA: 10/04/2020 PCP: Mick Sell, MD    Brief Narrative:  87yo who previously lived independently with a history of CHF, HTN, chronic venous stasis/lymphedema BLE, stage III CKD, and B12 deficiency anemia who presented to the ED with the acute onset of severe generalized weakness with decreased responsiveness.  The patient had a positive outpatient test for COVID.  CXR in the ED noted bibasilar opacities possibly suggestive of an atypical pneumonia.   Date of Positive COVID Test:  9/25 (last day of quarantine 10/5)  COVID-19 specific Treatment: Remdesivir 9/26 > 9/30 Steroid 9/25 > 10/4  Consultants:  None  Code Status: FULL CODE  Antimicrobials:  None  DVT prophylaxis: Lovenox  Subjective: UA was sent last night by nursing due to "odorous urine."  Unclear if patient had any other symptoms to suggest a UTI at that time.  Afebrile.  Vital signs stable.  Patient refused blood draw for labs checked this morning.  Patient is mildly agitated and a bit confused this morning.  She does not appear to be in distress or to be in pain.  Assessment & Plan:  COVID Pneumonia Was treated with Remdesivir and prednisone -clinically stabilized -now beyond her 10-day isolation period   Asymptomatic bacteriuria UA was sent simply for "odorous urine" -UA is abnormal but patient denies any symptoms to suggest an active UTI/cystitis -patient's change in mental status could be considered a symptom and therefore I will treat her with a 3-day course of empiric antibiotic  Acute metabolic encephalopathy - baseline dementia Worsened again this morning coincident with abnormal UA - treat probable UTI and follow  Leukopenia Due to COVID -resolved  Mild thrombocytopenia Due to COVID -resolved  Hyponatremia Due to dehydration -resolved  HTN Blood pressure controlled  Severe deconditioning Patient will need a rehab stay at a skilled nursing  facility to regain her mobility -she is clearly too weak to be safely discharged home at this time  Family Communication: No family present at time of exam Status is: Inpatient  Remains inpatient appropriate because:Unsafe d/c plan  Dispo: The patient is from: Home              Anticipated d/c is to: SNF              Patient currently is medically stable to d/c.   Difficult to place patient No   Objective: Blood pressure (!) 101/51, pulse (!) 58, temperature 98.7 F (37.1 C), temperature source Oral, resp. rate 18, height 5\' 6"  (1.676 m), weight 83.1 kg, SpO2 99 %.  Intake/Output Summary (Last 24 hours) at 10/18/2020 1037 Last data filed at 10/18/2020 0959 Gross per 24 hour  Intake 0 ml  Output 450 ml  Net -450 ml   Filed Weights   10/04/20 2003  Weight: 83.1 kg    Examination: General: No acute respiratory distress Lungs: Clear to auscultation bilaterally without wheeze Cardiovascular: RRR  Abdomen: NT/ND, soft, BS positive, no rebound Extremities: No edema bilateral lower extremities  CBC: Recent Labs  Lab 10/14/20 0506  WBC 4.8  HGB 13.3  HCT 41.2  MCV 96.7  PLT 230   Basic Metabolic Panel: No results for input(s): NA, K, CL, CO2, GLUCOSE, BUN, CREATININE, CALCIUM, MG, PHOS in the last 168 hours.  GFR: Estimated Creatinine Clearance: 38.2 mL/min (A) (by C-G formula based on SCr of 1.02 mg/dL (H)).   CBG: Recent Labs  Lab 10/11/20 1616  GLUCAP 145*    No results  found for this or any previous visit (from the past 240 hour(s)).    Scheduled Meds:  enoxaparin (LOVENOX) injection  40 mg Subcutaneous Q24H   famotidine  20 mg Oral Daily   polyethylene glycol  17 g Oral BID   senna-docusate  1 tablet Oral BID   cyanocobalamin  1,000 mcg Oral Daily      LOS: 13 days   Lonia Blood, MD [facility] Office  [phone] Pager - Text Page per Loretha Stapler  If 7PM-7AM, please contact night-coverage per Amion 10/18/2020, 10:37 AM

## 2020-10-18 NOTE — Discharge Summary (Addendum)
DISCHARGE SUMMARY  [name] [name]  MR#: [id number]  DOB:07/07/1926  Date of Admission: 10/04/2020 Date of Discharge: 10/19/2020  Attending Physician:Jayona Mccaig Silvestre Gunner, MD  Patient's NWG:[id number], Stann Mainland, MD  Consults: none   Disposition: D/C to SNF   Date of Positive COVID Test: 10/04/20  Date Isolation Ended: 10/15/20  COVID-19 specific Treatment: Remdesivir 9/26 > 9/30 Steroid 9/25 > 10/4  Follow-up Appts:  Contact information for after-discharge care     Destination     [facility] Preferred SNF .   Service: Skilled Nursing Contact information: [address] [place] [address] [phone]                     Issues to Follow: -The patient will need assistance with meals to assure her intake is maximized. -The patient should be encouraged to "push fluids" to avoid dehydration.  -Consider checking B12 level in 4-6 weeks.  Discharge Diagnoses: COVID Pneumonia UTI Acute metabolic encephalopathy Baseline dementia Leukopenia Mild thrombocytopenia Hyponatremia HTN Severe deconditioning  Initial presentation: 87yo who previously lived independently with a history of CHF, HTN, chronic venous stasis/lymphedema BLE, stage III CKD, and B12 deficiency anemia who presented to the ED with the acute onset of severe generalized weakness with decreased responsiveness.  The patient had a positive outpatient test for COVID.  CXR in the ED noted bibasilar opacities possibly suggestive of an atypical pneumonia.  Hospital Course:  COVID Pneumonia Was treated with Remdesivir and prednisone -clinically stabilized -now beyond her 10-day isolation period    UTI v/s Asymptomatic bacteriuria UA was sent simply for "odorous urine" - UA was abnormal but patient denied any symptoms to suggest an active UTI/cystitis - patient was noted to be mildly confused, and patient's change in mental status could be considered a symptom and  therefore I opted to treat her with a 3-day course of empiric antibiotic   Acute metabolic encephalopathy - baseline dementia Mental status waxes and wains - pt is typically alert and interactive and pleasant    Leukopenia Due to COVID -resolved   Mild thrombocytopenia Due to COVID -resolved   Hyponatremia Due to dehydration -resolved   HTN Blood pressure controlled   Severe deconditioning Patient will need a rehab stay at a skilled nursing facility to regain her mobility -she is clearly too weak to be safely discharged home at this time    Allergies as of 10/19/2020   No Known Allergies      Medication List     TAKE these medications    acetaminophen 500 MG tablet Commonly known as: TYLENOL Take 1 tablet (500 mg total) by mouth every 6 (six) hours as needed for mild pain or headache (fever >/= 101). What changed:  medication strength how much to take reasons to take this   cephALEXin 250 MG capsule Commonly known as: KEFLEX Take 1 capsule (250 mg total) by mouth every 12 (twelve) hours for 3 days.   cyanocobalamin 1000 MCG tablet Take 1 tablet (1,000 mcg total) by mouth daily.   polyethylene glycol 17 g packet Commonly known as: MIRALAX / GLYCOLAX Take 17 g by mouth 2 (two) times daily.   senna-docusate 8.6-50 MG tablet Commonly known as: Senokot-S Take 1 tablet by mouth 2 (two) times daily.   traZODone 50 MG tablet Commonly known as: DESYREL Take 0.5 tablets (25 mg total) by mouth at bedtime as needed for sleep.        Day of Discharge BP (!) 102/52 (BP Location:  Right Arm)   Pulse 64   Temp 97.6 F (36.4 C) (Oral)   Resp 18   Ht 5\' 6"  (1.676 m)   Wt 83.1 kg   SpO2 98%   BMI 29.55 kg/m   Physical Exam: General: No acute respiratory distress Lungs: Clear to auscultation bilaterally without wheezes or crackles Cardiovascular: Regular rate and rhythm without murmur gallop or rub normal S1 and S2 Abdomen: Nontender, nondistended, soft, bowel  sounds positive, no rebound, no ascites, no appreciable mass Extremities: No significant cyanosis, clubbing, or edema bilateral lower extremities  Basic Metabolic Panel: Recent Labs  Lab 10/18/20 1711  NA 135  K 4.6  CL 100  CO2 27  GLUCOSE 124*  BUN 32*  CREATININE 1.18*  CALCIUM 8.3*  MG 2.7*    CBC: Recent Labs  Lab 10/14/20 0506 10/18/20 1711  WBC 4.8 4.7  HGB 13.3 12.8  HCT 41.2 39.9  MCV 96.7 94.8  PLT 230 259    Time spent in discharge (includes decision making & examination of pt): 35 minutes  10/19/2020, 10:09 AM   12/19/2020, MD [facility]  [phone]

## 2020-10-19 DIAGNOSIS — R531 Weakness: Secondary | ICD-10-CM | POA: Diagnosis not present

## 2020-10-19 DIAGNOSIS — D518 Other vitamin B12 deficiency anemias: Secondary | ICD-10-CM | POA: Diagnosis not present

## 2020-10-19 DIAGNOSIS — U071 COVID-19: Secondary | ICD-10-CM | POA: Diagnosis not present

## 2020-10-19 DIAGNOSIS — J1282 Pneumonia due to coronavirus disease 2019: Secondary | ICD-10-CM | POA: Diagnosis not present

## 2020-10-19 MED ORDER — SENNOSIDES-DOCUSATE SODIUM 8.6-50 MG PO TABS: 1.0000 | ORAL_TABLET | Freq: Two times a day (BID) | ORAL | Status: DC

## 2020-10-19 MED ORDER — SODIUM CHLORIDE 0.9 % IV BOLUS: 250.0000 mL | Freq: Once | INTRAVENOUS | Status: DC

## 2020-10-19 MED ORDER — CEPHALEXIN 250 MG PO CAPS: 250.0000 mg | ORAL_CAPSULE | Freq: Two times a day (BID) | ORAL | 0 refills | Status: AC

## 2020-10-19 MED ORDER — SODIUM CHLORIDE 0.9 % IV BOLUS: 500.0000 mL | Freq: Once | INTRAVENOUS | Status: DC

## 2020-10-19 MED ORDER — POLYETHYLENE GLYCOL 3350 17 G PO PACK: 17.0000 g | PACK | Freq: Two times a day (BID) | ORAL | 0 refills | Status: DC

## 2020-10-19 MED ORDER — ACETAMINOPHEN 500 MG PO TABS: 500.0000 mg | ORAL_TABLET | Freq: Four times a day (QID) | ORAL | 0 refills | Status: DC | PRN

## 2020-10-19 NOTE — Progress Notes (Signed)
Report called and given to receiving nurse Morrie Sheldon. Pt to transport via EMS and will continue to monitor for any additional needs.

## 2020-10-19 NOTE — TOC Transition Note (Signed)
Transition of Care [facility]) - CM/SW Discharge Note   Patient Details  Name: [name] [name] MRN: [id number] Date of Birth: 10/27/1926  Transition of Care [facility]) CM/SW Contact:  Chapman Fitch, RN Phone Number: 10/19/2020, 1:23 PM   Clinical Narrative:     Patient will DC [id number] Anticipated DC date: 10/19/20  Family notified:Son Transport by:EMS  Per MD patient ready for DC to . RN, patient, patient's family, and facility notified of DC. Discharge Summary sent to facility. RN given number for report. DC packet on chart. Ambulance transport requested for patient.  TOC signing off.  Bevelyn Ngo [facility] [phone]         Patient Goals and CMS Choice        Discharge Placement                       Discharge Plan and Services                                     Social Determinants of Health (SDOH) Interventions     Readmission Risk Interventions No flowsheet data found.

## 2020-11-06 ENCOUNTER — Other Ambulatory Visit: Payer: Self-pay

## 2020-11-06 DIAGNOSIS — Z20822 Contact with and (suspected) exposure to covid-19: Secondary | ICD-10-CM | POA: Diagnosis present

## 2020-11-06 DIAGNOSIS — Z6825 Body mass index (BMI) 25.0-25.9, adult: Secondary | ICD-10-CM

## 2020-11-06 DIAGNOSIS — E440 Moderate protein-calorie malnutrition: Secondary | ICD-10-CM | POA: Diagnosis present

## 2020-11-06 DIAGNOSIS — Z8679 Personal history of other diseases of the circulatory system: Secondary | ICD-10-CM

## 2020-11-06 DIAGNOSIS — D519 Vitamin B12 deficiency anemia, unspecified: Secondary | ICD-10-CM | POA: Diagnosis present

## 2020-11-06 DIAGNOSIS — E860 Dehydration: Secondary | ICD-10-CM | POA: Diagnosis not present

## 2020-11-06 DIAGNOSIS — I890 Lymphedema, not elsewhere classified: Secondary | ICD-10-CM | POA: Diagnosis present

## 2020-11-06 DIAGNOSIS — N390 Urinary tract infection, site not specified: Secondary | ICD-10-CM | POA: Diagnosis not present

## 2020-11-06 DIAGNOSIS — Z66 Do not resuscitate: Secondary | ICD-10-CM | POA: Diagnosis present

## 2020-11-06 DIAGNOSIS — Z8249 Family history of ischemic heart disease and other diseases of the circulatory system: Secondary | ICD-10-CM

## 2020-11-06 DIAGNOSIS — N179 Acute kidney failure, unspecified: Secondary | ICD-10-CM | POA: Diagnosis present

## 2020-11-06 DIAGNOSIS — Z90710 Acquired absence of both cervix and uterus: Secondary | ICD-10-CM

## 2020-11-06 DIAGNOSIS — N183 Chronic kidney disease, stage 3 unspecified: Secondary | ICD-10-CM | POA: Diagnosis present

## 2020-11-06 DIAGNOSIS — F0390 Unspecified dementia without behavioral disturbance: Secondary | ICD-10-CM | POA: Diagnosis present

## 2020-11-06 DIAGNOSIS — E872 Acidosis, unspecified: Secondary | ICD-10-CM | POA: Diagnosis present

## 2020-11-06 DIAGNOSIS — I878 Other specified disorders of veins: Secondary | ICD-10-CM | POA: Diagnosis present

## 2020-11-06 DIAGNOSIS — Z833 Family history of diabetes mellitus: Secondary | ICD-10-CM

## 2020-11-06 DIAGNOSIS — L89151 Pressure ulcer of sacral region, stage 1: Secondary | ICD-10-CM | POA: Diagnosis present

## 2020-11-06 DIAGNOSIS — Z79899 Other long term (current) drug therapy: Secondary | ICD-10-CM

## 2020-11-06 DIAGNOSIS — Z8616 Personal history of COVID-19: Secondary | ICD-10-CM

## 2020-11-06 NOTE — ED Triage Notes (Signed)
Pt presents to ER via ems from home with c/o generalized weakness and decreased appetite for the last week.  Pt has been c/o not feeling good.  Per ems, pt has hx of UTI in the past, but is not having any urinary sx.  Pt A&O x2 at this time.

## 2020-11-07 ENCOUNTER — Emergency Department: Payer: Medicare HMO

## 2020-11-07 ENCOUNTER — Inpatient Hospital Stay
Admission: EM | Admit: 2020-11-07 | Discharge: 2020-11-11 | DRG: 641 | Disposition: A | Discharge status: Home / Self Care | Payer: Medicare HMO | Attending: Internal Medicine | Admitting: Internal Medicine

## 2020-11-07 ENCOUNTER — Inpatient Hospital Stay: Payer: Medicare HMO

## 2020-11-07 DIAGNOSIS — Z20822 Contact with and (suspected) exposure to covid-19: Secondary | ICD-10-CM | POA: Diagnosis present

## 2020-11-07 DIAGNOSIS — L89151 Pressure ulcer of sacral region, stage 1: Secondary | ICD-10-CM | POA: Diagnosis present

## 2020-11-07 DIAGNOSIS — Z6825 Body mass index (BMI) 25.0-25.9, adult: Secondary | ICD-10-CM | POA: Diagnosis not present

## 2020-11-07 DIAGNOSIS — N179 Acute kidney failure, unspecified: Secondary | ICD-10-CM | POA: Diagnosis present

## 2020-11-07 DIAGNOSIS — L8990 Pressure ulcer of unspecified site, unspecified stage: Secondary | ICD-10-CM | POA: Diagnosis present

## 2020-11-07 DIAGNOSIS — R531 Weakness: Secondary | ICD-10-CM | POA: Diagnosis not present

## 2020-11-07 DIAGNOSIS — E538 Deficiency of other specified B group vitamins: Secondary | ICD-10-CM | POA: Diagnosis not present

## 2020-11-07 DIAGNOSIS — E440 Moderate protein-calorie malnutrition: Secondary | ICD-10-CM | POA: Insufficient documentation

## 2020-11-07 DIAGNOSIS — E872 Acidosis, unspecified: Secondary | ICD-10-CM | POA: Diagnosis present

## 2020-11-07 DIAGNOSIS — N183 Chronic kidney disease, stage 3 unspecified: Secondary | ICD-10-CM | POA: Diagnosis present

## 2020-11-07 DIAGNOSIS — E860 Dehydration: Secondary | ICD-10-CM | POA: Diagnosis present

## 2020-11-07 DIAGNOSIS — F0390 Unspecified dementia without behavioral disturbance: Secondary | ICD-10-CM | POA: Diagnosis present

## 2020-11-07 DIAGNOSIS — I878 Other specified disorders of veins: Secondary | ICD-10-CM | POA: Diagnosis present

## 2020-11-07 DIAGNOSIS — Z8249 Family history of ischemic heart disease and other diseases of the circulatory system: Secondary | ICD-10-CM | POA: Diagnosis not present

## 2020-11-07 DIAGNOSIS — I890 Lymphedema, not elsewhere classified: Secondary | ICD-10-CM | POA: Diagnosis present

## 2020-11-07 DIAGNOSIS — N390 Urinary tract infection, site not specified: Secondary | ICD-10-CM

## 2020-11-07 DIAGNOSIS — Z8616 Personal history of COVID-19: Secondary | ICD-10-CM | POA: Diagnosis not present

## 2020-11-07 DIAGNOSIS — Z90710 Acquired absence of both cervix and uterus: Secondary | ICD-10-CM | POA: Diagnosis not present

## 2020-11-07 DIAGNOSIS — D519 Vitamin B12 deficiency anemia, unspecified: Secondary | ICD-10-CM | POA: Diagnosis present

## 2020-11-07 DIAGNOSIS — Z8679 Personal history of other diseases of the circulatory system: Secondary | ICD-10-CM | POA: Diagnosis not present

## 2020-11-07 DIAGNOSIS — Z833 Family history of diabetes mellitus: Secondary | ICD-10-CM | POA: Diagnosis not present

## 2020-11-07 DIAGNOSIS — Z66 Do not resuscitate: Secondary | ICD-10-CM | POA: Diagnosis present

## 2020-11-07 DIAGNOSIS — D72829 Elevated white blood cell count, unspecified: Secondary | ICD-10-CM

## 2020-11-07 DIAGNOSIS — F03B0 Unspecified dementia, moderate, without behavioral disturbance, psychotic disturbance, mood disturbance, and anxiety: Secondary | ICD-10-CM | POA: Diagnosis not present

## 2020-11-07 DIAGNOSIS — Z79899 Other long term (current) drug therapy: Secondary | ICD-10-CM | POA: Diagnosis not present

## 2020-11-07 LAB — URINALYSIS, COMPLETE (UACMP) WITH MICROSCOPIC
Bilirubin Urine: NEGATIVE
Glucose, UA: NEGATIVE mg/dL
Ketones, ur: NEGATIVE mg/dL
Nitrite: NEGATIVE
Protein, ur: 100 mg/dL — AB
Specific Gravity, Urine: 1.013 (ref 1.005–1.030)
WBC, UA: >50 WBC/hpf — ABNORMAL HIGH (ref 0–5)
pH: 5 (ref 5.0–8.0)

## 2020-11-07 LAB — COMPREHENSIVE METABOLIC PANEL
ALT: 31 U/L (ref 0–44)
AST: 51 U/L — ABNORMAL HIGH (ref 15–41)
Albumin: 2.9 g/dL — ABNORMAL LOW (ref 3.5–5.0)
Alkaline Phosphatase: 84 U/L (ref 38–126)
Anion gap: 17 — ABNORMAL HIGH (ref 5–15)
BUN: 78 mg/dL — ABNORMAL HIGH (ref 8–23)
CO2: 18 mmol/L — ABNORMAL LOW (ref 22–32)
Calcium: 9 mg/dL (ref 8.9–10.3)
Chloride: 104 mmol/L (ref 98–111)
Creatinine, Ser: 2.5 mg/dL — ABNORMAL HIGH (ref 0.44–1.00)
GFR, Estimated: 18 mL/min — ABNORMAL LOW (ref >60)
Glucose, Bld: 164 mg/dL — ABNORMAL HIGH (ref 70–99)
Potassium: 5.1 mmol/L (ref 3.5–5.1)
Sodium: 139 mmol/L (ref 135–145)
Total Bilirubin: 0.9 mg/dL (ref 0.3–1.2)
Total Protein: 7.6 g/dL (ref 6.5–8.1)

## 2020-11-07 LAB — PROCALCITONIN: Procalcitonin: 1.69 ng/mL

## 2020-11-07 LAB — CBC
HCT: 38.2 % (ref 36.0–46.0)
Hemoglobin: 11.7 g/dL — ABNORMAL LOW (ref 12.0–15.0)
MCH: 31 pg (ref 26.0–34.0)
MCHC: 30.6 g/dL (ref 30.0–36.0)
MCV: 101.3 fL — ABNORMAL HIGH (ref 80.0–100.0)
Platelets: 439 10*3/uL — ABNORMAL HIGH (ref 150–400)
RBC: 3.77 MIL/uL — ABNORMAL LOW (ref 3.87–5.11)
RDW: 14 % (ref 11.5–15.5)
WBC: 19.7 10*3/uL — ABNORMAL HIGH (ref 4.0–10.5)
nRBC: 0.2 % (ref 0.0–0.2)

## 2020-11-07 LAB — RESP PANEL BY RT-PCR (FLU A&B, COVID) ARPGX2
Influenza A by PCR: NEGATIVE
Influenza B by PCR: NEGATIVE
SARS Coronavirus 2 by RT PCR: NEGATIVE

## 2020-11-07 LAB — BASIC METABOLIC PANEL
Anion gap: 13 (ref 5–15)
BUN: 77 mg/dL — ABNORMAL HIGH (ref 8–23)
CO2: 19 mmol/L — ABNORMAL LOW (ref 22–32)
Calcium: 8.2 mg/dL — ABNORMAL LOW (ref 8.9–10.3)
Chloride: 106 mmol/L (ref 98–111)
Creatinine, Ser: 2.45 mg/dL — ABNORMAL HIGH (ref 0.44–1.00)
GFR, Estimated: 18 mL/min — ABNORMAL LOW (ref >60)
Glucose, Bld: 103 mg/dL — ABNORMAL HIGH (ref 70–99)
Potassium: 4.8 mmol/L (ref 3.5–5.1)
Sodium: 138 mmol/L (ref 135–145)

## 2020-11-07 LAB — FOLATE: Folate: 11.1 ng/mL (ref >5.9)

## 2020-11-07 LAB — LIPASE, BLOOD: Lipase: 27 U/L (ref 11–51)

## 2020-11-07 LAB — LACTIC ACID, PLASMA
Lactic Acid, Venous: 2.8 mmol/L (ref 0.5–1.9)
Lactic Acid, Venous: 1.4 mmol/L (ref 0.5–1.9)
Lactic Acid, Venous: 3.5 mmol/L (ref 0.5–1.9)
Lactic Acid, Venous: 3.8 mmol/L (ref 0.5–1.9)

## 2020-11-07 LAB — TROPONIN I (HIGH SENSITIVITY)
Troponin I (High Sensitivity): 63 ng/L — ABNORMAL HIGH (ref <18)
Troponin I (High Sensitivity): 64 ng/L — ABNORMAL HIGH (ref <18)

## 2020-11-07 MED ORDER — ACETAMINOPHEN 500 MG PO TABS: 500.0000 mg | ORAL_TABLET | Freq: Four times a day (QID) | ORAL | Status: DC | PRN

## 2020-11-07 MED ORDER — ONDANSETRON HCL 4 MG/2ML IJ SOLN: 4.0000 mg | Freq: Four times a day (QID) | INTRAMUSCULAR | Status: DC | PRN

## 2020-11-07 MED ORDER — TRAZODONE HCL 50 MG PO TABS
25.0000 mg | ORAL_TABLET | Freq: Every evening | ORAL | Status: DC | PRN
  Administered 2020-11-07: 21:00:00 25 mg via ORAL
  Filled 2020-11-07: qty 1

## 2020-11-07 MED ORDER — SODIUM CHLORIDE 0.9 % IV SOLN
1.0000 g | Freq: Once | INTRAVENOUS | Status: AC
  Administered 2020-11-07: 1 g via INTRAVENOUS
  Filled 2020-11-07: qty 10

## 2020-11-07 MED ORDER — SODIUM CHLORIDE 0.9 % IV BOLUS (SEPSIS)
1000.0000 mL | Freq: Once | INTRAVENOUS | Status: AC
  Administered 2020-11-07: 1000 mL via INTRAVENOUS

## 2020-11-07 MED ORDER — ENOXAPARIN SODIUM 30 MG/0.3ML IJ SOSY
30.0000 mg | PREFILLED_SYRINGE | INTRAMUSCULAR | Status: DC
  Administered 2020-11-07 – 2020-11-08 (×2): 30 mg via SUBCUTANEOUS
  Filled 2020-11-07 (×2): qty 0.3

## 2020-11-07 MED ORDER — STERILE WATER FOR INJECTION IV SOLN
INTRAVENOUS | Status: DC
  Filled 2020-11-07: qty 1000
  Filled 2020-11-07 (×2): qty 150
  Filled 2020-11-07 (×2): qty 1000
  Filled 2020-11-07 (×2): qty 150
  Filled 2020-11-07 (×5): qty 1000
  Filled 2020-11-07: qty 150
  Filled 2020-11-07 (×2): qty 1000

## 2020-11-07 MED ORDER — SODIUM CHLORIDE 0.9 % IV SOLN
1.0000 g | INTRAVENOUS | Status: DC
  Administered 2020-11-08: 11:00:00 1 g via INTRAVENOUS
  Filled 2020-11-07: qty 10
  Filled 2020-11-07: qty 1

## 2020-11-07 MED ORDER — LACTATED RINGERS IV SOLN: INTRAVENOUS | Status: DC

## 2020-11-07 MED ORDER — SODIUM CHLORIDE 0.9 % IV SOLN: INTRAVENOUS | Status: AC

## 2020-11-07 MED ORDER — HALOPERIDOL LACTATE 5 MG/ML IJ SOLN
1.0000 mg | Freq: Four times a day (QID) | INTRAMUSCULAR | Status: DC | PRN
  Administered 2020-11-08: 02:00:00 1 mg via INTRAVENOUS
  Filled 2020-11-07: qty 1

## 2020-11-07 MED ORDER — LACTATED RINGERS IV BOLUS
500.0000 mL | Freq: Once | INTRAVENOUS | Status: AC
  Administered 2020-11-07: 21:00:00 500 mL via INTRAVENOUS

## 2020-11-07 MED ORDER — ONDANSETRON HCL 4 MG PO TABS: 4.0000 mg | ORAL_TABLET | Freq: Four times a day (QID) | ORAL | Status: DC | PRN

## 2020-11-07 MED ORDER — VITAMIN B-12 1000 MCG PO TABS
1000.0000 ug | ORAL_TABLET | Freq: Every day | ORAL | Status: DC
  Administered 2020-11-08 – 2020-11-11 (×4): 1000 ug via ORAL
  Filled 2020-11-07 (×5): qty 1

## 2020-11-07 MED ORDER — POLYETHYLENE GLYCOL 3350 17 G PO PACK
17.0000 g | PACK | Freq: Two times a day (BID) | ORAL | Status: DC
  Administered 2020-11-07 – 2020-11-11 (×8): 17 g via ORAL
  Filled 2020-11-07 (×7): qty 1

## 2020-11-07 NOTE — H&P (Addendum)
History and Physical    [name] [name] TSV:[id number] DOB: Jun 21, 1926 DOA: 11/07/2020  PCP: Mick Sell, MD   Patient coming from: Home  I have personally briefly reviewed patient's old medical records in [facility]  Chief Complaint: Generalized weakness   Most of the history was obtained from ER records as well as from patient's [name] over the phone.  Patient appears to be confused  HPI: [name] [name] is a 87 y.o. female with medical history significant for CHF, hypertension, chronic venous stasis of both lower extremities and lymphedema, stage III chronic kidney disease and vitamin B12 deficiency anemia who was brought into the ER by EMS for evaluation of generalized weakness poor oral intake. She was recently hospitalized from 09/26 - 10/10 for COVID-pneumonia and severe deconditioning and required subacute rehab in a skilled nursing facility. Per patient's son, she was discharged from the rehab facility about a week ago and has been at home for 1 week. He states that her oral intake has been very poor but he is unable to tell me if she requires assistance to feed herself. He also states that she complained of generalized body aches and pains. I am unable to do review of systems on this patient due to her mental status. Labs show sodium 139, potassium 5.1, chloride 104, bicarb 18, glucose 164, BUN 78 compared to baseline of 32, creatinine 2.50 compared to baseline of 1.18, calcium 9.0, alkaline phosphatase 84, albumin 2.9, lipase 27, AST 51, ALT 31, total protein 7.6, troponin 63, lactic acid 2.8, procalcitonin 1.69, white count 19.7, hemoglobin 11.7, hematocrit 38.2, MCV 101.3, RDW 14.0, platelet count 439 Respiratory viral panel is negative CT scan of the head without contrast shows no acute intracranial findings.  Atrophy and mild white matter microvascular disease.  Mild hydrocephalus unchanged. Chest x-ray reviewed by me shows chronic appearing streaky  pulmonary opacities unchanged from 10/04/2020.  Lower lobe bronchiectasis noted on CT scan from 08/01/2020.  No acute findings.   ED Course: Patient is a 87 year old female who was sent to the ER via EMS by her son for evaluation of generalized pain and poor oral intake. Patient noted to have an acute kidney injury, her baseline serum creatinine is 1.18 and on admission today it is 2.50.  BUN is also elevated at 78 compared to baseline of 32. Patient was recently discharged from a skilled nursing facility where she went for subacute rehab. She will be admitted to the hospital for further evaluation.   Review of Systems: As per HPI otherwise all other systems reviewed and negative.    Past Medical History:  Diagnosis Date   Cellulitis 10/28/2016   bilateral lower extremities.     Past Surgical History:  Procedure Laterality Date   ABDOMINAL HYSTERECTOMY     GANGLION CYST EXCISION       reports that she has never smoked. She has never used smokeless tobacco. She reports that she does not drink alcohol and does not use drugs.  No Known Allergies  Family History  Problem Relation Age of Onset   Diabetes Mellitus II Mother    Heart disease Father       Prior to Admission medications   Medication Sig Start Date End Date Taking? Authorizing Provider  acetaminophen (TYLENOL) 500 MG tablet Take 1 tablet (500 mg total) by mouth every 6 (six) hours as needed for mild pain or headache (fever >/= 101). 10/19/20   Lonia Blood, MD  polyethylene glycol (MIRALAX / Ethelene Hal)  17 g packet Take 17 g by mouth 2 (two) times daily. 10/19/20   Lonia Blood, MD  senna-docusate (SENOKOT-S) 8.6-50 MG tablet Take 1 tablet by mouth 2 (two) times daily. 10/19/20   Lonia Blood, MD  traZODone (DESYREL) 50 MG tablet Take 0.5 tablets (25 mg total) by mouth at bedtime as needed for sleep. 01/20/20   Alford Highland, MD  vitamin B-12 1000 MCG tablet Take 1 tablet (1,000 mcg total) by mouth  daily. 01/21/20   Alford Highland, MD    Physical Exam: Vitals:   11/07/20 0700 11/07/20 0730 11/07/20 0800 11/07/20 0904  BP: 105/68 116/65 (!) 112/56 103/60  Pulse: 75 70 73 66  Resp: 18 16 16 14   Temp:      TempSrc:      SpO2: 95% 95% 96% 94%     Vitals:   11/07/20 0700 11/07/20 0730 11/07/20 0800 11/07/20 0904  BP: 105/68 116/65 (!) 112/56 103/60  Pulse: 75 70 73 66  Resp: 18 16 16 14   Temp:      TempSrc:      SpO2: 95% 95% 96% 94%      Constitutional: Alert and oriented only to person.  Not to place or time . Not in any apparent distress HEENT:      Head: Normocephalic and atraumatic.         Eyes: PERLA, EOMI, Conjunctivae are normal. Sclera is non-icteric.       Mouth/Throat: Mucous membranes are dry .       Neck: Supple with no signs of meningismus. Cardiovascular: Regular rate and rhythm. No murmurs, gallops, or rubs. 2+ symmetrical distal pulses are present . No JVD. No LE edema Respiratory: Respiratory effort normal .Lungs sounds clear bilaterally. No wheezes, crackles, or rhonchi.  Gastrointestinal: Soft, non tender, and non distended with positive bowel sounds.  Genitourinary: No CVA tenderness. Musculoskeletal: Nontender with normal range of motion in all extremities. No cyanosis, or erythema of extremities. Neurologic:  Face is symmetric. Moving all extremities. No gross focal neurologic deficits . Skin: Skin is warm, dry.  No rash or ulcers, poor skin turgor Psychiatric: Mood and affect are normal    Labs on Admission: I have personally reviewed following labs and imaging studies  CBC: Recent Labs  Lab 11/07/20 0126  WBC 19.7*  HGB 11.7*  HCT 38.2  MCV 101.3*  PLT 439*   Basic Metabolic Panel: Recent Labs  Lab 11/07/20 0126  NA 139  K 5.1  CL 104  CO2 18*  GLUCOSE 164*  BUN 78*  CREATININE 2.50*  CALCIUM 9.0   GFR: CrCl cannot be calculated (Unknown ideal weight.). Liver Function Tests: Recent Labs  Lab 11/07/20 0126  AST 51*   ALT 31  ALKPHOS 84  BILITOT 0.9  PROT 7.6  ALBUMIN 2.9*   Recent Labs  Lab 11/07/20 0126  LIPASE 27   No results for input(s): AMMONIA in the last 168 hours. Coagulation Profile: No results for input(s): INR, PROTIME in the last 168 hours. Cardiac Enzymes: No results for input(s): CKTOTAL, CKMB, CKMBINDEX, TROPONINI in the last 168 hours. BNP (last 3 results) No results for input(s): PROBNP in the last 8760 hours. HbA1C: No results for input(s): HGBA1C in the last 72 hours. CBG: No results for input(s): GLUCAP in the last 168 hours. Lipid Profile: No results for input(s): CHOL, HDL, LDLCALC, TRIG, CHOLHDL, LDLDIRECT in the last 72 hours. Thyroid Function Tests: No results for input(s): TSH, T4TOTAL, FREET4, T3FREE, THYROIDAB in the  last 72 hours. Anemia Panel: No results for input(s): VITAMINB12, FOLATE, FERRITIN, TIBC, IRON, RETICCTPCT in the last 72 hours. Urine analysis:    Component Value Date/Time   COLORURINE AMBER (A) 11/07/2020 0400   APPEARANCEUR TURBID (A) 11/07/2020 0400   LABSPEC 1.013 11/07/2020 0400   PHURINE 5.0 11/07/2020 0400   GLUCOSEU NEGATIVE 11/07/2020 0400   HGBUR SMALL (A) 11/07/2020 0400   BILIRUBINUR NEGATIVE 11/07/2020 0400   KETONESUR NEGATIVE 11/07/2020 0400   PROTEINUR 100 (A) 11/07/2020 0400   NITRITE NEGATIVE 11/07/2020 0400   LEUKOCYTESUR LARGE (A) 11/07/2020 0400    Radiological Exams on Admission: CT HEAD WO CONTRAST ( )  Result Date: 11/07/2020 CLINICAL DATA:  Pt presents to ER via ems from home with c/o generalized weakness and decreased appetite for the last week. Pt has been c/o not feeling goodMental status change, unknown cause EXAM: CT HEAD WITHOUT CONTRAST TECHNIQUE: Contiguous axial images were obtained from the base of the skull through the vertex without intravenous contrast. COMPARISON:  04/06/2020 FINDINGS: Brain: No acute intracranial hemorrhage. No focal mass lesion. No CT evidence of acute infarction. No midline  shift or mass effect. Mild hydrocephalus unchanged from comparison. Basilar cisterns are patent. There are periventricular and subcortical white matter hypodensities. Generalized cortical atrophy. Vascular: No hyperdense vessel or unexpected calcification. Skull: Normal. Negative for fracture or focal lesion. Sinuses/Orbits: Paranasal sinuses and mastoid air cells are clear. Orbits are clear. Other: None. IMPRESSION: 1. No acute intracranial findings. 2. Atrophy and mild white matter microvascular disease. 3. Mild hydrocephalus unchanged Electronically Signed   By: Genevive Bi M.D.   On: 11/07/2020 05:46   DG Chest Port 1 View  Result Date: 11/07/2020 CLINICAL DATA:  Altered mental status, leukocytosis EXAM: PORTABLE CHEST 1 VIEW COMPARISON:  10/04/2020 FINDINGS: Normal cardiac silhouette. Persistent bilateral streaky opacities unchanged from comparison exam. No focal consolidation. No pleural fluid no pneumothorax. IMPRESSION: Chronic appearing streaky pulmonary opacities not changed from 10/04/2020. Lower lobe bronchiectasis noted on CT 08/01/2020. No acute findings Electronically Signed   By: Genevive Bi M.D.   On: 11/07/2020 05:51     Assessment/Plan Principal Problem:   AKI (acute kidney injury) (HCC) Active Problems:   General weakness   B12 deficiency   Dementia (HCC)   Lactic acidosis     Patient is a 87 year old female who presents to the ER for evaluation of generalized body aches and poor oral intake.   Acute kidney injury Most likely prerenal secondary to poor oral intake Patient noted to have dry mucous membranes and poor skin turgor Her baseline serum creatinine is about 1.18 and today on admission it is 2.50 with elevated BUN Aggressive IV fluid hydration Patient will need to be assisted with meals Repeat renal parameters in a.m.    Dementia Patient does not have a documented history of dementia but is very confused and is only oriented to person and per her  son (she is confused at baseline).  She requires assistance with ADLs including grooming and meals. CT scan of the head shows evidence of mild hydrocephalus   Lactic acidosis Unclear etiology Initial lactate was 2.8 and procalcitonin is elevated at 1.6.  She also has leukocytosis Patient is afebrile and does not have tachycardia or tachypnea No obvious source of infection at this time Still awaiting results of urine analysis Continue IV fluid hydration Will obtain CT scan of the chest without contrast for further evaluation We will trend lactic acid levels   DVT prophylaxis: Lovenox  Code Status:  DNR  Family Communication: Greater than 50% of time was spent discussing patient's condition and plan of care with her [name] over the phone.  All questions and concerns have been addressed.  He verbalizes understanding and agrees to the plan.  CODE STATUS was discussed and she is a DNR. Disposition Plan: Back to previous home environment Consults called: none  Status:At the time of admission, it appears that the appropriate admission status for this patient is inpatient. This is judged to be reasonable and necessary to provide the required intensity of service to ensure the patient's safety given the presenting symptoms, physical exam findings, and initial radiographic and laboratory data in the context of their comorbid conditions. Patient requires inpatient status due to high intensity of service, high risk for further deterioration and high frequency of surveillance required.     Lucile Shutters MD [facility]     11/07/2020, 9:35 AM

## 2020-11-07 NOTE — ED Notes (Signed)
Informed RN bed assigned 

## 2020-11-07 NOTE — Progress Notes (Signed)
Critical lactic called 3.5 paged on call brenda see new orders

## 2020-11-07 NOTE — ED Notes (Signed)
Lab contacted to add on additional orders to previous lab collection.

## 2020-11-07 NOTE — Progress Notes (Signed)
UA has resulted and patient has significant pyuria which explains her leukocytosis, elevated lactic acid level as well as procalcitonin level. We will start patient on Rocephin 1 g IV daily Follow-up results of urine culture Trend lactic acid levels

## 2020-11-07 NOTE — ED Provider Notes (Addendum)
[facility] Emergency Department Provider Note  ____________________________________________   Event Date/Time   First MD Initiated Contact with Patient 11/07/20 0411     (approximate)  I have reviewed the triage vital signs and the nursing notes.   HISTORY  Chief Complaint Weakness    HPI [name] [name] is a 87 y.o. female with history of CHF, hypertension, chronic kidney disease, anemia who presents to the emergency department from home with complaints of pain all over.  Patient is unable to provide much history.  She tells me she is here because her her legs.  She is unable to explain this further.  Spoke to her [name] by phone he states that she was complaining of pain all over today.  He states that she lives at home alone but has aides that stay with her part of the day.  He states that she is confused at baseline but does not have a known history of dementia.  He is not aware of any falls.  No fevers, coughing, vomiting or diarrhea.    On review of her records, patient was just admitted to the hospital on 10/04/2020 -10/19/2020 for COVID-pneumonia.    Past Medical History:  Diagnosis Date   Cellulitis 10/28/2016   bilateral lower extremities.     Patient Active Problem List   Diagnosis Date Noted   Superficial bruising of arm, left, initial encounter    Acute CHF (congestive heart failure) (HCC) 04/06/2020   HTN (hypertension), malignant    Lymphedema    Fall    Stage 3b chronic kidney disease (HCC)    B12 deficiency    Anemia due to vitamin B12 deficiency    Acute metabolic encephalopathy    General weakness    Confusion    Acute lower UTI 01/07/2020   E. coli UTI 01/07/2020   Chronic venous stasis dermatitis of both lower extremities 01/07/2020   Cellulitis 03/06/2017   Left leg cellulitis 11/25/2016   AKI (acute kidney injury) (HCC) 11/05/2016   Decubitus ulcer of ischium, stage 2, left (HCC) 11/05/2016   Troponin I above  reference range 11/04/2016    Past Surgical History:  Procedure Laterality Date   ABDOMINAL HYSTERECTOMY     GANGLION CYST EXCISION      Prior to Admission medications   Medication Sig Start Date End Date Taking? Authorizing Provider  acetaminophen (TYLENOL) 500 MG tablet Take 1 tablet (500 mg total) by mouth every 6 (six) hours as needed for mild pain or headache (fever >/= 101). 10/19/20   Lonia Blood, MD  polyethylene glycol (MIRALAX / GLYCOLAX) 17 g packet Take 17 g by mouth 2 (two) times daily. 10/19/20   Lonia Blood, MD  senna-docusate (SENOKOT-S) 8.6-50 MG tablet Take 1 tablet by mouth 2 (two) times daily. 10/19/20   Lonia Blood, MD  traZODone (DESYREL) 50 MG tablet Take 0.5 tablets (25 mg total) by mouth at bedtime as needed for sleep. 01/20/20   Alford Highland, MD  vitamin B-12 1000 MCG tablet Take 1 tablet (1,000 mcg total) by mouth daily. 01/21/20   Alford Highland, MD    Allergies Patient has no known allergies.  Family History  Problem Relation Age of Onset   Diabetes Mellitus II Mother    Heart disease Father     Social History Social History   Tobacco Use   Smoking status: Never   Smokeless tobacco: Never  Substance Use Topics   Alcohol use: No   Drug use:  No    Review of Systems Level 5 caveat secondary to confusion  ____________________________________________   PHYSICAL EXAM:  VITAL SIGNS: ED Triage Vitals  Enc Vitals Group     BP 11/07/20 0000 98/62     Pulse Rate 11/07/20 0000 96     Resp 11/07/20 0000 20     Temp 11/07/20 0000 (!) 97.5 F (36.4 C)     Temp Source 11/07/20 0000 Oral     SpO2 11/07/20 0402 97 %     Weight --      Height --      Head Circumference --      Peak Flow --      Pain Score 11/06/20 2354 0     Pain Loc --      Pain Edu? --      Excl. in GC? --    CONSTITUTIONAL: Alert and oriented to person but not place, time or situation.  Elderly. HEAD: Normocephalic, atraumatic EYES: Conjunctivae  clear, pupils appear equal, EOM appear intact ENT: normal nose; dry mucous membranes NECK: Supple, normal ROM CARD: RRR; S1 and S2 appreciated; no murmurs, no clicks, no rubs, no gallops RESP: Normal chest excursion without splinting or tachypnea; breath sounds clear and equal bilaterally; no wheezes, no rhonchi, no rales, no hypoxia or respiratory distress, speaking full sentences ABD/GI: Normal bowel sounds; non-distended; soft, non-tender, no rebound, no guarding, no peritoneal signs, no hepatosplenomegaly BACK: The back appears normal EXT: Normal ROM in all joints; no deformity noted, no edema; no cyanosis SKIN: Normal color for age and race; warm; no rash on exposed skin NEURO: Moves all extremities equally, normal speech, no facial asymmetry PSYCH: The patient's mood and manner are appropriate.  ____________________________________________   LABS (all labs ordered are listed, but only abnormal results are displayed)  Labs Reviewed  COMPREHENSIVE METABOLIC PANEL - Abnormal; Notable for the following components:      Result Value   CO2 18 (*)    Glucose, Bld 164 (*)    BUN 78 (*)    Creatinine, Ser 2.50 (*)    Albumin 2.9 (*)    AST 51 (*)    GFR, Estimated 18 (*)    Anion gap 17 (*)    All other components within normal limits  CBC - Abnormal; Notable for the following components:   WBC 19.7 (*)    RBC 3.77 (*)    Hemoglobin 11.7 (*)    MCV 101.3 (*)    Platelets 439 (*)    All other components within normal limits  TROPONIN I (HIGH SENSITIVITY) - Abnormal; Notable for the following components:   Troponin I (High Sensitivity) 63 (*)    All other components within normal limits  RESP PANEL BY RT-PCR (FLU A&B, COVID) [id number]  URINE CULTURE  CULTURE, BLOOD (ROUTINE X 2)  CULTURE, BLOOD (ROUTINE X 2)  LIPASE, BLOOD  URINALYSIS, COMPLETE (UACMP) WITH MICROSCOPIC  PROCALCITONIN  LACTIC ACID, PLASMA  TROPONIN I (HIGH SENSITIVITY)    ____________________________________________  EKG   EKG Interpretation  Date/Time:  Saturday November 07 2020 03:52:01 EDT Ventricular Rate:  85 PR Interval:  154 QRS Duration: 104 QT Interval:  356 QTC Calculation: 424 R Axis:   54 Text Interpretation: Sinus rhythm Nonspecific repol abnormality, diffuse leads Confirmed by Rochele Raring [phone]) on 11/07/2020 4:30:38 AM        ____________________________________________  RADIOLOGY Normajean Baxter Coleen Cardiff, personally viewed and evaluated these images (plain radiographs) as part of my medical decision making, as  well as reviewing the written report by the radiologist.  ED MD interpretation: CT head shows no acute abnormality.  Chest x-ray shows stable bilateral streaky pulmonary opacities.  Official radiology report(s): CT HEAD WO CONTRAST ( )  Result Date: 11/07/2020 CLINICAL DATA:  Pt presents to ER via ems from home with c/o generalized weakness and decreased appetite for the last week. Pt has been c/o not feeling goodMental status change, unknown cause EXAM: CT HEAD WITHOUT CONTRAST TECHNIQUE: Contiguous axial images were obtained from the base of the skull through the vertex without intravenous contrast. COMPARISON:  04/06/2020 FINDINGS: Brain: No acute intracranial hemorrhage. No focal mass lesion. No CT evidence of acute infarction. No midline shift or mass effect. Mild hydrocephalus unchanged from comparison. Basilar cisterns are patent. There are periventricular and subcortical white matter hypodensities. Generalized cortical atrophy. Vascular: No hyperdense vessel or unexpected calcification. Skull: Normal. Negative for fracture or focal lesion. Sinuses/Orbits: Paranasal sinuses and mastoid air cells are clear. Orbits are clear. Other: None. IMPRESSION: 1. No acute intracranial findings. 2. Atrophy and mild white matter microvascular disease. 3. Mild hydrocephalus unchanged Electronically Signed   By: Genevive Bi M.D.   On:  11/07/2020 05:46   DG Chest Port 1 View  Result Date: 11/07/2020 CLINICAL DATA:  Altered mental status, leukocytosis EXAM: PORTABLE CHEST 1 VIEW COMPARISON:  10/04/2020 FINDINGS: Normal cardiac silhouette. Persistent bilateral streaky opacities unchanged from comparison exam. No focal consolidation. No pleural fluid no pneumothorax. IMPRESSION: Chronic appearing streaky pulmonary opacities not changed from 10/04/2020. Lower lobe bronchiectasis noted on CT 08/01/2020. No acute findings Electronically Signed   By: Genevive Bi M.D.   On: 11/07/2020 05:51    ____________________________________________   PROCEDURES  Procedure(s) performed (including Critical Care):  Procedures   ____________________________________________   INITIAL IMPRESSION / ASSESSMENT AND PLAN / ED COURSE  As part of my medical decision making, I reviewed the following data within the electronic MEDICAL RECORD NUMBER History obtained from family, Nursing notes reviewed and incorporated, Labs reviewed , EKG interpreted , Old EKG reviewed, Old chart reviewed, Radiograph reviewed , Discussed with admitting physician , CT reviewed, and Notes from prior ED visits         Patient here with complaints of pain all over.  Unable to provide much history here.  Spoke to son over the phone who states that she was complaining of pain and its why they sent her to the ER.  She is quite confused here which son states is normal for her although she has no documented history of dementia.  Her labs were obtained in triage and show that she has an elevated creatinine compared to her most previous.  She could have some component of uremic encephalopathy.  She does appear dry on exam with dry mucous membranes and initial blood pressure was soft but recheck has improved.  We will start IV hydration.  I suspect that her AKI is prerenal.  Rectal temperature is normal.  She does have a leukocytosis but no obvious infectious symptoms.  Will obtain  chest x-ray, urinalysis to evaluate for signs of infection.  Will obtain CT of the head to evaluate for CVA, intracranial hemorrhage.  Will give IV fluids.  She will need admission given her AKI.  ED PROGRESS  Chest x-ray shows stable streaky opacities similar compared to imaging on 10/04/2020 when she had COVID-19 pneumonia.  We will add on procalcitonin.  COVID and flu swabs pending.  Blood cultures, lactic pending as well.  We were able  to obtain a catheterized urine sample but she only had a very small amount of urine that was thick and cloudy in appearance.  Will likely need to attempt to catheter again once she is better hydrated.  CT head shows no acute abnormality.  Troponin minimally elevated but this appears to be her baseline and EKG shows nonspecific diffuse repolarization abnormalities.  She denies any chest pain or shortness of breath.  Will discuss with hospitalist for admission.   6:23 AM Discussed patient's case with hospitalist, Dr. Para March.  I have recommended admission and patient (and family if present) agree with this plan. Admitting physician will place admission orders.   I reviewed all nursing notes, vitals, pertinent previous records and reviewed/interpreted all EKGs, lab and urine results, imaging (as available).   7:13 AM  Pt's urine appears infected.  Culture is pending.  Last 2 urine cultures have grown E. coli that was sensitive to cephalosporins.  Will give IV Rocephin.  Patient continues to be hemodynamically stable but still has only very minimal urine output.  Will give second liter of IV fluids.  Patient does have grade 2 diastolic dysfunction but has EF of 60 to 65%. ____________________________________________   FINAL CLINICAL IMPRESSION(S) / ED DIAGNOSES  Final diagnoses:  AKI (acute kidney injury) (HCC)  Leukocytosis, unspecified type     ED Discharge Orders     None       *Please note:  Milyn Stapleton was evaluated in Emergency Department  on 11/07/2020 for the symptoms described in the history of present illness. She was evaluated in the context of the global COVID-19 pandemic, which necessitated consideration that the patient might be at risk for infection with the SARS-CoV-2 virus that causes COVID-19. Institutional protocols and algorithms that pertain to the evaluation of patients at risk for COVID-19 are in a state of rapid change based on information released by regulatory bodies including the CDC and federal and state organizations. These policies and algorithms were followed during the patient's care in the ED.  Some ED evaluations and interventions may be delayed as a result of limited staffing during and the pandemic.*   Note:  This document was prepared using Dragon voice recognition software and may include unintentional dictation errors.    Solimar Maiden, Layla Maw, DO 11/07/20 6063    Kamyiah Colantonio, Layla Maw, DO 11/07/20 0160

## 2020-11-07 NOTE — Progress Notes (Signed)
A  STAT consult was placed to the IV Therapist for an iv start with blood draw;  pt currently has an iv and is getting fluids and an antibiotic;  Lab has been unable to obtain her blood;  Have spoken with 2 RNs, and MD;  will need an order to place a 2nd IV access if  it is necessary, but explained that IV Team doesn't routinely just draw labs; pt has iv access for her needs currently;

## 2020-11-07 NOTE — ED Notes (Signed)
Pt verbally abusive to this RN and other staff members. When attempting in and out catheterization (this RN, Pharmacist, hospital, and Statistician) pt inconsistently violent and abusive.

## 2020-11-08 ENCOUNTER — Inpatient Hospital Stay: Payer: Medicare HMO

## 2020-11-08 DIAGNOSIS — R531 Weakness: Secondary | ICD-10-CM

## 2020-11-08 DIAGNOSIS — L8990 Pressure ulcer of unspecified site, unspecified stage: Secondary | ICD-10-CM | POA: Diagnosis present

## 2020-11-08 DIAGNOSIS — E538 Deficiency of other specified B group vitamins: Secondary | ICD-10-CM

## 2020-11-08 DIAGNOSIS — F03B0 Unspecified dementia, moderate, without behavioral disturbance, psychotic disturbance, mood disturbance, and anxiety: Secondary | ICD-10-CM

## 2020-11-08 LAB — BLOOD CULTURE ID PANEL (REFLEXED) - BCID2

## 2020-11-08 LAB — CBC
HCT: 29.1 % — ABNORMAL LOW (ref 36.0–46.0)
Hemoglobin: 9 g/dL — ABNORMAL LOW (ref 12.0–15.0)
MCH: 29.7 pg (ref 26.0–34.0)
MCHC: 30.9 g/dL (ref 30.0–36.0)
MCV: 96 fL (ref 80.0–100.0)
Platelets: 335 10*3/uL (ref 150–400)
RBC: 3.03 MIL/uL — ABNORMAL LOW (ref 3.87–5.11)
RDW: 14.4 % (ref 11.5–15.5)
WBC: 12.3 10*3/uL — ABNORMAL HIGH (ref 4.0–10.5)
nRBC: 0.2 % (ref 0.0–0.2)

## 2020-11-08 LAB — MAGNESIUM: Magnesium: 2.4 mg/dL (ref 1.7–2.4)

## 2020-11-08 LAB — BASIC METABOLIC PANEL
Anion gap: 10 (ref 5–15)
BUN: 79 mg/dL — ABNORMAL HIGH (ref 8–23)
CO2: 23 mmol/L (ref 22–32)
Calcium: 8.3 mg/dL — ABNORMAL LOW (ref 8.9–10.3)
Chloride: 107 mmol/L (ref 98–111)
Creatinine, Ser: 2.12 mg/dL — ABNORMAL HIGH (ref 0.44–1.00)
GFR, Estimated: 21 mL/min — ABNORMAL LOW (ref >60)
Glucose, Bld: 66 mg/dL — ABNORMAL LOW (ref 70–99)
Potassium: 3.8 mmol/L (ref 3.5–5.1)
Sodium: 140 mmol/L (ref 135–145)

## 2020-11-08 LAB — RPR: RPR Ser Ql: NONREACTIVE

## 2020-11-08 LAB — LACTIC ACID, PLASMA
Lactic Acid, Venous: 1.5 mmol/L (ref 0.5–1.9)
Lactic Acid, Venous: 1.8 mmol/L (ref 0.5–1.9)

## 2020-11-08 MED ORDER — SODIUM CHLORIDE 0.9 % IV SOLN
2.0000 g | INTRAVENOUS | Status: DC
  Administered 2020-11-09 – 2020-11-10 (×2): 2 g via INTRAVENOUS
  Filled 2020-11-08 (×2): qty 2
  Filled 2020-11-08: qty 20

## 2020-11-08 NOTE — Assessment & Plan Note (Signed)
See nursing documentation.  Agree with their assessment.  Dressing changes as ordered

## 2020-11-08 NOTE — Progress Notes (Signed)
  Progress Note    [name] [name]   [id number]  DOB: July 16, 1926  DOA: 11/07/2020     1 Date of Service: 11/08/2020   Clinical Course  87 y.o. female with medical history significant for CHF, hypertension, chronic venous stasis of both lower extremities and lymphedema, stage III chronic kidney disease and vitamin B12 deficiency anemia who was brought into the ER by EMS for evaluation of generalized weakness poor oral intake. She was recently hospitalized from 09/26 - 10/10 for COVID-pneumonia and severe deconditioning and required subacute rehab in a skilled nursing facility.  10/30: AKI improving.  On antibiotic for possible UTI   Assessment and Plan * AKI (acute kidney injury) (HCC) Likely prerenal from dehydration from poor p.o. intake.  Improving with hydration.  Encouraged oral nutrition  Pressure injury of skin See nursing documentation.  Agree with their assessment.  Dressing changes as ordered  Lactic acidosis Improving with hydration.  Likely due to dehydration and mild UTI  Dementia (HCC) At baseline.  Recommend palliative care eval as an outpatient  General weakness Multifactorial.  Recommend home health PT, OT at discharge and palliative care to follow     Subjective:  Eating breakfast.  Feeling better  Objective Vitals:   11/07/20 1937 11/07/20 2106 11/08/20 0526 11/08/20 0805  BP:  (!) 106/56 116/60 (!) 117/44  Pulse:  61 70 77  Resp:  17 18 17   Temp:  (!) 97.3 F (36.3 C) (!) 97.4 F (36.3 C) 97.7 F (36.5 C)  TempSrc:      SpO2:      Weight: 72.4 kg     Height: 5\' 6"  (1.676 m)      72.4 kg  Vital signs were reviewed and unremarkable.   Exam Physical Exam   Constitutional:Alert and oriented only to person.  Not to place or time . Not in any apparent distress HEENT: Head:Normocephalic and atraumatic.  Eyes:PERLA, EOMI, Conjunctivae are normal. Sclera is non-icteric.  Mouth/Throat:Mucous membranes are dry .   Neck:Supple with no signs of meningismus. Cardiovascular:Regular rate and rhythm. No murmurs, gallops, or rubs. 2+ symmetrical distal pulses are present . No JVD. No LE edema Respiratory:Respiratory effort normal .Lungs sounds clear bilaterally. No wheezes, crackles, or rhonchi.  Gastrointestinal:Soft, non tender, and non distended with positive bowel sounds.  Genitourinary:No CVA tenderness. Musculoskeletal:Nontender with normal range of motion in all extremities. No cyanosis, or erythema of extremities. Neurologic:  Face is symmetric. Moving all extremities. No gross focal neurologic deficits . Skin:Skin is warm, dry.  No rash or ulcers, poor skin turgor Psychiatric: Mood and affect are normal   Labs / Other Information My review of labs, imaging, notes and other tests is significant for Elevated creatinine although improved from before     Disposition Plan: Status is: Inpatient  Remains inpatient appropriate because: Ongoing hydration and treatment of UTI.  Possible discharge on Tuesday 11/1 home with home health PT depending on clinical condition    Updated son over phone    Time spent: 35 minutes [facility] 11/08/2020, 10:57 AM

## 2020-11-08 NOTE — Assessment & Plan Note (Signed)
Likely prerenal from dehydration from poor p.o. intake.  Improving with hydration.  Encouraged oral nutrition

## 2020-11-08 NOTE — Assessment & Plan Note (Signed)
At baseline.  Recommend palliative care eval as an outpatient

## 2020-11-08 NOTE — Hospital Course (Addendum)
87 y.o. female with medical history significant for CHF, hypertension, chronic venous stasis of both lower extremities and lymphedema, stage III chronic kidney disease and vitamin B12 deficiency anemia who was brought into the ER by EMS for evaluation of generalized weakness poor oral intake. She was recently hospitalized from 09/26 - 10/10 for COVID-pneumonia and severe deconditioning and required subacute rehab in a skilled nursing facility.  10/30: AKI improving.  On antibiotic for possible UTI 10/31: AKI resolved.

## 2020-11-08 NOTE — Assessment & Plan Note (Signed)
Improving with hydration.  Likely due to dehydration and mild UTI

## 2020-11-08 NOTE — Progress Notes (Signed)
PHARMACY - PHYSICIAN COMMUNICATION CRITICAL VALUE ALERT - BLOOD CULTURE IDENTIFICATION (BCID)  No results found for this or any previous visit.  BCID detected staph species. 1/4 (aerobic) bottles. gram stain growing GPCs in clusters.   Patient with pneumonia on ceftriaxone 1 gram every 24 hours. Patient is currently afebrile with WBC improving on ceftriaxone. Positive blood culture likely contaminant.   Name of physician (or Provider) Contacted: Dr. Sherryll Burger  Changes to prescribed antibiotics required: Increased ceftriaxone to 2 grams every 24 hours.  Jaynie Bream, PharmD Pharmacy Resident  11/08/2020 3:03 PM

## 2020-11-08 NOTE — Assessment & Plan Note (Signed)
Multifactorial.  Recommend home health PT, OT at discharge and palliative care to follow

## 2020-11-08 NOTE — Progress Notes (Signed)
Hypoglycemic Event  CBG: 66 with 0515 lab draw (serum glucose)  Treatment: 120 ml of orange juice  Symptoms: hungry  Follow-up CBG: Time:  CBG Result:  Possible Reasons for Event: pt verbalized that she had not had anything to eat all day and night  Comments/MD notified: pt wanted breakfast; not time for dining to serve breakfast; pt eating Frosted Flakes with 2% milk at present; to follow-up with blood sugar    Skylur Fuston Luiz Blare

## 2020-11-09 DIAGNOSIS — E440 Moderate protein-calorie malnutrition: Secondary | ICD-10-CM | POA: Insufficient documentation

## 2020-11-09 DIAGNOSIS — E872 Acidosis, unspecified: Secondary | ICD-10-CM

## 2020-11-09 LAB — BASIC METABOLIC PANEL
Anion gap: 7 (ref 5–15)
BUN: 49 mg/dL — ABNORMAL HIGH (ref 8–23)
CO2: 33 mmol/L — ABNORMAL HIGH (ref 22–32)
Calcium: 7.7 mg/dL — ABNORMAL LOW (ref 8.9–10.3)
Chloride: 99 mmol/L (ref 98–111)
Creatinine, Ser: 1.06 mg/dL — ABNORMAL HIGH (ref 0.44–1.00)
GFR, Estimated: 49 mL/min — ABNORMAL LOW (ref >60)
Glucose, Bld: 92 mg/dL (ref 70–99)
Potassium: 3.6 mmol/L (ref 3.5–5.1)
Sodium: 139 mmol/L (ref 135–145)

## 2020-11-09 LAB — CBC
HCT: 27.1 % — ABNORMAL LOW (ref 36.0–46.0)
Hemoglobin: 8.6 g/dL — ABNORMAL LOW (ref 12.0–15.0)
MCH: 30 pg (ref 26.0–34.0)
MCHC: 31.7 g/dL (ref 30.0–36.0)
MCV: 94.4 fL (ref 80.0–100.0)
Platelets: 303 10*3/uL (ref 150–400)
RBC: 2.87 MIL/uL — ABNORMAL LOW (ref 3.87–5.11)
RDW: 14.3 % (ref 11.5–15.5)
WBC: 7.3 10*3/uL (ref 4.0–10.5)
nRBC: 0.3 % — ABNORMAL HIGH (ref 0.0–0.2)

## 2020-11-09 LAB — URINE CULTURE: Culture: >=100000 — AB

## 2020-11-09 MED ORDER — ADULT MULTIVITAMIN W/MINERALS CH
1.0000 | ORAL_TABLET | Freq: Every day | ORAL | Status: DC
  Administered 2020-11-10 – 2020-11-11 (×2): 1 via ORAL
  Filled 2020-11-09 (×2): qty 1

## 2020-11-09 MED ORDER — ENOXAPARIN SODIUM 40 MG/0.4ML IJ SOSY
40.0000 mg | PREFILLED_SYRINGE | INTRAMUSCULAR | Status: DC
  Administered 2020-11-09 – 2020-11-10 (×2): 40 mg via SUBCUTANEOUS
  Filled 2020-11-09 (×2): qty 0.4

## 2020-11-09 MED ORDER — ENSURE ENLIVE PO LIQD
237.0000 mL | Freq: Three times a day (TID) | ORAL | Status: DC
  Administered 2020-11-09 – 2020-11-11 (×6): 237 mL via ORAL

## 2020-11-09 NOTE — Assessment & Plan Note (Signed)
See nursing documentation.  Agree with their assessment.  Dressing changes as ordered

## 2020-11-09 NOTE — Progress Notes (Signed)
  Progress Note    [name] [name]   [id number]  DOB: 1926-10-17  DOA: 11/07/2020     2 Date of Service: 11/09/2020   Clinical Course  87 y.o. female with medical history significant for CHF, hypertension, chronic venous stasis of both lower extremities and lymphedema, stage III chronic kidney disease and vitamin B12 deficiency anemia who was brought into the ER by EMS for evaluation of generalized weakness poor oral intake. She was recently hospitalized from 09/26 - 10/10 for COVID-pneumonia and severe deconditioning and required subacute rehab in a skilled nursing facility.  10/30: AKI improving.  On antibiotic for possible UTI 10/31: AKI resolved.    Assessment and Plan * AKI (acute kidney injury) (HCC) Likely prerenal from dehydration from poor p.o. intake.  Resolved with hydration.  Encouraged oral nutrition  Pressure injury of skin See nursing documentation.  Agree with their assessment.  Dressing changes as ordered  Lactic acidosis Improving with hydration.  Likely due to dehydration and mild UTI.  On ceftriaxone  BCID detected staph species. 1/4 (aerobic) bottles. gram stain growing GPCs in clusters.  Likely false positive and contaminant.  We will monitor for another 24 hours to make sure no other bottle turns positive  Dementia (HCC) At baseline.  Recommend palliative care eval as an outpatient.  Overall poor prognosis  General weakness Multifactorial.  Recommend home health PT, OT at discharge and palliative care to follow  BCID detected staph species. 1/4 (aerobic) bottles. gram stain growing GPCs in clusters.  Likely contaminant will monitor for another 24 hours to make sure it is not real     Subjective:  No new issues.  Eating better  Objective Vitals:   11/08/20 1535 11/08/20 2224 11/09/20 0002 11/09/20 0554  BP: (!) 145/84 (!) 113/56  113/64  Pulse: (!) 110 94 81 93  Resp: 17 16  16   Temp: 98.3 F (36.8 C) (!) 97.5 F (36.4 C)  98 F (36.7 C)   TempSrc:  Oral  Axillary  SpO2: (!) 83% (!) 52% 97% 96%  Weight:      Height:       72.4 kg  Vital signs were reviewed and unremarkable.   Exam Physical Exam   Constitutional:Alert and orientedonly to person.Not to place or time. Not in any apparent distress HEENT: Head:Normocephalic and atraumatic.  Eyes:PERLA, EOMI, Conjunctivae are normal. Sclera is non-icteric.  Mouth/Throat:Mucous membranes aredry.  Neck:Supple with no signs of meningismus. Cardiovascular:Regular rate and rhythm. No murmurs, gallops, or rubs. 2+ symmetrical distal pulses are present . No JVD. No LE edema Respiratory:Respiratory effort normal .Lungs sounds clear bilaterally. No wheezes, crackles, or rhonchi.  Gastrointestinal:Soft, non tender, and non distended with positive bowel sounds.  Genitourinary:No CVA tenderness. Musculoskeletal:Nontender with normal range of motion in all extremities. No cyanosis, or erythema of extremities. Neurologic: Face is symmetric. Moving all extremities. No gross focal neurologic deficits . Skin:Skin is warm, dry. No rash or ulcers, normal skin turgor Psychiatric: Mood and affect are normal  Labs / Other Information My review of labs, imaging, notes and other tests shows no new significant findings.    Disposition Plan: Status is: Inpatient  Remains inpatient appropriate because: Await 24 hours more to make sure other blood cultures do not turn positive currently 1 out of 4 is positive blood culture suggestive of contamination and false positive test    Updated son for possible discharge tomorrow    Time spent: 35 minutes [facility] 11/09/2020, 11:30 AM

## 2020-11-09 NOTE — Assessment & Plan Note (Signed)
Multifactorial.  Recommend home health PT, OT at discharge and palliative care to follow  BCID detected staph species. 1/4 (aerobic) bottles. gram stain growing GPCs in clusters.  Likely contaminant will monitor for another 24 hours to make sure it is not real

## 2020-11-09 NOTE — Assessment & Plan Note (Addendum)
Improving with hydration.  Likely due to dehydration and mild UTI.  On ceftriaxone  BCID detected staph species. 1/4 (aerobic) bottles. gram stain growing GPCs in clusters.  Likely false positive and contaminant.  We will monitor for another 24 hours to make sure no other bottle turns positive

## 2020-11-09 NOTE — Progress Notes (Addendum)
PHARMACIST - PHYSICIAN COMMUNICATION  CONCERNING:  Enoxaparin (Lovenox) for DVT Prophylaxis    RECOMMENDATION: Patient was prescribed enoxaprin 30 mg q24 hours for VTE prophylaxis.   Filed Weights   11/07/20 1937  Weight: 72.4 kg (159 lb 9.8 oz)    Body mass index is 25.76 kg/m.  Estimated Creatinine Clearance: 34.5 mL/min (A) (by C-G formula based on SCr of 1.06 mg/dL (H)).   Patient is candidate for enoxaparin 30mg  every 24 hours based on CrCl <35ml/min or Weight <45kg  DESCRIPTION: Pharmacy has adjusted enoxaparin dose per [facility] policy.  Patient is now receiving enoxaparin 40 mg every 24 hours    [facility], PharmD, BCPS Clinical Pharmacist  11/09/2020 10:57 AM

## 2020-11-09 NOTE — Evaluation (Addendum)
3Physical Therapy Evaluation Patient Details Name: [name] [name] MRN: [id number] DOB: 1926-06-12 Today's Date: 11/09/2020  History of Present Illness  Pt is a 87 y.o. F brought to ED for generalized weakness, poor oral intake. Pt recently hospitalized (9/26-10/10) following COVID-pneumonia leading to deconditioning and SNF placement. Pt was returned home following rehab for ~ 1 week. PMH includes CHF, hypertension, chronic venous stasis of both lower extremities and lymphedema, stage III chronic kidney disease and vitamin B12 deficiency anemia  Clinical Impression  Pt supine in bed, initially conversive and polite but oriented to name only. Pt incontinent and wet in bed. Pt unable to provide information on home environment or PLOF. From previous chart review, son reported pt transferring to [facility] with intermittent assistance levels and has not walked in the past 2 years.  During this evaluation, when asked to mobilize, pt becomes verbally aggressive and unwilling to move requiring TOTAL A for clean-up, 3+ assist for physical assist and safety due to patient's agitation. Pt unable/unwilling to participate with therapy services. Per chart review, pt has had SNF placement multiple times and may be at new baseline level of function and may benefit from transition to higher levels of care. PT to sign off at this time.     Recommendations for follow up therapy are one component of a multi-disciplinary discharge planning process, led by the attending physician.  Recommendations may be updated based on patient status, additional functional criteria and insurance authorization.  Follow Up Recommendations No PT follow up    Assistance Recommended at Discharge Frequent or constant Supervision/Assistance  Functional Status Assessment Patient has not had a recent decline in their functional status  Equipment Recommendations  None recommended by PT    Recommendations for Other Services       Precautions /  Restrictions Precautions Precautions: Fall Restrictions Weight Bearing Restrictions: No      Mobility  Bed Mobility Overal bed mobility: Needs Assistance Bed Mobility: Rolling Rolling: Max assist         General bed mobility comments: Pt was unwilling to roll for bed change due to incontience requiring MAX-A; +3 for linen change    Transfers                   General transfer comment: Not attempted, pt unwilling    Ambulation/Gait                Stairs            Wheelchair Mobility    Modified Rankin (Stroke Patients Only)       Balance Overall balance assessment:  (Not assessed)                                           Pertinent Vitals/Pain Pain Assessment: Faces Faces Pain Scale: No hurt Pain Intervention(s): Monitored during session    Home Living Family/patient expects to be discharged to:: Private residence Living Arrangements: Alone Available Help at Discharge: Family Type of Home: House         Home Layout: One level Home Equipment: Conservation officer, nature (2 wheels);Wheelchair Probation officer (4 wheels) Additional Comments: All home information retrieved from chart, pt disoriented and unable to provide meaninful history    Prior Function Prior Level of Function : Needs assist  Cognitive Assist : Mobility (cognitive);ADLs (cognitive)     Physical Assist : Mobility (physical);ADLs (physical)  Mobility Comments: Per chart review from previous PT encounters, pt required significant assist +2 MOD-MAX A for mobility while in hospital. Per previous chart, son reports pt transfers to [facility] with intermittent levels of assist and has not walked > 2 years ago. ADLs Comments: Previous chart notes indicate assistance required for all aspects of ADLs, grooming, bathing, dressing, toileting     Hand Dominance        Extremity/Trunk Assessment   Upper Extremity Assessment Upper Extremity Assessment: Generalized  weakness    Lower Extremity Assessment Lower Extremity Assessment: Generalized weakness       Communication   Communication: No difficulties  Cognition Arousal/Alertness: Awake/alert Behavior During Therapy: Agitated Overall Cognitive Status: History of cognitive impairments - at baseline                                 General Comments: Pt largely agitated and aggressive towards staff but does not attempt to use physical force, only verbally. Pt unwilling to follow commands. Oriented to name only        General Comments      Exercises Other Exercises Other Exercises: PT and nursing performed bed change and clean-up with MAX A for rolling   Assessment/Plan    PT Assessment Patient does not need any further PT services  PT Problem List Decreased strength;Decreased activity tolerance;Decreased mobility;Decreased safety awareness;Decreased cognition       PT Treatment Interventions      PT Goals (Current goals can be found in the Care Plan section)       Frequency     Barriers to discharge        Co-evaluation               AM-PAC PT "6 Clicks" Mobility  Outcome Measure Help needed turning from your back to your side while in a flat bed without using bedrails?: A Lot Help needed moving from lying on your back to sitting on the side of a flat bed without using bedrails?: Total Help needed moving to and from a bed to a chair (including a wheelchair)?: Total Help needed standing up from a chair using your arms (e.g., wheelchair or bedside chair)?: Total Help needed to walk in hospital room?: Total Help needed climbing 3-5 steps with a railing? : Total 6 Click Score: 7    End of Session   Activity Tolerance: Treatment limited secondary to agitation Patient left: in bed;with call bell/phone within reach;with nursing/sitter in room Nurse Communication: Mobility status PT Visit Diagnosis: Muscle weakness (generalized) (M62.81)    Time:  0630-1601 PT Time Calculation (min) (ACUTE ONLY): 19 min   Charges:   PT Evaluation $PT Eval Moderate Complexity: 1 Mod PT Treatments $Therapeutic Activity: 8-22 mins       [employer], SPT

## 2020-11-09 NOTE — Progress Notes (Signed)
Initial Nutrition Assessment  DOCUMENTATION CODES:   Non-severe (moderate) malnutrition in context of chronic illness  INTERVENTION:   -Ensure Enlive po TID, each supplement provides 350 kcal and 20 grams of protein  -MVI with minerals daily -Liberalize diet to regular  NUTRITION DIAGNOSIS:   Moderate Malnutrition related to chronic illness (dementia) as evidenced by mild fat depletion, moderate fat depletion, mild muscle depletion, moderate muscle depletion, percent weight loss.  GOAL:   Patient will meet greater than or equal to 90% of their needs  MONITOR:   PO intake, Supplement acceptance, Labs, Weight trends, Skin, I & O's  REASON FOR ASSESSMENT:   Low Braden    ASSESSMENT:   Patient is a 87 year old female who presents to the ER for evaluation of generalized body aches and poor oral intake.  Pt admitted with AKI.   Spoke with pt at bedside, who was pleasantly confused at time of visit. She complains of being drowsy and drifted off to sleep multiple times during this RD's assessment.   Noted lunch tray untouched. Meal completions documented at 20%.   Reviewed wt hx; pt has experienced a 12.9% wt loss over the past month, which is significant for time frame.   Medications reviewed and include miralax, vitamin B-12, and sodium bicarbonate infusion @ 125 ml/hr.   Labs reviewed.   NUTRITION - FOCUSED PHYSICAL EXAM:  Flowsheet Row Most Recent Value  Orbital Region Moderate depletion  Upper Arm Region Moderate depletion  Thoracic and Lumbar Region Moderate depletion  Buccal Region Moderate depletion  Temple Region Mild depletion  Clavicle Bone Region Severe depletion  Clavicle and Acromion Bone Region Severe depletion  Scapular Bone Region Severe depletion  Dorsal Hand Moderate depletion  Patellar Region Moderate depletion  Anterior Thigh Region Moderate depletion  Posterior Calf Region Moderate depletion  Edema (RD Assessment) None  Hair Reviewed  Eyes  Reviewed  Mouth Reviewed  Skin Reviewed  Nails Reviewed       Diet Order:   Diet Order             Diet Heart Room service appropriate? Yes; Fluid consistency: Thin  Diet effective now                   EDUCATION NEEDS:   Not appropriate for education at this time  Skin:  Skin Assessment: Skin Integrity Issues: Skin Integrity Issues:: Stage I Stage I: rt sacrum  Last BM:  11/08/20  Height:   Ht Readings from Last 1 Encounters:  11/07/20 5\' 6"  (1.676 m)    Weight:   Wt Readings from Last 1 Encounters:  11/07/20 72.4 kg    Ideal Body Weight:  59.1 kg  BMI:  Body mass index is 25.76 kg/m.  Estimated Nutritional Needs:   Kcal:  1800-2000  Protein:  90-105 grams  Fluid:  > 1.8 L    11/09/20, RD, LDN, CDCES Registered Dietitian II Certified Diabetes Care and Education Specialist Please refer to [facility] for RD and/or RD on-call/weekend/after hours pager

## 2020-11-09 NOTE — Assessment & Plan Note (Signed)
Likely prerenal from dehydration from poor p.o. intake.  Resolved with hydration.  Encouraged oral nutrition

## 2020-11-09 NOTE — Assessment & Plan Note (Signed)
At baseline.  Recommend palliative care eval as an outpatient.  Overall poor prognosis

## 2020-11-10 DIAGNOSIS — D72829 Elevated white blood cell count, unspecified: Secondary | ICD-10-CM

## 2020-11-10 DIAGNOSIS — N390 Urinary tract infection, site not specified: Secondary | ICD-10-CM

## 2020-11-10 LAB — CBC
HCT: 27.4 % — ABNORMAL LOW (ref 36.0–46.0)
Hemoglobin: 8.8 g/dL — ABNORMAL LOW (ref 12.0–15.0)
MCH: 31.1 pg (ref 26.0–34.0)
MCHC: 32.1 g/dL (ref 30.0–36.0)
MCV: 96.8 fL (ref 80.0–100.0)
Platelets: 300 10*3/uL (ref 150–400)
RBC: 2.83 MIL/uL — ABNORMAL LOW (ref 3.87–5.11)
RDW: 14.3 % (ref 11.5–15.5)
WBC: 6.9 10*3/uL (ref 4.0–10.5)
nRBC: 0.3 % — ABNORMAL HIGH (ref 0.0–0.2)

## 2020-11-10 LAB — BASIC METABOLIC PANEL
Anion gap: 5 (ref 5–15)
BUN: 31 mg/dL — ABNORMAL HIGH (ref 8–23)
CO2: 37 mmol/L — ABNORMAL HIGH (ref 22–32)
Calcium: 7.9 mg/dL — ABNORMAL LOW (ref 8.9–10.3)
Chloride: 96 mmol/L — ABNORMAL LOW (ref 98–111)
Creatinine, Ser: 0.9 mg/dL (ref 0.44–1.00)
GFR, Estimated: 60 mL/min — ABNORMAL LOW (ref >60)
Glucose, Bld: 101 mg/dL — ABNORMAL HIGH (ref 70–99)
Potassium: 4.5 mmol/L (ref 3.5–5.1)
Sodium: 138 mmol/L (ref 135–145)

## 2020-11-10 MED ORDER — ENSURE ENLIVE PO LIQD: 237.0000 mL | Freq: Three times a day (TID) | ORAL | 12 refills | Status: DC

## 2020-11-10 NOTE — TOC Initial Note (Addendum)
Transition of Care [facility]) - Initial/Assessment Note    Patient Details  Name: [name] [name] MRN: [id number] Date of Birth: [birth year]  [facility] CM/SW Contact:    Caryn Section, RN Phone Number: 11/10/2020, 11:06 AM  Clinical Narrative:    Patient lives at home with son.  Son states that he and home health take care of her.  He has no concerns about patient returning home at discharge.    EMS called to transport patient to resident, 3rd on list and ems aware that son goes to work at [employer], he states that home health will be at residence, so patient will not be alone.  he prefers patient to return prior to him leaving for work.         Addendum:  confirmed home address with son: [address]  [place] [address] Expected Discharge Plan: Home w Home Health Services Barriers to Discharge: Barriers Resolved   Patient Goals and CMS Choice     Choice offered to / list presented to : NA  Expected Discharge Plan and Services Expected Discharge Plan: Home w Home Health Services     Post Acute Care Choice: Home Health Living arrangements for the past 2 months: Single Family Home Expected Discharge Date: 11/10/20                         [facility] Arranged:  (Address: [address] [place], [place], [place] [address]    Phone: [phone])       Representative spoke with at [facility] Agency: [facility] is previous Home Health agency.  Son states they have been serving him and patient prior to admit.  Prior Living Arrangements/Services Living arrangements for the past 2 months: Single Family Home Lives with:: Relatives, Self Patient language and need for interpreter reviewed:: Yes (Spoke to son) Do you feel safe going back to the place where you live?: Yes      Need for Family Participation in Patient Care: Yes (Comment) Care giver support system in place?: Yes (comment) Current home services:  (Patient son states he has all that he needs at home at this time.) Criminal  Activity/Legal Involvement Pertinent to Current Situation/Hospitalization: No - Comment as needed  Activities of Daily Living Home Assistive Devices/Equipment: Wheelchair ADL Screening (condition at time of admission) Patient's cognitive ability adequate to safely complete daily activities?: No Is the patient deaf or have difficulty hearing?: No Does the patient have difficulty seeing, even when wearing glasses/contacts?: No Does the patient have difficulty concentrating, remembering, or making decisions?: Yes Patient able to express need for assistance with ADLs?: Yes Does the patient have difficulty dressing or bathing?: Yes Independently performs ADLs?: No Communication: Independent Dressing (OT): Needs assistance Is this a change from baseline?: Pre-admission baseline Grooming: Needs assistance Is this a change from baseline?: Pre-admission baseline Feeding: Needs assistance Is this a change from baseline?: Pre-admission baseline Bathing: Needs assistance Is this a change from baseline?: Pre-admission baseline Toileting: Needs assistance Is this a change from baseline?: Pre-admission baseline In/Out Bed: Needs assistance Is this a change from baseline?: Pre-admission baseline Walks in Home: Needs assistance Is this a change from baseline?: Pre-admission baseline Does the patient have difficulty walking or climbing stairs?: Yes Weakness of Legs: Both Weakness of Arms/Hands: None  Permission Sought/Granted Permission sought to share information with : Case Manager Permission granted to share information with : Yes, Verbal Permission Granted     Permission granted to share info w  AGENCY: home health agency        Emotional Assessment Appearance:: Appears stated age Attitude/Demeanor/Rapport:  (spoke with son) Affect (typically observed):  (spoke with son) Orientation: : Oriented to Self Alcohol / Substance Use: Not Applicable Psych Involvement: No (comment)  Admission  diagnosis:  Acute UTI [N39.0] AKI (acute kidney injury) ([place]) [N17.9] Leukocytosis, unspecified type [D72.829] Patient Active Problem List   Diagnosis Date Noted   Leukocytosis    Malnutrition of moderate degree 11/09/2020   Pressure injury of skin 11/08/2020   Dementia ([place]) 11/07/2020   Lactic acidosis 11/07/2020   Superficial bruising of arm, left, initial encounter    Acute CHF (congestive heart failure) ([place]) 04/06/2020   HTN (hypertension), malignant    Lymphedema    Fall    Stage 3b chronic kidney disease ([place])    B12 deficiency    Anemia due to vitamin B12 deficiency    Acute metabolic encephalopathy    General weakness    Confusion    Acute UTI 01/07/2020   E. coli UTI 01/07/2020   Chronic venous stasis dermatitis of both lower extremities 01/07/2020   Cellulitis 03/06/2017   Left leg cellulitis 11/25/2016   AKI (acute kidney injury) ([place]) 11/05/2016   Decubitus ulcer of ischium, stage 2, left ([place]) 11/05/2016   Troponin I above reference range 11/04/2016   PCP:  Leonel Ramsay, MD Pharmacy:   [facility] [address]), [place] [address] [place]) [place] [address] Phone: [phone] Fax: [phone]     Social Determinants of Health (SDOH) Interventions    Readmission Risk Interventions No flowsheet data found.

## 2020-11-10 NOTE — Care Management Important Message (Signed)
Important Message  Patient Details  Name: [name] [name] MRN: [id number] Date of Birth: July 02, 1926   Medicare Important Message Given:  Yes     [name] 11/10/2020, 11:31 AM

## 2020-11-11 LAB — CULTURE, BLOOD (ROUTINE X 2): Special Requests: ADEQUATE

## 2020-11-11 MED ORDER — CEPHALEXIN 500 MG PO CAPS: 500.0000 mg | ORAL_CAPSULE | Freq: Three times a day (TID) | ORAL | 0 refills | Status: AC

## 2020-11-11 NOTE — Progress Notes (Signed)
[name] [name] [name] made aware that EMS here at this time to transport pt home, [name] [name] waiting on pt

## 2020-11-11 NOTE — Assessment & Plan Note (Addendum)
Improving with hydration.  Likely due to dehydration and mild UTI.  On ceftriaxone  BCID detected staph species. 1/4 (aerobic) bottles. gram stain growing GPCs in clusters.  Likely false positive and contaminant. Plan d/c today but EMS is backed up so may not happen

## 2020-11-11 NOTE — Final Progress Note (Signed)
Unfortunately EMS didn't come till midnight from morning discharge yesterday leading to patient needing to stay per son's request.

## 2020-11-11 NOTE — Assessment & Plan Note (Signed)
Likely prerenal from dehydration from poor p.o. intake.  Resolved with hydration.  Encouraged oral nutrition

## 2020-11-11 NOTE — Progress Notes (Signed)
[facility] Rm 116 Civil engineer, contracting [facility]) Hospital Liaison note:  This is a pending outpatient-based Palliative Care patient. Will continue to follow for disposition.  Please call with any outpatient palliative questions or concerns.  Thank you, Abran Cantor, LPN [facility] Liaison [phone]

## 2020-11-11 NOTE — Assessment & Plan Note (Signed)
At baseline.  Recommend palliative care eval as an outpatient.  Overall poor prognosis

## 2020-11-11 NOTE — Discharge Summary (Signed)
Physician Discharge Summary  Patient ID: [name] [name] MRN: [id number] DOB/AGE: [birth year] 85 y.o.  Admit date: 11/07/2020 Discharge date: 11/11/2020  Admission Diagnoses:  Discharge Diagnoses:  Principal Problem:   AKI (acute kidney injury) (HCC) Active Problems:   Acute UTI   General weakness   B12 deficiency   Dementia (HCC)   Lactic acidosis   Pressure injury of skin   Malnutrition of moderate degree   Leukocytosis   Discharged Condition: good  Hospital Course:  87 y.o. female with medical history significant for CHF, hypertension, chronic venous stasis of both lower extremities and lymphedema, stage III chronic kidney disease and vitamin B12 deficiency anemia who was brought into the ER by EMS for evaluation of generalized weakness poor oral intake. She was recently hospitalized from 09/26 - 10/10 for COVID-pneumonia and severe deconditioning and required subacute rehab in a skilled nursing facility.   10/30: AKI improving.  On antibiotic for possible UTI 10/31: AKI resolved.   Patient condition had improved, currently renal function has normalized.  She is still on IV antibiotics for UTI, will transition to oral for additional 2 days.  Pressure ulcer POA.   Pressure Injury 11/07/20 Sacrum Right;Left Stage 1 -  Intact skin with non-blanchable redness of a localized area usually over a bony prominence. (Active)  11/07/20 1711  Location: Sacrum  Location Orientation: Right;Left  Staging: Stage 1 -  Intact skin with non-blanchable redness of a localized area usually over a bony prominence.  Wound Description (Comments):   Present on Admission: Yes        Consults: None  Significant Diagnostic Studies:   Treatments: antibiotics  Discharge Exam: Blood pressure (!) 131/56, pulse 61, temperature 98.6 F (37 C), resp. rate 16, height 5\' 6"  (1.676 m), weight 72.4 kg, SpO2 96 %. General appearance: alert, cooperative, and oriented to self Resp: clear to  auscultation bilaterally Cardio: regular rate and rhythm, S1, S2 normal, no murmur, click, rub or gallop GI: soft, non-tender; bowel sounds normal; no masses,  no organomegaly Extremities: extremities normal, atraumatic, no cyanosis or edema  Disposition: Discharge disposition: 01-Home or Self Care       Discharge Instructions     Amb Referral to Palliative Care   Complete by: As directed    Diet - low sodium heart healthy   Complete by: As directed    Diet - low sodium heart healthy   Complete by: As directed    Discharge wound care:   Complete by: As directed    Follow with PCP   Increase activity slowly   Complete by: As directed    Increase activity slowly   Complete by: As directed    No wound care   Complete by: As directed       Allergies as of 11/11/2020   No Known Allergies      Medication List     STOP taking these medications    cyanocobalamin 1000 MCG tablet   traZODone 50 MG tablet Commonly known as: DESYREL       TAKE these medications    acetaminophen 500 MG tablet Commonly known as: TYLENOL Take 1 tablet (500 mg total) by mouth every 6 (six) hours as needed for mild pain or headache (fever >/= 101).   cephALEXin 500 MG capsule Commonly known as: KEFLEX Take 1 capsule (500 mg total) by mouth 3 (three) times daily for 2 days.   feeding supplement Liqd Take 237 mLs by mouth 3 (three) times daily between meals.  polyethylene glycol 17 g packet Commonly known as: MIRALAX / GLYCOLAX Take 17 g by mouth 2 (two) times daily.   senna-docusate 8.6-50 MG tablet Commonly known as: Senokot-S Take 1 tablet by mouth 2 (two) times daily.               Discharge Care Instructions  (From admission, onward)           Start     Ordered   11/11/20 0000  Discharge wound care:       Comments: Follow with PCP   11/11/20 0917            Follow-up Information     Leonel Ramsay, MD. Go on 11/17/2020.   Specialty: Infectious  Diseases Why: @11 :45am Contact information: [place] [address] [phone]         Leonel Ramsay, MD Follow up in 1 week(s).   Specialty: Infectious Diseases Contact information: [place] [address] [phone]                 Signed: Sharen Hones 11/11/2020, 9:17 AM

## 2020-11-11 NOTE — Assessment & Plan Note (Signed)
Multifactorial.  Recommend home health PT, OT at discharge and palliative care to follow  BCID detected staph species. 1/4 (aerobic) bottles. gram stain growing GPCs in clusters.  Likely contaminant will monitor for another 24 hours to make sure it is not real

## 2020-11-11 NOTE — Assessment & Plan Note (Signed)
See nursing documentation.  Agree with their assessment.  Dressing changes as ordered

## 2020-11-11 NOTE — TOC Transition Note (Signed)
Transition of Care [facility]) - CM/SW Discharge Note   Patient Details  Name: [name] [name] MRN: [id number] Date of Birth: 01-Mar-1926  Transition of Care [facility]) CM/SW Contact:  Allayne Butcher, RN Phone Number: 11/11/2020, 9:02 AM   Clinical Narrative:    EMS has been arranged.  Patient is 3rd on the list for transport.  Son [name] aware that EMS is set up.   Final next level of care: Home w Home Health Services Barriers to Discharge: Barriers Resolved   Patient Goals and CMS Choice     Choice offered to / list presented to : NA  Discharge Placement                Patient to be transferred to facility by: [facility] to pick patient up to transport home Name of family member notified: Sonam Wandel (son) Patient and family notified of of transfer: 11/11/20  Discharge Plan and Services     Post Acute Care Choice: Home Health                    [facility] Arranged:  (Address: [address], [place], [place] [address]    Phone: [phone])       Representative spoke with at [facility] Agency: [facility] is previous Home Health agency.  Son states they have been serving him and patient prior to admit.  Social Determinants of Health (SDOH) Interventions     Readmission Risk Interventions No flowsheet data found.

## 2020-11-11 NOTE — Progress Notes (Signed)
[name] [name] to be D/C'd  per MD order.  Discussed with the patient and all questions fully answered.  VSS, Skin clean, dry and intact without evidence of skin break down, no evidence of skin tears noted.  IV catheter discontinued intact. Site without signs and symptoms of complications. Dressing and pressure applied.  An After Visit Summary was printed and placed in packet to be given to family. Patient received prescription.  D/c education completed with patient/family including follow up instructions, medication list, d/c activities limitations if indicated, with other d/c instructions as indicated by MD - patient able to verbalize understanding, all questions fully answered.   Patient instructed to return to ED, call 911, or call MD for any changes in condition.   Patient to be escorted via stretcher, and D/C home via EMS transport .

## 2020-11-11 NOTE — Progress Notes (Signed)
EMS arrived at midnight to pick up patient. Nurse called son [name] to let him know that EMS was here. [name] refused discharged due to it being so late at night. On call MD notified and aware that patient will not be discharged tonight. [name] will like to be notified of when EMS comes to get her in the morning. [name] is also aware that patient may not be discharged until late morning.

## 2020-11-11 NOTE — Progress Notes (Addendum)
  Progress Note    [name] [name]   YIF:[id number]  DOB: 12-30-1926  DOA: 11/07/2020     4 Date of Service: 11/10/2020   Clinical Course  Doing well. Ready to go back home.  Assessment and Plan * AKI (acute kidney injury) (HCC) Likely prerenal from dehydration from poor p.o. intake.  Resolved with hydration.  Encouraged oral nutrition  Pressure injury of skin See nursing documentation.  Agree with their assessment.  Dressing changes as ordered  Lactic acidosis Improving with hydration.  Likely due to dehydration and mild UTI.  On ceftriaxone  BCID detected staph species. 1/4 (aerobic) bottles. gram stain growing GPCs in clusters.  Likely false positive and contaminant. Plan d/c today but EMS is backed up so may not happen  Dementia (HCC) At baseline.  Recommend palliative care eval as an outpatient.  Overall poor prognosis  General weakness Multifactorial.  Recommend home health PT, OT at discharge and palliative care to follow  BCID detected staph species. 1/4 (aerobic) bottles. gram stain growing GPCs in clusters.  Likely contaminant will monitor for another 24 hours to make sure it is not real     Subjective:  No new c/o - feeling back to normal  Objective Vitals:   11/10/20 2357 11/11/20 0451 11/11/20 0748 11/11/20 1234  BP: (!) 120/55 (!) 120/59 (!) 131/56 (!) 107/53  Pulse: 69 63 61 72  Resp: 16 16 16 18   Temp: 97.6 F (36.4 C) 98.2 F (36.8 C) 98.6 F (37 C) 98 F (36.7 C)  TempSrc:      SpO2: 96%     Weight:      Height:       72.4 kg  Vital signs were reviewed and unremarkable.   Exam Physical Exam   General appearance: alert, cooperative, and oriented to self Resp: clear to auscultation bilaterally Cardio: regular rate and rhythm, S1, S2 normal, no murmur, click, rub or gallop GI: soft, non-tender; bowel sounds normal; no masses,  no organomegaly Extremities: extremities normal, atraumatic, no cyanosis or edema  Labs / Other  Information My review of labs, imaging, notes and other tests shows no new significant findings.      Time spent: 35 minutes [facility] 11/10/2020, 2:05 PM

## 2020-11-12 LAB — CULTURE, BLOOD (ROUTINE X 2)
Culture: NO GROWTH
Special Requests: ADEQUATE

## 2020-11-13 ENCOUNTER — Telehealth: Payer: Self-pay | Admitting: Nurse Practitioner

## 2020-11-13 NOTE — Telephone Encounter (Signed)
Spoke with patient's son [name], regarding the Palliative referral/services and after explaining Palliative services to him he has declined services at this time.  Son stated that he didn't think patient needed this service.  Told son that I would cancel the referral and notify Dr. Sampson Goon and he was in agreement with this.

## 2020-11-20 ENCOUNTER — Telehealth: Payer: Self-pay | Admitting: Nurse Practitioner

## 2020-11-20 NOTE — Telephone Encounter (Signed)
Attempted to contact patient's son [name] to offer to schedule the Palliative Consult, no answer and unable to leave a message due to mailbox was full.  I then called patient's home number and the line was busy.

## 2020-11-23 ENCOUNTER — Telehealth: Payer: Self-pay | Admitting: Nurse Practitioner

## 2020-11-23 NOTE — Telephone Encounter (Signed)
Spoke with patient's son [name] regarding the Palliative referral/services and he was in agreement with scheduling visit.  I have scheduled an In-home Consult for 12/15/20 @ 12:30 PM

## 2020-12-12 ENCOUNTER — Emergency Department: Payer: Medicare HMO

## 2020-12-12 ENCOUNTER — Inpatient Hospital Stay
Admission: EM | Admit: 2020-12-12 | Discharge: 2020-12-16 | DRG: 271 | Disposition: A | Discharge status: Home Health | Payer: Medicare HMO | Attending: Student in an Organized Health Care Education/Training Program | Admitting: Student in an Organized Health Care Education/Training Program

## 2020-12-12 ENCOUNTER — Other Ambulatory Visit: Payer: Self-pay

## 2020-12-12 DIAGNOSIS — I878 Other specified disorders of veins: Secondary | ICD-10-CM | POA: Diagnosis present

## 2020-12-12 DIAGNOSIS — Z8249 Family history of ischemic heart disease and other diseases of the circulatory system: Secondary | ICD-10-CM

## 2020-12-12 DIAGNOSIS — I10 Essential (primary) hypertension: Secondary | ICD-10-CM | POA: Diagnosis not present

## 2020-12-12 DIAGNOSIS — I130 Hypertensive heart and chronic kidney disease with heart failure and stage 1 through stage 4 chronic kidney disease, or unspecified chronic kidney disease: Secondary | ICD-10-CM | POA: Diagnosis present

## 2020-12-12 DIAGNOSIS — N1832 Chronic kidney disease, stage 3b: Secondary | ICD-10-CM | POA: Diagnosis present

## 2020-12-12 DIAGNOSIS — I872 Venous insufficiency (chronic) (peripheral): Secondary | ICD-10-CM | POA: Diagnosis present

## 2020-12-12 DIAGNOSIS — D631 Anemia in chronic kidney disease: Secondary | ICD-10-CM | POA: Diagnosis present

## 2020-12-12 DIAGNOSIS — F0390 Unspecified dementia without behavioral disturbance: Secondary | ICD-10-CM | POA: Diagnosis present

## 2020-12-12 DIAGNOSIS — E538 Deficiency of other specified B group vitamins: Secondary | ICD-10-CM | POA: Diagnosis present

## 2020-12-12 DIAGNOSIS — Z66 Do not resuscitate: Secondary | ICD-10-CM | POA: Diagnosis present

## 2020-12-12 DIAGNOSIS — I824Y2 Acute embolism and thrombosis of unspecified deep veins of left proximal lower extremity: Secondary | ICD-10-CM | POA: Diagnosis not present

## 2020-12-12 DIAGNOSIS — Z833 Family history of diabetes mellitus: Secondary | ICD-10-CM

## 2020-12-12 DIAGNOSIS — I82432 Acute embolism and thrombosis of left popliteal vein: Secondary | ICD-10-CM | POA: Diagnosis present

## 2020-12-12 DIAGNOSIS — I82409 Acute embolism and thrombosis of unspecified deep veins of unspecified lower extremity: Secondary | ICD-10-CM | POA: Diagnosis not present

## 2020-12-12 DIAGNOSIS — S90822A Blister (nonthermal), left foot, initial encounter: Secondary | ICD-10-CM | POA: Diagnosis present

## 2020-12-12 DIAGNOSIS — Z20822 Contact with and (suspected) exposure to covid-19: Secondary | ICD-10-CM | POA: Diagnosis present

## 2020-12-12 DIAGNOSIS — L03116 Cellulitis of left lower limb: Secondary | ICD-10-CM | POA: Diagnosis present

## 2020-12-12 DIAGNOSIS — Z90710 Acquired absence of both cervix and uterus: Secondary | ICD-10-CM | POA: Diagnosis not present

## 2020-12-12 DIAGNOSIS — I5032 Chronic diastolic (congestive) heart failure: Secondary | ICD-10-CM | POA: Diagnosis present

## 2020-12-12 DIAGNOSIS — I82402 Acute embolism and thrombosis of unspecified deep veins of left lower extremity: Secondary | ICD-10-CM | POA: Diagnosis not present

## 2020-12-12 DIAGNOSIS — D519 Vitamin B12 deficiency anemia, unspecified: Secondary | ICD-10-CM | POA: Diagnosis present

## 2020-12-12 DIAGNOSIS — M7989 Other specified soft tissue disorders: Secondary | ICD-10-CM | POA: Diagnosis present

## 2020-12-12 DIAGNOSIS — Z7401 Bed confinement status: Secondary | ICD-10-CM | POA: Diagnosis not present

## 2020-12-12 DIAGNOSIS — L089 Local infection of the skin and subcutaneous tissue, unspecified: Secondary | ICD-10-CM | POA: Diagnosis not present

## 2020-12-12 DIAGNOSIS — L0390 Cellulitis, unspecified: Secondary | ICD-10-CM

## 2020-12-12 DIAGNOSIS — I82412 Acute embolism and thrombosis of left femoral vein: Secondary | ICD-10-CM | POA: Diagnosis present

## 2020-12-12 DIAGNOSIS — Z79899 Other long term (current) drug therapy: Secondary | ICD-10-CM | POA: Diagnosis not present

## 2020-12-12 LAB — BASIC METABOLIC PANEL
Anion gap: 8 (ref 5–15)
BUN: 25 mg/dL — ABNORMAL HIGH (ref 8–23)
CO2: 22 mmol/L (ref 22–32)
Calcium: 8.3 mg/dL — ABNORMAL LOW (ref 8.9–10.3)
Chloride: 109 mmol/L (ref 98–111)
Creatinine, Ser: 0.91 mg/dL (ref 0.44–1.00)
GFR, Estimated: 59 mL/min — ABNORMAL LOW (ref >60)
Glucose, Bld: 76 mg/dL (ref 70–99)
Potassium: 4.4 mmol/L (ref 3.5–5.1)
Sodium: 139 mmol/L (ref 135–145)

## 2020-12-12 LAB — CBC WITH DIFFERENTIAL/PLATELET
Abs Immature Granulocytes: 0.01 10*3/uL (ref 0.00–0.07)
Basophils Absolute: 0.1 10*3/uL (ref 0.0–0.1)
Basophils Relative: 1 %
Eosinophils Absolute: 0.3 10*3/uL (ref 0.0–0.5)
Eosinophils Relative: 6 %
HCT: 28.8 % — ABNORMAL LOW (ref 36.0–46.0)
Hemoglobin: 8.5 g/dL — ABNORMAL LOW (ref 12.0–15.0)
Immature Granulocytes: 0 %
Lymphocytes Relative: 32 %
Lymphs Abs: 1.4 10*3/uL (ref 0.7–4.0)
MCH: 29.1 pg (ref 26.0–34.0)
MCHC: 29.5 g/dL — ABNORMAL LOW (ref 30.0–36.0)
MCV: 98.6 fL (ref 80.0–100.0)
Monocytes Absolute: 0.4 10*3/uL (ref 0.1–1.0)
Monocytes Relative: 9 %
Neutro Abs: 2.3 10*3/uL (ref 1.7–7.7)
Neutrophils Relative %: 52 %
Platelets: 330 10*3/uL (ref 150–400)
RBC: 2.92 MIL/uL — ABNORMAL LOW (ref 3.87–5.11)
RDW: 15.1 % (ref 11.5–15.5)
WBC: 4.5 10*3/uL (ref 4.0–10.5)
nRBC: 0 % (ref 0.0–0.2)

## 2020-12-12 LAB — PROTIME-INR
INR: 1.2 (ref 0.8–1.2)
Prothrombin Time: 14.9 seconds (ref 11.4–15.2)

## 2020-12-12 LAB — C-REACTIVE PROTEIN: CRP: 2.3 mg/dL — ABNORMAL HIGH (ref <1.0)

## 2020-12-12 LAB — BRAIN NATRIURETIC PEPTIDE: B Natriuretic Peptide: 356.2 pg/mL — ABNORMAL HIGH (ref 0.0–100.0)

## 2020-12-12 LAB — RESP PANEL BY RT-PCR (FLU A&B, COVID) ARPGX2
Influenza A by PCR: NEGATIVE
Influenza B by PCR: NEGATIVE
SARS Coronavirus 2 by RT PCR: NEGATIVE

## 2020-12-12 LAB — SEDIMENTATION RATE: Sed Rate: 54 mm/hr — ABNORMAL HIGH (ref 0–30)

## 2020-12-12 LAB — APTT: aPTT: 39 seconds — ABNORMAL HIGH (ref 24–36)

## 2020-12-12 MED ORDER — HEPARIN (PORCINE) 25000 UT/250ML-% IV SOLN
1100.0000 [IU]/h | INTRAVENOUS | Status: DC
  Administered 2020-12-12 – 2020-12-14 (×4): 1100 [IU]/h via INTRAVENOUS
  Filled 2020-12-12 (×3): qty 250

## 2020-12-12 MED ORDER — DOXYCYCLINE HYCLATE 100 MG PO TABS: 100.0000 mg | ORAL_TABLET | Freq: Once | ORAL | Status: DC

## 2020-12-12 MED ORDER — ONDANSETRON HCL 4 MG/2ML IJ SOLN: 4.0000 mg | Freq: Three times a day (TID) | INTRAMUSCULAR | Status: DC | PRN

## 2020-12-12 MED ORDER — SENNOSIDES-DOCUSATE SODIUM 8.6-50 MG PO TABS: 1.0000 | ORAL_TABLET | Freq: Two times a day (BID) | ORAL | Status: DC | PRN

## 2020-12-12 MED ORDER — OXYCODONE-ACETAMINOPHEN 5-325 MG PO TABS
1.0000 | ORAL_TABLET | ORAL | Status: DC | PRN
  Administered 2020-12-15 (×2): 1 via ORAL
  Filled 2020-12-12 (×3): qty 1

## 2020-12-12 MED ORDER — HYDRALAZINE HCL 20 MG/ML IJ SOLN: 5.0000 mg | INTRAMUSCULAR | Status: DC | PRN

## 2020-12-12 MED ORDER — HEPARIN BOLUS VIA INFUSION
4000.0000 [IU] | Freq: Once | INTRAVENOUS | Status: AC
  Administered 2020-12-12: 4000 [IU] via INTRAVENOUS
  Filled 2020-12-12: qty 4000

## 2020-12-12 MED ORDER — ACETAMINOPHEN 325 MG PO TABS: 650.0000 mg | ORAL_TABLET | Freq: Four times a day (QID) | ORAL | Status: DC | PRN

## 2020-12-12 NOTE — Progress Notes (Signed)
ANTICOAGULATION CONSULT NOTE  Pharmacy Consult for heparin infusion Indication: DVT  No Known Allergies  Patient Measurements: Height: 5\' 6"  (167.6 cm) Weight: 68 kg (150 lb) IBW/kg (Calculated) : 59.3  Vital Signs: Temp: 97.8 F (36.6 C) (12/03 1234) Temp Source: Oral (12/03 1234) BP: 154/77 (12/03 1234) Pulse Rate: 78 (12/03 1234)  Labs: Recent Labs    12/12/20 1238  HGB 8.5*  HCT 28.8*  PLT 330  CREATININE 0.91    Estimated Creatinine Clearance: 36.9 mL/min (by C-G formula based on SCr of 0.91 mg/dL).   Medical History: Past Medical History:  Diagnosis Date   Cellulitis 10/28/2016   bilateral lower extremities.     Assessment: 87 y.o. female with medical history significant for CHF, hypertension, chronic venous stasis of both lower extremities and lymphedema, stage III chronic kidney disease and vitamin B12 deficiency anemia who was brought into the ER for significant LLE edema. According to dispense history (Med Rec not yet complete) she is on no chronic anticoagulation rior to admission. Positive for extensive acute to subacute DVT throughout the left lower extremity from the common femoral vein into the calf veins  Goal of Therapy:  Heparin level 0.3-0.7 units/ml Monitor platelets by anticoagulation protocol: Yes   Plan:  Give 4000 units bolus x 1 Start heparin infusion at 1100 units/hr Check anti-Xa level in 8 hours and daily while on heparin Continue to monitor H&H and platelets  99 12/12/2020,2:09 PM

## 2020-12-12 NOTE — ED Provider Notes (Signed)
[facility] Emergency Department Provider Note  ____________________________________________   Event Date/Time   First MD Initiated Contact with Patient 12/12/20 1231     (approximate)  I have reviewed the triage vital signs and the nursing notes.   HISTORY  Chief Complaint Leg Swelling   HPI [name] [name] is a 87 y.o. female with with medical history significant for CHF, hypertension, chronic venous stasis of both lower extremities and lymphedema, stage III chronic kidney disease and vitamin B12 deficiency, anemia, and recent admission 10/29-11/2 for dehydration generalized weakness who presents via EMS for assessment of persistent and worsening left lower extremity asymmetric edema compared to the right.  Patient denies any pain.  Further history is limited from the patient secondary to her dementia.  I was able to reach patient's son during he states that he wanted his mom checked out for some increased swelling in the left and the last 3 days.  No recent fevers, cough, nausea, vomiting, diarrhea, other swelling or areas of discomfort.  No other concerns present at this time.  They have been using elevation and compression stockings but notes she is not on antibiotics.  No other acute concerns at this time.         Past Medical History:  Diagnosis Date   Cellulitis 10/28/2016   bilateral lower extremities.     Patient Active Problem List   Diagnosis Date Noted   Leukocytosis    Malnutrition of moderate degree 11/09/2020   Pressure injury of skin 11/08/2020   Dementia ([place]) 11/07/2020   Lactic acidosis 11/07/2020   Superficial bruising of arm, left, initial encounter    Acute CHF (congestive heart failure) ([place]) 04/06/2020   HTN (hypertension), malignant    Lymphedema    Fall    Stage 3b chronic kidney disease ([place])    B12 deficiency    Anemia due to vitamin B12 deficiency    Acute metabolic encephalopathy    General weakness    Confusion     Acute UTI 01/07/2020   E. coli UTI 01/07/2020   Chronic venous stasis dermatitis of both lower extremities 01/07/2020   Cellulitis 03/06/2017   Left leg cellulitis 11/25/2016   AKI (acute kidney injury) ([place]) 11/05/2016   Decubitus ulcer of ischium, stage 2, left ([place]) 11/05/2016   Troponin I above reference range 11/04/2016    Past Surgical History:  Procedure Laterality Date   ABDOMINAL HYSTERECTOMY     GANGLION CYST EXCISION      Prior to Admission medications   Medication Sig Start Date End Date Taking? Authorizing Provider  acetaminophen (TYLENOL) 500 MG tablet Take 1 tablet (500 mg total) by mouth every 6 (six) hours as needed for mild pain or headache (fever >/= 101). 10/19/20   Cherene Altes, MD  feeding supplement (ENSURE ENLIVE / ENSURE PLUS) LIQD Take 237 mLs by mouth 3 (three) times daily between meals. 11/10/20   Max Sane, MD  polyethylene glycol (MIRALAX / GLYCOLAX) 17 g packet Take 17 g by mouth 2 (two) times daily. 10/19/20   Cherene Altes, MD  senna-docusate (SENOKOT-S) 8.6-50 MG tablet Take 1 tablet by mouth 2 (two) times daily. 10/19/20   Cherene Altes, MD    Allergies Patient has no known allergies.  Family History  Problem Relation Age of Onset   Diabetes Mellitus II Mother    Heart disease Father     Social History Social History   Tobacco Use   Smoking status: Never  Smokeless tobacco: Never  Substance Use Topics   Alcohol use: No   Drug use: No    Review of Systems  Review of Systems  Unable to perform ROS: Dementia     ____________________________________________   PHYSICAL EXAM:  VITAL SIGNS: ED Triage Vitals  Enc Vitals Group     BP      Pulse      Resp      Temp      Temp src      SpO2      Weight      Height      Head Circumference      Peak Flow      Pain Score      Pain Loc      Pain Edu?      Excl. in [place]?    Vitals:   12/12/20 1234  BP: (!) 154/77  Pulse: 78  Temp: 97.8 F (36.6 C)   SpO2: 98%   Physical Exam Vitals and nursing note reviewed.  Constitutional:      General: She is not in acute distress.    Appearance: She is well-developed.  HENT:     Head: Normocephalic and atraumatic.     Right Ear: External ear normal.     Left Ear: External ear normal.     Nose: Nose normal.  Eyes:     Conjunctiva/sclera: Conjunctivae normal.  Cardiovascular:     Rate and Rhythm: Normal rate and regular rhythm.     Heart sounds: No murmur heard. Pulmonary:     Effort: Pulmonary effort is normal. No respiratory distress.     Breath sounds: Normal breath sounds.  Abdominal:     Palpations: Abdomen is soft.     Tenderness: There is no abdominal tenderness.  Musculoskeletal:        General: No swelling.     Cervical back: Neck supple.     Right lower leg: Edema present.     Left lower leg: Edema present.  Skin:    General: Skin is warm and dry.     Capillary Refill: Capillary refill takes less than 2 seconds.  Neurological:     Mental Status: She is alert.  Psychiatric:        Mood and Affect: Mood normal.    Left lower extremity is significantly more edematous from the toes to the knee compared to the right.  Patient is able to move her toes on both feet.  Sensation is intact light touch lower extremities.  There is a blister on the back of the left heel. ____________________________________________   LABS (all labs ordered are listed, but only abnormal results are displayed)  Labs Reviewed  CBC WITH DIFFERENTIAL/PLATELET - Abnormal; Notable for the following components:      Result Value   RBC 2.92 (*)    Hemoglobin 8.5 (*)    HCT 28.8 (*)    MCHC 29.5 (*)    All other components within normal limits  BASIC METABOLIC PANEL - Abnormal; Notable for the following components:   BUN 25 (*)    Calcium 8.3 (*)    GFR, Estimated 59 (*)    All other components within normal limits  RESP PANEL BY RT-PCR (FLU A&B, COVID) [id number]    ____________________________________________  EKG  ____________________________________________  RADIOLOGY  ED MD interpretation:    X-ray of the left ankle shows some degenerative changes and soft tissue swelling without any underlying bony abnormality.  Patient.  Ultrasound of  lower extremity shows very extensive DVT including up to the common femoral no abscess or other acute process.   Official radiology report(s): DG Ankle Complete Left  Result Date: 12/12/2020 CLINICAL DATA:  A 87 year old female presents for evaluation of cellulitis in the lower leg. Reportedly with significant swelling in the LEFT lower leg. EXAM: LEFT ANKLE COMPLETE - 3+ VIEW COMPARISON:  Tibia and fibula evaluation from 2020. FINDINGS: Degenerative changes are noted about the ankle. Plantar and Achilles enthesopathic changes are noted. Diffuse soft tissue swelling about the visualized LEFT lower extremity. No acute fracture or bony abnormality. IMPRESSION: Degenerative changes about the ankle. Diffuse soft tissue swelling about the visualized LEFT lower extremity. No acute bony abnormality. Electronically Signed   By: Zetta Bills M.D.   On: 12/12/2020 13:23   US Venous Img Lower Unilateral Left  Result Date: 12/12/2020 CLINICAL DATA:  Left lower extremity edema EXAM: LEFT LOWER EXTREMITY VENOUS DOPPLER ULTRASOUND TECHNIQUE: Gray-scale sonography with graded compression, as well as color Doppler and duplex ultrasound were performed to evaluate the lower extremity deep venous systems from the level of the common femoral vein and including the common femoral, femoral, profunda femoral, popliteal and calf veins including the posterior tibial, peroneal and gastrocnemius veins when visible. The superficial great saphenous vein was also interrogated. Spectral Doppler was utilized to evaluate flow at rest and with distal augmentation maneuvers in the common femoral, femoral and popliteal veins. COMPARISON:  None.  FINDINGS: Contralateral Common Femoral Vein: Respiratory phasicity is normal and symmetric with the symptomatic side. No evidence of thrombus. Normal compressibility. Common Femoral Vein: Noncompressible. The lumen is expanded and filled with low-level echogenic material. Scattered channels of flow are present on color Doppler imaging. Findings are consistent with acute to subacute thrombus. Saphenofemoral Junction: Thrombus extends just into the origin of the saphenofemoral junction. Profunda Femoral Vein: Thrombus extends into the profunda femoral vein. Femoral Vein: Thrombus extends throughout the femoral vein in the thigh with areas of occlusive thrombus in the mid and distal thigh. Popliteal Vein: Thrombus extends through the popliteal vein. Calf Veins: Thrombus extends into the calf veins. Superficial Great Saphenous Vein: No evidence of thrombus. Normal compressibility. Venous Reflux:  None. Other Findings:  None. IMPRESSION: Positive for extensive acute to subacute DVT throughout the left lower extremity from the common femoral vein into the calf veins. Electronically Signed   By: Jacqulynn Cadet M.D.   On: 12/12/2020 13:41    ____________________________________________   PROCEDURES  Procedure(s) performed (including Critical Care):  Procedures   ____________________________________________   INITIAL IMPRESSION / ASSESSMENT AND PLAN / ED COURSE      Patient presents with above-stated history exam for increased swelling of lower extremity compared to the right of the last couple days.  He apparently has a history of chronic venous stasis and recurrent lower extremity cellulitis but then feels that this is different than usual swelling she has.  History is fairly limited from the patient secondary to her underlying dementia.  On arrival she is alert hypertensive with stable vital signs on room air.  Evidence of significant edema she has a blister back of the left heel.  Patient's  blister on the left heel does appear slightly purulent hernia is a little bit of surrounding erythema concerning for cellulitis.  X-ray of the left ankle shows some degenerative changes and soft tissue swelling without any underlying bony abnormality.  Lower suspicion for osteomyelitis at this time.  Ultrasound of lower extremity shows very extensive DVT including up  to the common femoral no abscess or other acute process.   CBC shows no acute cytosis and stable anemia.  Normal platelets.  BMP shows no significant electrolyte or metabolic derangements.  Discussed with on-call vascular surgeon Dr.   Docia Barrier   Recommended initiating heparin drip and admission to hospitalist service.  Admit to hospital services for evaluation and management.  Complete patient is septic from cellulitis in her left heel started on Doxy for this.  I will admit to medicine service for further evaluation and management.     ____________________________________________   FINAL CLINICAL IMPRESSION(S) / ED DIAGNOSES  Final diagnoses:  Acute deep vein thrombosis (DVT) of proximal vein of left lower extremity (HCC)  Cellulitis, unspecified cellulitis site    Medications  doxycycline (VIBRA-TABS) tablet 100 mg (has no administration in time range)     ED Discharge Orders     None        Note:  This document was prepared using Dragon voice recognition software and may include unintentional dictation errors.    Gilles Chiquito, MD 12/12/20 [phone]

## 2020-12-12 NOTE — Consult Note (Signed)
Reason for Consult:Left lower limb DVT Referring Physician: Lorretta Harp, MD  [name] [name] is an 87 y.o. female.  HPI: This is a 87 year old lady that lives at home independently and states that yesterday the left leg started to swell significantly. She has had leg swelling before,but not like this. She comes in via the ER.  Past Medical History:  Diagnosis Date   Cellulitis 10/28/2016   bilateral lower extremities.     Past Surgical History:  Procedure Laterality Date   ABDOMINAL HYSTERECTOMY     GANGLION CYST EXCISION      Family History  Problem Relation Age of Onset   Diabetes Mellitus II Mother    Heart disease Father     Social History:  reports that she has never smoked. She has never used smokeless tobacco. She reports that she does not drink alcohol and does not use drugs.  Allergies: No Known Allergies  Medications: I have reviewed the patient's current medications. Prior to Admission: (Not in a hospital admission)  Scheduled:  heparin  4,000 Units Intravenous Once   Continuous:  heparin      Results for orders placed or performed during the hospital encounter of 12/12/20 (from the past 48 hour(s))  CBC with Differential     Status: Abnormal   Collection Time: 12/12/20 12:38 PM  Result Value Ref Range   WBC 4.5 4.0 - 10.5 K/uL   RBC 2.92 (L) 3.87 - 5.11 MIL/uL   Hemoglobin 8.5 (L) 12.0 - 15.0 g/dL   HCT 50.9 (L) 32.6 - 71.2 %   MCV 98.6 80.0 - 100.0 fL   MCH 29.1 26.0 - 34.0 pg   MCHC 29.5 (L) 30.0 - 36.0 g/dL   RDW 45.8 09.9 - 83.3 %   Platelets 330 150 - 400 K/uL   nRBC 0.0 0.0 - 0.2 %   Neutrophils Relative % 52 %   Neutro Abs 2.3 1.7 - 7.7 K/uL   Lymphocytes Relative 32 %   Lymphs Abs 1.4 0.7 - 4.0 K/uL   Monocytes Relative 9 %   Monocytes Absolute 0.4 0.1 - 1.0 K/uL   Eosinophils Relative 6 %   Eosinophils Absolute 0.3 0.0 - 0.5 K/uL   Basophils Relative 1 %   Basophils Absolute 0.1 0.0 - 0.1 K/uL   Immature Granulocytes 0 %   Abs Immature  Granulocytes 0.01 0.00 - 0.07 K/uL    Comment: Performed at [facility], [address] [place], [place] [address]  Basic metabolic panel     Status: Abnormal   Collection Time: 12/12/20 12:38 PM  Result Value Ref Range   Sodium 139 135 - 145 mmol/L   Potassium 4.4 3.5 - 5.1 mmol/L    Comment: HEMOLYSIS AT THIS LEVEL MAY AFFECT RESULT   Chloride 109 98 - 111 mmol/L   CO2 22 22 - 32 mmol/L   Glucose, Bld 76 70 - 99 mg/dL    Comment: Glucose reference range applies only to samples taken after fasting for at least 8 hours.   BUN 25 (H) 8 - 23 mg/dL   Creatinine, Ser 3.97 0.44 - 1.00 mg/dL   Calcium 8.3 (L) 8.9 - 10.3 mg/dL   GFR, Estimated 59 (L) >60 mL/min    Comment: (NOTE) Calculated using the CKD-EPI Creatinine Equation (2021)    Anion gap 8 5 - 15    Comment: Performed at [facility], [address], [place], [place] [address]  Sedimentation rate     Status:  Abnormal   Collection Time: 12/12/20  2:10 PM  Result Value Ref Range   Sed Rate 54 (H) 0 - 30 mm/hr    Comment: Performed at [facility], [address] [place], [place] [address]    DG Ankle Complete Left  Result Date: 12/12/2020 CLINICAL DATA:  A 87 year old female presents for evaluation of cellulitis in the lower leg. Reportedly with significant swelling in the LEFT lower leg. EXAM: LEFT ANKLE COMPLETE - 3+ VIEW COMPARISON:  Tibia and fibula evaluation from 2020. FINDINGS: Degenerative changes are noted about the ankle. Plantar and Achilles enthesopathic changes are noted. Diffuse soft tissue swelling about the visualized LEFT lower extremity. No acute fracture or bony abnormality. IMPRESSION: Degenerative changes about the ankle. Diffuse soft tissue swelling about the visualized LEFT lower extremity. No acute bony abnormality. Electronically Signed   By: Donzetta Kohut M.D.   On: 12/12/2020 13:23   US Venous Img Lower Unilateral Left  Result Date: 12/12/2020 CLINICAL DATA:  Left lower  extremity edema EXAM: LEFT LOWER EXTREMITY VENOUS DOPPLER ULTRASOUND TECHNIQUE: Gray-scale sonography with graded compression, as well as color Doppler and duplex ultrasound were performed to evaluate the lower extremity deep venous systems from the level of the common femoral vein and including the common femoral, femoral, profunda femoral, popliteal and calf veins including the posterior tibial, peroneal and gastrocnemius veins when visible. The superficial great saphenous vein was also interrogated. Spectral Doppler was utilized to evaluate flow at rest and with distal augmentation maneuvers in the common femoral, femoral and popliteal veins. COMPARISON:  None. FINDINGS: Contralateral Common Femoral Vein: Respiratory phasicity is normal and symmetric with the symptomatic side. No evidence of thrombus. Normal compressibility. Common Femoral Vein: Noncompressible. The lumen is expanded and filled with low-level echogenic material. Scattered channels of flow are present on color Doppler imaging. Findings are consistent with acute to subacute thrombus. Saphenofemoral Junction: Thrombus extends just into the origin of the saphenofemoral junction. Profunda Femoral Vein: Thrombus extends into the profunda femoral vein. Femoral Vein: Thrombus extends throughout the femoral vein in the thigh with areas of occlusive thrombus in the mid and distal thigh. Popliteal Vein: Thrombus extends through the popliteal vein. Calf Veins: Thrombus extends into the calf veins. Superficial Great Saphenous Vein: No evidence of thrombus. Normal compressibility. Venous Reflux:  None. Other Findings:  None. IMPRESSION: Positive for extensive acute to subacute DVT throughout the left lower extremity from the common femoral vein into the calf veins. Electronically Signed   By: Malachy Moan M.D.   On: 12/12/2020 13:41    Review of Systems  All other systems reviewed and are negative. Blood pressure (!) 154/77, pulse 78, temperature 97.8  F (36.6 C), temperature source Oral, height 5\' 6"  (1.676 m), weight 68 kg, SpO2 98 %. Physical Exam Constitutional:      Appearance: Normal appearance. She is normal weight.  HENT:     Head: Normocephalic and atraumatic.     Nose: Nose normal.  Eyes:     Extraocular Movements: Extraocular movements intact.     Conjunctiva/sclera: Conjunctivae normal.     Pupils: Pupils are equal, round, and reactive to light.  Cardiovascular:     Rate and Rhythm: Normal rate and regular rhythm.     Pulses:          Dorsalis pedis pulses are 1+ on the right side and 1+ on the left side.       Posterior tibial pulses are 1+ on the right side and 1+ on  the left side.  Pulmonary:     Effort: Pulmonary effort is normal.     Breath sounds: Normal breath sounds.  Abdominal:     General: Abdomen is flat. Bowel sounds are normal.     Palpations: Abdomen is soft.  Musculoskeletal:        General: Normal range of motion.     Cervical back: Normal range of motion and neck supple.     Right lower leg: 1+ Edema present.     Left lower leg: 3+ Edema present.       Legs:       Feet:  Feet:     Right foot:     Skin integrity: Skin integrity normal.     Toenail Condition: Right toenails are abnormally thick.     Left foot:     Skin integrity: Blister present.     Toenail Condition: Left toenails are abnormally thick.  Skin:    General: Skin is warm and dry.  Neurological:     General: No focal deficit present.     Mental Status: She is alert and oriented to person, place, and time.  Psychiatric:        Attention and Perception: Attention and perception normal.        Mood and Affect: Affect is blunt and angry.        Speech: Speech normal.        Behavior: Behavior normal.        Thought Content: Thought content normal.        Cognition and Memory: Cognition normal.        Judgment: Judgment is inappropriate.    Assessment/Plan: Lower limb acute/subacute DVT involving the common femoral vein to  the calf veins.  Recommend anticoagulation with Heparin, leg elevation, and hyper coagulable workup, malignancy should be ruled out.    Louisa Second 12/12/2020, 2:49 PM

## 2020-12-12 NOTE — H&P (Signed)
History and Physical    [name] [name] [id number] DOB: 1926-03-01 DOA: 12/12/2020  Referring MD/NP/PA:   PCP: Leonel Ramsay, MD   Patient coming from:  The patient is coming from home.   Chief Complaint: worsening leg swelling  HPI: [name] [name] is a 87 y.o. female with medical history significant of dCHF, chronic venous insufficiency change and lymphedema in both legs, CKD stage IIIb, dementia, leg cellulitis, UTI, hypertension, anemia due to vitamin B-12 deficiency, who presents with worsening leg edema.  Patient has history of chronic venous insufficiency change and lymphedema in both legs. Per his son (I called her son by phone), patient was noted to have worsening left leg edema in the past several days.  Her leg edema is very asymmetric. Patient has left leg pain.  No fever or chills.  Denies chest pain, cough, shortness breath.  Denies nausea, vomiting, diarrhea or abdominal pain.  Denies symptoms of UTI.  Patient has a big blister in left heel, no surrounding erythema.  No recent fall.  No rectal bleeding or dark stool.  ED Course: pt was found to have WBC 4.5, electrolytes renal function okay, temperature normal, blood pressure 154/77, heart rate 78, oxygen saturation 98% on room air.  X-ray of left ankle is negative for bony fracture, but showed degenerative change.  LE Doppler showed left leg extensive DVT, positive for extensive acute to subacute DVT throughout the left lower extremity from the common femoral vein into the calf veins. Patient is admit to to med-Surg bed as inpt. Dr. Feliberto Gottron of [facility] is consulted.   Review of Systems:   General: no fevers, chills, no body weight gain, fatigue HEENT: no blurry vision, hearing changes or sore throat Respiratory: no dyspnea, coughing, wheezing CV: no chest pain, no palpitations GI: no nausea, vomiting, abdominal pain, diarrhea, constipation GU: no dysuria, burning on urination, increased urinary frequency, hematuria   Ext: has asymmetric leg edema.  Has a blister in left heel. Neuro: no unilateral weakness, numbness, or tingling, no vision change or hearing loss Skin: no rash, no skin tear. MSK: No muscle spasm, no deformity, no limitation of range of movement in spin Heme: No easy bruising.  Travel history: No recent long distant travel.  Allergy: No Known Allergies  Past Medical History:  Diagnosis Date   Cellulitis 10/28/2016   bilateral lower extremities.     Past Surgical History:  Procedure Laterality Date   ABDOMINAL HYSTERECTOMY     GANGLION CYST EXCISION      Social History:  reports that she has never smoked. She has never used smokeless tobacco. She reports that she does not drink alcohol and does not use drugs.  Family History:  Family History  Problem Relation Age of Onset   Diabetes Mellitus II Mother    Heart disease Father      Prior to Admission medications   Medication Sig Start Date End Date Taking? Authorizing Provider  acetaminophen (TYLENOL) 500 MG tablet Take 1 tablet (500 mg total) by mouth every 6 (six) hours as needed for mild pain or headache (fever >/= 101). 10/19/20   Cherene Altes, MD  feeding supplement (ENSURE ENLIVE / ENSURE PLUS) LIQD Take 237 mLs by mouth 3 (three) times daily between meals. 11/10/20   Max Sane, MD  polyethylene glycol (MIRALAX / GLYCOLAX) 17 g packet Take 17 g by mouth 2 (two) times daily. 10/19/20   Cherene Altes, MD  senna-docusate (SENOKOT-S) 8.6-50 MG tablet Take 1 tablet by mouth  2 (two) times daily. 10/19/20   Lonia Blood, MD    Physical Exam: Vitals:   12/12/20 1234 12/12/20 1244  BP: (!) 154/77   Pulse: 78   Temp: 97.8 F (36.6 C)   TempSrc: Oral   SpO2: 98%   Weight:  68 kg  Height:  5\' 6"  (1.676 m)   General: Not in acute distress HEENT:       Eyes: PERRL, EOMI, no scleral icterus.       ENT: No discharge from the ears and nose, no pharynx injection, no tonsillar enlargement.        Neck: No  JVD, no bruit, no mass felt. Heme: No neck lymph node enlargement. Cardiac: S1/S2, RRR, No murmurs, No gallops or rubs. Respiratory: No rales, wheezing, rhonchi or rubs. GI: Soft, nondistended, nontender, no rebound pain, no organomegaly, BS present. GU: No hematuria Ext:  has a blister in left heel without surrounding erythema.  Has asymmetric leg edema, 1+ in right leg and 3+ in left leg.           Musculoskeletal: No joint deformities, No joint redness or warmth, no limitation of ROM in spin. Skin: No rashes.  Neuro: Alert, oriented X3, cranial nerves II-XII grossly intact, moves all extremities normally. Psych: Patient is not psychotic, no suicidal or hemocidal ideation.  Labs on Admission: I have personally reviewed following labs and imaging studies  CBC: Recent Labs  Lab 12/12/20 1238  WBC 4.5  NEUTROABS 2.3  HGB 8.5*  HCT 28.8*  MCV 98.6  PLT 330   Basic Metabolic Panel: Recent Labs  Lab 12/12/20 1238  NA 139  K 4.4  CL 109  CO2 22  GLUCOSE 76  BUN 25*  CREATININE 0.91  CALCIUM 8.3*   GFR: Estimated Creatinine Clearance: 36.9 mL/min (by C-G formula based on SCr of 0.91 mg/dL). Liver Function Tests: No results for input(s): AST, ALT, ALKPHOS, BILITOT, PROT, ALBUMIN in the last 168 hours. No results for input(s): LIPASE, AMYLASE in the last 168 hours. No results for input(s): AMMONIA in the last 168 hours. Coagulation Profile: No results for input(s): INR, PROTIME in the last 168 hours. Cardiac Enzymes: No results for input(s): CKTOTAL, CKMB, CKMBINDEX, TROPONINI in the last 168 hours. BNP (last 3 results) No results for input(s): PROBNP in the last 8760 hours. HbA1C: No results for input(s): HGBA1C in the last 72 hours. CBG: No results for input(s): GLUCAP in the last 168 hours. Lipid Profile: No results for input(s): CHOL, HDL, LDLCALC, TRIG, CHOLHDL, LDLDIRECT in the last 72 hours. Thyroid Function Tests: No results for input(s): TSH,  T4TOTAL, FREET4, T3FREE, THYROIDAB in the last 72 hours. Anemia Panel: No results for input(s): VITAMINB12, FOLATE, FERRITIN, TIBC, IRON, RETICCTPCT in the last 72 hours. Urine analysis:    Component Value Date/Time   COLORURINE AMBER (A) 11/07/2020 0400   APPEARANCEUR TURBID (A) 11/07/2020 0400   LABSPEC 1.013 11/07/2020 0400   PHURINE 5.0 11/07/2020 0400   GLUCOSEU NEGATIVE 11/07/2020 0400   HGBUR SMALL (A) 11/07/2020 0400   BILIRUBINUR NEGATIVE 11/07/2020 0400   KETONESUR NEGATIVE 11/07/2020 0400   PROTEINUR 100 (A) 11/07/2020 0400   NITRITE NEGATIVE 11/07/2020 0400   LEUKOCYTESUR LARGE (A) 11/07/2020 0400   Sepsis Labs: @LABRCNTIP (procalcitonin:4,lacticidven:4) )No results found for this or any previous visit (from the past 240 hour(s)).   Radiological Exams on Admission: DG Ankle Complete Left  Result Date: 12/12/2020 CLINICAL DATA:  A 87 year old female presents for evaluation of cellulitis in the lower  leg. Reportedly with significant swelling in the LEFT lower leg. EXAM: LEFT ANKLE COMPLETE - 3+ VIEW COMPARISON:  Tibia and fibula evaluation from 2020. FINDINGS: Degenerative changes are noted about the ankle. Plantar and Achilles enthesopathic changes are noted. Diffuse soft tissue swelling about the visualized LEFT lower extremity. No acute fracture or bony abnormality. IMPRESSION: Degenerative changes about the ankle. Diffuse soft tissue swelling about the visualized LEFT lower extremity. No acute bony abnormality. Electronically Signed   By: Zetta Bills M.D.   On: 12/12/2020 13:23   US Venous Img Lower Unilateral Left  Result Date: 12/12/2020 CLINICAL DATA:  Left lower extremity edema EXAM: LEFT LOWER EXTREMITY VENOUS DOPPLER ULTRASOUND TECHNIQUE: Gray-scale sonography with graded compression, as well as color Doppler and duplex ultrasound were performed to evaluate the lower extremity deep venous systems from the level of the common femoral vein and including the common  femoral, femoral, profunda femoral, popliteal and calf veins including the posterior tibial, peroneal and gastrocnemius veins when visible. The superficial great saphenous vein was also interrogated. Spectral Doppler was utilized to evaluate flow at rest and with distal augmentation maneuvers in the common femoral, femoral and popliteal veins. COMPARISON:  None. FINDINGS: Contralateral Common Femoral Vein: Respiratory phasicity is normal and symmetric with the symptomatic side. No evidence of thrombus. Normal compressibility. Common Femoral Vein: Noncompressible. The lumen is expanded and filled with low-level echogenic material. Scattered channels of flow are present on color Doppler imaging. Findings are consistent with acute to subacute thrombus. Saphenofemoral Junction: Thrombus extends just into the origin of the saphenofemoral junction. Profunda Femoral Vein: Thrombus extends into the profunda femoral vein. Femoral Vein: Thrombus extends throughout the femoral vein in the thigh with areas of occlusive thrombus in the mid and distal thigh. Popliteal Vein: Thrombus extends through the popliteal vein. Calf Veins: Thrombus extends into the calf veins. Superficial Great Saphenous Vein: No evidence of thrombus. Normal compressibility. Venous Reflux:  None. Other Findings:  None. IMPRESSION: Positive for extensive acute to subacute DVT throughout the left lower extremity from the common femoral vein into the calf veins. Electronically Signed   By: Jacqulynn Cadet M.D.   On: 12/12/2020 13:41     EKG: Not done in ED, will get one.   Assessment/Plan Principal Problem:   DVT (deep venous thrombosis) (HCC) Active Problems:   Anemia due to vitamin B12 deficiency   Stage 3b chronic kidney disease (HCC)   Chronic diastolic CHF (congestive heart failure) (HCC)   HTN (hypertension)   Blister of left foot   DVT (deep venous thrombosis) ([place]): LE doppler is positive for extensive acute to subacute DVT  throughout the left lower extremity from the common femoral vein into the calf veins.  Patient does not have chest pain or shortness of breath.  Oxygen saturation 98% on room air.  Low suspicions for PE.  -Admitted to MedSurg bed as inpatient -IV heparin started in ED -As needed Tylenol and Percocet for pain -Consulted Dr. Feliberto Gottron of [facility] -elevation of left leg per Dr. Feliberto Gottron  Anemia due to vitamin B12 deficiency: Hemoglobin stable, 8.5 (8.8 on 11/10/2020) -f/u with CBC  Stage 3b chronic kidney disease ([place]): Stable.  Creatinine 0.91, BUN 25. -Follow-up with BMP  Chronic diastolic CHF (congestive heart failure) ([place]): 2D echo on 04/08/2020 showed EF of 60 to 65% with grade 2 diastolic dysfunction.  Patient has leg edema, but no JVD.  No shortness of breath.  CHF seem to be compensated.  Patient is not taking diuretics currently. -Check  BNP -Watch volume status closely  HTN (hypertension):  Blood pressure 154/77. Patient is not taking medications currently.  -IV hydralazine prn  Blister of left foot: Patient has a large blister in the left heel, no significant surrounding erythema, no fever or leukocytosis, does not seem to be infected.  -Consult wound care   DVT ppx: on IV Heparin     Code Status: DNR per her son Family Communication: Yes, patient's  son by phone Disposition Plan:  Anticipate discharge back to previous environment Consults called:  Dr. Feliberto [facility]tron of [facility] is consulted. Admission status and Level of care: Telemetry Medical:    Med-surg bed for obs as inpt    progressive unit for obs   as inpt      SDU/inpation        Status is: Inpatient  Remains inpatient appropriate because: Patient has multiple comorbidities, now presents with very extensive left leg DVT.  Given her older age, patient is at high risk of deteriorating.  Will need to be treated in hospital for at least 2 days.         Date of Service 12/12/2020    Ivor Costa [facility]   If  7PM-7AM, please contact night-coverage [email or web address] 12/12/2020, 2:51 PM

## 2020-12-12 NOTE — Progress Notes (Signed)
A deep tissue injury was noted upon physiscal assessment on the L heel of the pt. Wound consult was already ordered by MD.

## 2020-12-12 NOTE — ED Triage Notes (Signed)
"  From home, here a few weeks ago and diagnosed with cellulitis in her legs, and now her left lower leg has had significant swelling for the past 3 days" per EMS

## 2020-12-13 DIAGNOSIS — F0390 Unspecified dementia without behavioral disturbance: Secondary | ICD-10-CM

## 2020-12-13 DIAGNOSIS — S90822A Blister (nonthermal), left foot, initial encounter: Secondary | ICD-10-CM

## 2020-12-13 LAB — BASIC METABOLIC PANEL
Anion gap: 4 — ABNORMAL LOW (ref 5–15)
BUN: 19 mg/dL (ref 8–23)
CO2: 26 mmol/L (ref 22–32)
Calcium: 8.4 mg/dL — ABNORMAL LOW (ref 8.9–10.3)
Chloride: 109 mmol/L (ref 98–111)
Creatinine, Ser: 0.82 mg/dL (ref 0.44–1.00)
GFR, Estimated: >60 mL/min (ref >60)
Glucose, Bld: 95 mg/dL (ref 70–99)
Potassium: 4 mmol/L (ref 3.5–5.1)
Sodium: 139 mmol/L (ref 135–145)

## 2020-12-13 LAB — CBC
HCT: 23.6 % — ABNORMAL LOW (ref 36.0–46.0)
Hemoglobin: 7.1 g/dL — ABNORMAL LOW (ref 12.0–15.0)
MCH: 28.9 pg (ref 26.0–34.0)
MCHC: 30.1 g/dL (ref 30.0–36.0)
MCV: 95.9 fL (ref 80.0–100.0)
Platelets: 287 10*3/uL (ref 150–400)
RBC: 2.46 MIL/uL — ABNORMAL LOW (ref 3.87–5.11)
RDW: 15.1 % (ref 11.5–15.5)
WBC: 4.6 10*3/uL (ref 4.0–10.5)
nRBC: 0 % (ref 0.0–0.2)

## 2020-12-13 LAB — HEPARIN LEVEL (UNFRACTIONATED)
Heparin Unfractionated: 0.43 IU/mL (ref 0.30–0.70)
Heparin Unfractionated: 0.68 IU/mL (ref 0.30–0.70)
Heparin Unfractionated: 0.4 IU/mL (ref 0.30–0.70)

## 2020-12-13 LAB — GLUCOSE, CAPILLARY: Glucose-Capillary: 86 mg/dL (ref 70–99)

## 2020-12-13 LAB — VITAMIN B12: Vitamin B-12: 452 pg/mL (ref 180–914)

## 2020-12-13 MED ORDER — ADULT MULTIVITAMIN W/MINERALS CH
1.0000 | ORAL_TABLET | Freq: Every day | ORAL | Status: DC
  Administered 2020-12-13 – 2020-12-15 (×2): 1 via ORAL
  Filled 2020-12-13 (×3): qty 1

## 2020-12-13 NOTE — TOC Initial Note (Addendum)
Transition of Care [facility]) - Initial/Assessment Note    Patient Details  Name: [name] [name] MRN: [id number] Date of Birth: [birth year]  [facility]) CM/SW Contact:    Pete Pelt, RN Phone Number: 12/13/2020, 2:53 PM  Clinical Narrative:      Patient lives with son.  [address], [place], [place] [address] Telephone: [phone]  FAX: [phone]         Addendum 1459:  Patient was only oriented to self when [facility] spoke with her.  She was pleasant, and stated she was not sure if she lived with anyone or not, stated she drives a car and walks with a walker.  She was unsure how she got to her appointments and if she was warm or cold when she pulled blankets off.  She was gracious for services and smiling.  Spoke with patient's son via phone.  [name] [name], [name] [name] (Son)  [phone] [facility] Phone).  Son states that patient lives with him, she is currently bedridden, does not walk or drive.  He cares for her except when at work, at which time 56 cares for her.  He transport to appointments by bringing patient out in wheelchair and lifting her into the car.    No reported concerns with medications at this time.  He is amenable to additional home health if recommended, but is comfortable with patient returning home after discharge.  TOC contact information provided, TOC to follow to discharge.  Expected Discharge Plan: [place] Barriers to Discharge: Continued Medical Work up   Patient Goals and CMS Choice        Expected Discharge Plan and Services Expected Discharge Plan: [place]                                              Prior Living Arrangements/Services                       Activities of Daily Living Home Assistive Devices/Equipment: Bedside commode/3-in-1 ADL Screening (condition at time of admission) Patient's cognitive ability adequate to safely  complete daily activities?: Yes Is the patient deaf or have difficulty hearing?: No Does the patient have difficulty seeing, even when wearing glasses/contacts?: No Does the patient have difficulty concentrating, remembering, or making decisions?: No Patient able to express need for assistance with ADLs?: Yes Does the patient have difficulty dressing or bathing?: Yes Independently performs ADLs?: No Does the patient have difficulty walking or climbing stairs?: Yes Weakness of Legs: Both Weakness of Arms/Hands: None  Permission Sought/Granted                  Emotional Assessment              Admission diagnosis:  DVT (deep venous thrombosis) (HCC) [I82.409] Acute deep vein thrombosis (DVT) of proximal vein of left lower extremity ([place]) [I82.4Y2] Cellulitis, unspecified cellulitis site [L03.90] Patient Active Problem List   Diagnosis Date Noted   DVT (deep venous thrombosis) ([place]) 12/12/2020   HTN (hypertension) 12/12/2020   Blister of left foot 12/12/2020   Leukocytosis    Malnutrition of moderate degree 11/09/2020   Pressure injury of skin 11/08/2020   Dementia ([place]) 11/07/2020   Lactic acidosis 11/07/2020   Superficial bruising of arm, left, initial encounter  Chronic diastolic CHF (congestive heart failure) (HCC) 04/06/2020   HTN (hypertension), malignant    Lymphedema    Fall    Stage 3b chronic kidney disease (HCC)    B12 deficiency    Anemia due to vitamin B12 deficiency    Acute metabolic encephalopathy    General weakness    Confusion    Acute UTI 01/07/2020   E. coli UTI 01/07/2020   Chronic venous stasis dermatitis of both lower extremities 01/07/2020   Cellulitis 03/06/2017   Left leg cellulitis 11/25/2016   AKI (acute kidney injury) (HCC) 11/05/2016   Decubitus ulcer of ischium, stage 2, left (HCC) 11/05/2016   Troponin I above reference range 11/04/2016   PCP:  Mick Sell, MD Pharmacy:   [facility] [address]), [place] -  [address] Jer[place]nn [place]) [place] [address] Phone: [phone] Fax: [phone]     Social Determinants of Health (SDOH) Interventions    Readmission Risk Interventions No flowsheet data found.

## 2020-12-13 NOTE — Progress Notes (Addendum)
Subjective/Chief Complaint: Patient feeling better, continues to have swelling of the left lower limb.Marland Kitchen Has been elevating leg on a pillow.    Objective: Vital signs in last 24 hours: Temp:  [97.6 F (36.4 C)-98.2 F (36.8 C)] 98.2 F (36.8 C) (12/04 0734) Pulse Rate:  [67-78] 67 (12/04 0734) Resp:  [16-18] 16 (12/04 0734) BP: (113-154)/(58-86) 130/58 (12/04 0734) SpO2:  [97 %-100 %] 99 % (12/04 0734) Weight:  [68 kg-84.7 kg] 84.7 kg (12/04 0500) Last BM Date: 12/12/20  Intake/Output from previous day: 12/03 0701 - 12/04 0700 In: 289.4 [P.O.:240; I.V.:49.4] Out: -  Intake/Output this shift: Total I/O In: 120 [P.O.:120] Out: 400 [Urine:400]  General appearance: alert, cooperative, appears stated age, and no distress Resp: clear to auscultation bilaterally Cardio: regular rate and rhythm, S1, S2 normal, no murmur, click, rub or gallop Extremities: Significant swelling is noted in the left lower extremity compared to the right lower extremity.  Patient has a pressure blister of the left heel.  Skin still intact.  The patient's left leg is elevated with a pillow.  Lab Results:  Recent Labs    12/12/20 1238 12/13/20 0140  WBC 4.5 4.6  HGB 8.5* 7.1*  HCT 28.8* 23.6*  PLT 330 287   BMET Recent Labs    12/12/20 1238 12/13/20 1043  NA 139 139  K 4.4 4.0  CL 109 109  CO2 22 26  GLUCOSE 76 95  BUN 25* 19  CREATININE 0.91 0.82  CALCIUM 8.3* 8.4*   PT/INR Recent Labs    12/12/20 1452  LABPROT 14.9  INR 1.2   ABG No results for input(s): PHART, HCO3 in the last 72 hours.  Invalid input(s): PCO2, PO2  Studies/Results: DG Ankle Complete Left  Result Date: 12/12/2020 CLINICAL DATA:  A 87 year old female presents for evaluation of cellulitis in the lower leg. Reportedly with significant swelling in the LEFT lower leg. EXAM: LEFT ANKLE COMPLETE - 3+ VIEW COMPARISON:  Tibia and fibula evaluation from 2020. FINDINGS: Degenerative changes are noted about the  ankle. Plantar and Achilles enthesopathic changes are noted. Diffuse soft tissue swelling about the visualized LEFT lower extremity. No acute fracture or bony abnormality. IMPRESSION: Degenerative changes about the ankle. Diffuse soft tissue swelling about the visualized LEFT lower extremity. No acute bony abnormality. Electronically Signed   By: Donzetta Kohut M.D.   On: 12/12/2020 13:23   US Venous Img Lower Unilateral Left  Result Date: 12/12/2020 CLINICAL DATA:  Left lower extremity edema EXAM: LEFT LOWER EXTREMITY VENOUS DOPPLER ULTRASOUND TECHNIQUE: Gray-scale sonography with graded compression, as well as color Doppler and duplex ultrasound were performed to evaluate the lower extremity deep venous systems from the level of the common femoral vein and including the common femoral, femoral, profunda femoral, popliteal and calf veins including the posterior tibial, peroneal and gastrocnemius veins when visible. The superficial great saphenous vein was also interrogated. Spectral Doppler was utilized to evaluate flow at rest and with distal augmentation maneuvers in the common femoral, femoral and popliteal veins. COMPARISON:  None. FINDINGS: Contralateral Common Femoral Vein: Respiratory phasicity is normal and symmetric with the symptomatic side. No evidence of thrombus. Normal compressibility. Common Femoral Vein: Noncompressible. The lumen is expanded and filled with low-level echogenic material. Scattered channels of flow are present on color Doppler imaging. Findings are consistent with acute to subacute thrombus. Saphenofemoral Junction: Thrombus extends just into the origin of the saphenofemoral junction. Profunda Femoral Vein: Thrombus extends into the profunda femoral vein. Femoral Vein: Thrombus extends  throughout the femoral vein in the thigh with areas of occlusive thrombus in the mid and distal thigh. Popliteal Vein: Thrombus extends through the popliteal vein. Calf Veins: Thrombus extends into  the calf veins. Superficial Great Saphenous Vein: No evidence of thrombus. Normal compressibility. Venous Reflux:  None. Other Findings:  None. IMPRESSION: Positive for extensive acute to subacute DVT throughout the left lower extremity from the common femoral vein into the calf veins. Electronically Signed   By: Malachy Moan M.D.   On: 12/12/2020 13:41    Anti-infectives: Anti-infectives (From admission, onward)    Start     Dose/Rate Route Frequency Ordered Stop   12/12/20 1400  doxycycline (VIBRA-TABS) tablet 100 mg  Status:  Discontinued        100 mg Oral  Once 12/12/20 1352 12/12/20 1443       Assessment/Plan: s/p * No surgery found * This is a 87 year old lady with a left lower extremity extensive deep venous thrombosis involving the common femoral vein into the calf veins.Continue with heparin drip , keep n.p.o. after midnight for possible mechanical thrombolysis versus chemical thrombolysis of the left lower extremity.  [name] [name] 12/13/2020

## 2020-12-13 NOTE — Consult Note (Signed)
WOC Nurse Consult Note: Patient receiving care in [facility]. Consult completed remotely after review of record and photo. Reason for Consult: left heel blister Wound type: unclear etiology. Patient underwent a LLE [place] yesterday with the following findings: "IMPRESSION: Positive for extensive acute to subacute DVT throughout the left lower extremity from the common femoral vein into the calf veins."   Pressure Injury POA: Yes/No/NA Measurement: Wound bed: see photo Drainage (amount, consistency, odor)  Periwound: Dressing procedure/placement/frequency: Apply iodine from the swabsticks or swab pads from clean utility to  the left heel blister.  Allow to air dry. Use Prevalon heel lift boots bilaterally. WOC nurse will not follow at this time.  Please re-consult the WOC team if needed.  Helmut Muster, RN, MSN, CWOCN, CNS-BC, pager [phone]

## 2020-12-13 NOTE — Progress Notes (Signed)
ANTICOAGULATION CONSULT NOTE  Pharmacy Consult for heparin infusion Indication: DVT  No Known Allergies  Patient Measurements: Height: 5\' 6"  (167.6 cm) Weight: 68 kg (150 lb) IBW/kg (Calculated) : 59.3  Vital Signs: Temp: 98.2 F (36.8 C) (12/03 2112) Temp Source: Oral (12/03 1527) BP: 113/60 (12/03 2112) Pulse Rate: 75 (12/03 2112)  Labs: Recent Labs    12/12/20 1238 12/12/20 1452 12/13/20 0140  HGB 8.5*  --  7.1*  HCT 28.8*  --  23.6*  PLT 330  --  287  APTT  --  39*  --   LABPROT  --  14.9  --   INR  --  1.2  --   HEPARINUNFRC  --   --  0.43  CREATININE 0.91  --   --      Estimated Creatinine Clearance: 36.9 mL/min (by C-G formula based on SCr of 0.91 mg/dL).   Medical History: Past Medical History:  Diagnosis Date   Cellulitis 10/28/2016   bilateral lower extremities.     Assessment: 87 y.o. female with medical history significant for CHF, hypertension, chronic venous stasis of both lower extremities and lymphedema, stage III chronic kidney disease and vitamin B12 deficiency anemia who was brought into the ER for significant LLE edema. According to dispense history (Med Rec not yet complete) she is on no chronic anticoagulation rior to admission. Positive for extensive acute to subacute DVT throughout the left lower extremity from the common femoral vein into the calf veins  Goal of Therapy:  Heparin level 0.3-0.7 units/ml Monitor platelets by anticoagulation protocol: Yes  1204 0140 HL 0.43, therapeutic   Plan:  Continue heparin infusion at 1100 units/hr Will recheck HL in 8 hr to confirm CBC daily while on heparin.  99, PharmD, MBA 12/13/2020 2:25 AM

## 2020-12-13 NOTE — Progress Notes (Signed)
ANTICOAGULATION CONSULT NOTE  Pharmacy Consult for heparin infusion Indication: DVT  No Known Allergies  Patient Measurements: Height: 5\' 6"  (167.6 cm) Weight: 84.7 kg (186 lb 11.7 oz) IBW/kg (Calculated) : 59.3  Vital Signs: Temp: 98.5 F (36.9 C) (12/04 1554) BP: 107/94 (12/04 2025) Pulse Rate: 69 (12/04 2025)  Labs: Recent Labs    12/12/20 1238 12/12/20 1452 12/13/20 0140 12/13/20 1043 12/13/20 1801  HGB 8.5*  --  7.1*  --   --   HCT 28.8*  --  23.6*  --   --   PLT 330  --  287  --   --   APTT  --  39*  --   --   --   LABPROT  --  14.9  --   --   --   INR  --  1.2  --   --   --   HEPARINUNFRC  --   --  0.43 0.68 0.40  CREATININE 0.91  --   --  0.82  --      Estimated Creatinine Clearance: 48 mL/min (by C-G formula based on SCr of 0.82 mg/dL).   Medical History: Past Medical History:  Diagnosis Date   Cellulitis 10/28/2016   bilateral lower extremities.     Assessment: 87 y.o. female with medical history significant for CHF, hypertension, chronic venous stasis of both lower extremities and lymphedema, stage III chronic kidney disease and vitamin B12 deficiency anemia who was brought into the ER for significant LLE edema. According to dispense history (Med Rec not yet complete) she is on no chronic anticoagulation rior to admission. Positive for extensive acute to subacute DVT throughout the left lower extremity from the common femoral vein into the calf veins  Goal of Therapy:  Heparin level 0.3-0.7 units/ml Monitor platelets by anticoagulation protocol: Yes  Date/time Level  Comment 1204 0140  HL 0.43  Therapeutic 99 HL 0.40 Therapeutic x2 @ 1100 units/hr   Plan:  Heparin level therapeutic x 2, will continue current rate and repeat level in AM Monitor CBC daily while on heparin therapy.   Isaah Furry Rodriguez-Guzman PharmD, BCPS 12/13/2020 8:35 PM

## 2020-12-13 NOTE — Progress Notes (Signed)
ANTICOAGULATION CONSULT NOTE  Pharmacy Consult for heparin infusion Indication: DVT  No Known Allergies  Patient Measurements: Height: 5\' 6"  (167.6 cm) Weight: 84.7 kg (186 lb 11.7 oz) IBW/kg (Calculated) : 59.3  Vital Signs: Temp: 98.2 F (36.8 C) (12/04 0734) BP: 130/58 (12/04 0734) Pulse Rate: 67 (12/04 0734)  Labs: Recent Labs    12/12/20 1238 12/12/20 1452 12/13/20 0140 12/13/20 1043  HGB 8.5*  --  7.1*  --   HCT 28.8*  --  23.6*  --   PLT 330  --  287  --   APTT  --  39*  --   --   LABPROT  --  14.9  --   --   INR  --  1.2  --   --   HEPARINUNFRC  --   --  0.43 0.68  CREATININE 0.91  --   --  0.82     Estimated Creatinine Clearance: 48 mL/min (by C-G formula based on SCr of 0.82 mg/dL).   Medical History: Past Medical History:  Diagnosis Date   Cellulitis 10/28/2016   bilateral lower extremities.     Assessment: 87 y.o. female with medical history significant for CHF, hypertension, chronic venous stasis of both lower extremities and lymphedema, stage III chronic kidney disease and vitamin B12 deficiency anemia who was brought into the ER for significant LLE edema. According to dispense history (Med Rec not yet complete) she is on no chronic anticoagulation rior to admission. Positive for extensive acute to subacute DVT throughout the left lower extremity from the common femoral vein into the calf veins  Goal of Therapy:  Heparin level 0.3-0.7 units/ml Monitor platelets by anticoagulation protocol: Yes  1204 0140 HL 0.43 therapeutic 1204 1043 HL 0.68 thera   Plan:  1204 1043 HL 0.68  therapeutic but towards upper end and Hgb 8.5>>7.1 Continue heparin infusion at 1100 units/hr Will recheck HL in 8 hr to confirm CBC daily while on heparin.  99 PharmD Clinical Pharmacist 12/13/2020

## 2020-12-13 NOTE — H&P (View-Only) (Signed)
Subjective/Chief Complaint: Patient feeling better, continues to have swelling of the left lower limb.Marland Kitchen Has been elevating leg on a pillow.    Objective: Vital signs in last 24 hours: Temp:  [97.6 F (36.4 C)-98.2 F (36.8 C)] 98.2 F (36.8 C) (12/04 0734) Pulse Rate:  [67-78] 67 (12/04 0734) Resp:  [16-18] 16 (12/04 0734) BP: (113-154)/(58-86) 130/58 (12/04 0734) SpO2:  [97 %-100 %] 99 % (12/04 0734) Weight:  [68 kg-84.7 kg] 84.7 kg (12/04 0500) Last BM Date: 12/12/20  Intake/Output from previous day: 12/03 0701 - 12/04 0700 In: 289.4 [P.O.:240; I.V.:49.4] Out: -  Intake/Output this shift: Total I/O In: 120 [P.O.:120] Out: 400 [Urine:400]  General appearance: alert, cooperative, appears stated age, and no distress Resp: clear to auscultation bilaterally Cardio: regular rate and rhythm, S1, S2 normal, no murmur, click, rub or gallop Extremities: Significant swelling is noted in the left lower extremity compared to the right lower extremity.  Patient has a pressure blister of the left heel.  Skin still intact.  The patient's left leg is elevated with a pillow.  Lab Results:  Recent Labs    12/12/20 1238 12/13/20 0140  WBC 4.5 4.6  HGB 8.5* 7.1*  HCT 28.8* 23.6*  PLT 330 287   BMET Recent Labs    12/12/20 1238 12/13/20 1043  NA 139 139  K 4.4 4.0  CL 109 109  CO2 22 26  GLUCOSE 76 95  BUN 25* 19  CREATININE 0.91 0.82  CALCIUM 8.3* 8.4*   PT/INR Recent Labs    12/12/20 1452  LABPROT 14.9  INR 1.2   ABG No results for input(s): PHART, HCO3 in the last 72 hours.  Invalid input(s): PCO2, PO2  Studies/Results: DG Ankle Complete Left  Result Date: 12/12/2020 CLINICAL DATA:  A 87 year old female presents for evaluation of cellulitis in the lower leg. Reportedly with significant swelling in the LEFT lower leg. EXAM: LEFT ANKLE COMPLETE - 3+ VIEW COMPARISON:  Tibia and fibula evaluation from 2020. FINDINGS: Degenerative changes are noted about the  ankle. Plantar and Achilles enthesopathic changes are noted. Diffuse soft tissue swelling about the visualized LEFT lower extremity. No acute fracture or bony abnormality. IMPRESSION: Degenerative changes about the ankle. Diffuse soft tissue swelling about the visualized LEFT lower extremity. No acute bony abnormality. Electronically Signed   By: Donzetta Kohut M.D.   On: 12/12/2020 13:23   US Venous Img Lower Unilateral Left  Result Date: 12/12/2020 CLINICAL DATA:  Left lower extremity edema EXAM: LEFT LOWER EXTREMITY VENOUS DOPPLER ULTRASOUND TECHNIQUE: Gray-scale sonography with graded compression, as well as color Doppler and duplex ultrasound were performed to evaluate the lower extremity deep venous systems from the level of the common femoral vein and including the common femoral, femoral, profunda femoral, popliteal and calf veins including the posterior tibial, peroneal and gastrocnemius veins when visible. The superficial great saphenous vein was also interrogated. Spectral Doppler was utilized to evaluate flow at rest and with distal augmentation maneuvers in the common femoral, femoral and popliteal veins. COMPARISON:  None. FINDINGS: Contralateral Common Femoral Vein: Respiratory phasicity is normal and symmetric with the symptomatic side. No evidence of thrombus. Normal compressibility. Common Femoral Vein: Noncompressible. The lumen is expanded and filled with low-level echogenic material. Scattered channels of flow are present on color Doppler imaging. Findings are consistent with acute to subacute thrombus. Saphenofemoral Junction: Thrombus extends just into the origin of the saphenofemoral junction. Profunda Femoral Vein: Thrombus extends into the profunda femoral vein. Femoral Vein: Thrombus extends  throughout the femoral vein in the thigh with areas of occlusive thrombus in the mid and distal thigh. Popliteal Vein: Thrombus extends through the popliteal vein. Calf Veins: Thrombus extends into  the calf veins. Superficial Great Saphenous Vein: No evidence of thrombus. Normal compressibility. Venous Reflux:  None. Other Findings:  None. IMPRESSION: Positive for extensive acute to subacute DVT throughout the left lower extremity from the common femoral vein into the calf veins. Electronically Signed   By: Malachy Moan M.D.   On: 12/12/2020 13:41    Anti-infectives: Anti-infectives (From admission, onward)    Start     Dose/Rate Route Frequency Ordered Stop   12/12/20 1400  doxycycline (VIBRA-TABS) tablet 100 mg  Status:  Discontinued        100 mg Oral  Once 12/12/20 1352 12/12/20 1443       Assessment/Plan: s/p * No surgery found * This is a 87 year old lady with a left lower extremity extensive deep venous thrombosis involving the common femoral vein into the calf veins.Continue with heparin drip , keep n.p.o. after midnight for possible mechanical thrombolysis versus chemical thrombolysis of the left lower extremity.  Louisa Second 12/13/2020

## 2020-12-13 NOTE — Consult Note (Signed)
  Subjective:  Patient ID: [name] [name], female    DOB: [birth year],  MRN: [id number]  A 87 y.o. female with pastmedical history significant of dCHF, chronic venous insufficiency change and lymphedema in both legs, CKD stage IIIb, dementia, leg cellulitis, UTI, hypertension, anemia due to vitamin B-12 deficiency who presents with worsening leg edema found to have a DVT of the left lower extremity with blister formation of the left heel.  Podiatry was consulted to rule out infectious related to a blister any infection present I will blister as well as drain the blister.  Patient is resting comfortably on bed.  No nausea fever chills vomiting.  Mild pain on palpation to the left leg.  No acute complaints.  Objective:   Vitals:   12/13/20 0442 12/13/20 0734  BP: 113/86 (!) 130/58  Pulse: 71 67  Resp: 16 16  Temp: 98.2 F (36.8 C) 98.2 F (36.8 C)  SpO2: 100% 99%   General AA&O x3. Normal mood and affect.  Vascular Dorsalis pedis and posterior tibial pulses 2/4 bilat. Brisk capillary refill to all digits. Pedal hair present.  Neurologic Epicritic sensation grossly intact.  Dermatologic Left heel blister noted.  No clinical signs of infection noted.  Serous fluid was drained status post bedside drainage of the blister.  No malodor present no purulent drainage noted.  Left leg edema noted likely due to underlying DVT.   Orthopedic: MMT 5/5 in dorsiflexion, plantarflexion, inversion, and eversion. Normal joint ROM without pain or crepitus.     Assessment & Plan:  Patient was evaluated and treated and all questions answered.  Left posterior heel blister secondary to underlying friction versus edema to the left leg -All questions and concerns were discussed with the patient in extensive detail. -The blister was lanced at bedside to show serous fluid.  No signs of purulent drainage noted no clinical signs of infection noted. -Xeroform was applied to the blister. Mepilex dressing  -Patient  is okay to be discharged from podiatric standpoint. -No local wound care needed. -She can follow-up with [place] in an outpatient setting 2 weeks from discharge or as needed. -Weightbearing as tolerated in surgical shoe  Candelaria Stagers, DPM  Accessible via secure chat for questions or concerns.

## 2020-12-13 NOTE — Progress Notes (Signed)
PROGRESS NOTE    [name] [name]  [id number] DOB: May 24, 1926 DOA: 12/12/2020 PCP: Leonel Ramsay, MD   Assessment & Plan:   Principal Problem:   DVT (deep venous thrombosis) ([facility]) Active Problems:   Anemia due to vitamin B12 deficiency   Stage 3b chronic kidney disease (HCC)   Chronic diastolic CHF (congestive heart failure) (HCC)   HTN (hypertension)   Blister of left foot   DVT: LE doppler is positive for extensive acute to subacute DVT throughout the left lower extremity from the common femoral vein into the calf veins. Continue on IV heparin. Tylenol, percocet prn for pain. Provoked DVT as pt has not walked in 3 years as per pt's son. Hx of chronic venous insufficieny & lymphedema in both legs. Vasc surg following and recs apprec   Anemia due to vitamin B12 deficiency: started on multivitamin. B12 level is pending. Will transfuse if Hb < 7.0   CKDIIIb: Cr is stable. Avoid nephrotoxic meds    Chronic diastolic CHF: echo on [id number] showed EF of 60 to 65% with grade 2 diastolic dysfunction.  CHF seem to be compensated. Monitor I/Os   Blister of left foot: present on admission. Does not seem to be infected. Recs as per podiatry.  Dementia: continue w/ supportive care    DVT prophylaxis: IV heparin drp  Code Status: DNR Family Communication:  Disposition Plan: depends on PT/OT recs (not consulted yet)   Level of care: Telemetry Medical  Status is: Observation  The patient remains OBS appropriate and will d/c before 2 midnights.       Consultants:  Vascular surg Podiatry   Procedures:  Antimicrobials:   Subjective: Pt c/o fatigue   Objective: Vitals:   12/12/20 2112 12/13/20 0442 12/13/20 0500 12/13/20 0734  BP: 113/60 113/86  (!) 130/58  Pulse: 75 71  67  Resp: 16 16  16   Temp: 98.2 F (36.8 C) 98.2 F (36.8 C)  98.2 F (36.8 C)  TempSrc:      SpO2: 100% 100%  99%  Weight:   84.7 kg   Height:        Intake/Output Summary (Last 24  hours) at 12/13/2020 0743 Last data filed at 12/12/2020 1842 Gross per 24 hour  Intake 289.39 ml  Output --  Net 289.39 ml   Filed Weights   12/12/20 1244 12/13/20 0500  Weight: 68 kg 84.7 kg    Examination:  General exam: Appears calm and comfortable  Respiratory system: Clear to auscultation. Respiratory effort normal. Cardiovascular system: S1 & S2 +. No rubs, gallops or clicks. Gastrointestinal system: Abdomen is nondistended, soft and nontender. Normal bowel sounds heard. Central nervous system: Alert and oriented to person only. Moves all extremities  Psychiatry: Judgement and insight appear poor. Flat mood and affect    Data Reviewed: I have personally reviewed following labs and imaging studies  CBC: Recent Labs  Lab 12/12/20 1238 12/13/20 0140  WBC 4.5 4.6  NEUTROABS 2.3  --   HGB 8.5* 7.1*  HCT 28.8* 23.6*  MCV 98.6 95.9  PLT 330 [id number]   Basic Metabolic Panel: Recent Labs  Lab 12/12/20 1238  NA 139  K 4.4  CL 109  CO2 22  GLUCOSE 76  BUN 25*  CREATININE 0.91  CALCIUM 8.3*   GFR: Estimated Creatinine Clearance: 43.3 mL/min (by C-G formula based on SCr of 0.91 mg/dL). Liver Function Tests: No results for input(s): AST, ALT, ALKPHOS, BILITOT, PROT, ALBUMIN in the last 168 hours. No results for  input(s): LIPASE, AMYLASE in the last 168 hours. No results for input(s): AMMONIA in the last 168 hours. Coagulation Profile: Recent Labs  Lab 12/12/20 1452  INR 1.2   Cardiac Enzymes: No results for input(s): CKTOTAL, CKMB, CKMBINDEX, TROPONINI in the last 168 hours. BNP (last 3 results) No results for input(s): PROBNP in the last 8760 hours. HbA1C: No results for input(s): HGBA1C in the last 72 hours. CBG: No results for input(s): GLUCAP in the last 168 hours. Lipid Profile: No results for input(s): CHOL, HDL, LDLCALC, TRIG, CHOLHDL, LDLDIRECT in the last 72 hours. Thyroid Function Tests: No results for input(s): TSH, T4TOTAL, FREET4, T3FREE,  THYROIDAB in the last 72 hours. Anemia Panel: No results for input(s): VITAMINB12, FOLATE, FERRITIN, TIBC, IRON, RETICCTPCT in the last 72 hours. Sepsis Labs: No results for input(s): PROCALCITON, LATICACIDVEN in the last 168 hours.  Recent Results (from the past 240 hour(s))  Resp Panel by RT-PCR (Flu A&B, Covid) Nasopharyngeal Swab     Status: None   Collection Time: 12/12/20  2:52 PM   Specimen: Nasopharyngeal Swab; Nasopharyngeal(NP) swabs in vial transport medium  Result Value Ref Range Status   SARS Coronavirus 2 by RT PCR NEGATIVE NEGATIVE Final    Comment: (NOTE) SARS-CoV-2 target nucleic acids are NOT DETECTED.  The SARS-CoV-2 RNA is generally detectable in upper respiratory specimens during the acute phase of infection. The lowest concentration of SARS-CoV-2 viral copies this assay can detect is 138 copies/mL. A negative result does not preclude SARS-Cov-2 infection and should not be used as the sole basis for treatment or other patient management decisions. A negative result may occur with  improper specimen collection/handling, submission of specimen other than nasopharyngeal swab, presence of viral mutation(s) within the areas targeted by this assay, and inadequate number of viral copies(<138 copies/mL). A negative result must be combined with clinical observations, patient history, and epidemiological information. The expected result is Negative.  Fact Sheet for Patients:  BloggerCourse.comhttps://www.fda.gov/media/152166/download  Fact Sheet for Healthcare Providers:  SeriousBroker.ithttps://www.fda.gov/media/152162/download  This test is no t yet approved or cleared by the [place] FDA and  has been authorized for detection and/or diagnosis of SARS-CoV-2 by FDA under an Emergency Use Authorization (EUA). This EUA will remain  in effect (meaning this test can be used) for the duration of the COVID-19 declaration under Section 564(b)(1) of the Act, 21 U.S.C.section 360bbb-3(b)(1), unless the  authorization is terminated  or revoked sooner.       Influenza A by PCR NEGATIVE NEGATIVE Final   Influenza B by PCR NEGATIVE NEGATIVE Final    Comment: (NOTE) The Xpert Xpress SARS-CoV-2/FLU/RSV plus assay is intended as an aid in the diagnosis of influenza from Nasopharyngeal swab specimens and should not be used as a sole basis for treatment. Nasal washings and aspirates are unacceptable for Xpert Xpress SARS-CoV-2/FLU/RSV testing.  Fact Sheet for Patients: BloggerCourse.comhttps://www.fda.gov/media/152166/download  Fact Sheet for Healthcare Providers: SeriousBroker.ithttps://www.fda.gov/media/152162/download  This test is not yet approved or cleared by the [place] FDA and has been authorized for detection and/or diagnosis of SARS-CoV-2 by FDA under an Emergency Use Authorization (EUA). This EUA will remain in effect (meaning this test can be used) for the duration of the COVID-19 declaration under Section 564(b)(1) of the Act, 21 U.S.C. section 360bbb-3(b)(1), unless the authorization is terminated or revoked.  Performed at [facility], [address] [place], [place] [address]          Radiology Studies: DG Ankle Complete Left  Result Date: 12/12/2020 CLINICAL DATA:  A 87 year old female presents for evaluation of cellulitis in the lower leg. Reportedly with significant swelling in the LEFT lower leg. EXAM: LEFT ANKLE COMPLETE - 3+ VIEW COMPARISON:  Tibia and fibula evaluation from 2020. FINDINGS: Degenerative changes are noted about the ankle. Plantar and Achilles enthesopathic changes are noted. Diffuse soft tissue swelling about the visualized LEFT lower extremity. No acute fracture or bony abnormality. IMPRESSION: Degenerative changes about the ankle. Diffuse soft tissue swelling about the visualized LEFT lower extremity. No acute bony abnormality. Electronically Signed   By: Donzetta Kohut M.D.   On: 12/12/2020 13:23   US Venous Img Lower Unilateral Left  Result Date:  12/12/2020 CLINICAL DATA:  Left lower extremity edema EXAM: LEFT LOWER EXTREMITY VENOUS DOPPLER ULTRASOUND TECHNIQUE: Gray-scale sonography with graded compression, as well as color Doppler and duplex ultrasound were performed to evaluate the lower extremity deep venous systems from the level of the common femoral vein and including the common femoral, femoral, profunda femoral, popliteal and calf veins including the posterior tibial, peroneal and gastrocnemius veins when visible. The superficial great saphenous vein was also interrogated. Spectral Doppler was utilized to evaluate flow at rest and with distal augmentation maneuvers in the common femoral, femoral and popliteal veins. COMPARISON:  None. FINDINGS: Contralateral Common Femoral Vein: Respiratory phasicity is normal and symmetric with the symptomatic side. No evidence of thrombus. Normal compressibility. Common Femoral Vein: Noncompressible. The lumen is expanded and filled with low-level echogenic material. Scattered channels of flow are present on color Doppler imaging. Findings are consistent with acute to subacute thrombus. Saphenofemoral Junction: Thrombus extends just into the origin of the saphenofemoral junction. Profunda Femoral Vein: Thrombus extends into the profunda femoral vein. Femoral Vein: Thrombus extends throughout the femoral vein in the thigh with areas of occlusive thrombus in the mid and distal thigh. Popliteal Vein: Thrombus extends through the popliteal vein. Calf Veins: Thrombus extends into the calf veins. Superficial Great Saphenous Vein: No evidence of thrombus. Normal compressibility. Venous Reflux:  None. Other Findings:  None. IMPRESSION: Positive for extensive acute to subacute DVT throughout the left lower extremity from the common femoral vein into the calf veins. Electronically Signed   By: Malachy Moan M.D.   On: 12/12/2020 13:41        Scheduled Meds: Continuous Infusions:  heparin 1,100 Units/hr (12/12/20  1505)     LOS: 1 day    Time spent: 30 mins     Charise Killian, MD [facility] Pager [phone]  If 7PM-7AM, please contact night-coverage 12/13/2020, 7:43 AM

## 2020-12-14 ENCOUNTER — Other Ambulatory Visit (INDEPENDENT_AMBULATORY_CARE_PROVIDER_SITE_OTHER): Payer: Self-pay | Admitting: Vascular Surgery

## 2020-12-14 ENCOUNTER — Encounter
Admission: EM | Disposition: A | Discharge status: Home Health | Payer: Medicare HMO | Source: Home / Self Care | Attending: Internal Medicine

## 2020-12-14 DIAGNOSIS — I82402 Acute embolism and thrombosis of unspecified deep veins of left lower extremity: Secondary | ICD-10-CM

## 2020-12-14 HISTORY — PX: LOWER EXTREMITY VENOGRAPHY: CATH118253

## 2020-12-14 LAB — BASIC METABOLIC PANEL
Anion gap: 5 (ref 5–15)
BUN: 17 mg/dL (ref 8–23)
CO2: 26 mmol/L (ref 22–32)
Calcium: 8.5 mg/dL — ABNORMAL LOW (ref 8.9–10.3)
Chloride: 110 mmol/L (ref 98–111)
Creatinine, Ser: 0.74 mg/dL (ref 0.44–1.00)
GFR, Estimated: >60 mL/min (ref >60)
Glucose, Bld: 86 mg/dL (ref 70–99)
Potassium: 4.1 mmol/L (ref 3.5–5.1)
Sodium: 141 mmol/L (ref 135–145)

## 2020-12-14 LAB — CBC
HCT: 25.5 % — ABNORMAL LOW (ref 36.0–46.0)
Hemoglobin: 7.6 g/dL — ABNORMAL LOW (ref 12.0–15.0)
MCH: 28.5 pg (ref 26.0–34.0)
MCHC: 29.8 g/dL — ABNORMAL LOW (ref 30.0–36.0)
MCV: 95.5 fL (ref 80.0–100.0)
Platelets: 282 10*3/uL (ref 150–400)
RBC: 2.67 MIL/uL — ABNORMAL LOW (ref 3.87–5.11)
RDW: 15.1 % (ref 11.5–15.5)
WBC: 4.7 10*3/uL (ref 4.0–10.5)
nRBC: 0 % (ref 0.0–0.2)

## 2020-12-14 LAB — HEPARIN LEVEL (UNFRACTIONATED): Heparin Unfractionated: 0.52 IU/mL (ref 0.30–0.70)

## 2020-12-14 LAB — GLUCOSE, CAPILLARY: Glucose-Capillary: 81 mg/dL (ref 70–99)

## 2020-12-14 SURGERY — LOWER EXTREMITY VENOGRAPHY
Anesthesia: Moderate Sedation | Laterality: Left

## 2020-12-14 MED ORDER — DIPHENHYDRAMINE HCL 50 MG/ML IJ SOLN: 50.0000 mg | Freq: Once | INTRAMUSCULAR | Status: DC | PRN

## 2020-12-14 MED ORDER — FAMOTIDINE 20 MG PO TABS: 40.0000 mg | ORAL_TABLET | Freq: Once | ORAL | Status: DC | PRN

## 2020-12-14 MED ORDER — HALOPERIDOL LACTATE 5 MG/ML IJ SOLN
2.0000 mg | Freq: Once | INTRAMUSCULAR | Status: AC
Start: 2020-12-14 — End: 2020-12-14
  Administered 2020-12-14: 2 mg via INTRAVENOUS
  Filled 2020-12-14: qty 1

## 2020-12-14 MED ORDER — MIDAZOLAM HCL 2 MG/2ML IJ SOLN
INTRAMUSCULAR | Status: DC | PRN
  Administered 2020-12-14 (×4): 1 mg via INTRAVENOUS

## 2020-12-14 MED ORDER — FENTANYL CITRATE (PF) 100 MCG/2ML IJ SOLN
INTRAMUSCULAR | Status: DC | PRN
  Administered 2020-12-14: 25 ug via INTRAVENOUS
  Administered 2020-12-14: 50 ug via INTRAVENOUS
  Administered 2020-12-14: 25 ug via INTRAVENOUS

## 2020-12-14 MED ORDER — SODIUM CHLORIDE 0.9 % IV SOLN: INTRAVENOUS | Status: DC

## 2020-12-14 MED ORDER — ALTEPLASE 2 MG IJ SOLR
INTRAMUSCULAR | Status: AC
  Filled 2020-12-14: qty 4

## 2020-12-14 MED ORDER — MIDAZOLAM HCL 2 MG/2ML IJ SOLN
INTRAMUSCULAR | Status: AC
  Filled 2020-12-14: qty 4

## 2020-12-14 MED ORDER — MIDAZOLAM HCL 2 MG/ML PO SYRP: 8.0000 mg | ORAL_SOLUTION | Freq: Once | ORAL | Status: DC | PRN

## 2020-12-14 MED ORDER — HEPARIN SODIUM (PORCINE) 1000 UNIT/ML IJ SOLN
INTRAMUSCULAR | Status: AC
  Filled 2020-12-14: qty 10

## 2020-12-14 MED ORDER — ALTEPLASE 2 MG IJ SOLR
INTRAMUSCULAR | Status: DC | PRN
  Administered 2020-12-14: 4 mg

## 2020-12-14 MED ORDER — METHYLPREDNISOLONE SODIUM SUCC 125 MG IJ SOLR: 125.0000 mg | Freq: Once | INTRAMUSCULAR | Status: DC | PRN

## 2020-12-14 MED ORDER — HYDROMORPHONE HCL 1 MG/ML IJ SOLN: 1.0000 mg | Freq: Once | INTRAMUSCULAR | Status: DC | PRN

## 2020-12-14 MED ORDER — FENTANYL CITRATE PF 50 MCG/ML IJ SOSY
PREFILLED_SYRINGE | INTRAMUSCULAR | Status: AC
  Filled 2020-12-14: qty 2

## 2020-12-14 MED ORDER — ONDANSETRON HCL 4 MG/2ML IJ SOLN: 4.0000 mg | Freq: Four times a day (QID) | INTRAMUSCULAR | Status: DC | PRN

## 2020-12-14 MED ORDER — CEFAZOLIN SODIUM-DEXTROSE 1-4 GM/50ML-% IV SOLN
1.0000 g | Freq: Once | INTRAVENOUS | Status: AC
  Administered 2020-12-14: 1 g via INTRAVENOUS

## 2020-12-14 SURGICAL SUPPLY — 16 items
BALLN DORADO 10X80X80 (BALLOONS) ×2
BALLN ULTRVRSE 8X200X130 (BALLOONS) ×2
BALLOON DORADO 10X80X80 (BALLOONS) ×1 IMPLANT
BALLOON ULTRVRSE 8X200X130 (BALLOONS) ×1 IMPLANT
CANISTER PENUMBRA ENGINE (MISCELLANEOUS) ×2 IMPLANT
CANNULA 5F STIFF (CANNULA) ×2 IMPLANT
CATH BEACON 5 .035 65 KMP TIP (CATHETERS) ×2 IMPLANT
CATH INDIGO 12XTORQ 100 (CATHETERS) ×2 IMPLANT
COVER PROBE U/S 5X48 (MISCELLANEOUS) ×2 IMPLANT
GLIDEWIRE ADV .035X180CM (WIRE) ×2 IMPLANT
KIT ENCORE 26 ADVANTAGE (KITS) ×2 IMPLANT
PACK ANGIOGRAPHY (CUSTOM PROCEDURE TRAY) ×2 IMPLANT
SET INTRO CAPELLA COAXIAL (SET/KITS/TRAYS/PACK) ×2 IMPLANT
SHEATH PINNACLE 11FRX10 (SHEATH) ×2 IMPLANT
SUT PROLENE 0 CT 1 30 (SUTURE) ×2 IMPLANT
WIRE GUIDERIGHT .035X150 (WIRE) ×2 IMPLANT

## 2020-12-14 NOTE — Plan of Care (Signed)

## 2020-12-14 NOTE — Progress Notes (Signed)
Patient wound care provided and dressing changed. Pravalon boots placed on patient and feet elevated on pillows. Patient continues to be confused, argumentative, combative, agitated, and noncompliant. Mitts in place. NPO since MD due to possibility of procedure.

## 2020-12-14 NOTE — Progress Notes (Signed)
ANTICOAGULATION CONSULT NOTE  Pharmacy Consult for heparin infusion Indication: DVT  No Known Allergies  Patient Measurements: Height: 5\' 6"  (167.6 cm) Weight: 86.5 kg (190 lb 11.2 oz) IBW/kg (Calculated) : 59.3  Vital Signs: Temp: 98 F (36.7 C) (12/05 0456) Temp Source: Axillary (12/04 2334) BP: 132/72 (12/05 0456) Pulse Rate: 65 (12/05 0456)  Labs: Recent Labs    12/12/20 1238 12/12/20 1238 12/12/20 1452 12/13/20 0140 12/13/20 1043 12/13/20 1801 12/14/20 0511  HGB 8.5*  --   --  7.1*  --   --  7.6*  HCT 28.8*  --   --  23.6*  --   --  25.5*  PLT 330  --   --  287  --   --  282  APTT  --   --  39*  --   --   --   --   LABPROT  --   --  14.9  --   --   --   --   INR  --   --  1.2  --   --   --   --   HEPARINUNFRC  --    < >  --  0.43 0.68 0.40 0.52  CREATININE 0.91  --   --   --  0.82  --  0.74   < > = values in this interval not displayed.     Estimated Creatinine Clearance: 49.7 mL/min (by C-G formula based on SCr of 0.74 mg/dL).   Medical History: Past Medical History:  Diagnosis Date   Cellulitis 10/28/2016   bilateral lower extremities.     Assessment: 87 y.o. female with medical history significant for CHF, hypertension, chronic venous stasis of both lower extremities and lymphedema, stage III chronic kidney disease and vitamin B12 deficiency anemia who was brought into the ER for significant LLE edema. According to dispense history (Med Rec not yet complete) she is on no chronic anticoagulation rior to admission. Positive for extensive acute to subacute DVT throughout the left lower extremity from the common femoral vein into the calf veins  Goal of Therapy:  Heparin level 0.3-0.7 units/ml Monitor platelets by anticoagulation protocol: Yes  Date/time Level  Comment 1204 0140  HL 0.43 Therapeutic 1204 1836 HL 0.40 Therapeutic x2 @ 1100 units/hr 1205 0511 HL 0.52 Therapeutic   Plan:  --Heparin level therapeutic --Continue heparin at 1100  units/hr --CBC and HL daily while on heparin  99, PharmD, BCPS Clinical Pharmacist 12/14/2020 7:14 AM

## 2020-12-14 NOTE — Op Note (Signed)
[facility]   OPERATIVE NOTE   PRE-OPERATIVE DIAGNOSIS: extensive left lower extremity DVT  POST-OPERATIVE DIAGNOSIS: same   PROCEDURE: 1.   US guidance for vascular access to left popliteal vein 2.   Catheter placement into left common iliac vein from left popliteal approach 3.   IVC gram and left lower extremity venogram 4.   Catheter directed thrombolysis with 4 mg of tPA to the left superficial femoral and femoral veins  5.   Mechanical thrombectomy to left popliteal vein, superficial femoral vein, common femoral vein, and distal external iliac vein with the penumbra CAT 12 device 6.   PTA of superficial femoral vein and common femoral vein with 8 mm balloon as well as percutaneous transluminal angioplasty of the common femoral vein with a 10 mm balloon    SURGEON: Leotis Pain, MD  ASSISTANT(S): none  ANESTHESIA: local with moderate conscious sedation for 51 minutes using 4 mg of Versed and 100 mcg of Fentanyl  ESTIMATED BLOOD LOSS: 250 cc  FINDING(S): 1.  Extensive thrombus up from the popliteal vein, superficial femoral vein, through the common femoral vein and into the external iliac vein.  The common iliac vein and IVC were widely patent after selective catheter injection in the common iliac vein with a Kumpe catheter.  SPECIMEN(S):  none  INDICATIONS:    Patient is a 87 y.o. female who presents with extensive left lower extremity.  Patient has marked leg swelling and pain.  Venous intervention is performed to reduce the symtpoms and avoid long term postphlebitic symptoms.    DESCRIPTION: After obtaining full informed written consent, the patient was brought back to the vascular suite and placed supine upon the table. Moderate conscious sedation was administered during a face to face encounter with the patient throughout the procedure with my supervision of the RN administering medicines and monitoring the patient's vital signs, pulse oximetry,  telemetry and mental status throughout from the start of the procedure until the patient was taken to the recovery room.  After obtaining adequate anesthesia, the patient was prepped and draped in the standard fashion.    The patient was then placed into the prone position.  The left popliteal vein was then accessed under direct ultrasound guidance without difficulty with a micropuncture needle and a permanent image was recorded.  I then upsized to an 11Fr sheath over a J wire.  Imaging showed extensive DVT with minimal flow.  A Kumpe catheter and Terumo advantage wire were then advanced into the CFV and and then the common iliac vein and images were performed.  There was thrombus from the popliteal vein up through the superficial femoral and common femoral veins into the external iliac vein but the common iliac vein and IVC were patent.  I then used the Kumpe catheter and instilled 4 mg of tpa throughout the professional femoral and common femoral veins.  After this dwelled, I used the Penumbra Cat 12 catheter and evacuated about 150 cc of effluent with mechanical thrombectomy throughout the external iliac veins, CFV, SFV, and down into the popliteal vein.  This had marked improvement but the mid and proximal superficial femoral vein and common femoral veins were quite narrowed likely indicating a more chronic process.  I then treated the SFV and CFV with 2 inflations with a 20 cm long 8 mm diameter angioplasty balloon to open a channel.  This resulted in some resolution of the stenosis in the superficial femoral vein but there remained narrowing in the  common femoral vein.  I then upsized to a 10 mm diameter by 8 cm length angioplasty balloon and inflated this in the common femoral vein up to 10 atm for 1 minute.  Completion imaging showed an excellent channel flow with mild residual stenosis in the superficial femoral and common femoral veins in the 30 to 40% range that was nonflow limiting.  There was minimal  residual thrombus.  I then elected to terminate the procedure.  The sheath was removed and a dressing was placed.  She was taken to the recovery room in stable condition having tolerated the procedure well.    COMPLICATIONS: None  CONDITION: Stable  Leotis Pain 12/14/2020 3:49 PM

## 2020-12-14 NOTE — Interval H&P Note (Signed)
History and Physical Interval Note:  12/14/2020 2:45 PM  [name] [name]  has presented today for surgery, with the diagnosis of Extensive DVT of the left lower extremity.  The various methods of treatment have been discussed with the patient and family. After consideration of risks, benefits and other options for treatment, the patient has consented to  Procedure(s): LOWER EXTREMITY VENOGRAPHY (Left) as a surgical intervention.  The patient's history has been reviewed, patient examined, no change in status, stable for surgery.  I have reviewed the patient's chart and labs.  Questions were answered to the patient's satisfaction.     Festus Barren

## 2020-12-14 NOTE — Progress Notes (Signed)
PROGRESS NOTE    [name] [name]  [id number] DOB: [birth year] DOA: 12/12/2020 PCP: Leonel Ramsay, MD   Assessment & Plan:   Principal Problem:   DVT (deep venous thrombosis) ([facility]) Active Problems:   Anemia due to vitamin B12 deficiency   Stage 3b chronic kidney disease (HCC)   Chronic diastolic CHF (congestive heart failure) (HCC)   HTN (hypertension)   Blister of left foot   DVT: LE doppler is positive for extensive acute to subacute DVT throughout the left lower extremity from the common femoral vein into the calf veins. Continue on IV heparin. Possible mechanical vs chemical thrombolysis today as per vascular surg. Tylenol, percocet prn for pain. Provoked DVT as pt has not walked in 3 years as per pt's son. Hx of chronic venous insufficieny & lymphedema in both legs. Vasc surg following and recs apprec   Anemia due to vitamin B12 deficiency: continue on multivitamin. B12 is 452, improved. Will transfuse if Hb <7.0  CKDIIIb: Cr is stable and better than baseline.     Chronic diastolic CHF: echo on [id number] showed EF of 60 to 65% with grade 2 diastolic dysfunction.  CHF seem to be compensated. Monitor I/Os   Blister of left foot: POA. Does not seem to be infected. Recs as per podiatry   Dementia: continue w/ supportive care    DVT prophylaxis: IV heparin drp  Code Status: DNR Family Communication:  Disposition Plan: likely d/c back home. Pt has been bedbound for about 3 years   Level of care: Telemetry Medical  Status is: Inpatient  Remains inpatient appropriate because: severity of illness, possible thrombolysis today as per vascular surg        Consultants:  Vascular surg Podiatry   Procedures:  Antimicrobials:   Subjective: Pt c/o malaise   Objective: Vitals:   12/13/20 2025 12/13/20 2334 12/14/20 0456 12/14/20 0500  BP: (!) 107/94 131/74 132/72   Pulse: 69 75 65   Resp: 18 15 19    Temp:  97.7 F (36.5 C) 98 F (36.7 C)   TempSrc:   Axillary    SpO2: 99% 98% 100%   Weight:    86.5 kg  Height:        Intake/Output Summary (Last 24 hours) at 12/14/2020 0745 Last data filed at 12/14/2020 0320 Gross per 24 hour  Intake 120 ml  Output 800 ml  Net -680 ml   Filed Weights   12/12/20 1244 12/13/20 0500 12/14/20 0500  Weight: 68 kg 84.7 kg 86.5 kg    Examination:  General exam: Appears comfortable  Respiratory system: clear breath sounds b/l. Cardiovascular system: S1/S2+. No gallops or rubs. Gastrointestinal system: Abd is soft, NT, ND & hypoactive bowel sounds  Central nervous system: alert and oriented to person only. Moves all extremities  Psychiatry: Judgement and insight appears poor. Flat mood and affect     Data Reviewed: I have personally reviewed following labs and imaging studies  CBC: Recent Labs  Lab 12/12/20 1238 12/13/20 0140 12/14/20 0511  WBC 4.5 4.6 4.7  NEUTROABS 2.3  --   --   HGB 8.5* 7.1* 7.6*  HCT 28.8* 23.6* 25.5*  MCV 98.6 95.9 95.5  PLT 330 287 [id number]   Basic Metabolic Panel: Recent Labs  Lab 12/12/20 1238 12/13/20 1043 12/14/20 0511  NA 139 139 141  K 4.4 4.0 4.1  CL 109 109 110  CO2 22 26 26   GLUCOSE 76 95 86  BUN 25* 19 17  CREATININE 0.91 0.82 0.74  CALCIUM 8.3* 8.4* 8.5*   GFR: Estimated Creatinine Clearance: 49.7 mL/min (by C-G formula based on SCr of 0.74 mg/dL). Liver Function Tests: No results for input(s): AST, ALT, ALKPHOS, BILITOT, PROT, ALBUMIN in the last 168 hours. No results for input(s): LIPASE, AMYLASE in the last 168 hours. No results for input(s): AMMONIA in the last 168 hours. Coagulation Profile: Recent Labs  Lab 12/12/20 1452  INR 1.2   Cardiac Enzymes: No results for input(s): CKTOTAL, CKMB, CKMBINDEX, TROPONINI in the last 168 hours. BNP (last 3 results) No results for input(s): PROBNP in the last 8760 hours. HbA1C: No results for input(s): HGBA1C in the last 72 hours. CBG: Recent Labs  Lab 12/13/20 0801  GLUCAP 86   Lipid  Profile: No results for input(s): CHOL, HDL, LDLCALC, TRIG, CHOLHDL, LDLDIRECT in the last 72 hours. Thyroid Function Tests: No results for input(s): TSH, T4TOTAL, FREET4, T3FREE, THYROIDAB in the last 72 hours. Anemia Panel: Recent Labs    12/13/20 1043  VITAMINB12 452   Sepsis Labs: No results for input(s): PROCALCITON, LATICACIDVEN in the last 168 hours.  Recent Results (from the past 240 hour(s))  Resp Panel by RT-PCR (Flu A&B, Covid) Nasopharyngeal Swab     Status: None   Collection Time: 12/12/20  2:52 PM   Specimen: Nasopharyngeal Swab; Nasopharyngeal(NP) swabs in vial transport medium  Result Value Ref Range Status   SARS Coronavirus 2 by RT PCR NEGATIVE NEGATIVE Final    Comment: (NOTE) SARS-CoV-2 target nucleic acids are NOT DETECTED.  The SARS-CoV-2 RNA is generally detectable in upper respiratory specimens during the acute phase of infection. The lowest concentration of SARS-CoV-2 viral copies this assay can detect is 138 copies/mL. A negative result does not preclude SARS-Cov-2 infection and should not be used as the sole basis for treatment or other patient management decisions. A negative result may occur with  improper specimen collection/handling, submission of specimen other than nasopharyngeal swab, presence of viral mutation(s) within the areas targeted by this assay, and inadequate number of viral copies(<138 copies/mL). A negative result must be combined with clinical observations, patient history, and epidemiological information. The expected result is Negative.  Fact Sheet for Patients:  BloggerCourse.com  Fact Sheet for Healthcare Providers:  SeriousBroker.it  This test is no t yet approved or cleared by the [place] FDA and  has been authorized for detection and/or diagnosis of SARS-CoV-2 by FDA under an Emergency Use Authorization (EUA). This EUA will remain  in effect (meaning this test can  be used) for the duration of the COVID-19 declaration under Section 564(b)(1) of the Act, 21 U.S.C.section 360bbb-3(b)(1), unless the authorization is terminated  or revoked sooner.       Influenza A by PCR NEGATIVE NEGATIVE Final   Influenza B by PCR NEGATIVE NEGATIVE Final    Comment: (NOTE) The Xpert Xpress SARS-CoV-2/FLU/RSV plus assay is intended as an aid in the diagnosis of influenza from Nasopharyngeal swab specimens and should not be used as a sole basis for treatment. Nasal washings and aspirates are unacceptable for Xpert Xpress SARS-CoV-2/FLU/RSV testing.  Fact Sheet for Patients: BloggerCourse.com  Fact Sheet for Healthcare Providers: SeriousBroker.it  This test is not yet approved or cleared by the [place] FDA and has been authorized for detection and/or diagnosis of SARS-CoV-2 by FDA under an Emergency Use Authorization (EUA). This EUA will remain in effect (meaning this test can be used) for the duration of the COVID-19 declaration under Section 564(b)(1) of the Act, 21 U.S.C. section 360bbb-3(b)(1), unless  the authorization is terminated or revoked.  Performed at [facility], [address], [place], [place] [address]          Radiology Studies: DG Ankle Complete Left  Result Date: 12/12/2020 CLINICAL DATA:  A 87 year old female presents for evaluation of cellulitis in the lower leg. Reportedly with significant swelling in the LEFT lower leg. EXAM: LEFT ANKLE COMPLETE - 3+ VIEW COMPARISON:  Tibia and fibula evaluation from 2020. FINDINGS: Degenerative changes are noted about the ankle. Plantar and Achilles enthesopathic changes are noted. Diffuse soft tissue swelling about the visualized LEFT lower extremity. No acute fracture or bony abnormality. IMPRESSION: Degenerative changes about the ankle. Diffuse soft tissue swelling about the visualized LEFT lower extremity. No acute bony abnormality.  Electronically Signed   By: Zetta Bills M.D.   On: 12/12/2020 13:23   US Venous Img Lower Unilateral Left  Result Date: 12/12/2020 CLINICAL DATA:  Left lower extremity edema EXAM: LEFT LOWER EXTREMITY VENOUS DOPPLER ULTRASOUND TECHNIQUE: Gray-scale sonography with graded compression, as well as color Doppler and duplex ultrasound were performed to evaluate the lower extremity deep venous systems from the level of the common femoral vein and including the common femoral, femoral, profunda femoral, popliteal and calf veins including the posterior tibial, peroneal and gastrocnemius veins when visible. The superficial great saphenous vein was also interrogated. Spectral Doppler was utilized to evaluate flow at rest and with distal augmentation maneuvers in the common femoral, femoral and popliteal veins. COMPARISON:  None. FINDINGS: Contralateral Common Femoral Vein: Respiratory phasicity is normal and symmetric with the symptomatic side. No evidence of thrombus. Normal compressibility. Common Femoral Vein: Noncompressible. The lumen is expanded and filled with low-level echogenic material. Scattered channels of flow are present on color Doppler imaging. Findings are consistent with acute to subacute thrombus. Saphenofemoral Junction: Thrombus extends just into the origin of the saphenofemoral junction. Profunda Femoral Vein: Thrombus extends into the profunda femoral vein. Femoral Vein: Thrombus extends throughout the femoral vein in the thigh with areas of occlusive thrombus in the mid and distal thigh. Popliteal Vein: Thrombus extends through the popliteal vein. Calf Veins: Thrombus extends into the calf veins. Superficial Great Saphenous Vein: No evidence of thrombus. Normal compressibility. Venous Reflux:  None. Other Findings:  None. IMPRESSION: Positive for extensive acute to subacute DVT throughout the left lower extremity from the common femoral vein into the calf veins. Electronically Signed   By: Jacqulynn Cadet M.D.   On: 12/12/2020 13:41        Scheduled Meds:  multivitamin with minerals  1 tablet Oral Daily   Continuous Infusions:  heparin 1,100 Units/hr (12/13/20 1415)     LOS: 2 days    Time spent: 25 mins     Wyvonnia Dusky, MD [facility] Pager [phone]  If 7PM-7AM, please contact night-coverage 12/14/2020, 7:45 AM

## 2020-12-14 NOTE — Progress Notes (Signed)
Patient is confused and disoriented x4. She refuses to tell nurse and nurse tech her DOB and birthday. She continues to hit, scratch, punch, and slap staff members. Patient attempted to bite staff and spits on them. Patient requires 2-3 staff members to safely provide pericare, turns, and assess adequately as she is confused, agitated, and combative. Bed alarm on, IV lines, tele leads hidden. Mitts placed on patient to prevent lines from being dislodged. Will continue to monitor.

## 2020-12-15 ENCOUNTER — Encounter: Payer: Self-pay | Admitting: Vascular Surgery

## 2020-12-15 ENCOUNTER — Other Ambulatory Visit: Payer: Medicare HMO | Admitting: Nurse Practitioner

## 2020-12-15 DIAGNOSIS — I82409 Acute embolism and thrombosis of unspecified deep veins of unspecified lower extremity: Secondary | ICD-10-CM

## 2020-12-15 LAB — BASIC METABOLIC PANEL
Anion gap: 3 — ABNORMAL LOW (ref 5–15)
BUN: 14 mg/dL (ref 8–23)
CO2: 25 mmol/L (ref 22–32)
Calcium: 8.4 mg/dL — ABNORMAL LOW (ref 8.9–10.3)
Chloride: 108 mmol/L (ref 98–111)
Creatinine, Ser: 0.67 mg/dL (ref 0.44–1.00)
GFR, Estimated: >60 mL/min (ref >60)
Glucose, Bld: 80 mg/dL (ref 70–99)
Potassium: 4 mmol/L (ref 3.5–5.1)
Sodium: 136 mmol/L (ref 135–145)

## 2020-12-15 LAB — CBC
HCT: 24.6 % — ABNORMAL LOW (ref 36.0–46.0)
Hemoglobin: 7.4 g/dL — ABNORMAL LOW (ref 12.0–15.0)
MCH: 29.6 pg (ref 26.0–34.0)
MCHC: 30.1 g/dL (ref 30.0–36.0)
MCV: 98.4 fL (ref 80.0–100.0)
Platelets: 273 10*3/uL (ref 150–400)
RBC: 2.5 MIL/uL — ABNORMAL LOW (ref 3.87–5.11)
RDW: 14.9 % (ref 11.5–15.5)
WBC: 4.9 10*3/uL (ref 4.0–10.5)
nRBC: 0 % (ref 0.0–0.2)

## 2020-12-15 LAB — GLUCOSE, CAPILLARY
Glucose-Capillary: 68 mg/dL — ABNORMAL LOW (ref 70–99)
Glucose-Capillary: 105 mg/dL — ABNORMAL HIGH (ref 70–99)

## 2020-12-15 LAB — HEPARIN LEVEL (UNFRACTIONATED): Heparin Unfractionated: 0.51 IU/mL (ref 0.30–0.70)

## 2020-12-15 MED ORDER — APIXABAN 5 MG PO TABS
5.0000 mg | ORAL_TABLET | Freq: Two times a day (BID) | ORAL | Status: DC
  Administered 2020-12-15: 5 mg via ORAL
  Filled 2020-12-15 (×3): qty 1

## 2020-12-15 NOTE — Plan of Care (Signed)

## 2020-12-15 NOTE — Progress Notes (Signed)
ANTICOAGULATION CONSULT NOTE  Pharmacy Consult for heparin infusion Indication: DVT  No Known Allergies  Patient Measurements: Height: 5\' 6"  (167.6 cm) Weight: 86.5 kg (190 lb 11.2 oz) IBW/kg (Calculated) : 59.3  Vital Signs: Temp: 98.2 F (36.8 C) (12/06 0426) Temp Source: Oral (12/05 2047) BP: 113/71 (12/06 0426) Pulse Rate: 72 (12/06 0426)  Labs: Recent Labs    12/12/20 1238 12/12/20 1452 12/13/20 0140 12/13/20 1043 12/13/20 1801 12/14/20 0511 12/15/20 0254  HGB  --   --  7.1*  --   --  7.6* 7.4*  HCT  --   --  23.6*  --   --  25.5* 24.6*  PLT  --   --  287  --   --  282 273  APTT  --  39*  --   --   --   --   --   LABPROT  --  14.9  --   --   --   --   --   INR  --  1.2  --   --   --   --   --   HEPARINUNFRC   < >  --  0.43 0.68 0.40 0.52 0.51  CREATININE  --   --   --  0.82  --  0.74 0.67   < > = values in this interval not displayed.     Estimated Creatinine Clearance: 49.7 mL/min (by C-G formula based on SCr of 0.67 mg/dL).   Medical History: Past Medical History:  Diagnosis Date   Cellulitis 10/28/2016   bilateral lower extremities.     Assessment: 87 y.o. female with medical history significant for CHF, hypertension, chronic venous stasis of both lower extremities and lymphedema, stage III chronic kidney disease and vitamin B12 deficiency anemia who was brought into the ER for significant LLE edema. According to dispense history (Med Rec not yet complete) she is on no chronic anticoagulation rior to admission. Positive for extensive acute to subacute DVT throughout the left lower extremity from the common femoral vein into the calf veins  Goal of Therapy:  Heparin level 0.3-0.7 units/ml Monitor platelets by anticoagulation protocol: Yes  Date/time Level  Comment 1204 0140  HL 0.43 Therapeutic 1204 1836 HL 0.40 Therapeutic x2 @ 1100 units/hr 1205 0511 HL 0.52 Therapeutic 1206 0254      HL 0.51            Therapeutic X 4    Plan:  12/6:  HL @  0254 = 0.51, therapeutic X 4 Will continue pt on current rate and recheck HL on 12/7 with AM labs.   14/7, PharmD Clinical Pharmacist 12/15/2020 6:04 AM

## 2020-12-15 NOTE — Progress Notes (Signed)
PROGRESS NOTE   HPI was taken from Dr. Blaine Hamper: [name] [name] is a 87 y.o. female with medical history significant of dCHF, chronic venous insufficiency change and lymphedema in both legs, CKD stage IIIb, dementia, leg cellulitis, UTI, hypertension, anemia due to vitamin B-12 deficiency, who presents with worsening leg edema.   Patient has history of chronic venous insufficiency change and lymphedema in both legs. Per his son (I called her son by phone), patient was noted to have worsening left leg edema in the past several days.  Her leg edema is very asymmetric. Patient has left leg pain.  No fever or chills.  Denies chest pain, cough, shortness breath.  Denies nausea, vomiting, diarrhea or abdominal pain.  Denies symptoms of UTI.  Patient has a big blister in left heel, no surrounding erythema.  No recent fall.  No rectal bleeding or dark stool.   ED Course: pt was found to have WBC 4.5, electrolytes renal function okay, temperature normal, blood pressure 154/77, heart rate 78, oxygen saturation 98% on room air.  X-ray of left ankle is negative for bony fracture, but showed degenerative change.  LE Doppler showed left leg extensive DVT, positive for extensive acute to subacute DVT throughout the left lower extremity from the common femoral vein into the calf veins. Patient is admit to to med-Surg bed as inpt. Dr. Feliberto Gottron of [facility] is consulted.  Hospital course from Dr. Jimmye Norman 12/4-12/6/22: Pt was found to have DVT on admission. Pt was started on IV heparin and s/p catheter directed thrombolysis of left superficial femoral & femoral veins and mechanical thrombectomy to left popliteal vein, superficial femoral vein, common femoral vein & distal external iliac vein on 12/14/20 as per vascular surg. Pt was taken off of IV heparin and switched to po eliquis on 12/15/20 as per vascular surg. If pt and H&H remain stable tomorrow, pt can be d/c home. Pt will need EMS transport back home.    [name] [name]   [id number] DOB: 01-30-1926 DOA: 12/12/2020 PCP: Leonel Ramsay, MD   Assessment & Plan:   Principal Problem:   DVT (deep venous thrombosis) ([place]) Active Problems:   Anemia due to vitamin B12 deficiency   Stage 3b chronic kidney disease (HCC)   Chronic diastolic CHF (congestive heart failure) (HCC)   HTN (hypertension)   Blister of left foot   Deep vein thrombosis (DVT) of left lower extremity ([place])   DVT: LE doppler is positive for extensive acute to subacute DVT throughout the left lower extremity from the common femoral vein into the calf veins. D/c IV heparin and started on eliquis as per vasc surg. S/p  mechanical thrombectomy & chemical thrombolysis on 12/14/20 as per vascular surg. Tylenol, percocet prn for pain. Provoked DVT as pt has not walked in 3 years as per pt's son. Hx of chronic venous insufficieny & lymphedema in both legs. Vasc surg following and recs apprec   Anemia due to vitamin B12 deficiency: continue on multivitamin. B12 is 452, improved. Will transfuse if Hb <7.0   CKDIIIb: Cr is stable and better than baseline     Chronic diastolic CHF: echo on [id number] showed EF of 60 to 65% with grade 2 diastolic dysfunction.  CHF seem to be compensated. Monitor I/Os   Blister of left foot: POA. Does not seem to be infected. Recs as per podiatry   Dementia: continue w/ supportive care    DVT prophylaxis: IV heparin drp  Code Status: DNR Family Communication: discussed pt's care w/ pt's  son, [name], and answered his questions  Disposition Plan: likely d/c back home. Pt has been bedbound for about 3 years   Level of care: Telemetry Medical  Status is: Inpatient  Remains inpatient appropriate because: IV heparin changed to po eliquis. Can d/c home tomorrow if pt and H&H are stable. Will need EMS transport home.        Consultants:  Vascular surg Podiatry   Procedures:  Antimicrobials:   Subjective: Pt c/o fatigue   Objective: Vitals:   12/14/20  1630 12/14/20 1643 12/14/20 2047 12/15/20 0426  BP: (!) 112/56 (!) 148/68 122/60 113/71  Pulse:  64 68 72  Resp: 11 16  16   Temp:  98 F (36.7 C) (!) 97.5 F (36.4 C) 98.2 F (36.8 C)  TempSrc:   Oral   SpO2:  97% 100% 95%  Weight:      Height:        Intake/Output Summary (Last 24 hours) at 12/15/2020 0741 Last data filed at 12/15/2020 0433 Gross per 24 hour  Intake 180 ml  Output 550 ml  Net -370 ml   Filed Weights   12/13/20 0500 12/14/20 0500 12/14/20 1354  Weight: 84.7 kg 86.5 kg 86.5 kg    Examination:  General exam: Appears calm & comfortable  Respiratory system: clear breath sounds b/l Cardiovascular system: S1 & S2+. No rubs or clicks  Gastrointestinal system: Abd is soft, NT, obese & hypoactive bowel sounds  Central nervous system: Alert and oriented to person only. Moves all extremities  Psychiatry: judgement and insight appear poor. Appropriate mood and affect     Data Reviewed: I have personally reviewed following labs and imaging studies  CBC: Recent Labs  Lab 12/12/20 1238 12/13/20 0140 12/14/20 0511 12/15/20 0254  WBC 4.5 4.6 4.7 4.9  NEUTROABS 2.3  --   --   --   HGB 8.5* 7.1* 7.6* 7.4*  HCT 28.8* 23.6* 25.5* 24.6*  MCV 98.6 95.9 95.5 98.4  PLT 330 287 282 273   Basic Metabolic Panel: Recent Labs  Lab 12/12/20 1238 12/13/20 1043 12/14/20 0511 12/15/20 0254  NA 139 139 141 136  K 4.4 4.0 4.1 4.0  CL 109 109 110 108  CO2 22 26 26 25   GLUCOSE 76 95 86 80  BUN 25* 19 17 14   CREATININE 0.91 0.82 0.74 0.67  CALCIUM 8.3* 8.4* 8.5* 8.4*   GFR: Estimated Creatinine Clearance: 49.7 mL/min (by C-G formula based on SCr of 0.67 mg/dL). Liver Function Tests: No results for input(s): AST, ALT, ALKPHOS, BILITOT, PROT, ALBUMIN in the last 168 hours. No results for input(s): LIPASE, AMYLASE in the last 168 hours. No results for input(s): AMMONIA in the last 168 hours. Coagulation Profile: Recent Labs  Lab 12/12/20 1452  INR 1.2   Cardiac  Enzymes: No results for input(s): CKTOTAL, CKMB, CKMBINDEX, TROPONINI in the last 168 hours. BNP (last 3 results) No results for input(s): PROBNP in the last 8760 hours. HbA1C: No results for input(s): HGBA1C in the last 72 hours. CBG: Recent Labs  Lab 12/13/20 0801 12/14/20 0752  GLUCAP 86 81   Lipid Profile: No results for input(s): CHOL, HDL, LDLCALC, TRIG, CHOLHDL, LDLDIRECT in the last 72 hours. Thyroid Function Tests: No results for input(s): TSH, T4TOTAL, FREET4, T3FREE, THYROIDAB in the last 72 hours. Anemia Panel: Recent Labs    12/13/20 1043  VITAMINB12 452   Sepsis Labs: No results for input(s): PROCALCITON, LATICACIDVEN in the last 168 hours.  Recent Results (from the past  240 hour(s))  Resp Panel by RT-PCR (Flu A&B, Covid) Nasopharyngeal Swab     Status: None   Collection Time: 12/12/20  2:52 PM   Specimen: Nasopharyngeal Swab; Nasopharyngeal(NP) swabs in vial transport medium  Result Value Ref Range Status   SARS Coronavirus 2 by RT PCR NEGATIVE NEGATIVE Final    Comment: (NOTE) SARS-CoV-2 target nucleic acids are NOT DETECTED.  The SARS-CoV-2 RNA is generally detectable in upper respiratory specimens during the acute phase of infection. The lowest concentration of SARS-CoV-2 viral copies this assay can detect is 138 copies/mL. A negative result does not preclude SARS-Cov-2 infection and should not be used as the sole basis for treatment or other patient management decisions. A negative result may occur with  improper specimen collection/handling, submission of specimen other than nasopharyngeal swab, presence of viral mutation(s) within the areas targeted by this assay, and inadequate number of viral copies(<138 copies/mL). A negative result must be combined with clinical observations, patient history, and epidemiological information. The expected result is Negative.  Fact Sheet for Patients:  EntrepreneurPulse.com.au  Fact Sheet for  Healthcare Providers:  IncredibleEmployment.be  This test is no t yet approved or cleared by the [place] FDA and  has been authorized for detection and/or diagnosis of SARS-CoV-2 by FDA under an Emergency Use Authorization (EUA). This EUA will remain  in effect (meaning this test can be used) for the duration of the COVID-19 declaration under Section 564(b)(1) of the Act, 21 U.S.C.section 360bbb-3(b)(1), unless the authorization is terminated  or revoked sooner.       Influenza A by PCR NEGATIVE NEGATIVE Final   Influenza B by PCR NEGATIVE NEGATIVE Final    Comment: (NOTE) The Xpert Xpress SARS-CoV-2/FLU/RSV plus assay is intended as an aid in the diagnosis of influenza from Nasopharyngeal swab specimens and should not be used as a sole basis for treatment. Nasal washings and aspirates are unacceptable for Xpert Xpress SARS-CoV-2/FLU/RSV testing.  Fact Sheet for Patients: EntrepreneurPulse.com.au  Fact Sheet for Healthcare Providers: IncredibleEmployment.be  This test is not yet approved or cleared by the [place] FDA and has been authorized for detection and/or diagnosis of SARS-CoV-2 by FDA under an Emergency Use Authorization (EUA). This EUA will remain in effect (meaning this test can be used) for the duration of the COVID-19 declaration under Section 564(b)(1) of the Act, 21 U.S.C. section 360bbb-3(b)(1), unless the authorization is terminated or revoked.  Performed at [facility], [address] [place], [place] [address]          Radiology Studies: PERIPHERAL VASCULAR CATHETERIZATION  Result Date: 12/14/2020 See surgical note for result.       Scheduled Meds:  multivitamin with minerals  1 tablet Oral Daily   Continuous Infusions:  sodium chloride 50 mL/hr at 12/15/20 0258   heparin 1,100 Units/hr (12/14/20 1753)     LOS: 3 days    Time spent: 15 mins     Wyvonnia Dusky, MD [facility] Pager [phone]  If 7PM-7AM, please contact night-coverage 12/15/2020, 7:41 AM

## 2020-12-15 NOTE — Progress Notes (Signed)
[facility] Daily Progress Note  12/14/20: 1.   US guidance for vascular access to left popliteal vein 2.   Catheter placement into left common iliac vein from left popliteal approach 3.   IVC gram and left lower extremity venogram 4.   Catheter directed thrombolysis with 4 mg of tPA to the left superficial femoral and femoral veins  5.   Mechanical thrombectomy to left popliteal vein, superficial femoral vein, common femoral vein, and distal external iliac vein with the penumbra CAT 12 device 6.   PTA of superficial femoral vein and common femoral vein with 8 mm balloon as well as percutaneous transluminal angioplasty of the common femoral vein with a 10 mm balloon  Subjective: Patient without complaint this AM.  No acute issues overnight.  Objective: Vitals:   12/14/20 2047 12/15/20 0426 12/15/20 0752 12/15/20 1126  BP: 122/60 113/71 (!) 113/55 (!) 122/55  Pulse: 68 72 64 76  Resp:  16 18 18   Temp: (!) 97.5 F (36.4 C) 98.2 F (36.8 C) 98.4 F (36.9 C) 98.2 F (36.8 C)  TempSrc: Oral     SpO2: 100% 95% 95% 98%  Weight:      Height:        Intake/Output Summary (Last 24 hours) at 12/15/2020 1303 Last data filed at 12/15/2020 0433 Gross per 24 hour  Intake 180 ml  Output 550 ml  Net -370 ml   Physical Exam: A&Ox1, NAD CV: RRR Pulmonary: CTA Bilaterally Abdomen: Soft, Nontender, Nondistended Vascular:  Left lower extremity: Compression wrap removed.  Thigh soft.  Calf soft.  Extremities warm distally in toes.  Extremity placed back into space boot.   Laboratory: CBC    Component Value Date/Time   WBC 4.9 12/15/2020 0254   HGB 7.4 (L) 12/15/2020 0254   HCT 24.6 (L) 12/15/2020 0254   PLT 273 12/15/2020 0254   BMET    Component Value Date/Time   NA 136 12/15/2020 0254   K 4.0 12/15/2020 0254   CL 108 12/15/2020 0254   CO2 25 12/15/2020 0254   GLUCOSE 80 12/15/2020 0254   BUN 14 12/15/2020 0254   CREATININE 0.67 12/15/2020 0254   CALCIUM  8.4 (L) 12/15/2020 0254   GFRNONAA >60 12/15/2020 0254   GFRAA 41 (L) 11/07/2018 1032   Assessment/Planning: The patient is a 87 year old female who presented with extensive left lower extremity DVT s/p peripheral thrombectomy / thrombolysis - POD#1  1) patient without complaint this AM.  No acute issues overnight.  Doing well postprocedure. 2) compression wrap removed.  Extremity remains soft and warm. 3) will transition from heparin to Eliquis.  Renal function is normal - will dose 5 mg twice daily 4) okay for discharge when the patient is medically stable 5) we will continue to follow the patient's DVT in the outpatient setting.  We will place discharge follow-up in chart.  Discussed with Dr. 99 Mohamedamin Nifong PA-C 12/15/2020 1:03 PM

## 2020-12-16 LAB — BASIC METABOLIC PANEL
Anion gap: 4 — ABNORMAL LOW (ref 5–15)
BUN: 17 mg/dL (ref 8–23)
CO2: 25 mmol/L (ref 22–32)
Calcium: 8.4 mg/dL — ABNORMAL LOW (ref 8.9–10.3)
Chloride: 107 mmol/L (ref 98–111)
Creatinine, Ser: 0.82 mg/dL (ref 0.44–1.00)
GFR, Estimated: >60 mL/min (ref >60)
Glucose, Bld: 100 mg/dL — ABNORMAL HIGH (ref 70–99)
Potassium: 4.4 mmol/L (ref 3.5–5.1)
Sodium: 136 mmol/L (ref 135–145)

## 2020-12-16 LAB — GLUCOSE, CAPILLARY
Glucose-Capillary: 84 mg/dL (ref 70–99)
Glucose-Capillary: 106 mg/dL — ABNORMAL HIGH (ref 70–99)

## 2020-12-16 LAB — CBC
HCT: 24.2 % — ABNORMAL LOW (ref 36.0–46.0)
Hemoglobin: 7.3 g/dL — ABNORMAL LOW (ref 12.0–15.0)
MCH: 28.9 pg (ref 26.0–34.0)
MCHC: 30.2 g/dL (ref 30.0–36.0)
MCV: 95.7 fL (ref 80.0–100.0)
Platelets: 279 10*3/uL (ref 150–400)
RBC: 2.53 MIL/uL — ABNORMAL LOW (ref 3.87–5.11)
RDW: 15.3 % (ref 11.5–15.5)
WBC: 5.4 10*3/uL (ref 4.0–10.5)
nRBC: 0 % (ref 0.0–0.2)

## 2020-12-16 MED ORDER — APIXABAN 5 MG PO TABS: ORAL_TABLET | ORAL | 0 refills | Status: DC

## 2020-12-16 MED ORDER — APIXABAN 5 MG PO TABS: 5.0000 mg | ORAL_TABLET | Freq: Two times a day (BID) | ORAL | 0 refills | Status: DC

## 2020-12-16 NOTE — Discharge Summary (Signed)
Physician Discharge Summary  [name] [name] [id number] DOB: Jul 05, 1926 DOA: 12/12/2020  PCP: Leonel Ramsay, MD  Admit date: 12/12/2020 Discharge date: 12/16/2020  Admitted From: Home Disposition: Home health  Recommendations for Outpatient Follow-up:  Follow up with PCP within 1-2 weeks to check CBC Follow up with vascular surgery for DVT monitoring.   Nursing and Physical therapy  Discharge Condition:stable, improved CODE STATUS:  Code Status: DNR  Regular healthy diet  Brief/Interim Summary: Pt presented to ED with lower extremity swelling which was worse on left. Doppler revealed extensive left LE DVT. Vascular surgery was consulted and performed thrombectomy. She is bed bound at baseline which likely contributed to her coagulability. Her respiratory status and other chronic conditions remained stable throughout her admission. She was started on treatment dose of eliquis prior to discharge which will continue for a week and then decreased to 5mg  BID. Patient will have to follow up with PCP and vascular surgery to discuss the long-term risks/benefits of continuing anticoagulation.  Palliative was consulted this admission and plans to follow up outpatient.  Patient was discharged in overall good status with home health.   Discharge Diagnoses:  Principal Problem:   DVT (deep venous thrombosis) (HCC) Active Problems:   Anemia due to vitamin B12 deficiency   Stage 3b chronic kidney disease (HCC)   Chronic diastolic CHF (congestive heart failure) (HCC)   HTN (hypertension)   Blister of left foot   Deep vein thrombosis (DVT) of left lower extremity (HCC)    Allergies as of 12/16/2020   No Known Allergies      Medication List     STOP taking these medications    senna-docusate 8.6-50 MG tablet Commonly known as: Senokot-S       TAKE these medications    acetaminophen 500 MG tablet Commonly known as: TYLENOL Take 1 tablet (500 mg total) by mouth every 6 (six)  hours as needed for mild pain or headache (fever >/= 101).   apixaban 5 MG Tabs tablet Commonly known as: ELIQUIS Take 2 tablets (10 mg total) by mouth 2 (two) times daily for 5 days, THEN 1 tablet (5 mg total) 2 (two) times daily. Start taking on: December 16, 2020   feeding supplement Liqd Take 237 mLs by mouth 3 (three) times daily between meals.   polyethylene glycol 17 g packet Commonly known as: MIRALAX / GLYCOLAX Take 17 g by mouth 2 (two) times daily.        Follow-up Information     Dew, Erskine Squibb, MD Follow up in 2 week(s).   Specialties: Vascular Surgery, Radiology, Interventional Cardiology Why: Can see Dew or Arna Medici. DVT. Will need LLE DVT with visit. Contact information: [place] [address] [phone]         Leonel Ramsay, MD Follow up.   Specialty: Infectious Diseases Contact information: [place] [address] [phone]         Kris Hartmann, NP Follow up on 12/30/2020.   Specialty: Vascular Surgery Why: at 1030 Contact information: [place] [address] [phone]                No Known Allergies  Consultations: Vascular surgery  Procedures/Studies: DG Ankle Complete Left  Result Date: 12/12/2020 CLINICAL DATA:  A 87 year old female presents for evaluation of cellulitis in the lower leg. Reportedly with significant swelling in the LEFT lower leg. EXAM: LEFT ANKLE COMPLETE - 3+ VIEW COMPARISON:  Tibia and fibula evaluation from  2020. FINDINGS: Degenerative changes are noted about the ankle. Plantar and Achilles enthesopathic changes are noted. Diffuse soft tissue swelling about the visualized LEFT lower extremity. No acute fracture or bony abnormality. IMPRESSION: Degenerative changes about the ankle. Diffuse soft tissue swelling about the visualized LEFT lower extremity. No acute bony abnormality. Electronically Signed   By: Donzetta Kohut M.D.   On: 12/12/2020 13:23    PERIPHERAL VASCULAR CATHETERIZATION  Result Date: 12/14/2020 See surgical note for result.  US Venous Img Lower Unilateral Left  Result Date: 12/12/2020 CLINICAL DATA:  Left lower extremity edema EXAM: LEFT LOWER EXTREMITY VENOUS DOPPLER ULTRASOUND TECHNIQUE: Gray-scale sonography with graded compression, as well as color Doppler and duplex ultrasound were performed to evaluate the lower extremity deep venous systems from the level of the common femoral vein and including the common femoral, femoral, profunda femoral, popliteal and calf veins including the posterior tibial, peroneal and gastrocnemius veins when visible. The superficial great saphenous vein was also interrogated. Spectral Doppler was utilized to evaluate flow at rest and with distal augmentation maneuvers in the common femoral, femoral and popliteal veins. COMPARISON:  None. FINDINGS: Contralateral Common Femoral Vein: Respiratory phasicity is normal and symmetric with the symptomatic side. No evidence of thrombus. Normal compressibility. Common Femoral Vein: Noncompressible. The lumen is expanded and filled with low-level echogenic material. Scattered channels of flow are present on color Doppler imaging. Findings are consistent with acute to subacute thrombus. Saphenofemoral Junction: Thrombus extends just into the origin of the saphenofemoral junction. Profunda Femoral Vein: Thrombus extends into the profunda femoral vein. Femoral Vein: Thrombus extends throughout the femoral vein in the thigh with areas of occlusive thrombus in the mid and distal thigh. Popliteal Vein: Thrombus extends through the popliteal vein. Calf Veins: Thrombus extends into the calf veins. Superficial Great Saphenous Vein: No evidence of thrombus. Normal compressibility. Venous Reflux:  None. Other Findings:  None. IMPRESSION: Positive for extensive acute to subacute DVT throughout the left lower extremity from the common femoral vein into the calf veins.  Electronically Signed   By: Malachy Moan M.D.   On: 12/12/2020 13:41    Subjective: Patient feels overall well today. She has no complaints. She would like to go home.   Discharge Exam: Vitals:   12/16/20 0347 12/16/20 0735  BP: (!) 93/54 104/85  Pulse: 62 64  Resp: 14 15  Temp: 98.2 F (36.8 C) 98.7 F (37.1 C)  SpO2: 98% 98%    General: Pt is alert, awake, not in acute distress Cardiovascular: RRR, S1/S2 +, systolic murmur Respiratory: CTA bilaterally, no wheezing, no rhonchi Abdominal: Soft, NT, ND Extremities: no edema, no cyanosis. Bilateral LE warm to touch.  Labs: Basic Metabolic Panel: Recent Labs  Lab 12/12/20 1238 12/13/20 1043 12/14/20 0511 12/15/20 0254 12/16/20 0148  NA 139 139 141 136 136  K 4.4 4.0 4.1 4.0 4.4  CL 109 109 110 108 107  CO2 22 26 26 25 25   GLUCOSE 76 95 86 80 100*  BUN 25* 19 17 14 17   CREATININE 0.91 0.82 0.74 0.67 0.82  CALCIUM 8.3* 8.4* 8.5* 8.4* 8.4*   CBC: Recent Labs  Lab 12/12/20 1238 12/13/20 0140 12/14/20 0511 12/15/20 0254 12/16/20 0148  WBC 4.5 4.6 4.7 4.9 5.4  NEUTROABS 2.3  --   --   --   --   HGB 8.5* 7.1* 7.6* 7.4* 7.3*  HCT 28.8* 23.6* 25.5* 24.6* 24.2*  MCV 98.6 95.9 95.5 98.4 95.7  PLT 330 287 282 273 279  Microbiology Recent Results (from the past 240 hour(s))  Resp Panel by RT-PCR (Flu A&B, Covid) Nasopharyngeal Swab     Status: None   Collection Time: 12/12/20  2:52 PM   Specimen: Nasopharyngeal Swab; Nasopharyngeal(NP) swabs in vial transport medium  Result Value Ref Range Status   SARS Coronavirus 2 by RT PCR NEGATIVE NEGATIVE Final    Comment: (NOTE) SARS-CoV-2 target nucleic acids are NOT DETECTED.  The SARS-CoV-2 RNA is generally detectable in upper respiratory specimens during the acute phase of infection. The lowest concentration of SARS-CoV-2 viral copies this assay can detect is 138 copies/mL. A negative result does not preclude SARS-Cov-2 infection and should not be used as the  sole basis for treatment or other patient management decisions. A negative result may occur with  improper specimen collection/handling, submission of specimen other than nasopharyngeal swab, presence of viral mutation(s) within the areas targeted by this assay, and inadequate number of viral copies(<138 copies/mL). A negative result must be combined with clinical observations, patient history, and epidemiological information. The expected result is Negative.  Fact Sheet for Patients:  EntrepreneurPulse.com.au  Fact Sheet for Healthcare Providers:  IncredibleEmployment.be  This test is no t yet approved or cleared by the [place] FDA and  has been authorized for detection and/or diagnosis of SARS-CoV-2 by FDA under an Emergency Use Authorization (EUA). This EUA will remain  in effect (meaning this test can be used) for the duration of the COVID-19 declaration under Section 564(b)(1) of the Act, 21 U.S.C.section 360bbb-3(b)(1), unless the authorization is terminated  or revoked sooner.       Influenza A by PCR NEGATIVE NEGATIVE Final   Influenza B by PCR NEGATIVE NEGATIVE Final    Comment: (NOTE) The Xpert Xpress SARS-CoV-2/FLU/RSV plus assay is intended as an aid in the diagnosis of influenza from Nasopharyngeal swab specimens and should not be used as a sole basis for treatment. Nasal washings and aspirates are unacceptable for Xpert Xpress SARS-CoV-2/FLU/RSV testing.  Fact Sheet for Patients: EntrepreneurPulse.com.au  Fact Sheet for Healthcare Providers: IncredibleEmployment.be  This test is not yet approved or cleared by the [place] FDA and has been authorized for detection and/or diagnosis of SARS-CoV-2 by FDA under an Emergency Use Authorization (EUA). This EUA will remain in effect (meaning this test can be used) for the duration of the COVID-19 declaration under Section 564(b)(1) of the  Act, 21 U.S.C. section 360bbb-3(b)(1), unless the authorization is terminated or revoked.  Performed at [facility], [address] [place], [place] [address]     Time coordinating discharge: Over 30 minutes  Richarda Osmond, MD  [facility] 12/16/2020, 11:08 AM Pager   If 7PM-7AM, please contact night-coverage [email or web address] Password [email or web address]

## 2020-12-16 NOTE — Care Management Important Message (Signed)
Important Message  Patient Details  Name: [name] [name] MRN: [id number] Date of Birth: [birth year]   Medicare Important Message Given:  Yes  I reviewed the Important Message from Medicare by phone [phone]) with her son, Margaree Sandhu with whom she lives with. He is in agreement with the discharge plan. I asked if he would like me to send a copy and he declined.  I thanked him for his time.  [name] A Elise Gladden 12/16/2020, 11:29 AM

## 2020-12-16 NOTE — Progress Notes (Signed)
[facility] Liaison note: ? ?This is a pending outpatient-based Palliative Care patient. Will continue to follow for disposition. ? ?Please call with any outpatient palliative questions or concerns. ? ?Thank you, ?Dee Curry, LPN ?[facility] Hospital Liaison ?[phone] ?

## 2020-12-16 NOTE — Plan of Care (Signed)

## 2020-12-16 NOTE — TOC Progression Note (Addendum)
Transition of Care [facility]) - Progression Note    Patient Details  Name: Matsue Strom MRN: [id number] Date of Birth: 1926-11-24  Transition [facility]) CM/SW Contact  Caryn Section, RN Phone Number: 12/16/2020, 11:19 AM  Clinical Narrative:   Patient is discharged today per care team.  Will need EMS to transport, and please call son with timing so he can ensure he or the caregivers are at home to receive patient.  As per son, home caregivers have been notified about patient's discharge today.    1250 pm Addendum:  Patient is 3rd on ems list.  Son or caregiver will be home.   Confirmed address of [address] [place]  Expected Discharge Plan: Home w Home Health Services Barriers to Discharge: Continued Medical Work up  Expected Discharge Plan and Services Expected Discharge Plan: Home w Home Health Services   Discharge Planning Services: CM Consult Post Acute Care Choice: Home Health Living arrangements for the past 2 months: Single Family Home Expected Discharge Date: 12/16/20                                     Social Determinants of Health (SDOH) Interventions    Readmission Risk Interventions No flowsheet data found.

## 2020-12-28 ENCOUNTER — Other Ambulatory Visit (INDEPENDENT_AMBULATORY_CARE_PROVIDER_SITE_OTHER): Payer: Self-pay | Admitting: Vascular Surgery

## 2020-12-29 ENCOUNTER — Other Ambulatory Visit (INDEPENDENT_AMBULATORY_CARE_PROVIDER_SITE_OTHER): Payer: Self-pay | Admitting: Nurse Practitioner

## 2020-12-29 DIAGNOSIS — I82402 Acute embolism and thrombosis of unspecified deep veins of left lower extremity: Secondary | ICD-10-CM

## 2020-12-30 ENCOUNTER — Encounter (INDEPENDENT_AMBULATORY_CARE_PROVIDER_SITE_OTHER): Payer: Medicare HMO | Admitting: Nurse Practitioner

## 2020-12-30 ENCOUNTER — Encounter (INDEPENDENT_AMBULATORY_CARE_PROVIDER_SITE_OTHER): Payer: Medicare HMO

## 2021-01-19 ENCOUNTER — Telehealth: Payer: Self-pay | Admitting: Nurse Practitioner

## 2021-01-19 NOTE — Telephone Encounter (Signed)
Spoke with patient's son [name], and have rescheduled the Palliative Consult to 02/03/21 @ 12:30 PM (due to patient was in the hospital at time of previous appointment on 12/15/20).

## 2021-01-28 ENCOUNTER — Ambulatory Visit: Payer: Medicare HMO | Admitting: Physician Assistant

## 2021-02-01 ENCOUNTER — Other Ambulatory Visit: Payer: Self-pay

## 2021-02-01 ENCOUNTER — Encounter: Payer: Medicare HMO | Attending: Physician Assistant | Admitting: Physician Assistant

## 2021-02-01 DIAGNOSIS — I110 Hypertensive heart disease with heart failure: Secondary | ICD-10-CM | POA: Insufficient documentation

## 2021-02-01 DIAGNOSIS — L89623 Pressure ulcer of left heel, stage 3: Secondary | ICD-10-CM | POA: Diagnosis present

## 2021-02-01 DIAGNOSIS — I5042 Chronic combined systolic (congestive) and diastolic (congestive) heart failure: Secondary | ICD-10-CM | POA: Insufficient documentation

## 2021-02-02 NOTE — Progress Notes (Signed)
WHITNY, SEMRAD ([id number]) Visit Report for 02/01/2021 Allergy List Details Patient Name: [name] [name], [name] [name] Date of Service: 02/01/2021 2:00 PM Medical Record Number: [id number] Patient Account Number: [id number] Date of Birth/Sex: August 24, 1926 (88 y.o. F) Treating RN: Cornell Barman Primary Care Shelsea Hangartner: Adrian Prows Other Clinician: Referring Jury Caserta: Adrian Prows Treating Tyrene Nader/Extender: Skipper Cliche in Treatment: 0 Allergies Active Allergies No Known Allergies Allergy Notes Electronic Signature(s) Signed: 02/02/2021 5:12:14 PM By: Gretta Cool, BSN, RN, CWS, Kim RN, BSN Entered By: Gretta Cool, BSN, RN, CWS, Kim on 02/01/2021 13:38:16 [name] [name] ([id number]) -------------------------------------------------------------------------------- Arrival Information Details Patient Name: [name] [name] Date of Service: 02/01/2021 2:00 PM Medical Record Number: [id number] Patient Account Number: [id number] Date of Birth/Sex: Mar 04, 1926 (88 y.o. F) Treating RN: Cornell Barman Primary Care Darell Saputo: Adrian Prows Other Clinician: Referring Algis Lehenbauer: Adrian Prows Treating Harrold Fitchett/Extender: Skipper Cliche in Treatment: 0 Visit Information Patient Arrived: Stretcher Arrival Time: 13:34 Accompanied By: EMS and Son Transfer Assistance: Stretcher Patient Identification Verified: Yes Secondary Verification Process Completed: Yes Patient Has Alerts: Yes Patient Alerts: NOT Diabetic History Since Last Visit Electronic Signature(s) Signed: 02/02/2021 5:12:14 PM By: Gretta Cool, BSN, RN, CWS, Kim RN, BSN Entered By: Gretta Cool, BSN, RN, CWS, Kim on 02/01/2021 13:36:48 [name] [name] ([id number]) -------------------------------------------------------------------------------- Clinic Level of Care Assessment Details Patient Name: [name] [name] Date of Service: 02/01/2021 2:00 PM Medical Record Number: [id number] Patient Account Number: [id number] Date of Birth/Sex: Jul 21, 1926 (88 y.o.  F) Treating RN: Cornell Barman Primary Care Savalas Monje: Adrian Prows Other Clinician: Referring Savio Albrecht: Adrian Prows Treating Fredick Schlosser/Extender: Skipper Cliche in Treatment: 0 Clinic Level of Care Assessment Items TOOL 1 Quantity Score []  - Use when EandM and Procedure is performed on INITIAL visit 0 ASSESSMENTS - Nursing Assessment / Reassessment X - General Physical Exam (combine w/ comprehensive assessment (listed just below) when performed on new 1 20 pt. evals) X- 1 25 Comprehensive Assessment (HX, ROS, Risk Assessments, Wounds Hx, etc.) ASSESSMENTS - Wound and Skin Assessment / Reassessment []  - Dermatologic / Skin Assessment (not related to wound area) 0 ASSESSMENTS - Ostomy and/or Continence Assessment and Care []  - Incontinence Assessment and Management 0 []  - 0 Ostomy Care Assessment and Management (repouching, etc.) PROCESS - Coordination of Care X - Simple Patient / Family Education for ongoing care 1 15 []  - 0 Complex (extensive) Patient / Family Education for ongoing care X- 1 10 Staff obtains Programmer, systems, Records, Test Results / Process Orders []  - 0 Staff telephones HHA, Nursing Homes / Clarify orders / etc []  - 0 Routine Transfer to another Facility (non-emergent condition) []  - 0 Routine Hospital Admission (non-emergent condition) X- 1 15 New Admissions / Biomedical engineer / Ordering NPWT, Apligraf, etc. []  - 0 Emergency Hospital Admission (emergent condition) PROCESS - Special Needs []  - Pediatric / Minor Patient Management 0 []  - 0 Isolation Patient Management []  - 0 Hearing / Language / Visual special needs []  - 0 Assessment of Community assistance (transportation, D/C planning, etc.) []  - 0 Additional assistance / Altered mentation []  - 0 Support Surface(s) Assessment (bed, cushion, seat, etc.) INTERVENTIONS - Miscellaneous []  - External ear exam 0 []  - 0 Patient Transfer (multiple staff / Civil Service fast streamer / Similar devices) []  -  0 Simple Staple / Suture removal (25 or less) []  - 0 Complex Staple / Suture removal (26 or more) []  - 0 Hypo/Hyperglycemic Management (do not check if billed separately) X- 1 15 Ankle / Brachial Index (ABI) - do not check if billed separately Has the patient been seen at the hospital  within the last three years: Yes Total Score: 100 Level Of Care: New/Established - Level 3 [name] [name], [name] [name] ([id number]) Electronic Signature(s) Signed: 02/02/2021 5:12:14 PM By: Gretta Cool, BSN, RN, CWS, Kim RN, BSN Entered By: Gretta Cool, BSN, RN, CWS, Kim on 02/01/2021 14:54:33 [name] [name] ([id number]) -------------------------------------------------------------------------------- Encounter Discharge Information Details Patient Name: [name] [name] Date of Service: 02/01/2021 2:00 PM Medical Record Number: [id number] Patient Account Number: [id number] Date of Birth/Sex: May 20, 1926 (88 y.o. F) Treating RN: Cornell Barman Primary Care Berit Raczkowski: Adrian Prows Other Clinician: Referring Gennie Eisinger: Adrian Prows Treating Karagan Lehr/Extender: Skipper Cliche in Treatment: 0 Encounter Discharge Information Items Post Procedure Vitals Discharge Condition: Stable Temperature (F): 97.6 Ambulatory Status: Stretcher Pulse (bpm): 64 Discharge Destination: Home Respiratory Rate (breaths/min): 16 Transportation: Ambulance Blood Pressure (mmHg): 192/79 Accompanied By: son and EMS Schedule Follow-up Appointment: Yes Clinical Summary of Care: Electronic Signature(s) Signed: 02/01/2021 2:59:15 PM By: Gretta Cool, BSN, RN, CWS, Kim RN, BSN Entered By: Gretta Cool, BSN, RN, CWS, Kim on 02/01/2021 14:59:14 [name] [name] ([id number]) -------------------------------------------------------------------------------- Lower Extremity Assessment Details Patient Name: [name] [name] Date of Service: 02/01/2021 2:00 PM Medical Record Number: [id number] Patient Account Number: [id number] Date of Birth/Sex: 04-22-1926 (88 y.o.  F) Treating RN: Cornell Barman Primary Care Cattie Tineo: Adrian Prows Other Clinician: Referring Cay Kath: Adrian Prows Treating Kharisma Glasner/Extender: Skipper Cliche in Treatment: 0 Edema Assessment Assessed: [name] [name]: Yes] [Right: No] Edema: [Left: Ye] [Right: s] Calf Left: Right: Point of Measurement: 31 cm From Medial Instep 43 cm Ankle Left: Right: Point of Measurement: 13 cm From Medial Instep 29 cm Knee To Floor Left: Right: From Medial Instep 42 cm Vascular Assessment Pulses: Dorsalis Pedis Palpable: [Left:No] Doppler Audible: [Left:Yes] Posterior Tibial Palpable: [Left:No] Doppler Audible: [Left:Yes] Blood Pressure: Brachial: [Left:172] Dorsalis Pedis: 130 Ankle: Posterior Tibial: 120 Ankle Brachial Index: [Left:0.76] Electronic Signature(s) Signed: 02/02/2021 5:12:14 PM By: Gretta Cool, BSN, RN, CWS, Kim RN, BSN Entered By: Gretta Cool, BSN, RN, CWS, Kim on 02/01/2021 14:03:28 [name] [name] ([id number]) -------------------------------------------------------------------------------- Multi Wound Chart Details Patient Name: [name] [name] Date of Service: 02/01/2021 2:00 PM Medical Record Number: [id number] Patient Account Number: [id number] Date of Birth/Sex: [birth year] (88 y.o. F) Treating RN: Cornell Barman Primary Care Langston Summerfield: Adrian Prows Other Clinician: Referring Chloris Marcoux: Adrian Prows Treating Nikos Anglemyer/Extender: Skipper Cliche in Treatment: 0 Vital Signs Height(in): 71 Pulse(bpm): 64 Weight(lbs): 170 Blood Pressure(mmHg): 192/79 Body Mass Index(BMI): 23.7 Temperature(F): 97.6 Respiratory Rate(breaths/min): 16 Photos: [N/A:N/A] Wound Location: Left, Medial Calcaneus N/A N/A Wounding Event: Pressure Injury N/A N/A Primary Etiology: Pressure Ulcer N/A N/A Comorbid History: Cataracts, Lymphedema, N/A N/A Hypertension, Peripheral Venous Disease, Osteoarthritis Date Acquired: 01/01/2021 N/A N/A Weeks of Treatment: 0 N/A N/A Wound Status: Open  N/A N/A Wound Recurrence: No N/A N/A Measurements L x W x D (cm) 2.2x2x0.3 N/A N/A Area (cm) : 3.456 N/A N/A Volume (cm) : 1.037 N/A N/A % Reduction in Area: 0.00% N/A N/A % Reduction in Volume: 0.00% N/A N/A Classification: Category/Stage III N/A N/A Exudate Amount: Medium N/A N/A Exudate Type: Serous N/A N/A Exudate Color: amber N/A N/A Wound Margin: Flat and Intact N/A N/A Granulation Amount: Large (67-100%) N/A N/A Granulation Quality: Red, Hyper-granulation N/A N/A Necrotic Amount: Small (1-33%) N/A N/A Exposed Structures: Fat Layer (Subcutaneous Tissue): N/A N/A Yes Fascia: No Tendon: No Muscle: No Joint: No Bone: No Epithelialization: None N/A N/A Treatment Notes Electronic Signature(s) Signed: 02/02/2021 5:12:14 PM By: Gretta Cool, BSN, RN, CWS, Kim RN, BSN Entered By: Gretta Cool, BSN, RN, CWS, Kim on 02/01/2021 14:25:09 [name] [name] ([id number]) -------------------------------------------------------------------------------- Multi-Disciplinary Care Plan Details Patient Name: [name] [name],  [name] [name] Date of Service: 02/01/2021 2:00 PM Medical Record Number: [id number] Patient Account Number: [id number] Date of Birth/Sex: 1926-01-14 (88 y.o. F) Treating RN: Cornell Barman Primary Care Declan Adamson: Adrian Prows Other Clinician: Referring Lenzy Kerschner: Adrian Prows Treating Lundyn Coste/Extender: Skipper Cliche in Treatment: 0 Active Inactive Nutrition Nursing Diagnoses: Imbalanced nutrition Impaired glucose control: actual or potential Potential for alteratiion in Nutrition/Potential for imbalanced nutrition Goals: Patient/caregiver agrees to and verbalizes understanding of need to use nutritional supplements and/or vitamins as prescribed Date Initiated: 02/01/2021 Target Resolution Date: 02/01/2021 Goal Status: Active Interventions: Provide education on nutrition Notes: Orientation to the Wound Care Program Nursing Diagnoses: Knowledge deficit related to the wound healing  center program Goals: Patient/caregiver will verbalize understanding of the [place] Date Initiated: 02/01/2021 Target Resolution Date: 02/01/2021 Goal Status: Active Interventions: Provide education on orientation to the wound center Notes: Pressure Nursing Diagnoses: Knowledge deficit related to causes and risk factors for pressure ulcer development Knowledge deficit related to management of pressures ulcers Goals: Patient will remain free from development of additional pressure ulcers Date Initiated: 02/01/2021 Target Resolution Date: 02/01/2021 Goal Status: Active Patient/caregiver will verbalize risk factors for pressure ulcer development Date Initiated: 02/01/2021 Target Resolution Date: 02/01/2021 Goal Status: Active Interventions: Assess: immobility, friction, shearing, incontinence upon admission and as needed Assess offloading mechanisms upon admission and as needed Notes: Wound/Skin Impairment [name] [name], [name] [name] ([id number]) Nursing Diagnoses: Impaired tissue integrity Knowledge deficit related to smoking impact on wound healing Knowledge deficit related to ulceration/compromised skin integrity Goals: Patient/caregiver will verbalize understanding of skin care regimen Date Initiated: 02/01/2021 Target Resolution Date: 02/01/2021 Goal Status: Active Ulcer/skin breakdown will have a volume reduction of 30% by week 4 Date Initiated: 02/01/2021 Target Resolution Date: 02/22/2021 Goal Status: Active Ulcer/skin breakdown will have a volume reduction of 50% by week 8 Date Initiated: 02/01/2021 Target Resolution Date: 03/22/2021 Goal Status: Active Ulcer/skin breakdown will have a volume reduction of 80% by week 12 Date Initiated: 02/01/2021 Target Resolution Date: 04/22/2021 Goal Status: Active Ulcer/skin breakdown will heal within 14 weeks Date Initiated: 02/01/2021 Target Resolution Date: 05/06/2021 Goal Status: Active Interventions: Assess  patient/caregiver ability to obtain necessary supplies Assess patient/caregiver ability to perform ulcer/skin care regimen upon admission and as needed Assess ulceration(s) every visit Treatment Activities: Skin care regimen initiated : 02/01/2021 Notes: Electronic Signature(s) Signed: 02/02/2021 5:12:14 PM By: Gretta Cool, BSN, RN, CWS, Kim RN, BSN Entered By: Gretta Cool, BSN, RN, CWS, Kim on 02/01/2021 14:24:49 [name] [name] ([id number]) -------------------------------------------------------------------------------- Pain Assessment Details Patient Name: [name] [name] Date of Service: 02/01/2021 2:00 PM Medical Record Number: [id number] Patient Account Number: [id number] Date of Birth/Sex: [birth year] (88 y.o. F) Treating RN: Cornell Barman Primary Care Macario Shear: Adrian Prows Other Clinician: Referring Gaynelle Pastrana: Adrian Prows Treating Yeudiel Mateo/Extender: Skipper Cliche in Treatment: 0 Active Problems Location of Pain Severity and Description of Pain Patient Has Paino No Site Locations Pain Management and Medication Current Pain Management: Notes Patient denies pain at this time. Electronic Signature(s) Signed: 02/02/2021 5:12:14 PM By: Gretta Cool, BSN, RN, CWS, Kim RN, BSN Entered By: Gretta Cool, BSN, RN, CWS, Kim on 02/01/2021 13:37:16 [name] [name] ([id number]) -------------------------------------------------------------------------------- Patient/Caregiver Education Details Patient Name: [name] [name] Date of Service: 02/01/2021 2:00 PM Medical Record Number: [id number] Patient Account Number: [id number] Date of Birth/Gender: 1926/09/08 (88 y.o. F) Treating RN: Cornell Barman Primary Care Physician: Adrian Prows Other Clinician: Referring Physician: Adrian Prows Treating Physician/Extender: Skipper Cliche in Treatment: 0 Education Assessment Education Provided To: Patient Education Topics Provided Nutrition: Handouts: Nutrition, Other: Daily Ensure Methods:  Explain/Verbal  Responses: State content correctly Tissue Oxygenation: Handouts: Skin Perfusion Tests, Other: ABi, TBI with AVVS Methods: Demonstration, Explain/Verbal Responses: State content correctly Welcome To [facility] [place] [facility] [place]: Handouts: Welcome To [facility] [place] Methods: Demonstration Responses: State content correctly Electronic Signature(s) Signed: 02/02/2021 5:12:14 PM By: Gretta Cool, BSN, RN, CWS, Kim RN, BSN Entered By: Gretta Cool, BSN, RN, CWS, Kim on 02/01/2021 14:56:37 [name] [name] ([id number]) -------------------------------------------------------------------------------- Wound Assessment Details Patient Name: [name] [name] Date of Service: 02/01/2021 2:00 PM Medical Record Number: [id number] Patient Account Number: [id number] Date of Birth/Sex: [birth year] (88 y.o. F) Treating RN: Cornell Barman Primary Care Oluwatobi Visser: Adrian Prows Other Clinician: Referring Imogine Carvell: Adrian Prows Treating Angeleigh Chiasson/Extender: Skipper Cliche in Treatment: 0 Wound Status Wound Number: 2 Primary Pressure Ulcer Etiology: Wound Location: Left, Medial Calcaneus Wound Status: Open Wounding Event: Pressure Injury Comorbid Cataracts, Lymphedema, Hypertension, Peripheral Date Acquired: 01/01/2021 History: Venous Disease, Osteoarthritis Weeks Of Treatment: 0 Clustered Wound: No Photos Wound Measurements Length: (cm) 2.2 Width: (cm) 2 Depth: (cm) 0.3 Area: (cm) 3.456 Volume: (cm) 1.037 % Reduction in Area: 0% % Reduction in Volume: 0% Epithelialization: None Tunneling: No Undermining: No Wound Description Classification: Category/Stage III Wound Margin: Flat and Intact Exudate Amount: Medium Exudate Type: Serous Exudate Color: amber Foul Odor After Cleansing: No Slough/Fibrino Yes Wound Bed Granulation Amount: Large (67-100%) Exposed Structure Granulation Quality: Red, Hyper-granulation Fascia Exposed: No Necrotic Amount: Small (1-33%) Fat Layer  (Subcutaneous Tissue) Exposed: Yes Necrotic Quality: Adherent Slough Tendon Exposed: No Muscle Exposed: No Joint Exposed: No Bone Exposed: No Treatment Notes Wound #2 (Calcaneus) Wound Laterality: Left, Medial Cleanser Wound Cleanser Discharge Instruction: Wash your hands with soap and water. Remove old dressing, discard into plastic bag and place into trash. Cleanse the wound with Wound Cleanser prior to applying a clean dressing using gauze sponges, not tissues or cotton balls. Do not scrub or use excessive force. Pat dry using gauze sponges, not tissue or cotton balls. [name] [name], [name] [name] ([id number]) Peri-Wound Care Topical Primary Dressing Hydrofera Blue Ready Transfer Foam, 2.5x2.5 (in/in) Quantity: 1 Discharge Instruction: Apply Hydrofera Blue Ready to wound bed as directed Secondary Dressing Kerlix 4.5 x 4.1 (in/yd) Quantity: 1 Discharge Instruction: Apply Kerlix 4.5 x 4.1 (in/yd) as instructed Secured With 46M Medipore H Soft Cloth Surgical Tape, 2x2 (in/yd) Compression Wrap Compression Stockings Add-Ons Electronic Signature(s) Signed: 02/01/2021 2:12:20 PM By: Worthy Keeler PA-C Signed: 02/02/2021 5:12:14 PM By: Gretta Cool, BSN, RN, CWS, Kim RN, BSN Entered By: Worthy Keeler on 02/01/2021 14:12:20 [name] [name], [name] [name] ([id number]) -------------------------------------------------------------------------------- Vitals Details Patient Name: [name] [name] Date of Service: 02/01/2021 2:00 PM Medical Record Number: [id number] Patient Account Number: [id number] Date of Birth/Sex: 08/09/1926 (88 y.o. F) Treating RN: Cornell Barman Primary Care Kassidi Elza: Adrian Prows Other Clinician: Referring Lemar Bakos: Adrian Prows Treating Tomi Paddock/Extender: Skipper Cliche in Treatment: 0 Vital Signs Time Taken: 13:37 Temperature (F): 97.6 Height (in): 71 Pulse (bpm): 64 Weight (lbs): 170 Respiratory Rate (breaths/min): 16 Body Mass Index (BMI): 23.7 Blood Pressure (mmHg):  192/79 Reference Range: 80 - 120 mg / dl Electronic Signature(s) Signed: 02/02/2021 5:12:14 PM By: Gretta Cool, BSN, RN, CWS, Kim RN, BSN Entered By: Gretta Cool, BSN, RN, CWS, Kim on 02/01/2021 13:38:04

## 2021-02-02 NOTE — Progress Notes (Signed)
ANITHA, KREISER ([id number]) Visit Report for 02/01/2021 Abuse Risk Screen Details Patient Name: [name] [name], [name] [name] Date of Service: 02/01/2021 2:00 PM Medical Record Number: [id number] Patient Account Number: [id number] Date of Birth/Sex: 01/20/1926 (88 y.o. F) Treating RN: Huel Coventry Primary Care Eniola Cerullo: Clydie Braun Other Clinician: Referring Fionn Stracke: Clydie Braun Treating Aicha Clingenpeel/Extender: Rowan Blase in Treatment: 0 Abuse Risk Screen Items Answer ABUSE RISK SCREEN: Has anyone close to you tried to hurt or harm you recentlyo No Do you feel uncomfortable with anyone in your familyo No Has anyone forced you do things that you didnot want to doo No Electronic Signature(s) Signed: 02/02/2021 5:12:14 PM By: Elliot Gurney, BSN, RN, CWS, Kim RN, BSN Entered By: Elliot Gurney, BSN, RN, CWS, Kim on 02/01/2021 13:39:20 [name] [name] ([id number]) -------------------------------------------------------------------------------- Activities of Daily Living Details Patient Name: [name] [name] Date of Service: 02/01/2021 2:00 PM Medical Record Number: [id number] Patient Account Number: [id number] Date of Birth/Sex: 1926/09/04 (88 y.o. F) Treating RN: Huel Coventry Primary Care Sarita Hakanson: Clydie Braun Other Clinician: Referring Nikia Levels: Clydie Braun Treating Quasim Doyon/Extender: Rowan Blase in Treatment: 0 Activities of Daily Living Items Answer Activities of Daily Living (Please select one for each item) Drive Automobile Not Able Take Medications Not Able Use Telephone Not Able Care for Appearance Not Able Use Toilet Not Able Mady Haagensen / Shower Not Able Dress Self Not Able Feed Self Not Able Walk Not Able Get In / Out Bed Not Able Housework Not Able Prepare Meals Not Able Handle Money Not Able Shop for Self Not Able Notes Son states they have a nurse that comes in daily to care for his mother. ([facility]) Electronic Signature(s) Signed: 02/02/2021 5:12:14 PM By:  Elliot Gurney, BSN, RN, CWS, Kim RN, BSN Entered By: Elliot Gurney, BSN, RN, CWS, Kim on 02/01/2021 13:41:13 [name] [name] ([id number]) -------------------------------------------------------------------------------- Education Screening Details Patient Name: [name] [name] Date of Service: 02/01/2021 2:00 PM Medical Record Number: [id number] Patient Account Number: [id number] Date of Birth/Sex: 07-31-1926 (88 y.o. F) Treating RN: Huel Coventry Primary Care Mickie Kozikowski: Clydie Braun Other Clinician: Referring Kayler Rise: Clydie Braun Treating Jalesia Loudenslager/Extender: Rowan Blase in Treatment: 0 Primary Learner Assessed: Patient Learning Preferences/Education Level/Primary Language Learning Preference: Explanation, Demonstration Highest Education Level: High School Preferred Language: English Cognitive Barrier Language Barrier: No Translator Needed: No Memory Deficit: Yes Emotional Barrier: No Cultural/Religious Beliefs Affecting Medical Care: No Physical Barrier Impaired Vision: No Impaired Hearing: No Decreased Hand dexterity: No Knowledge/Comprehension Knowledge Level: High Comprehension Level: High Ability to understand written instructions: High Ability to understand verbal instructions: High Motivation Anxiety Level: Calm Cooperation: Cooperative Education Importance: Acknowledges Need Interest in Health Problems: Asks Questions Perception: Coherent Willingness to Engage in Self-Management High Activities: Readiness to Engage in Self-Management High Activities: Electronic Signature(s) Signed: 02/02/2021 5:12:14 PM By: Elliot Gurney, BSN, RN, CWS, Kim RN, BSN Entered By: Elliot Gurney, BSN, RN, CWS, Kim on 02/01/2021 13:41:47 [name] [name] ([id number]) -------------------------------------------------------------------------------- Fall Risk Assessment Details Patient Name: [name] [name] Date of Service: 02/01/2021 2:00 PM Medical Record Number: [id number] Patient Account Number:  [id number] Date of Birth/Sex: December 21, 1926 (88 y.o. F) Treating RN: Huel Coventry Primary Care Jovany Disano: Clydie Braun Other Clinician: Referring Vannia Pola: Clydie Braun Treating Teri Diltz/Extender: Rowan Blase in Treatment: 0 Fall Risk Assessment Items Have you had 2 or more falls in the last 12 monthso 0 No Have you had any fall that resulted in injury in the last 12 monthso 0 No FALLS RISK SCREEN History of falling - immediate or within 3 months 0 No Secondary diagnosis (Do you have 2 or more medical  diagnoseso) 0 No Ambulatory aid None/bed rest/wheelchair/nurse 0 Yes Crutches/cane/walker 0 No Furniture 0 No Intravenous therapy Access/Saline/Heparin Lock 0 No Gait/Transferring Normal/ bed rest/ wheelchair 0 Yes Weak (short steps with or without shuffle, stooped but able to lift head while walking, may 0 No seek support from furniture) Impaired (short steps with shuffle, may have difficulty arising from chair, head down, impaired 0 No balance) Mental Status Oriented to own ability 0 No Electronic Signature(s) Signed: 02/02/2021 5:12:14 PM By: Elliot Gurney, BSN, RN, CWS, Kim RN, BSN Entered By: Elliot Gurney, BSN, RN, CWS, Kim on 02/01/2021 13:42:10 [name] [name] ([id number]) -------------------------------------------------------------------------------- Foot Assessment Details Patient Name: [name] [name] Date of Service: 02/01/2021 2:00 PM Medical Record Number: [id number] Patient Account Number: [id number] Date of Birth/Sex: 09-04-1926 (88 y.o. F) Treating RN: Huel Coventry Primary Care Myrian Botello: Clydie Braun Other Clinician: Referring Markiya Keefe: Clydie Braun Treating Tyla Burgner/Extender: Rowan Blase in Treatment: 0 Foot Assessment Items Site Locations + = Sensation present, - = Sensation absent, C = Callus, U = Ulcer R = Redness, W = Warmth, M = Maceration, PU = Pre-ulcerative lesion F = Fissure, S = Swelling, D = Dryness Assessment Right: Left: Other  Deformity: No No Prior Foot Ulcer: No No Prior Amputation: No No Charcot Joint: No No Ambulatory Status: Non-ambulatory Assistance Device: Ship broker) Signed: 02/02/2021 5:12:14 PM By: Elliot Gurney, BSN, RN, CWS, Kim RN, BSN Entered By: Elliot Gurney, BSN, RN, CWS, Kim on 02/01/2021 13:47:33 [name], [name] ([id number]) -------------------------------------------------------------------------------- Nutrition Risk Screening Details Patient Name: [name] [name] Date of Service: 02/01/2021 2:00 PM Medical Record Number: [id number] Patient Account Number: [id number] Date of Birth/Sex: 10-12-1926 (88 y.o. F) Treating RN: Huel Coventry Primary Care Jonnie Kubly: Clydie Braun Other Clinician: Referring Fran Mcree: Clydie Braun Treating Ailyn Gladd/Extender: Rowan Blase in Treatment: 0 Height (in): 71 Weight (lbs): 170 Body Mass Index (BMI): 23.7 Nutrition Risk Screening Items Score Screening NUTRITION RISK SCREEN: I have an illness or condition that made me change the kind and/or amount of food I eat 0 No I eat fewer than two meals per day 0 No I eat few fruits and vegetables, or milk products 0 No I have three or more drinks of beer, liquor or wine almost every day 0 No I have tooth or mouth problems that make it hard for me to eat 0 No I don't always have enough money to buy the food I need 0 No I eat alone most of the time 1 Yes I take three or more different prescribed or over-the-counter drugs a day 0 No Without wanting to, I have lost or gained 10 pounds in the last six months 0 No I am not always physically able to shop, cook and/or feed myself 0 No Nutrition Protocols Good Risk Protocol 0 No interventions needed Moderate Risk Protocol High Risk Proctocol Risk Level: Good Risk Score: 1 Electronic Signature(s) Signed: 02/02/2021 5:12:14 PM By: Elliot Gurney, BSN, RN, CWS, Kim RN, BSN Entered By: Elliot Gurney, BSN, RN, CWS, Kim on 02/01/2021 13:42:33

## 2021-02-02 NOTE — Progress Notes (Signed)
EVELEN, JANIAK ([id number]) Visit Report for 02/01/2021 Chief Complaint Document Details Patient Name: [name] [name], [name] [name] Date of Service: 02/01/2021 2:00 PM Medical Record Number: [id number] Patient Account Number: [id number] Date of Birth/Sex: Mar 16, 1926 (88 y.o. F) Treating RN: Cornell Barman Primary Care Provider: Adrian Prows Other Clinician: Referring Provider: Adrian Prows Treating Provider/Extender: Skipper Cliche in Treatment: 0 Information Obtained from: Patient Chief Complaint Left heel pressure ulcer Electronic Signature(s) Signed: 02/01/2021 2:16:16 PM By: Worthy Keeler PA-C Entered By: Worthy Keeler on 02/01/2021 14:16:16 [place], Mitchell ([id number]) -------------------------------------------------------------------------------- Debridement Details Patient Name: [name] [name] Date of Service: 02/01/2021 2:00 PM Medical Record Number: [id number] Patient Account Number: [id number] Date of Birth/Sex: 05-Aug-1926 (88 y.o. F) Treating RN: Cornell Barman Primary Care Provider: Adrian Prows Other Clinician: Referring Provider: Adrian Prows Treating Provider/Extender: Skipper Cliche in Treatment: 0 Debridement Performed for Wound #2 Left,Medial Calcaneus Assessment: Performed By: Physician Tommie Sams., PA-C Debridement Type: Chemical/Enzymatic/Mechanical Agent Used: saline and gauze Level of Consciousness (Pre- Awake and Alert procedure): Pre-procedure Verification/Time Out Yes - 14:20 Taken: Instrument: Other : saline and gauze Bleeding: Minimum Hemostasis Achieved: Pressure Response to Treatment: Procedure was tolerated well Level of Consciousness (Post- Awake and Alert procedure): Post Debridement Measurements of Total Wound Length: (cm) 2.2 Stage: Category/Stage III Width: (cm) 2 Depth: (cm) 0.3 Volume: (cm) 1.037 Character of Wound/Ulcer Post Debridement: Stable Post Procedure Diagnosis Same as Pre-procedure Electronic  Signature(s) Signed: 02/01/2021 4:34:41 PM By: Worthy Keeler PA-C Signed: 02/02/2021 5:12:14 PM By: Gretta Cool, BSN, RN, CWS, Kim RN, BSN Entered By: Gretta Cool, BSN, RN, CWS, Kim on 02/01/2021 14:30:44 [name] [name] ([id number]) -------------------------------------------------------------------------------- HPI Details Patient Name: [name] [name] Date of Service: 02/01/2021 2:00 PM Medical Record Number: [id number] Patient Account Number: [id number] Date of Birth/Sex: September 04, 1926 (88 y.o. F) Treating RN: Cornell Barman Primary Care Provider: Adrian Prows Other Clinician: Referring Provider: Adrian Prows Treating Provider/Extender: Skipper Cliche in Treatment: 0 History of Present Illness HPI Description: 06/01/17-She is here for initial evaluation of left lower extremity ulcers. She states these have been present for approximately 6-7 months; she is unclear exactly who she has followed with, states [facility], people in [place]. She reports a history of lymphedema pumps with extremely sporadic use over the last 2 years, none recently. She has been treated with Unna boots in the left lower extremity, no compression to the right lower extremity. She states her lower extremity edema is at/near baseline, I have low suspicion for DVT prior to aggressively compressing and initiating lymphdema pumps twice daily; she denies IVC filter. I see no recent ultrasound in Epic; last DVT study was to the left in February. She denies pain, low suspicion of cellulitis. 06/15/17-She is here in follow-up evaluation for bilateral lower extremity edema and left lower extremity ulcers. She was a no call/no show at last week's appointment so we have been unable to confirm her home health agency. She did go for the ordered venous ultrasound 2 weeks ago which was negative for DVT bilaterally. Her family member confirms that she was discharged from [facility] and that they ordered home health at  discharge; we will reach out to them. Despite this home health has been applying unna boots to the left leg. Readmission: 02/01/2021 upon evaluation today patient appears for reevaluation in the clinic although symptoms have been since 2019 last saw her. Subsequently the issue today is actually a wound on the medial aspect of her left heel. Fortunately I do not see any signs of active infection  at this time which is great news. No fevers, chills, nausea, vomiting, or diarrhea. Of note the patient does have a history of hypertension, congestive heart failure, and peripheral vascular disease. She is on a blood thinner, Eliquis. Although her son and the patient both were stating she was not taking anything this was picked up on the ninth of this month according to the records from the pharmacy. This was along with the Augmentin which apparently she is also taking according to the son. Either way I am concerned about the fact that they do not really know exactly what she is taking and how often and I want them to go home and check on this as soon as they leave here today. Electronic Signature(s) Signed: 02/01/2021 3:59:25 PM By: Worthy Keeler PA-C Previous Signature: 02/01/2021 2:45:13 PM Version By: Worthy Keeler PA-C Previous Signature: 02/01/2021 2:44:15 PM Version By: Worthy Keeler PA-C Entered By: Worthy Keeler on 02/01/2021 15:59:25 Solow, Jeralynn ([id number]) -------------------------------------------------------------------------------- Physical Exam Details Patient Name: [name] [name] Date of Service: 02/01/2021 2:00 PM Medical Record Number: [id number] Patient Account Number: [id number] Date of Birth/Sex: 06-Feb-1926 (88 y.o. F) Treating RN: Cornell Barman Primary Care Provider: Adrian Prows Other Clinician: Referring Provider: Adrian Prows Treating Provider/Extender: Skipper Cliche in Treatment: 0 Constitutional patient is hypertensive.. pulse regular and within  target range for patient.Marland Kitchen respirations regular, non-labored and within target range for patient.Marland Kitchen temperature within target range for patient.. Well-nourished and well-hydrated in no acute distress. Eyes conjunctiva clear no eyelid edema noted. pupils equal round and reactive to light and accommodation. Ears, Nose, Mouth, and Throat no gross abnormality of ear auricles or external auditory canals. normal hearing noted during conversation. mucus membranes moist. Respiratory normal breathing without difficulty. Cardiovascular 1+ dorsalis pedis/posterior tibialis pulses. 1+ pitting edema of the bilateral lower extremities. Musculoskeletal Patient unable to walk without assistance. Psychiatric this patient is able to make decisions and demonstrates good insight into disease process. Alert and Oriented x 3. pleasant and cooperative. Notes Upon inspection patient's wound on her foot actually appears to be doing okay, but hyper granulated but fortunately I do not see any signs of infection and overall I think that the patient is making good progress in general. No fevers, chills, nausea, vomiting, or diarrhea. Still I am concerned about her arterial flow I am uncertain if she is really getting sufficient flow to heal this appropriately or not for that reason I am to send her for an arterial study with ABI and TBI. Electronic Signature(s) Signed: 02/01/2021 4:00:36 PM By: Worthy Keeler PA-C Previous Signature: 02/01/2021 2:45:55 PM Version By: Worthy Keeler PA-C Previous Signature: 02/01/2021 2:44:31 PM Version By: Worthy Keeler PA-C Entered By: Worthy Keeler on 02/01/2021 16:00:36 Rowles, Berlynn ([id number]) -------------------------------------------------------------------------------- Physician Orders Details Patient Name: [name] [name] Date of Service: 02/01/2021 2:00 PM Medical Record Number: [id number] Patient Account Number: [id number] Date of Birth/Sex: 11-15-1926 (88 y.o.  F) Treating RN: Cornell Barman Primary Care Provider: Adrian Prows Other Clinician: Referring Provider: Adrian Prows Treating Provider/Extender: Skipper Cliche in Treatment: 0 Verbal / Phone Orders: No Diagnosis Coding ICD-10 Coding Code Description [phone] Pressure ulcer of left heel, stage 3 I73.89 Other specified peripheral vascular diseases I10 Essential (primary) hypertension I50.42 Chronic combined systolic (congestive) and diastolic (congestive) heart failure Follow-up Appointments Wound #2 Left,Medial Calcaneus o Return Appointment in 1 week. Bathing/ Shower/ Hygiene o May shower; gently cleanse wound with antibacterial soap, rinse and pat dry prior to dressing wounds Edema  Control - Lymphedema / Segmental Compressive Device / Other o Elevate legs to the level of the heart and pump ankles as often as possible o Elevate leg(s) parallel to the floor when sitting. Wound Treatment Wound #2 - Calcaneus Wound Laterality: Left, Medial Cleanser: Wound Cleanser (DME) (Generic) 3 x Per Week/30 Days Discharge Instructions: Wash your hands with soap and water. Remove old dressing, discard into plastic bag and place into trash. Cleanse the wound with Wound Cleanser prior to applying a clean dressing using gauze sponges, not tissues or cotton balls. Do not scrub or use excessive force. Pat dry using gauze sponges, not tissue or cotton balls. Primary Dressing: Hydrofera Blue Ready Transfer Foam, 2.5x2.5 (in/in) (DME) (Generic) 3 x Per Week/30 Days Discharge Instructions: Apply Hydrofera Blue Ready to wound bed as directed Secondary Dressing: Kerlix 4.5 x 4.1 (in/yd) (DME) (Generic) 3 x Per Week/30 Days Discharge Instructions: Apply Kerlix 4.5 x 4.1 (in/yd) as instructed Secured With: 66M Medipore H Soft Cloth Surgical Tape, 2x2 (in/yd) (DME) (Generic) 3 x Per Week/30 Days Services and Therapies o Ankle Brachial Index (ABI)-Bilateral - ABI/TBI Electronic  Signature(s) Signed: 02/01/2021 4:34:41 PM By: Worthy Keeler PA-C Signed: 02/02/2021 5:12:14 PM By: Gretta Cool, BSN, RN, CWS, Kim RN, BSN Entered By: Gretta Cool, BSN, RN, CWS, Kim on 02/01/2021 14:33:23 [name] [name] ([id number]) -------------------------------------------------------------------------------- Problem List Details Patient Name: [name] [name] Date of Service: 02/01/2021 2:00 PM Medical Record Number: [id number] Patient Account Number: [id number] Date of Birth/Sex: 05/11/1926 (88 y.o. F) Treating RN: Cornell Barman Primary Care Provider: Adrian Prows Other Clinician: Referring Provider: Adrian Prows Treating Provider/Extender: Skipper Cliche in Treatment: 0 Active Problems ICD-10 Encounter Code Description Active Date MDM Diagnosis [phone] Pressure ulcer of left heel, stage 3 02/01/2021 No Yes I73.89 Other specified peripheral vascular diseases 02/01/2021 No Yes I10 Essential (primary) hypertension 02/01/2021 No Yes I50.42 Chronic combined systolic (congestive) and diastolic (congestive) heart 02/01/2021 No Yes failure Inactive Problems Resolved Problems Electronic Signature(s) Signed: 02/01/2021 2:16:01 PM By: Worthy Keeler PA-C Entered By: Worthy Keeler on 02/01/2021 [place], [place] ([id number]) -------------------------------------------------------------------------------- Progress Note Details Patient Name: [name] [name] Date of Service: 02/01/2021 2:00 PM Medical Record Number: [id number] Patient Account Number: [id number] Date of Birth/Sex: 23-May-1926 (88 y.o. F) Treating RN: Cornell Barman Primary Care Provider: Adrian Prows Other Clinician: Referring Provider: Adrian Prows Treating Provider/Extender: Skipper Cliche in Treatment: 0 Subjective Chief Complaint Information obtained from Patient Left heel pressure ulcer History of Present Illness (HPI) 06/01/17-She is here for initial evaluation of left lower extremity ulcers. She  states these have been present for approximately 6-7 months; she is unclear exactly who she has followed with, states [facility], people in [place]. She reports a history of lymphedema pumps with extremely sporadic use over the last 2 years, none recently. She has been treated with Unna boots in the left lower extremity, no compression to the right lower extremity. She states her lower extremity edema is at/near baseline, I have low suspicion for DVT prior to aggressively compressing and initiating lymphdema pumps twice daily; she denies IVC filter. I see no recent ultrasound in Epic; last DVT study was to the left in February. She denies pain, low suspicion of cellulitis. 06/15/17-She is here in follow-up evaluation for bilateral lower extremity edema and left lower extremity ulcers. She was a no call/no show at last week's appointment so we have been unable to confirm her home health agency. She did go for the ordered venous ultrasound 2 weeks ago which was negative  for DVT bilaterally. Her family member confirms that she was discharged from [facility] and that they ordered home health at discharge; we will reach out to them. Despite this home health has been applying unna boots to the left leg. Readmission: 02/01/2021 upon evaluation today patient appears for reevaluation in the clinic although symptoms have been since 2019 last saw her. Subsequently the issue today is actually a wound on the medial aspect of her left heel. Fortunately I do not see any signs of active infection at this time which is great news. No fevers, chills, nausea, vomiting, or diarrhea. Of note the patient does have a history of hypertension, congestive heart failure, and peripheral vascular disease. She is on a blood thinner, Eliquis. Although her son and the patient both were stating she was not taking anything this was picked up on the ninth of this month according to the records from the pharmacy. This  was along with the Augmentin which apparently she is also taking according to the son. Either way I am concerned about the fact that they do not really know exactly what she is taking and how often and I want them to go home and check on this as soon as they leave here today. Patient History Information obtained from Patient. Allergies No Known Allergies Family History Diabetes - Father, No family history of Cancer, Heart Disease, Hereditary Spherocytosis, Hypertension, Kidney Disease, Lung Disease, Seizures, Stroke, Thyroid Problems, Tuberculosis. Social History Never smoker, Marital Status - Widowed, Alcohol Use - Never, Drug Use - No History, Caffeine Use - Moderate. Medical History Eyes Patient has history of Cataracts Denies history of Glaucoma, Optic Neuritis Ear/Nose/Mouth/Throat Denies history of Chronic sinus problems/congestion, Middle ear problems Hematologic/Lymphatic Patient has history of Lymphedema Denies history of Anemia, Hemophilia, Human Immunodeficiency Virus, Sickle Cell Disease Respiratory Denies history of Aspiration, Asthma, Chronic Obstructive Pulmonary Disease (COPD), Pneumothorax, Sleep Apnea, Tuberculosis Cardiovascular Patient has history of Hypertension, Peripheral Venous Disease Denies history of Angina, Arrhythmia, Congestive Heart Failure, Coronary Artery Disease, Deep Vein Thrombosis, Hypotension, Myocardial Infarction, Peripheral Arterial Disease, Phlebitis, Vasculitis Gastrointestinal Denies history of Cirrhosis , Colitis, Crohn s, Hepatitis A, Hepatitis B, Hepatitis C Endocrine Denies history of Type I Diabetes, Type II Diabetes Genitourinary Denies history of End Stage Renal Disease Supinski, Shaunice ([id number]) Immunological Denies history of Lupus Erythematosus, Raynaud s, Scleroderma Integumentary (Skin) Denies history of History of Burn, History of pressure wounds Musculoskeletal Patient has history of Osteoarthritis Denies history of  Gout, Rheumatoid Arthritis, Osteomyelitis Neurologic Denies history of Dementia, Neuropathy, Quadriplegia, Paraplegia, Seizure Disorder Oncologic Denies history of Received Chemotherapy, Received Radiation Psychiatric Denies history of Anorexia/bulimia, Confinement Anxiety Objective Constitutional patient is hypertensive.. pulse regular and within target range for patient.Marland Kitchen respirations regular, non-labored and within target range for patient.Marland Kitchen temperature within target range for patient.. Well-nourished and well-hydrated in no acute distress. Vitals Time Taken: 1:37 PM, Height: 71 in, Weight: 170 lbs, BMI: 23.7, Temperature: 97.6 F, Pulse: 64 bpm, Respiratory Rate: 16 breaths/min, Blood Pressure: 192/79 mmHg. Eyes conjunctiva clear no eyelid edema noted. pupils equal round and reactive to light and accommodation. Ears, Nose, Mouth, and Throat no gross abnormality of ear auricles or external auditory canals. normal hearing noted during conversation. mucus membranes moist. Respiratory normal breathing without difficulty. Cardiovascular 1+ dorsalis pedis/posterior tibialis pulses. 1+ pitting edema of the bilateral lower extremities. Musculoskeletal Patient unable to walk without assistance. Psychiatric this patient is able to make decisions and demonstrates good insight into disease process. Alert and Oriented x  3. pleasant and cooperative. General Notes: Upon inspection patient's wound on her foot actually appears to be doing okay, but hyper granulated but fortunately I do not see any signs of infection and overall I think that the patient is making good progress in general. No fevers, chills, nausea, vomiting, or diarrhea. Still I am concerned about her arterial flow I am uncertain if she is really getting sufficient flow to heal this appropriately or not for that reason I am to send her for an arterial study with ABI and TBI. Integumentary (Hair, Skin) Wound #2 status is Open.  Original cause of wound was Pressure Injury. The date acquired was: 01/01/2021. The wound is located on the Left,Medial Calcaneus. The wound measures 2.2cm length x 2cm width x 0.3cm depth; 3.456cm^2 area and 1.037cm^3 volume. There is Fat Layer (Subcutaneous Tissue) exposed. There is no tunneling or undermining noted. There is a medium amount of serous drainage noted. The wound margin is flat and intact. There is large (67-100%) red, hyper - granulation within the wound bed. There is a small (1-33%) amount of necrotic tissue within the wound bed including Adherent Slough. Assessment Active Problems ICD-10 Pressure ulcer of left heel, stage 3 Other specified peripheral vascular diseases Essential (primary) hypertension Chronic combined systolic (congestive) and diastolic (congestive) heart failure Henricksen, Alphonsine ([id number]) Procedures Wound #2 Pre-procedure diagnosis of Wound #2 is a Pressure Ulcer located on the Left,Medial Calcaneus . There was a Chemical/Enzymatic/Mechanical debridement performed by Tommie Sams., PA-C. With the following instrument(s): saline and gauze. Other agent used was saline and gauze. A time out was conducted at 14:20, prior to the start of the procedure. A Minimum amount of bleeding was controlled with Pressure. The procedure was tolerated well. Post Debridement Measurements: 2.2cm length x 2cm width x 0.3cm depth; 1.037cm^3 volume. Post debridement Stage noted as Category/Stage III. Character of Wound/Ulcer Post Debridement is stable. Post procedure Diagnosis Wound #2: Same as Pre-Procedure Plan Follow-up Appointments: Wound #2 Left,Medial Calcaneus: Return Appointment in 1 week. Bathing/ Shower/ Hygiene: May shower; gently cleanse wound with antibacterial soap, rinse and pat dry prior to dressing wounds Edema Control - Lymphedema / Segmental Compressive Device / Other: Elevate legs to the level of the heart and pump ankles as often as possible Elevate  leg(s) parallel to the floor when sitting. Services and Therapies ordered were: Ankle Brachial Index (ABI)-Bilateral - ABI/TBI WOUND #2: - Calcaneus Wound Laterality: Left, Medial Cleanser: Wound Cleanser (DME) (Generic) 3 x Per Week/30 Days Discharge Instructions: Wash your hands with soap and water. Remove old dressing, discard into plastic bag and place into trash. Cleanse the wound with Wound Cleanser prior to applying a clean dressing using gauze sponges, not tissues or cotton balls. Do not scrub or use excessive force. Pat dry using gauze sponges, not tissue or cotton balls. Primary Dressing: Hydrofera Blue Ready Transfer Foam, 2.5x2.5 (in/in) (DME) (Generic) 3 x Per Week/30 Days Discharge Instructions: Apply Hydrofera Blue Ready to wound bed as directed Secondary Dressing: Kerlix 4.5 x 4.1 (in/yd) (DME) (Generic) 3 x Per Week/30 Days Discharge Instructions: Apply Kerlix 4.5 x 4.1 (in/yd) as instructed Secured With: 71M Medipore H Soft Cloth Surgical Tape, 2x2 (in/yd) (DME) (Generic) 3 x Per Week/30 Days 1. I would recommend that we initiate treatment with [facility] which I think is can be the best treatment option going forward initially. Patient is in agreement with that plan. 2. I am also can recommend that we have the patient use a ABD pad and roll  gauze to secure in place which will help with Any Excessive Drainage. 3. I Am Also Can Recommend That We Send the Patient for Arterial Study with ABI and TBI I Think This Needs to Be Done to Ensure That She Has Appropriate Blood Flow before Proceeding with Any Debridement or Even Compression Therapy for That Matter. We will see patient back for reevaluation in 1 week here in the clinic. If anything worsens or changes patient will contact our office for additional recommendations. Electronic Signature(s) Signed: 02/01/2021 4:01:17 PM By: Worthy Keeler PA-C Entered By: Worthy Keeler on 02/01/2021 16:01:17 Erven, Aariana  ([id number]) -------------------------------------------------------------------------------- ROS/PFSH Details Patient Name: [name] [name] Date of Service: 02/01/2021 2:00 PM Medical Record Number: [id number] Patient Account Number: [id number] Date of Birth/Sex: [birth year] (88 y.o. F) Treating RN: Cornell Barman Primary Care Provider: Adrian Prows Other Clinician: Referring Provider: Adrian Prows Treating Provider/Extender: Skipper Cliche in Treatment: 0 Information Obtained From Patient Eyes Medical History: Positive for: Cataracts Negative for: Glaucoma; Optic Neuritis Ear/Nose/Mouth/Throat Medical History: Negative for: Chronic sinus problems/congestion; Middle ear problems Hematologic/Lymphatic Medical History: Positive for: Lymphedema Negative for: Anemia; Hemophilia; Human Immunodeficiency Virus; Sickle Cell Disease Respiratory Medical History: Negative for: Aspiration; Asthma; Chronic Obstructive Pulmonary Disease (COPD); Pneumothorax; Sleep Apnea; Tuberculosis Cardiovascular Medical History: Positive for: Hypertension; Peripheral Venous Disease Negative for: Angina; Arrhythmia; Congestive Heart Failure; Coronary Artery Disease; Deep Vein Thrombosis; Hypotension; Myocardial Infarction; Peripheral Arterial Disease; Phlebitis; Vasculitis Gastrointestinal Medical History: Negative for: Cirrhosis ; Colitis; Crohnos; Hepatitis A; Hepatitis B; Hepatitis C Endocrine Medical History: Negative for: Type I Diabetes; Type II Diabetes Genitourinary Medical History: Negative for: End Stage Renal Disease Immunological Medical History: Negative for: Lupus Erythematosus; Raynaudos; Scleroderma Integumentary (Skin) Medical History: Negative for: History of Burn; History of pressure wounds Musculoskeletal Prom, Tyrihanna ([id number]) Medical History: Positive for: Osteoarthritis Negative for: Gout; Rheumatoid Arthritis; Osteomyelitis Neurologic Medical  History: Negative for: Dementia; Neuropathy; Quadriplegia; Paraplegia; Seizure Disorder Oncologic Medical History: Negative for: Received Chemotherapy; Received Radiation Psychiatric Medical History: Negative for: Anorexia/bulimia; Confinement Anxiety HBO Extended History Items Eyes: Cataracts Immunizations Pneumococcal Vaccine: Received Pneumococcal Vaccination: Yes Received Pneumococcal Vaccination On or After 60th Birthday: Yes Implantable Devices No devices added Family and Social History Cancer: No; Diabetes: Yes - Father; Heart Disease: No; Hereditary Spherocytosis: No; Hypertension: No; Kidney Disease: No; Lung Disease: No; Seizures: No; Stroke: No; Thyroid Problems: No; Tuberculosis: No; Never smoker; Marital Status - Widowed; Alcohol Use: Never; Drug Use: No History; Caffeine Use: Moderate; Financial Concerns: No; Food, Clothing or Shelter Needs: No; Support System Lacking: No; Transportation Concerns: No Electronic Signature(s) Signed: 02/01/2021 4:34:41 PM By: Worthy Keeler PA-C Signed: 02/02/2021 5:12:14 PM By: Gretta Cool, BSN, RN, CWS, Kim RN, BSN Entered By: Gretta Cool, BSN, RN, CWS, Kim on 02/01/2021 13:39:09 [name] [name] ([id number]) -------------------------------------------------------------------------------- SuperBill Details Patient Name: [name] [name] Date of Service: 02/01/2021 Medical Record Number: [id number] Patient Account Number: [id number] Date of Birth/Sex: 1926-01-17 (88 y.o. F) Treating RN: Cornell Barman Primary Care Provider: Adrian Prows Other Clinician: Referring Provider: Adrian Prows Treating Provider/Extender: Skipper Cliche in Treatment: 0 Diagnosis Coding ICD-10 Codes Code Description [phone] Pressure ulcer of left heel, stage 3 I73.89 Other specified peripheral vascular diseases I10 Essential (primary) hypertension I50.42 Chronic combined systolic (congestive) and diastolic (congestive) heart failure Facility Procedures CPT4  Code: [id number] Description: 99213 - WOUND CARE VISIT-LEV 3 EST PT Modifier: Quantity: 1 Physician Procedures CPT4 Code: [id number] Description: [id number] - WC PHYS LEVEL 4 - NEW PT Modifier: Quantity: 1 CPT4 Code: Description: ICD-10 Diagnosis Description  [id number] Pressure ulcer of left heel, stage 3 I73.89 Other specified peripheral vascular diseases I10 Essential (primary) hypertension I50.42 Chronic combined systolic (congestive) and diastolic (congestive) heart Modifier: failure Quantity: Electronic Signature(s) Signed: 02/01/2021 4:01:41 PM By: Worthy Keeler PA-C Previous Signature: 02/01/2021 2:55:12 PM Version By: Gretta Cool, BSN, RN, CWS, Kim RN, BSN Entered By: Worthy Keeler on 02/01/2021 16:01:40

## 2021-02-03 ENCOUNTER — Encounter: Payer: Self-pay | Admitting: Nurse Practitioner

## 2021-02-03 ENCOUNTER — Other Ambulatory Visit (INDEPENDENT_AMBULATORY_CARE_PROVIDER_SITE_OTHER): Payer: Self-pay | Admitting: Physician Assistant

## 2021-02-03 ENCOUNTER — Other Ambulatory Visit: Payer: Medicaid Other | Admitting: Nurse Practitioner

## 2021-02-03 ENCOUNTER — Other Ambulatory Visit: Payer: Self-pay

## 2021-02-03 DIAGNOSIS — R531 Weakness: Secondary | ICD-10-CM

## 2021-02-03 DIAGNOSIS — R0602 Shortness of breath: Secondary | ICD-10-CM

## 2021-02-03 DIAGNOSIS — R5381 Other malaise: Secondary | ICD-10-CM

## 2021-02-03 DIAGNOSIS — L97429 Non-pressure chronic ulcer of left heel and midfoot with unspecified severity: Secondary | ICD-10-CM

## 2021-02-03 DIAGNOSIS — I5032 Chronic diastolic (congestive) heart failure: Secondary | ICD-10-CM

## 2021-02-03 NOTE — Progress Notes (Signed)
[facility] Palliative Care Consult Note Telephone: [phone]  Fax: [phone]   Date of encounter: 02/03/21 4:11 PM PATIENT NAME: [name] [name] [address] [id number]   [phone] (home)  DOB: 1926/10/15 MRN: [id number] PRIMARY CARE PROVIDER:    Leonel Ramsay, MD,  [place] [address] [phone]  REFERRING PROVIDER:   Leonel Ramsay, MD [place],   [address] [phone]  RESPONSIBLE PARTY:    Contact Information     Name Relation Home Work Mobile   [name] [name] [phone]     [name] [name] [phone]  [phone]      I met face to face with patient and family in home. Palliative Care was asked to follow this patient by consultation request of  [name] Ramsay, MD to address advance care planning and complex medical decision making. This is the initial visit.                   ASSESSMENT AND PLAN / RECOMMENDATIONS:  Advance Care Planning/Goals of Care: Goals include to maximize quality of life and symptom management. Patient/health care surrogate gave his/her permission to discuss.Our advance care planning conversation included a discussion about:    The value and importance of advance care planning  Experiences with loved ones who have been seriously ill or have died  Exploration of personal, cultural or spiritual beliefs that might influence medical decisions  Exploration of goals of care in the event of a sudden injury or illness  Identification  of a healthcare agent  Review and updating or creation of an  advance directive document . Decision not to resuscitate or to de-escalate disease focused treatments due to poor prognosis. CODE STATUS: DNR  Symptom Management/Plan: 1. Advance Care Planning; Discussed code status, [name] [name] wishes are for [name] [name] to be a DNR, goldenrod form completed, placed in [place]. Discussed at length  about goc, option of Hospice services under Medicare program, [name] [name] in agreement to have Hospice Physicians to review case for eligibility.   2. Shortness of breath with BLE edema secondary CHF, CKD, obesity to continue to elevate BLE, monitor respiratory, symptom management  3. Generalized weakness with debility secondary CHF, CKD to ; bed-bound; continue turning, positioning, comfort care focus  4. Goals of Care: Goals include to maximize quality of life and symptom management. Our advance care planning conversation included a discussion about:    The value and importance of advance care planning  Exploration of personal, cultural or spiritual beliefs that might influence medical decisions  Exploration of goals of care in the event of a sudden injury or illness  Identification and preparation of a healthcare agent  Review and updating or creation of an advance directive document.  5. Palliative care encounter; Palliative care encounter; Palliative medicine team will continue to support patient, patient's family, and medical team. Visit consisted of counseling and education dealing with the complex and emotionally intense issues of symptom management and palliative care in the setting of serious and potentially life-threatening illness I spent 77 minutes providing this consultation. More than 50% of the time in this consultation was spent in counseling and care coordination. PPS: 30%  HOSPICE ELIGIBILITY/DIAGNOSIS: will have Hospice Physicians to review case  Chief Complaint: Initial palliative consult for complex medical decision making  HISTORY OF PRESENT ILLNESS:  [name] [name] is a 88 y.o. year old female  with multiple medical problems including dementia, CKD, diastolic CHF, lymphedema, HTN,  obesity, anemia, venous status, h/o DVT.   Hospitalized 10/04/2020 to 10/19/2020 severe weakness, decrease responsiveness, covid positive with pna, UTI, acute metabolic encephalopathy in the setting  of dementia, hyponatremia.  Hospitalized 11/07/2020 to 11/11/2020 for poor oral intake, weakness, wounds, AKI  Hospitalized 12/12/2020 to 12/16/2020 for left LE DVT; diastolic CHF, wound  I called [name] [name], [name] [name]'s son to confirm PC visit with covid negative. I visited [name] [name] and [name] [name] in their home. I talked to [name] [name] prior to visiting with [name] [name]. We talked about the last time [name] [name] was independent about 2 years ago, past medical history, functional decline in chronic disease progression of dementia, ckd, chf, weakness, BLE edema, poor appetite with significant weight loss. We talked about functional decline being bed-bound, total care, caregivers who come into the home to help. [name] [name] does stay and care for [name] [name]. WE talked about family dynamics, [name]. Corvin is widowed of 10 years, [name] [name] is an only child. We talked about daily routine. [name] [name] endorses [name] [name] sleeps most of the night, naps during the day. We talked cognitive decline with hallucinations, seeing people who have passed, talking to them. We talked about appetite, decrease appetite, weight loss, decrease in clothes size. We talked about medical goals with wishes to focus on comfort. We talked about difficulty getting to MD appointments as she is bed-bound. [name] [name] endorses he was not able to get transportation to take [name] [name] to last wound care appointment. We talked about wound with difficulty with wound healing. We talked about code status with wishes to be DNR, golden rod completed, will put in vynca. I visited and observed [name] [name], she was lying in her bed in her room. [name] [name] made eye contact, ros, talked about symptoms. [name] [name] was confused about events, pleasant, cooperative with assessment. [name] [name] tanked provider for visiting. [name] [name] and I talked about Hospice services under Medicare benefit. [name] [name] in agreement to have Hospice physicians review case. We talked about role pc in poc. Therapeutic  listening, emotional support provided. Questions answered.   History obtained from review of EMR, discussion with [name] [name], [name] [name] son with [name] [name].  I reviewed available labs, medications, imaging, studies and related documents from the EMR.  Records reviewed and summarized above.   ROS 10 point system reviewed with [name] Side, [name] [name] son and [name] [name] all negative except HPI  Physical Exam: Constitutional: NAD General: frail appearing, debilitated, chronically ill pleasant female EYES:  lids intact ENMT: oral mucous membranes moist CV: S1S2, RRR, +BLE edema Pulmonary: fine crackles, decrease bases, no increased work of breathing, no cough, room air Abdomen:  normo-active BS + 4 quadrants, soft and non tender MSK: bed-bound; functional quadriplegic Skin: warm and dry Neuro:  + generalized weakness,  + cognitive impairment Psych:+flat affect, A and O x 2 CURRENT PROBLEM LIST:  Patient Active Problem List   Diagnosis Date Noted   DVT (deep venous thrombosis) ([facility]) 12/12/2020   HTN (hypertension) 12/12/2020   Blister of left foot 12/12/2020   Deep vein thrombosis (DVT) of left lower extremity ([facility]) 12/12/2020   Leukocytosis    Malnutrition of moderate degree 11/09/2020   Pressure injury of skin 11/08/2020   Dementia ([facility]) 11/07/2020   Lactic acidosis 11/07/2020   Superficial bruising of arm, left, initial encounter    Chronic diastolic CHF (congestive heart failure) ([facility]) 04/06/2020   HTN (hypertension), malignant    Lymphedema    Fall    Stage 3b chronic kidney disease ([facility])  B12 deficiency    Anemia due to vitamin B12 deficiency    Acute metabolic encephalopathy    General weakness    Confusion    Acute UTI 01/07/2020   E. coli UTI 01/07/2020   Chronic venous stasis dermatitis of both lower extremities 01/07/2020   Cellulitis 03/06/2017   Left leg cellulitis 11/25/2016   AKI (acute kidney injury) ([facility]) 11/05/2016   Decubitus ulcer of ischium, stage 2, left  ([facility]) 11/05/2016   Troponin I above reference range 11/04/2016   PAST MEDICAL HISTORY:  Active Ambulatory Problems    Diagnosis Date Noted   Troponin I above reference range 11/04/2016   AKI (acute kidney injury) ([facility]) 11/05/2016   Decubitus ulcer of ischium, stage 2, left ([facility]) 11/05/2016   Left leg cellulitis 11/25/2016   Cellulitis 03/06/2017   Acute UTI 01/07/2020   E. coli UTI 01/07/2020   Chronic venous stasis dermatitis of both lower extremities 01/07/2020   General weakness    Confusion    Acute metabolic encephalopathy    Anemia due to vitamin B12 deficiency    B12 deficiency    Stage 3b chronic kidney disease ([facility])    Chronic diastolic CHF (congestive heart failure) ([facility]) 04/06/2020   HTN (hypertension), malignant    Lymphedema    Fall    Superficial bruising of arm, left, initial encounter    Dementia ([facility]) 11/07/2020   Lactic acidosis 11/07/2020   Pressure injury of skin 11/08/2020   Malnutrition of moderate degree 11/09/2020   Leukocytosis    DVT (deep venous thrombosis) ([facility]) 12/12/2020   HTN (hypertension) 12/12/2020   Blister of left foot 12/12/2020   Deep vein thrombosis (DVT) of left lower extremity ([facility]) 12/12/2020   Resolved Ambulatory Problems    Diagnosis Date Noted   Pneumonia due to COVID-19 virus 10/05/2020   No Additional Past Medical History   SOCIAL HX:  Social History   Tobacco Use   Smoking status: Never   Smokeless tobacco: Never  Substance Use Topics   Alcohol use: No   FAMILY HX:  Family History  Problem Relation Age of Onset   Diabetes Mellitus II Mother    Heart disease Father       ALLERGIES: No Known Allergies   PERTINENT MEDICATIONS:  Outpatient Encounter Medications as of 02/03/2021  Medication Sig   acetaminophen (TYLENOL) 500 MG tablet Take 1 tablet (500 mg total) by mouth every 6 (six) hours as needed for mild pain or headache (fever >/= 101).   apixaban (ELIQUIS) 5 MG TABS tablet Take 2 tablets (10 mg total) by  mouth 2 (two) times daily for 5 days, THEN 1 tablet (5 mg total) 2 (two) times daily.   feeding supplement (ENSURE ENLIVE / ENSURE PLUS) LIQD Take 237 mLs by mouth 3 (three) times daily between meals.   polyethylene glycol (MIRALAX / GLYCOLAX) 17 g packet Take 17 g by mouth 2 (two) times daily.   No facility-administered encounter medications on file as of 02/03/2021.   Thank you for the opportunity to participate in the care of [name] [name].  The palliative care team will continue to follow. Please call our office at [phone] if we can be of additional assistance.   This chart was dictated using voice recognition software.  Despite best efforts to proofread,  errors can occur which can change the documentation meaning.   Questions and concerns were addressed. The patient/family was encouraged to call with questions and/or concerns. My business card was provided. Provided general support  and encouragement, no other unmet needs identified   Denver Harder Z Gracin Mcpartland, NP ,   COVID-19 PATIENT SCREENING TOOL Asked and negative response unless otherwise noted:  Have you had symptoms of covid, tested positive or been in contact with someone with symptoms/positive test in the past 5-10 days? NO

## 2021-02-09 ENCOUNTER — Ambulatory Visit: Payer: Medicare HMO | Admitting: Physician Assistant

## 2021-02-15 ENCOUNTER — Encounter: Payer: Medicare Other | Attending: Physician Assistant | Admitting: Physician Assistant

## 2021-02-15 ENCOUNTER — Other Ambulatory Visit: Payer: Self-pay

## 2021-02-15 DIAGNOSIS — I7389 Other specified peripheral vascular diseases: Secondary | ICD-10-CM | POA: Insufficient documentation

## 2021-02-15 DIAGNOSIS — I5042 Chronic combined systolic (congestive) and diastolic (congestive) heart failure: Secondary | ICD-10-CM | POA: Diagnosis not present

## 2021-02-15 DIAGNOSIS — I110 Hypertensive heart disease with heart failure: Secondary | ICD-10-CM | POA: Diagnosis not present

## 2021-02-15 DIAGNOSIS — L89623 Pressure ulcer of left heel, stage 3: Secondary | ICD-10-CM | POA: Insufficient documentation

## 2021-02-15 NOTE — Progress Notes (Addendum)
KORTNY, SULEMAN ([id number]) Visit Report for 02/15/2021 Arrival Information Details Patient Name: [name] [name], [name] [name] Date of Service: 02/15/2021 2:30 PM Medical Record Number: [id number] Patient Account Number: [id number] Date of Birth/Sex: 1927/01/03 (88 y.o. F) Treating RN: Donnamarie Poag Primary Care Yamin Swingler: Adrian Prows Other Clinician: Referring Elis Rawlinson: Adrian Prows Treating Kaianna Dolezal/Extender: Skipper Cliche in Treatment: 2 Visit Information History Since Last Visit Added or deleted any medications: No Patient Arrived: Stretcher Had a fall or experienced change in No Arrival Time: 14:39 activities of daily living that may affect Accompanied By: son risk of falls: Transfer Assistance: [place] since last visit: No Patient Identification Verified: Yes Has Dressing in Place as Prescribed: Yes Secondary Verification Process Completed: Yes Pain Present Now: No Patient Requires Transmission-Based Precautions: No Patient Has Alerts: Yes Patient Alerts: NOT Diabetic Electronic Signature(s) Signed: 02/15/2021 5:00:48 PM By: Donnamarie Poag Entered By: Donnamarie Poag on 02/15/2021 14:50:30 Birge, Baudelia ([id number]) -------------------------------------------------------------------------------- Clinic Level of Care Assessment Details Patient Name: [name] [name] Date of Service: 02/15/2021 2:30 PM Medical Record Number: [id number] Patient Account Number: [id number] Date of Birth/Sex: [birth year] (88 y.o. F) Treating RN: Donnamarie Poag Primary Care Kimothy Kishimoto: Adrian Prows Other Clinician: Referring Normal Recinos: Adrian Prows Treating Marcos Ruelas/Extender: Skipper Cliche in Treatment: 2 Clinic Level of Care Assessment Items TOOL 4 Quantity Score []  - Use when only an EandM is performed on FOLLOW-UP visit 0 ASSESSMENTS - Nursing Assessment / Reassessment []  - Reassessment of Co-morbidities (includes updates in patient status) 0 []  - 0 Reassessment of Adherence  to Treatment Plan ASSESSMENTS - Wound and Skin Assessment / Reassessment X - Simple Wound Assessment / Reassessment - one wound 1 5 []  - 0 Complex Wound Assessment / Reassessment - multiple wounds []  - 0 Dermatologic / Skin Assessment (not related to wound area) ASSESSMENTS - Focused Assessment []  - Circumferential Edema Measurements - multi extremities 0 []  - 0 Nutritional Assessment / Counseling / Intervention []  - 0 Lower Extremity Assessment (monofilament, tuning fork, pulses) []  - 0 Peripheral Arterial Disease Assessment (using hand held doppler) ASSESSMENTS - Ostomy and/or Continence Assessment and Care []  - Incontinence Assessment and Management 0 []  - 0 Ostomy Care Assessment and Management (repouching, etc.) PROCESS - Coordination of Care X - Simple Patient / Family Education for ongoing care 1 15 []  - 0 Complex (extensive) Patient / Family Education for ongoing care X- 1 10 Staff obtains Programmer, systems, Records, Test Results / Process Orders []  - 0 Staff telephones HHA, Nursing Homes / Clarify orders / etc []  - 0 Routine Transfer to another Facility (non-emergent condition) []  - 0 Routine Hospital Admission (non-emergent condition) []  - 0 New Admissions / Biomedical engineer / Ordering NPWT, Apligraf, etc. []  - 0 Emergency Hospital Admission (emergent condition) X- 1 10 Simple Discharge Coordination []  - 0 Complex (extensive) Discharge Coordination PROCESS - Special Needs []  - Pediatric / Minor Patient Management 0 []  - 0 Isolation Patient Management []  - 0 Hearing / Language / Visual special needs []  - 0 Assessment of Community assistance (transportation, D/C planning, etc.) []  - 0 Additional assistance / Altered mentation []  - 0 Support Surface(s) Assessment (bed, cushion, seat, etc.) INTERVENTIONS - Wound Cleansing / Measurement Rebman, Annemarie ([id number]) X- 1 5 Simple Wound Cleansing - one wound []  - 0 Complex Wound Cleansing - multiple  wounds X- 1 5 Wound Imaging (photographs - any number of wounds) []  - 0 Wound Tracing (instead of photographs) X- 1 5 Simple Wound Measurement - one wound []  - 0 Complex Wound Measurement - multiple  wounds INTERVENTIONS - Wound Dressings X - Small Wound Dressing one or multiple wounds 1 10 []  - 0 Medium Wound Dressing one or multiple wounds []  - 0 Large Wound Dressing one or multiple wounds X- 1 5 Application of Medications - topical []  - 0 Application of Medications - injection INTERVENTIONS - Miscellaneous []  - External ear exam 0 []  - 0 Specimen Collection (cultures, biopsies, blood, body fluids, etc.) []  - 0 Specimen(s) / Culture(s) sent or taken to Lab for analysis X- 1 10 Patient Transfer (multiple staff / Harrel Lemon Lift / Similar devices) []  - 0 Simple Staple / Suture removal (25 or less) []  - 0 Complex Staple / Suture removal (26 or more) []  - 0 Hypo / Hyperglycemic Management (close monitor of Blood Glucose) []  - 0 Ankle / Brachial Index (ABI) - do not check if billed separately X- 1 5 Vital Signs Has the patient been seen at the hospital within the last three years: Yes Total Score: 85 Level Of Care: New/Established - Level 3 Electronic Signature(s) Signed: 02/15/2021 5:00:48 PM By: Donnamarie Poag Entered By: Donnamarie Poag on 02/15/2021 15:27:06 Broadnax, Desiray ([id number]) -------------------------------------------------------------------------------- Encounter Discharge Information Details Patient Name: [name] [name] Date of Service: 02/15/2021 2:30 PM Medical Record Number: [id number] Patient Account Number: [id number] Date of Birth/Sex: [birth year] (88 y.o. F) Treating RN: Donnamarie Poag Primary Care Keandre Linden: Adrian Prows Other Clinician: Referring Sayla Golonka: Adrian Prows Treating Fredrick Dray/Extender: Skipper Cliche in Treatment: 2 Encounter Discharge Information Items Discharge Condition: Stable Ambulatory Status: Stretcher Discharge Destination:  Home Transportation: Ambulance Accompanied By: son Schedule Follow-up Appointment: Yes Clinical Summary of Care: Electronic Signature(s) Signed: 02/15/2021 5:00:48 PM By: Donnamarie Poag Entered By: Donnamarie Poag on 02/15/2021 15:28:28 Nath, [name] ([id number]) -------------------------------------------------------------------------------- Lower Extremity Assessment Details Patient Name: [name] [name] Date of Service: 02/15/2021 2:30 PM Medical Record Number: [id number] Patient Account Number: [id number] Date of Birth/Sex: Nov 14, 1926 (88 y.o. F) Treating RN: Donnamarie Poag Primary Care Alverda Nazzaro: Adrian Prows Other Clinician: Referring Sister Carbone: Adrian Prows Treating Aamina Skiff/Extender: Skipper Cliche in Treatment: 2 Edema Assessment Assessed: [Left: Yes] [Right: No] Edema: [Left: N] [Right: o] Vascular Assessment Pulses: Dorsalis Pedis Palpable: [Left:No Yes] Electronic Signature(s) Signed: 02/15/2021 5:00:48 PM By: Donnamarie Poag Entered By: Donnamarie Poag on 02/15/2021 14:59:48 [place], Leoni ([id number]) -------------------------------------------------------------------------------- Multi Wound Chart Details Patient Name: [name] [name] Date of Service: 02/15/2021 2:30 PM Medical Record Number: [id number] Patient Account Number: [id number] Date of Birth/Sex: 1926-07-31 (88 y.o. F) Treating RN: Donnamarie Poag Primary Care Tekila Caillouet: Adrian Prows Other Clinician: Referring Jonquil Stubbe: Adrian Prows Treating Gaston Dase/Extender: Skipper Cliche in Treatment: 2 Vital Signs Height(in): 71 Pulse(bpm): 36 Weight(lbs): 170 Blood Pressure(mmHg): 170/71 Body Mass Index(BMI): 23.7 Temperature(F): 98.2 Respiratory Rate(breaths/min): 16 Photos: [N/A:N/A] Wound Location: Left, Medial Calcaneus N/A N/A Wounding Event: Pressure Injury N/A N/A Primary Etiology: Pressure Ulcer N/A N/A Comorbid History: Cataracts, Lymphedema, N/A N/A Hypertension, Peripheral Venous Disease,  Osteoarthritis Date Acquired: 01/01/2021 N/A N/A Weeks of Treatment: 2 N/A N/A Wound Status: Open N/A N/A Wound Recurrence: No N/A N/A Measurements L x W x D (cm) 1.7x1.2x0.4 N/A N/A Area (cm) : 1.602 N/A N/A Volume (cm) : 0.641 N/A N/A % Reduction in Area: 53.60% N/A N/A % Reduction in Volume: 38.20% N/A N/A Classification: Category/Stage III N/A N/A Exudate Amount: Medium N/A N/A Exudate Type: Serous N/A N/A Exudate Color: amber N/A N/A Wound Margin: Flat and Intact N/A N/A Granulation Amount: Large (67-100%) N/A N/A Granulation Quality: Red, Hyper-granulation N/A N/A Necrotic Amount: Small (1-33%) N/A N/A Exposed Structures: Fat Layer (Subcutaneous Tissue):  N/A N/A Yes Fascia: No Tendon: No Muscle: No Joint: No Bone: No Epithelialization: None N/A N/A Treatment Notes Electronic Signature(s) Signed: 02/15/2021 5:00:48 PM By: Donnamarie Poag Entered By: Donnamarie Poag on 02/15/2021 15:00:53 [place], [place] ([id number]) -------------------------------------------------------------------------------- Multi-Disciplinary Care Plan Details Patient Name: [name] [name] Date of Service: 02/15/2021 2:30 PM Medical Record Number: [id number] Patient Account Number: [id number] Date of Birth/Sex: 01-27-1926 (88 y.o. F) Treating RN: Donnamarie Poag Primary Care Monigue Spraggins: Adrian Prows Other Clinician: Referring Arelis Neumeier: Adrian Prows Treating Kealii Thueson/Extender: Skipper Cliche in Treatment: 2 Active Inactive Electronic Signature(s) Signed: 03/01/2021 12:50:18 PM By: Donnamarie Poag Previous Signature: 03/01/2021 12:49:31 PM Version By: Donnamarie Poag Previous Signature: 02/15/2021 5:00:48 PM Version By: Donnamarie Poag Entered By: Donnamarie Poag on 03/01/2021 12:50:18 Tracz, Lynett ([id number]) -------------------------------------------------------------------------------- Pain Assessment Details Patient Name: [name] [name] Date of Service: 02/15/2021 2:30 PM Medical Record Number:  [id number] Patient Account Number: [id number] Date of Birth/Sex: 10-14-1926 (88 y.o. F) Treating RN: Donnamarie Poag Primary Care Nysia Dell: Adrian Prows Other Clinician: Referring Harlan Vinal: Adrian Prows Treating Adrea Sherpa/Extender: Skipper Cliche in Treatment: 2 Active Problems Location of Pain Severity and Description of Pain Patient Has Paino No Site Locations Rate the pain. Current Pain Level: 0 Pain Management and Medication Current Pain Management: Electronic Signature(s) Signed: 02/15/2021 5:00:48 PM By: Donnamarie Poag Entered By: Donnamarie Poag on 02/15/2021 14:51:35 Kulikowski, Paetyn ([id number]) -------------------------------------------------------------------------------- Patient/Caregiver Education Details Patient Name: [name] [name] Date of Service: 02/15/2021 2:30 PM Medical Record Number: [id number] Patient Account Number: [id number] Date of Birth/Gender: [birth year] (88 y.o. F) Treating RN: Donnamarie Poag Primary Care Physician: Adrian Prows Other Clinician: Referring Physician: Adrian Prows Treating Physician/Extender: Skipper Cliche in Treatment: 2 Education Assessment Education Provided To: Patient and Caregiver Education Topics Provided Basic Hygiene: Nutrition: Offloading: Wound/Skin Impairment: Electronic Signature(s) Signed: 02/15/2021 5:00:48 PM By: Donnamarie Poag Entered By: Donnamarie Poag on 02/15/2021 15:27:50 Zegarra, Zurii ([id number]) -------------------------------------------------------------------------------- Wound Assessment Details Patient Name: [name] [name] Date of Service: 02/15/2021 2:30 PM Medical Record Number: [id number] Patient Account Number: [id number] Date of Birth/Sex: Jun 10, 1926 (88 y.o. F) Treating RN: Donnamarie Poag Primary Care Mikia Delaluz: Adrian Prows Other Clinician: Referring Layia Walla: Adrian Prows Treating Adalynne Steffensmeier/Extender: Skipper Cliche in Treatment: 2 Wound Status Wound Number: 2 Primary Pressure  Ulcer Etiology: Wound Location: Left, Medial Calcaneus Wound Status: Open Wounding Event: Pressure Injury Comorbid Cataracts, Lymphedema, Hypertension, Peripheral Date Acquired: 01/01/2021 History: Venous Disease, Osteoarthritis Weeks Of Treatment: 2 Clustered Wound: No Photos Wound Measurements Length: (cm) 1.7 Width: (cm) 1.2 Depth: (cm) 0.4 Area: (cm) 1.602 Volume: (cm) 0.641 % Reduction in Area: 53.6% % Reduction in Volume: 38.2% Epithelialization: None Tunneling: No Undermining: No Wound Description Classification: Category/Stage III Wound Margin: Flat and Intact Exudate Amount: Medium Exudate Type: Serous Exudate Color: amber Foul Odor After Cleansing: No Slough/Fibrino Yes Wound Bed Granulation Amount: Large (67-100%) Exposed Structure Granulation Quality: Red, Hyper-granulation Fascia Exposed: No Necrotic Amount: Small (1-33%) Fat Layer (Subcutaneous Tissue) Exposed: Yes Necrotic Quality: Adherent Slough Tendon Exposed: No Muscle Exposed: No Joint Exposed: No Bone Exposed: No Electronic Signature(s) Signed: 02/15/2021 5:00:48 PM By: Donnamarie Poag Entered By: Donnamarie Poag on 02/15/2021 14:57:50 Taney, Gaila ([id number]) -------------------------------------------------------------------------------- [place] Details Patient Name: [name] [name] Date of Service: 02/15/2021 2:30 PM Medical Record Number: [id number] Patient Account Number: [id number] Date of Birth/Sex: [birth year] (88 y.o. F) Treating RN: Donnamarie Poag Primary Care Micky Sheller: Adrian Prows Other Clinician: Referring Favio Moder: Adrian Prows Treating Boyd Litaker/Extender: Skipper Cliche in Treatment: 2 Vital Signs Time Taken: 14:50 Temperature (F): 98.2 Height (in): 71 Pulse (bpm): 64 Weight (lbs):  170 Respiratory Rate (breaths/min): 16 Body Mass Index (BMI): 23.7 Blood Pressure (mmHg): 170/71 Reference Range: 80 - 120 mg / dl Electronic Signature(s) Signed: 02/15/2021 5:00:48 PM  By: Donnamarie Poag Entered ByDonnamarie Poag on 02/15/2021 14:51:24

## 2021-02-15 NOTE — Progress Notes (Addendum)
TYANAH, TIETJEN ([id number]) Visit Report for 02/15/2021 Chief Complaint Document Details Patient Name: [name] [name], [name] [name] Date of Service: 02/15/2021 2:30 PM Medical Record Number: [id number] Patient Account Number: [id number] Date of Birth/Sex: 12-03-1926 (88 y.o. F) Treating RN: Donnamarie Poag Primary Care Provider: Adrian Prows Other Clinician: Referring Provider: Adrian Prows Treating Provider/Extender: Skipper Cliche in Treatment: 2 Information Obtained from: Patient Chief Complaint Left heel pressure ulcer Electronic Signature(s) Signed: 02/15/2021 3:09:32 PM By: Worthy Keeler PA-C Entered By: Worthy Keeler on 02/15/2021 15:09:31 [name], [name] ([id number]) -------------------------------------------------------------------------------- HPI Details Patient Name: [name] [name] Date of Service: 02/15/2021 2:30 PM Medical Record Number: [id number] Patient Account Number: [id number] Date of Birth/Sex: 1926/05/05 (88 y.o. F) Treating RN: Donnamarie Poag Primary Care Provider: Adrian Prows Other Clinician: Referring Provider: Adrian Prows Treating Provider/Extender: Skipper Cliche in Treatment: 2 History of Present Illness HPI Description: 06/01/17-She is here for initial evaluation of left lower extremity ulcers. She states these have been present for approximately 6-7 months; she is unclear exactly who she has followed with, states [facility], people in [place]. She reports a history of lymphedema pumps with extremely sporadic use over the last 2 years, none recently. She has been treated with Unna boots in the left lower extremity, no compression to the right lower extremity. She states her lower extremity edema is at/near baseline, I have low suspicion for DVT prior to aggressively compressing and initiating lymphdema pumps twice daily; she denies IVC filter. I see no recent ultrasound in Epic; last DVT study was to the left in February. She denies pain, low  suspicion of cellulitis. 06/15/17-She is here in follow-up evaluation for bilateral lower extremity edema and left lower extremity ulcers. She was a no call/no show at last week's appointment so we have been unable to confirm her home health agency. She did go for the ordered venous ultrasound 2 weeks ago which was negative for DVT bilaterally. Her family member confirms that she was discharged from [facility] and that they ordered home health at discharge; we will reach out to them. Despite this home health has been applying unna boots to the left leg. Readmission: 02/01/2021 upon evaluation today patient appears for reevaluation in the clinic although symptoms have been since 2019 last saw her. Subsequently the issue today is actually a wound on the medial aspect of her left heel. Fortunately I do not see any signs of active infection at this time which is great news. No fevers, chills, nausea, vomiting, or diarrhea. Of note the patient does have a history of hypertension, congestive heart failure, and peripheral vascular disease. She is on a blood thinner, Eliquis. Although her son and the patient both were stating she was not taking anything this was picked up on the ninth of this month according to the records from the pharmacy. This was along with the Augmentin which apparently she is also taking according to the son. Either way I am concerned about the fact that they do not really know exactly what she is taking and how often and I want them to go home and check on this as soon as they leave here today. 02/15/2021 upon evaluation today patient appears to be doing about the same in regard to her wound. Fortunately I do not see any signs of active infection at this time which is great news. Patient has been placed on palliative care since I last saw her according to her caregiver. Nonetheless I do believe that the wound is showing signs  of no infection which is great news. Nonetheless  I still think that this is going to be somewhat difficult to heal with her ABIs the way they are. Electronic Signature(s) Signed: 02/15/2021 3:32:24 PM By: Worthy Keeler PA-C Entered By: Worthy Keeler on 02/15/2021 15:32:23 [name] [name] ([id number]) -------------------------------------------------------------------------------- Physical Exam Details Patient Name: [name] [name] Date of Service: 02/15/2021 2:30 PM Medical Record Number: [id number] Patient Account Number: [id number] Date of Birth/Sex: 12-05-1926 (88 y.o. F) Treating RN: Donnamarie Poag Primary Care Provider: Adrian Prows Other Clinician: Referring Provider: Adrian Prows Treating Provider/Extender: Skipper Cliche in Treatment: 2 Constitutional Well-nourished and well-hydrated in no acute distress. Respiratory normal breathing without difficulty. Psychiatric this patient is able to make decisions and demonstrates good insight into disease process. Alert and Oriented x 3. pleasant and cooperative. Notes Upon inspection patient's wound bed actually showed signs of some discoloration that could be related to bruising/deep tissue injury or potentially could be related to the [facility] either way I do believe her ABIs are somewhat suspect and we need to see what that shows she should have that upcoming. Electronic Signature(s) Signed: 02/15/2021 3:32:45 PM By: Worthy Keeler PA-C Entered By: Worthy Keeler on 02/15/2021 15:32:45 [name], [name] ([id number]) -------------------------------------------------------------------------------- Physician Orders Details Patient Name: [name] [name] Date of Service: 02/15/2021 2:30 PM Medical Record Number: [id number] Patient Account Number: [id number] Date of Birth/Sex: 01/02/1927 (88 y.o. F) Treating RN: Donnamarie Poag Primary Care Provider: Adrian Prows Other Clinician: Referring Provider: Adrian Prows Treating Provider/Extender: Skipper Cliche in  Treatment: 2 Verbal / Phone Orders: No Diagnosis Coding ICD-10 Coding Code Description [phone] Pressure ulcer of left heel, stage 3 I73.89 Other specified peripheral vascular diseases I10 Essential (primary) hypertension I50.42 Chronic combined systolic (congestive) and diastolic (congestive) heart failure Follow-up Appointments Wound #2 Left,Medial Calcaneus o Return Appointment in 1 week. Bathing/ Shower/ Hygiene o May shower; gently cleanse wound with antibacterial soap, rinse and pat dry prior to dressing wounds o No tub bath. Anesthetic (Use 'Patient Medications' Section for Anesthetic Order Entry) o Lidocaine applied to wound bed Edema Control - Lymphedema / Segmental Compressive Device / Other o Elevate legs to the level of the heart and pump ankles as often as possible o Elevate leg(s) parallel to the floor when sitting. Additional Orders / Instructions o Follow Nutritious Diet and Increase Protein Intake Wound Treatment Wound #2 - Calcaneus Wound Laterality: Left, Medial Cleanser: Wound Cleanser (Generic) 3 x Per Week/30 Days Discharge Instructions: Wash your hands with soap and water. Remove old dressing, discard into plastic bag and place into trash. Cleanse the wound with Wound Cleanser prior to applying a clean dressing using gauze sponges, not tissues or cotton balls. Do not scrub or use excessive force. Pat dry using gauze sponges, not tissue or cotton balls. Primary Dressing: Hydrofera Blue Ready Transfer Foam, 2.5x2.5 (in/in) (Generic) 3 x Per Week/30 Days Discharge Instructions: Apply Hydrofera Blue Ready to wound bed as directed Secondary Dressing: Kerlix 4.5 x 4.1 (in/yd) (Generic) 3 x Per Week/30 Days Discharge Instructions: Apply Kerlix 4.5 x 4.1 (in/yd) as instructed Secured With: 16M Medipore H Soft Cloth Surgical Tape, 2x2 (in/yd) (Generic) 3 x Per Week/30 Days Electronic Signature(s) Signed: 02/15/2021 5:00:48 PM By: Donnamarie Poag Signed: 02/16/2021  10:02:31 AM By: Worthy Keeler PA-C Entered By: Donnamarie Poag on 02/15/2021 15:26:33 [name], [name] ([id number]) -------------------------------------------------------------------------------- Problem List Details Patient Name: [name] [name] Date of Service: 02/15/2021 2:30 PM Medical Record Number: [id number] Patient Account Number: [id number] Date of Birth/Sex: 02/02/1926 (  88 y.o. F) Treating RN: Donnamarie Poag Primary Care Provider: Adrian Prows Other Clinician: Referring Provider: Adrian Prows Treating Provider/Extender: Skipper Cliche in Treatment: 2 Active Problems ICD-10 Encounter Code Description Active Date MDM Diagnosis [phone] Pressure ulcer of left heel, stage 3 02/01/2021 No Yes I73.89 Other specified peripheral vascular diseases 02/01/2021 No Yes I10 Essential (primary) hypertension 02/01/2021 No Yes I50.42 Chronic combined systolic (congestive) and diastolic (congestive) heart 02/01/2021 No Yes failure Inactive Problems Resolved Problems Electronic Signature(s) Signed: 02/15/2021 3:09:24 PM By: Worthy Keeler PA-C Entered By: Worthy Keeler on 02/15/2021 15:09:24 [name], [name] ([id number]) -------------------------------------------------------------------------------- Progress Note Details Patient Name: [name] [name] Date of Service: 02/15/2021 2:30 PM Medical Record Number: [id number] Patient Account Number: [id number] Date of Birth/Sex: [birth year] (88 y.o. F) Treating RN: Donnamarie Poag Primary Care Provider: Adrian Prows Other Clinician: Referring Provider: Adrian Prows Treating Provider/Extender: Skipper Cliche in Treatment: 2 Subjective Chief Complaint Information obtained from Patient Left heel pressure ulcer History of Present Illness (HPI) 06/01/17-She is here for initial evaluation of left lower extremity ulcers. She states these have been present for approximately 6-7 months; she is unclear exactly who she has followed with,  states [facility], people in [place]. She reports a history of lymphedema pumps with extremely sporadic use over the last 2 years, none recently. She has been treated with Unna boots in the left lower extremity, no compression to the right lower extremity. She states her lower extremity edema is at/near baseline, I have low suspicion for DVT prior to aggressively compressing and initiating lymphdema pumps twice daily; she denies IVC filter. I see no recent ultrasound in Epic; last DVT study was to the left in February. She denies pain, low suspicion of cellulitis. 06/15/17-She is here in follow-up evaluation for bilateral lower extremity edema and left lower extremity ulcers. She was a no call/no show at last week's appointment so we have been unable to confirm her home health agency. She did go for the ordered venous ultrasound 2 weeks ago which was negative for DVT bilaterally. Her family member confirms that she was discharged from [facility] and that they ordered home health at discharge; we will reach out to them. Despite this home health has been applying unna boots to the left leg. Readmission: 02/01/2021 upon evaluation today patient appears for reevaluation in the clinic although symptoms have been since 2019 last saw her. Subsequently the issue today is actually a wound on the medial aspect of her left heel. Fortunately I do not see any signs of active infection at this time which is great news. No fevers, chills, nausea, vomiting, or diarrhea. Of note the patient does have a history of hypertension, congestive heart failure, and peripheral vascular disease. She is on a blood thinner, Eliquis. Although her son and the patient both were stating she was not taking anything this was picked up on the ninth of this month according to the records from the pharmacy. This was along with the Augmentin which apparently she is also taking according to the son. Either way I  am concerned about the fact that they do not really know exactly what she is taking and how often and I want them to go home and check on this as soon as they leave here today. 02/15/2021 upon evaluation today patient appears to be doing about the same in regard to her wound. Fortunately I do not see any signs of active infection at this time which is great news. Patient has been  placed on palliative care since I last saw her according to her caregiver. Nonetheless I do believe that the wound is showing signs of no infection which is great news. Nonetheless I still think that this is going to be somewhat difficult to heal with her ABIs the way they are. Objective Constitutional Well-nourished and well-hydrated in no acute distress. Vitals Time Taken: 2:50 PM, Height: 71 in, Weight: 170 lbs, BMI: 23.7, Temperature: 98.2 F, Pulse: 64 bpm, Respiratory Rate: 16 breaths/min, Blood Pressure: 170/71 mmHg. Respiratory normal breathing without difficulty. Psychiatric this patient is able to make decisions and demonstrates good insight into disease process. Alert and Oriented x 3. pleasant and cooperative. General Notes: Upon inspection patient's wound bed actually showed signs of some discoloration that could be related to bruising/deep tissue injury or potentially could be related to the [facility] either way I do believe her ABIs are somewhat suspect and we need to see what that shows she should have that upcoming. Integumentary (Hair, Skin) Wound #2 status is Open. Original cause of wound was Pressure Injury. The date acquired was: 01/01/2021. The wound has been in treatment 2 [name], [name] ([id number]) weeks. The wound is located on the Left,Medial Calcaneus. The wound measures 1.7cm length x 1.2cm width x 0.4cm depth; 1.602cm^2 area and 0.641cm^3 volume. There is Fat Layer (Subcutaneous Tissue) exposed. There is no tunneling or undermining noted. There is a medium amount of serous drainage  noted. The wound margin is flat and intact. There is large (67-100%) red, hyper - granulation within the wound bed. There is a small (1-33%) amount of necrotic tissue within the wound bed including Adherent Slough. Assessment Active Problems ICD-10 Pressure ulcer of left heel, stage 3 Other specified peripheral vascular diseases Essential (primary) hypertension Chronic combined systolic (congestive) and diastolic (congestive) heart failure Plan Follow-up Appointments: Wound #2 Left,Medial Calcaneus: Return Appointment in 1 week. Bathing/ Shower/ Hygiene: May shower; gently cleanse wound with antibacterial soap, rinse and pat dry prior to dressing wounds No tub bath. Anesthetic (Use 'Patient Medications' Section for Anesthetic Order Entry): Lidocaine applied to wound bed Edema Control - Lymphedema / Segmental Compressive Device / Other: Elevate legs to the level of the heart and pump ankles as often as possible Elevate leg(s) parallel to the floor when sitting. Additional Orders / Instructions: Follow Nutritious Diet and Increase Protein Intake WOUND #2: - Calcaneus Wound Laterality: Left, Medial Cleanser: Wound Cleanser (Generic) 3 x Per Week/30 Days Discharge Instructions: Wash your hands with soap and water. Remove old dressing, discard into plastic bag and place into trash. Cleanse the wound with Wound Cleanser prior to applying a clean dressing using gauze sponges, not tissues or cotton balls. Do not scrub or use excessive force. Pat dry using gauze sponges, not tissue or cotton balls. Primary Dressing: Hydrofera Blue Ready Transfer Foam, 2.5x2.5 (in/in) (Generic) 3 x Per Week/30 Days Discharge Instructions: Apply Hydrofera Blue Ready to wound bed as directed Secondary Dressing: Kerlix 4.5 x 4.1 (in/yd) (Generic) 3 x Per Week/30 Days Discharge Instructions: Apply Kerlix 4.5 x 4.1 (in/yd) as instructed Secured With: 49M Medipore H Soft Cloth Surgical Tape, 2x2 (in/yd) (Generic) 3 x  Per Week/30 Days 1. I would recommend currently that the patient needs to have ABIs as soon as possible. That was scheduled for Wednesday although it sounds like there can have to move this to a different day as they do not have transportation for that day. 2. I am also can recommend that we have the patient continue with  the [facility] with a Kerlix lightly secured in place. 3. I would also recommend the patient should continue to try to keep pressure off of the heel floating the heel is the best way to go about this. We will see patient back for reevaluation in 1 week here in the clinic. If anything worsens or changes patient will contact our office for additional recommendations. Electronic Signature(s) Signed: 02/15/2021 3:33:18 PM By: Worthy Keeler PA-C Entered By: Worthy Keeler on 02/15/2021 15:33:18 [name], [name] ([id number]) -------------------------------------------------------------------------------- SuperBill Details Patient Name: [name] [name] Date of Service: 02/15/2021 Medical Record Number: [id number] Patient Account Number: [id number] Date of Birth/Sex: January 03, 1927 (88 y.o. F) Treating RN: Donnamarie Poag Primary Care Provider: Adrian Prows Other Clinician: Referring Provider: Adrian Prows Treating Provider/Extender: Skipper Cliche in Treatment: 2 Diagnosis Coding ICD-10 Codes Code Description [phone] Pressure ulcer of left heel, stage 3 I73.89 Other specified peripheral vascular diseases I10 Essential (primary) hypertension I50.42 Chronic combined systolic (congestive) and diastolic (congestive) heart failure Facility Procedures CPT4 Code: [id number] Description: 99213 - WOUND CARE VISIT-LEV 3 EST PT Modifier: Quantity: 1 Physician Procedures CPT4 Code: [id number] Description: 99214 - WC PHYS LEVEL 4 - EST PT Modifier: Quantity: 1 CPT4 Code: Description: ICD-10 Diagnosis Description L89.623 Pressure ulcer of left heel, stage 3 I73.89 Other  specified peripheral vascular diseases I10 Essential (primary) hypertension I50.42 Chronic combined systolic (congestive) and diastolic (congestive) hear Modifier: t failure Quantity: Electronic Signature(s) Signed: 02/15/2021 3:33:36 PM By: Worthy Keeler PA-C Entered By: Worthy Keeler on 02/15/2021 15:33:36

## 2021-02-17 ENCOUNTER — Encounter (INDEPENDENT_AMBULATORY_CARE_PROVIDER_SITE_OTHER): Payer: Medicare HMO

## 2021-02-22 ENCOUNTER — Encounter: Payer: Medicare Other | Admitting: Physician Assistant

## 2021-02-22 ENCOUNTER — Ambulatory Visit: Payer: Medicare HMO | Admitting: Physician Assistant

## 2021-03-01 ENCOUNTER — Ambulatory Visit: Payer: Medicare HMO | Admitting: Physician Assistant

## 2021-03-01 ENCOUNTER — Encounter: Payer: Medicare Other | Admitting: Physician Assistant

## 2021-03-08 ENCOUNTER — Ambulatory Visit: Payer: Medicare HMO | Admitting: Physician Assistant

## 2021-03-08 ENCOUNTER — Encounter: Payer: Medicare Other | Admitting: Physician Assistant

## 2021-11-02 ENCOUNTER — Observation Stay: Payer: Medicare HMO

## 2021-11-02 ENCOUNTER — Encounter: Payer: Self-pay | Admitting: Emergency Medicine

## 2021-11-02 ENCOUNTER — Emergency Department: Payer: Medicare HMO

## 2021-11-02 ENCOUNTER — Observation Stay
Admission: EM | Admit: 2021-11-02 | Discharge: 2021-11-04 | Disposition: A | Discharge status: Home / Self Care | Payer: Medicare HMO | Attending: Student in an Organized Health Care Education/Training Program | Admitting: Student in an Organized Health Care Education/Training Program

## 2021-11-02 ENCOUNTER — Other Ambulatory Visit: Payer: Self-pay

## 2021-11-02 DIAGNOSIS — L03116 Cellulitis of left lower limb: Secondary | ICD-10-CM | POA: Diagnosis not present

## 2021-11-02 DIAGNOSIS — M6281 Muscle weakness (generalized): Secondary | ICD-10-CM | POA: Insufficient documentation

## 2021-11-02 DIAGNOSIS — Z7901 Long term (current) use of anticoagulants: Secondary | ICD-10-CM | POA: Diagnosis not present

## 2021-11-02 DIAGNOSIS — N179 Acute kidney failure, unspecified: Secondary | ICD-10-CM | POA: Diagnosis present

## 2021-11-02 DIAGNOSIS — F03B0 Unspecified dementia, moderate, without behavioral disturbance, psychotic disturbance, mood disturbance, and anxiety: Secondary | ICD-10-CM

## 2021-11-02 DIAGNOSIS — L089 Local infection of the skin and subcutaneous tissue, unspecified: Secondary | ICD-10-CM | POA: Diagnosis present

## 2021-11-02 DIAGNOSIS — R2689 Other abnormalities of gait and mobility: Secondary | ICD-10-CM | POA: Insufficient documentation

## 2021-11-02 DIAGNOSIS — L0390 Cellulitis, unspecified: Secondary | ICD-10-CM | POA: Diagnosis present

## 2021-11-02 DIAGNOSIS — Z7409 Other reduced mobility: Secondary | ICD-10-CM | POA: Diagnosis not present

## 2021-11-02 DIAGNOSIS — R0902 Hypoxemia: Secondary | ICD-10-CM | POA: Diagnosis not present

## 2021-11-02 DIAGNOSIS — I82402 Acute embolism and thrombosis of unspecified deep veins of left lower extremity: Secondary | ICD-10-CM | POA: Diagnosis present

## 2021-11-02 DIAGNOSIS — L97925 Non-pressure chronic ulcer of unspecified part of left lower leg with muscle involvement without evidence of necrosis: Secondary | ICD-10-CM | POA: Diagnosis not present

## 2021-11-02 DIAGNOSIS — M1712 Unilateral primary osteoarthritis, left knee: Secondary | ICD-10-CM | POA: Diagnosis not present

## 2021-11-02 DIAGNOSIS — F0390 Unspecified dementia without behavioral disturbance: Secondary | ICD-10-CM | POA: Diagnosis present

## 2021-11-02 DIAGNOSIS — Z86718 Personal history of other venous thrombosis and embolism: Secondary | ICD-10-CM | POA: Insufficient documentation

## 2021-11-02 DIAGNOSIS — L97909 Non-pressure chronic ulcer of unspecified part of unspecified lower leg with unspecified severity: Secondary | ICD-10-CM

## 2021-11-02 DIAGNOSIS — L97902 Non-pressure chronic ulcer of unspecified part of unspecified lower leg with fat layer exposed: Principal | ICD-10-CM | POA: Insufficient documentation

## 2021-11-02 DIAGNOSIS — I959 Hypotension, unspecified: Secondary | ICD-10-CM | POA: Diagnosis not present

## 2021-11-02 DIAGNOSIS — Z79899 Other long term (current) drug therapy: Secondary | ICD-10-CM | POA: Diagnosis not present

## 2021-11-02 DIAGNOSIS — S81802A Unspecified open wound, left lower leg, initial encounter: Secondary | ICD-10-CM | POA: Diagnosis not present

## 2021-11-02 LAB — CBC WITH DIFFERENTIAL/PLATELET
Abs Immature Granulocytes: 0.01 10*3/uL (ref 0.00–0.07)
Basophils Absolute: 0.1 10*3/uL (ref 0.0–0.1)
Basophils Relative: 2 %
Eosinophils Absolute: 0.3 10*3/uL (ref 0.0–0.5)
Eosinophils Relative: 8 %
HCT: 33.1 % — ABNORMAL LOW (ref 36.0–46.0)
Hemoglobin: 9.8 g/dL — ABNORMAL LOW (ref 12.0–15.0)
Immature Granulocytes: 0 %
Lymphocytes Relative: 27 %
Lymphs Abs: 1 10*3/uL (ref 0.7–4.0)
MCH: 29 pg (ref 26.0–34.0)
MCHC: 29.6 g/dL — ABNORMAL LOW (ref 30.0–36.0)
MCV: 97.9 fL (ref 80.0–100.0)
Monocytes Absolute: 0.4 10*3/uL (ref 0.1–1.0)
Monocytes Relative: 9 %
Neutro Abs: 2 10*3/uL (ref 1.7–7.7)
Neutrophils Relative %: 54 %
Platelets: 289 10*3/uL (ref 150–400)
RBC: 3.38 MIL/uL — ABNORMAL LOW (ref 3.87–5.11)
RDW: 13.8 % (ref 11.5–15.5)
WBC: 3.8 10*3/uL — ABNORMAL LOW (ref 4.0–10.5)
nRBC: 0 % (ref 0.0–0.2)

## 2021-11-02 LAB — COMPREHENSIVE METABOLIC PANEL
ALT: 8 U/L (ref 0–44)
AST: 11 U/L — ABNORMAL LOW (ref 15–41)
Albumin: 3.3 g/dL — ABNORMAL LOW (ref 3.5–5.0)
Alkaline Phosphatase: 62 U/L (ref 38–126)
Anion gap: 7 (ref 5–15)
BUN: 27 mg/dL — ABNORMAL HIGH (ref 8–23)
CO2: 25 mmol/L (ref 22–32)
Calcium: 9.3 mg/dL (ref 8.9–10.3)
Chloride: 108 mmol/L (ref 98–111)
Creatinine, Ser: 1.05 mg/dL — ABNORMAL HIGH (ref 0.44–1.00)
GFR, Estimated: 50 mL/min — ABNORMAL LOW (ref >60)
Glucose, Bld: 105 mg/dL — ABNORMAL HIGH (ref 70–99)
Potassium: 4.8 mmol/L (ref 3.5–5.1)
Sodium: 140 mmol/L (ref 135–145)
Total Bilirubin: 0.4 mg/dL (ref 0.3–1.2)
Total Protein: 6.9 g/dL (ref 6.5–8.1)

## 2021-11-02 LAB — C-REACTIVE PROTEIN: CRP: 1.5 mg/dL — ABNORMAL HIGH (ref <1.0)

## 2021-11-02 LAB — SEDIMENTATION RATE: Sed Rate: 52 mm/hr — ABNORMAL HIGH (ref 0–30)

## 2021-11-02 LAB — HEMOGLOBIN A1C
Hgb A1c MFr Bld: 5.1 % (ref 4.8–5.6)
Mean Plasma Glucose: 99.67 mg/dL

## 2021-11-02 MED ORDER — VANCOMYCIN HCL 1250 MG/250ML IV SOLN: 1250.0000 mg | INTRAVENOUS | Status: DC

## 2021-11-02 MED ORDER — SODIUM CHLORIDE 0.9 % IV SOLN
1.0000 g | Freq: Once | INTRAVENOUS | Status: AC
  Administered 2021-11-02: 1 g via INTRAVENOUS
  Filled 2021-11-02: qty 10

## 2021-11-02 MED ORDER — SODIUM CHLORIDE 0.9 % IV SOLN
2.0000 g | INTRAVENOUS | Status: DC
  Administered 2021-11-03: 2 g via INTRAVENOUS
  Filled 2021-11-02: qty 20

## 2021-11-02 MED ORDER — ENSURE ENLIVE PO LIQD
237.0000 mL | Freq: Three times a day (TID) | ORAL | Status: DC
  Administered 2021-11-02 – 2021-11-04 (×5): 237 mL via ORAL

## 2021-11-02 MED ORDER — ENOXAPARIN SODIUM 60 MG/0.6ML IJ SOSY: 0.5000 mg/kg | PREFILLED_SYRINGE | INTRAMUSCULAR | Status: DC

## 2021-11-02 MED ORDER — VANCOMYCIN HCL 750 MG/150ML IV SOLN
750.0000 mg | Freq: Once | INTRAVENOUS | Status: AC
  Administered 2021-11-02: 750 mg via INTRAVENOUS
  Filled 2021-11-02: qty 150

## 2021-11-02 MED ORDER — MEDIHONEY WOUND/BURN DRESSING EX PSTE
1.0000 | PASTE | Freq: Every day | CUTANEOUS | Status: DC
  Administered 2021-11-03 – 2021-11-04 (×3): 1 via TOPICAL
  Filled 2021-11-02: qty 44

## 2021-11-02 MED ORDER — ACETAMINOPHEN 325 MG PO TABS
650.0000 mg | ORAL_TABLET | Freq: Four times a day (QID) | ORAL | Status: DC | PRN
  Administered 2021-11-03 – 2021-11-04 (×2): 650 mg via ORAL
  Filled 2021-11-02 (×2): qty 2

## 2021-11-02 MED ORDER — VANCOMYCIN HCL IN DEXTROSE 1-5 GM/200ML-% IV SOLN
1000.0000 mg | Freq: Once | INTRAVENOUS | Status: AC
  Administered 2021-11-02: 1000 mg via INTRAVENOUS
  Filled 2021-11-02: qty 200

## 2021-11-02 MED ORDER — POLYETHYLENE GLYCOL 3350 17 G PO PACK: 17.0000 g | PACK | Freq: Every day | ORAL | Status: DC | PRN

## 2021-11-02 MED ORDER — APIXABAN 5 MG PO TABS
5.0000 mg | ORAL_TABLET | Freq: Two times a day (BID) | ORAL | Status: DC
  Administered 2021-11-02 – 2021-11-04 (×4): 5 mg via ORAL
  Filled 2021-11-02 (×4): qty 1

## 2021-11-02 MED ORDER — LACTATED RINGERS IV SOLN: INTRAVENOUS | Status: DC

## 2021-11-02 MED ORDER — ACETAMINOPHEN 650 MG RE SUPP: 650.0000 mg | Freq: Four times a day (QID) | RECTAL | Status: DC | PRN

## 2021-11-02 MED ORDER — ADULT MULTIVITAMIN W/MINERALS CH
1.0000 | ORAL_TABLET | Freq: Every day | ORAL | Status: DC
  Administered 2021-11-02 – 2021-11-04 (×3): 1 via ORAL
  Filled 2021-11-02 (×3): qty 1

## 2021-11-02 NOTE — Assessment & Plan Note (Signed)
Patient is oriented to self and location as well as situation but not to time. She is uncertain where she lives her whom she lives with.  I discussed this with patient's son, [name] [name], with whom [name] [name] lives with.  She states he states this is patient's baseline.

## 2021-11-02 NOTE — Assessment & Plan Note (Signed)
Patient presenting with several week history of left lower extremity ulcer with significant worsening over the last 2 weeks including enlarging and purulent drainage.  Now complicated by surrounding cellulitis.  X-ray obtained and negative for osteomyelitis, however given elevated inflammatory markers, will obtain MRI for definitive evaluation.  ABIs obtained and consistent with mild to moderate PAD.  Due to this, most likely venous ulcer.   - Continue ceftriaxone and vancomycin - MRI pending - Pending MRI results, consider general surgery versus orthopedic surgery consultation - Wound care consulted

## 2021-11-02 NOTE — Assessment & Plan Note (Signed)
In the setting of infected lower extremity ulcer.  - Continue ceftriaxone and vancomycin

## 2021-11-02 NOTE — Consult Note (Signed)
[facility] Nurse Consult Note: Reason for Consult:left lateral LE full thickness wound. See photos taken by Provider and uploaded to EHR. Wound type: arterial insufficiency vs mixed etiology vs trauma vs infection Pressure Injury POA: N/A Measurement:To be obtained by Bedside RN today and documented on Nursing Flow Sheet with first application of dressing Wound bed:red (870%), yellow slough (30%) Drainage (amount, consistency, odor) dry Periwound: with edema, erythema Dressing procedure/placement/frequency: Nursing is provided with guidance for turning and repositioning to minimize time in the supine position, and topical care guidance using a NS cleanse followed by application of an antimicrobial agent for wound healing support, MediHoney.This is to be applied daily and topped with a dry gauze dressing, ABD pad and secured with Kerlix roll gauze/paper tape. A pressure redistribution chair cushion is provided for OOB use in the chair and is to be sent with the patient at time of discharge. Bilateral Pressure redistribution heel boots are provided.  [facility] nursing team will not follow, but will remain available to this patient, the nursing and medical teams.  Please re-consult if needed.  Thank you for inviting [place] to participate in this patient's Plan of Care.  Maudie Flakes, MSN, RN, CNS, [place], Serita Grammes, [employer], [employer] phone:  [phone]

## 2021-11-02 NOTE — ED Triage Notes (Signed)
First Nurse Note:  Pt via EMS from home. Pt has R leg wound, redness, and hot to touch. Possible cellulitis, states she has had it for months and wound care used to assess it but does not anymore. Family also concerned for UTI, pt is A&OX4 and NAD   63 HR  145/69  98% on RA 98.1 oral

## 2021-11-02 NOTE — Assessment & Plan Note (Signed)
Mildly elevated creatinine at 1.05.  Previously between 0.6 and 0.9.Marland Kitchen  Given patient's underlying dementia, I am uncertain if she is keeping up with fluid intake.  We will gently rehydrate and recheck BMP in the morning.  - LR at 100 cc/hr for 12 hours  - Recheck BMP in the morning

## 2021-11-02 NOTE — Consult Note (Addendum)
Pharmacy Antibiotic Note  [name] [name] is a 88 y.o. female admitted on 11/02/2021 with  wound infection .  Pharmacy has been consulted for Vancomycin dosing.  Plan: Vancomycin 1750mg  loading dose (1000mg  in ED + 750mg  ordered), then Vancomycin 1250 mg IV Q 48 hrs. Goal AUC 400-550. Expected AUC: 461.9 SCr used: 1.05   Height: 5\' 6"  (167.6 cm) Weight: 89 kg (196 lb 3.4 oz) IBW/kg (Calculated) : 59.3  Temp (24hrs), Avg:97.8 F (36.6 C), Min:97.5 F (36.4 C), Max:98 F (36.7 C)  Recent Labs  Lab 11/02/21 1409  WBC 3.8*  CREATININE 1.05*    Estimated Creatinine Clearance: 37.6 mL/min (A) (by C-G formula based on SCr of 1.05 mg/dL (H)).    No Known Allergies  Antimicrobials this admission: Ceftriaxone 10/24 >>  Vancomycin 10/24 >>   Thank you for allowing pharmacy to be a part of this patient's care.  Iliyana Convey Rodriguez-Guzman PharmD, BCPS 11/02/2021 5:23 PM

## 2021-11-02 NOTE — ED Provider Notes (Signed)
[facility] Provider Note    Event Date/Time   First MD Initiated Contact with Patient 11/02/21 1335     (approximate)   History   Chief Complaint Wound Infection   HPI  Dorreen Valiente is a 88 y.o. female with past medical history of dementia, hypertension, CKD, CHF, and DVT on Eliquis who presents to the ED complaining of wound infection.  History is limited due to patient's baseline dementia and patient currently states she is just here for a blood draw.  Speaking with her son over the phone, patient has had gradually worsening wound to the left lateral portion of her lower leg over the past couple of months.  He states that she saw a wound doctor a couple of months ago and it seemed to be doing well at that time, has worsened since then with increasing size of the wound and redness spreading up and down her leg.  She has not had any fevers and he states she has otherwise been doing well.  She is wheelchair-bound at baseline, has not had any falls onto the leg.     Physical Exam   Triage Vital Signs: ED Triage Vitals  Enc Vitals Group     BP 11/02/21 1311 138/80     Pulse Rate 11/02/21 1311 62     Resp 11/02/21 1311 20     Temp 11/02/21 1311 98 F (36.7 C)     Temp Source 11/02/21 1311 Oral     SpO2 11/02/21 1311 98 %     Weight 11/02/21 1308 196 lb 3.4 oz (89 kg)     Height 11/02/21 1308 5\' 6"  (1.676 m)     Head Circumference --      Peak Flow --      Pain Score 11/02/21 1308 0     Pain Loc --      Pain Edu? --      Excl. in [place]? --     Most recent vital signs: Vitals:   11/02/21 1311  BP: 138/80  Pulse: 62  Resp: 20  Temp: 98 F (36.7 C)  SpO2: 98%    Constitutional: Alert and oriented. Eyes: Conjunctivae are normal. Head: Atraumatic. Nose: No congestion/rhinnorhea. Mouth/Throat: Mucous membranes are moist.  Cardiovascular: Normal rate, regular rhythm. Grossly normal heart sounds.  2+ radial pulses and 1+ DP pulses  bilaterally. Respiratory: Normal respiratory effort.  No retractions. Lungs CTAB. Gastrointestinal: Soft and nontender. No distention. Musculoskeletal: Open wound to left lateral lower leg with purulent drainage.  Surrounding erythema and warmth noted tracking up to the knee and as far as the ankle. Neurologic:  Normal speech and language. No gross focal neurologic deficits are appreciated.    ED Results / Procedures / Treatments   Labs (all labs ordered are listed, but only abnormal results are displayed) Labs Reviewed  COMPREHENSIVE METABOLIC PANEL - Abnormal; Notable for the following components:      Result Value   Glucose, Bld 105 (*)    BUN 27 (*)    Creatinine, Ser 1.05 (*)    Albumin 3.3 (*)    AST 11 (*)    GFR, Estimated 50 (*)    All other components within normal limits  CBC WITH DIFFERENTIAL/PLATELET - Abnormal; Notable for the following components:   WBC 3.8 (*)    RBC 3.38 (*)    Hemoglobin 9.8 (*)    HCT 33.1 (*)    MCHC 29.6 (*)    All other components  within normal limits  URINALYSIS, ROUTINE W REFLEX MICROSCOPIC    RADIOLOGY Left tib-fib x-ray reviewed and interpreted by me with no fracture, dislocation, or signs of osteomyelitis.  PROCEDURES:  Critical Care performed: No  Procedures   MEDICATIONS ORDERED IN ED: Medications  vancomycin (VANCOCIN) IVPB 1000 mg/200 mL premix (1,000 mg Intravenous New Bag/Given 11/02/21 1446)  cefTRIAXone (ROCEPHIN) 1 g in sodium chloride 0.9 % 100 mL IVPB (0 g Intravenous Stopped 11/02/21 1446)     IMPRESSION / MDM / ASSESSMENT AND PLAN / ED COURSE  I reviewed the triage vital signs and the nursing notes.                              88 y.o. female with past medical history of hypertension, CKD, CHF, DVT on Eliquis, and dementia who presents to the ED with worsening wound to the left lower leg over the past couple months with recent spreading of redness and warmth.  Patient's presentation is most consistent  with acute presentation with potential threat to life or bodily function.  Differential diagnosis includes, but is not limited to, cellulitis, abscess, osteomyelitis, venous insufficiency, arterial insufficiency.  Patient nontoxic-appearing and in no acute distress, vital signs are reassuring and do not appear concerning for sepsis.  She does appear to have significant wound to her left lateral lower leg with purulent drainage and surrounding cellulitis, but no evidence of abscess.  We will check x-ray for evidence of osteomyelitis, start patient on ceftriaxone and vancomycin for purulent cellulitis.  Labs are pending, anticipate admission given extensive cellulitis.  Left tib-fib x-ray is unremarkable, labs are remarkable for mild leukopenia with improvement in prior anemia, no significant electrolyte abnormality noted but she does have a mild AKI.  Case discussed with hospitalist for admission for further management of wound infection and cellulitis.      FINAL CLINICAL IMPRESSION(S) / ED DIAGNOSES   Final diagnoses:  Wound infection  Cellulitis of left lower extremity     Rx / DC Orders   ED Discharge Orders     None        Note:  This document was prepared using Dragon voice recognition software and may include unintentional dictation errors.   Chesley Noon, MD 11/02/21 [phone]

## 2021-11-02 NOTE — H&P (Addendum)
History and Physical    Patient: [name] [name] CZY:[id number] DOB: [birth year] DOA: 11/02/2021 DOS: the patient was seen and examined on 11/02/2021 PCP: Leonel Ramsay, MD  Patient coming from: Home  Chief Complaint:  Chief Complaint  Patient presents with   Wound Infection   HPI: [name] [name] is a 88 y.o. female with medical history significant of dementia, bilateral lymphedema, previous DVT not on Eliquis, hypertension, who presents to the ED with complaints of left lower extremity wound and redness.  History limited by patient's dementia.  Collateral history obtained through chart review and from patient's son, [name].  [name] states that patient has had the wound on the left lower calf for several weeks now but he has noted a significant worsening over the last 2 weeks.  He particularly noticed that the wound seemed to enlarge.  He is not sure when it began to turn red.  He noticed purulent drainage approximately 1.5 weeks ago.  He denies noticing any fever, chills, nausea, vomiting, shortness of breath, chest pain.  He states patient was previously seeing wound care, however last visit was over 2 months ago.  Ms. Cyphers states that she is experiencing pain in her left lower leg that is worsened with palpation.  She denies any other discomfort at this time and states she is otherwise feeling well.  ED course: On arrival to the ED, patient was afebrile at 98 with blood pressure 138/80 and heart rate of 58.  She was saturating at 98% on room air.  Initial work-up remarkable for leukopenia with white blood count of 3.8, hemoglobin of 9.8.  Tib-fib x-ray obtained, negative for osteomyelitis.  TRH contacted for admission left lower extremity wound complicated by cellulitis.  Review of Systems: As mentioned in the history of present illness. All other systems reviewed and are negative. Past Medical History:  Diagnosis Date   Cellulitis 10/28/2016   bilateral lower extremities.     Past Surgical History:  Procedure Laterality Date   ABDOMINAL HYSTERECTOMY     GANGLION CYST EXCISION     LOWER EXTREMITY VENOGRAPHY Left 12/14/2020   Procedure: LOWER EXTREMITY VENOGRAPHY;  Surgeon: Algernon Huxley, MD;  Location: [place] [facility];  Service: Cardiovascular;  Laterality: Left;   Social History:  reports that she has never smoked. She has never used smokeless tobacco. She reports that she does not drink alcohol and does not use drugs.  No Known Allergies  Family History  Problem Relation Age of Onset   Diabetes Mellitus II Mother    Heart disease Father     Prior to Admission medications   Medication Sig Start Date End Date Taking? Authorizing Provider  acetaminophen (TYLENOL) 500 MG tablet Take 1 tablet (500 mg total) by mouth every 6 (six) hours as needed for mild pain or headache (fever >/= 101). 10/19/20   Cherene Altes, MD  apixaban (ELIQUIS) 5 MG TABS tablet Take 2 tablets (10 mg total) by mouth 2 (two) times daily for 5 days, THEN 1 tablet (5 mg total) 2 (two) times daily. 12/16/20 01/20/21  Richarda Osmond, MD  feeding supplement (ENSURE ENLIVE / ENSURE PLUS) LIQD Take 237 mLs by mouth 3 (three) times daily between meals. 11/10/20   Max Sane, MD  polyethylene glycol (MIRALAX / GLYCOLAX) 17 g packet Take 17 g by mouth 2 (two) times daily. 10/19/20   Cherene Altes, MD    Physical Exam: Vitals:   11/02/21 1308 11/02/21 1311 11/02/21 1500 11/02/21 1629  BP:  138/80 (!) 150/114 (!) 159/79  Pulse:  62  (!) 59  Resp:  _0 Temp:  98 F (36.7 C)  (!) 97.5 F (36.4 C)  TempSrc:  Oral    SpO2:  98%  100%  Weight: 89 kg     Height: 5' 6" (1.676 m)      Physical Exam Vitals and nursing note reviewed.  Constitutional:      General: She is not in acute distress.    Appearance: She is normal weight.  HENT:     Head: Normocephalic and atraumatic.     Mouth/Throat:     Mouth: Mucous membranes are moist.     Pharynx: Oropharynx is clear.   Eyes:     Conjunctiva/sclera: Conjunctivae normal.     Pupils: Pupils are equal, round, and reactive to light.  Cardiovascular:     Rate and Rhythm: Normal rate and regular rhythm.     Heart sounds: No murmur heard. Pulmonary:     Effort: Pulmonary effort is normal. No respiratory distress.     Breath sounds: Normal breath sounds.  Abdominal:     General: Bowel sounds are normal. There is no distension.     Palpations: Abdomen is soft.     Tenderness: There is no abdominal tenderness. There is no guarding.  Musculoskeletal:     Right lower leg: Swelling present. No tenderness.     Left lower leg: Swelling and tenderness present.     Comments: Bilateral, equal lower extremity edema, nonpitting, consistent with patient's history of lymphedema.  Skin:    General: Skin is warm and dry.     Findings: Erythema and wound (Approximately 5 cm circular ulcer present on the left lower extremity with surrounding erythema extending up to the knee and to the ankle.  Tenderness to palpation.  Purulent drainage present.) present.  Neurological:     Mental Status: She is alert.     Comments: Patient oriented to person and place, not time.  Psychiatric:        Mood and Affect: Mood normal.        Behavior: Behavior normal.        Data Reviewed: CBC notable for white blood count of 3.8, hemoglobin 9.8.  CMP remarkable for glucose of 105, BUN of 27, creatinine of 1.05 with GFR 50.  ESR elevated at 52.  Tib-fib x-ray negative for osteomyelitis.  Results are pending, will review when available.  Assessment and Plan: * Leg ulcer ([place]) Patient presenting with several week history of left lower extremity ulcer with significant worsening over the last 2 weeks including enlarging and purulent drainage.  Now complicated by surrounding cellulitis.  X-ray obtained and negative for osteomyelitis, however given elevated inflammatory markers, will obtain MRI for definitive evaluation.  ABIs obtained and  consistent with mild to moderate PAD.  Due to this, most likely venous ulcer.   - Continue ceftriaxone and vancomycin - MRI pending - Pending MRI results, consider general surgery versus orthopedic surgery consultation - Wound care consulted  Cellulitis In the setting of infected lower extremity ulcer.  - Continue ceftriaxone and vancomycin  AKI (acute kidney injury) ([place]) Mildly elevated creatinine at 1.05.  Previously between 0.6 and 0.9.Marland Kitchen  Given patient's underlying dementia, I am uncertain if she is keeping up with fluid intake.  We will gently rehydrate and recheck BMP in the morning.  - LR at 100 cc/hr for 12 hours  - Recheck BMP in the morning  Dementia ([place])  Patient is oriented to self and location as well as situation but not to time. She is uncertain where she lives her whom she lives with.  I discussed this with patient's son, [name], with whom Mrs. Gillham lives with.  She states he states this is patient's baseline.  Deep vein thrombosis (DVT) of left lower extremity ([place]) - Restart home Eliquis   Advance Care Planning:   Code Status: DNR.  Discussed CODE STATUS with patient's son given patient's underlying dementia and disorientation.  He states he has spoken with family in the past regarding their wishes in the case of an emergency.  Given patient's advanced age and dementia, they have decided to opt for DNR/DNI status.  Consults: Wound care  Family Communication: Patient's son [name] updated via telephone  Severity of Illness: The appropriate patient status for this patient is OBSERVATION. Observation status is judged to be reasonable and necessary in order to provide the required intensity of service to ensure the patient's safety. The patient's presenting symptoms, physical exam findings, and initial radiographic and laboratory data in the context of their medical condition is felt to place them at decreased risk for further clinical deterioration. Furthermore, it is  anticipated that the patient will be medically stable for discharge from the hospital within 2 midnights of admission.   Author: Jose Persia, MD 11/02/2021 8:11 PM  For on call review [email or web address].

## 2021-11-02 NOTE — Assessment & Plan Note (Signed)
-   Restart home Eliquis 

## 2021-11-02 NOTE — Progress Notes (Signed)
PHARMACIST - PHYSICIAN COMMUNICATION  CONCERNING:  Enoxaparin (Lovenox) for DVT Prophylaxis   RECOMMENDATION: Patient was prescribed enoxaparin 40mg  q24 hours for VTE prophylaxis.   Filed Weights   11/02/21 1308  Weight: 89 kg (196 lb 3.4 oz)    Body mass index is 31.67 kg/m.  Estimated Creatinine Clearance: 37.6 mL/min (A) (by C-G formula based on SCr of 1.05 mg/dL (H)).  Based on [place] patient is candidate for enoxaparin 0.5mg /kg TBW SQ every 24 hours based on BMI being >30.  DESCRIPTION: Pharmacy has adjusted enoxaparin dose per [facility] policy.  Patient is now receiving enoxaparin 45 mg every 24 hours   Benita Gutter 11/02/2021 6:49 PM

## 2021-11-02 NOTE — ED Triage Notes (Signed)
Pt via EMS from home. Pt states that her leg has been red and hot to the touch. Denies any pain. States it does not hurt but it has been red. Pt here for possible cellulitis. Family also concerned for a UTI pt not having any UTI symptoms but family report foul smelling urine. Pt is A&OX4 and NAD

## 2021-11-03 ENCOUNTER — Observation Stay: Payer: Medicare HMO

## 2021-11-03 DIAGNOSIS — I82402 Acute embolism and thrombosis of unspecified deep veins of left lower extremity: Secondary | ICD-10-CM | POA: Diagnosis not present

## 2021-11-03 DIAGNOSIS — F02C0 Dementia in other diseases classified elsewhere, severe, without behavioral disturbance, psychotic disturbance, mood disturbance, and anxiety: Secondary | ICD-10-CM

## 2021-11-03 DIAGNOSIS — Z8679 Personal history of other diseases of the circulatory system: Secondary | ICD-10-CM | POA: Diagnosis not present

## 2021-11-03 DIAGNOSIS — L97901 Non-pressure chronic ulcer of unspecified part of unspecified lower leg limited to breakdown of skin: Secondary | ICD-10-CM

## 2021-11-03 DIAGNOSIS — R600 Localized edema: Secondary | ICD-10-CM | POA: Diagnosis not present

## 2021-11-03 DIAGNOSIS — N179 Acute kidney failure, unspecified: Secondary | ICD-10-CM | POA: Diagnosis not present

## 2021-11-03 DIAGNOSIS — L03116 Cellulitis of left lower limb: Secondary | ICD-10-CM | POA: Diagnosis not present

## 2021-11-03 DIAGNOSIS — L97902 Non-pressure chronic ulcer of unspecified part of unspecified lower leg with fat layer exposed: Secondary | ICD-10-CM | POA: Diagnosis not present

## 2021-11-03 LAB — CBC WITH DIFFERENTIAL/PLATELET
Abs Immature Granulocytes: 0.01 10*3/uL (ref 0.00–0.07)
Basophils Absolute: 0 10*3/uL (ref 0.0–0.1)
Basophils Relative: 1 %
Eosinophils Absolute: 0.3 10*3/uL (ref 0.0–0.5)
Eosinophils Relative: 6 %
HCT: 26.3 % — ABNORMAL LOW (ref 36.0–46.0)
Hemoglobin: 8.1 g/dL — ABNORMAL LOW (ref 12.0–15.0)
Immature Granulocytes: 0 %
Lymphocytes Relative: 28 %
Lymphs Abs: 1.3 10*3/uL (ref 0.7–4.0)
MCH: 29.2 pg (ref 26.0–34.0)
MCHC: 30.8 g/dL (ref 30.0–36.0)
MCV: 94.9 fL (ref 80.0–100.0)
Monocytes Absolute: 0.5 10*3/uL (ref 0.1–1.0)
Monocytes Relative: 10 %
Neutro Abs: 2.5 10*3/uL (ref 1.7–7.7)
Neutrophils Relative %: 55 %
Platelets: 249 10*3/uL (ref 150–400)
RBC: 2.77 MIL/uL — ABNORMAL LOW (ref 3.87–5.11)
RDW: 13.8 % (ref 11.5–15.5)
WBC: 4.5 10*3/uL (ref 4.0–10.5)
nRBC: 0 % (ref 0.0–0.2)

## 2021-11-03 LAB — BASIC METABOLIC PANEL
Anion gap: 4 — ABNORMAL LOW (ref 5–15)
BUN: 26 mg/dL — ABNORMAL HIGH (ref 8–23)
CO2: 26 mmol/L (ref 22–32)
Calcium: 8.4 mg/dL — ABNORMAL LOW (ref 8.9–10.3)
Chloride: 109 mmol/L (ref 98–111)
Creatinine, Ser: 0.98 mg/dL (ref 0.44–1.00)
GFR, Estimated: 54 mL/min — ABNORMAL LOW (ref >60)
Glucose, Bld: 81 mg/dL (ref 70–99)
Potassium: 4.9 mmol/L (ref 3.5–5.1)
Sodium: 139 mmol/L (ref 135–145)

## 2021-11-03 MED ORDER — GADOBUTROL 1 MMOL/ML IV SOLN
7.0000 mL | Freq: Once | INTRAVENOUS | Status: AC | PRN
  Administered 2021-11-03: 7 mL via INTRAVENOUS

## 2021-11-03 MED ORDER — ZINC SULFATE 220 (50 ZN) MG PO CAPS
220.0000 mg | ORAL_CAPSULE | Freq: Every day | ORAL | Status: DC
  Administered 2021-11-03 – 2021-11-04 (×2): 220 mg via ORAL
  Filled 2021-11-03 (×2): qty 1

## 2021-11-03 MED ORDER — CEPHALEXIN 500 MG PO CAPS
500.0000 mg | ORAL_CAPSULE | Freq: Four times a day (QID) | ORAL | Status: DC
  Administered 2021-11-04: 500 mg via ORAL
  Filled 2021-11-03: qty 1

## 2021-11-03 MED ORDER — LORAZEPAM 2 MG/ML IJ SOLN
0.5000 mg | Freq: Once | INTRAMUSCULAR | Status: AC | PRN
  Administered 2021-11-03: 0.5 mg via INTRAVENOUS
  Filled 2021-11-03: qty 1

## 2021-11-03 MED ORDER — VITAMIN C 500 MG PO TABS
500.0000 mg | ORAL_TABLET | Freq: Two times a day (BID) | ORAL | Status: DC
  Administered 2021-11-03 – 2021-11-04 (×2): 500 mg via ORAL
  Filled 2021-11-03 (×2): qty 1

## 2021-11-03 NOTE — Evaluation (Signed)
Physical Therapy Evaluation Patient Details Name: [name] [name] MRN: [id number] DOB: 1926-09-25 Today's Date: 11/03/2021  History of Present Illness  [name] [name] is a 88 y.o. female with medical history significant of dementia, bilateral lymphedema, previous DVT not on Eliquis, hypertension, who presents to the ED with complaints of left lower extremity wound and redness.  History limited by patient's dementia.  Collateral history obtained through chart review and from patient's son, [name].   Clinical Impression  Pt admitted with above diagnosis. Pt received upright sleeping in bed, pt easily awakens to voice and agreeable to PT eval. Pt pleasant, appropriate conversation. Aware in hospital for LLE wound and denying pain. Pt with baseline dementia, is unreliable historian thus reliant on EMR for baseline mobility, home set up, assist needs, etc.  At baseline pt lives with son and step pivot transfers from surfaces with RW or 4WW with son's assistance. At baseline pt reliant on assist for ADL's/IADL's.   To date, pt performs supine to sit with supervision, needs assit donning socks. Pt stands with minA at [facility] with VC"s for hand placement and minA for step pivot and RW sequencing to recliner. Noted BM mid transfer warranting pt assisted to recliner. RN notified and enters room to assist PT. Pt transfers same level of assist recliner <> BSC. Unable to void bowel/bladder. In standing, dependent on RN for perihygiene. PT able to stand > 1 min with UE's supported on BSC arm rests with SBA with good stability and still return with step pivot BSC <> recliner. Pt's needs in reach with LE's elevated. Pt appears close to baseline function if not at baseline function. Will keep on PT caseload to continue transfer training to prevent functional decline and help reduce caregiver burden but at this point in time no f/u PT needs recommended. Pt currently with functional limitations due to the deficits listed below  (see PT Problem List). Pt will benefit from skilled PT to increase their independence and safety with mobility to allow discharge to the venue listed below.     Recommendations for follow up therapy are one component of a multi-disciplinary discharge planning process, led by the attending physician.  Recommendations may be updated based on patient status, additional functional criteria and insurance authorization.  Follow Up Recommendations No PT follow up      Assistance Recommended at Discharge Frequent or constant Supervision/Assistance  Patient can return home with the following  A lot of help with walking and/or transfers;A lot of help with bathing/dressing/bathroom;Assist for transportation;Help with stairs or ramp for entrance    Equipment Recommendations None recommended by PT  Recommendations for Other Services       Functional Status Assessment Patient has had a recent decline in their functional status and demonstrates the ability to make significant improvements in function in a reasonable and predictable amount of time.     Precautions / Restrictions Precautions Precautions: Fall Restrictions Weight Bearing Restrictions: No      Mobility  Bed Mobility Overal bed mobility: Needs Assistance Bed Mobility: Supine to Sit     Supine to sit: Supervision, HOB elevated     General bed mobility comments: increeased time, no need for UE's pn bed rails, excellent core engagement to bring torso off of bed into long sitting Patient Response: Cooperative  Transfers Overall transfer level: Needs assistance Equipment used: Rolling walker (2 wheels) Transfers: Bed to chair/wheelchair/BSC     Step pivot transfers: Min assist       General transfer comment: Bed <>  recliner <> BSC <> recliner. All minA, minA on RW for sequencing turns. Not able to fully perform hip and knee extension to stay upright. Heavy reliance on RW.    Ambulation/Gait               General  Gait Details: Does not ambulate at baseline  Stairs            Wheelchair Mobility    Modified Rankin (Stroke Patients Only)       Balance Overall balance assessment: Needs assistance Sitting-balance support: Bilateral upper extremity supported, Feet supported Sitting balance-Leahy Scale: Good       Standing balance-Leahy Scale: Poor Standing balance comment: heavy UE reliance on RW but performing at or near baseline level                             Pertinent Vitals/Pain Pain Assessment Pain Assessment: No/denies pain    Home Living Family/patient expects to be discharged to:: Private residence Living Arrangements: Children;Other relatives Available Help at Discharge: Family Type of Home: House           Home Equipment: Conservation officer, nature (2 wheels);Rollator (4 wheels);Wheelchair - manual Additional Comments: Due to baseline dementia, reliant on previous admissions and EMR for information    Prior Function Prior Level of Function : Needs assist       Physical Assist : Mobility (physical) Mobility (physical): Bed mobility;Transfers   Mobility Comments: Per chart review, pt's baseline is step pivot transfers with RW or 4WW with assist from son ADLs Comments: Requires assist from son with all ADL's like bathing, dressing, toileting     Hand Dominance        Extremity/Trunk Assessment   Upper Extremity Assessment Upper Extremity Assessment: Generalized weakness    Lower Extremity Assessment Lower Extremity Assessment: Generalized weakness       Communication   Communication: No difficulties  Cognition Arousal/Alertness: Awake/alert Behavior During Therapy: WFL for tasks assessed/performed Overall Cognitive Status: History of cognitive impairments - at baseline                                 General Comments: Very pleasant and cooperative. Knows she is at the hospital for her LE wound. Wanting to get up out of bed         General Comments      Exercises Other Exercises Other Exercises: Role of PT in acute setting, safe use of DME and safe use of hands for transfers   Assessment/Plan    PT Assessment Patient needs continued PT services  PT Problem List Decreased strength;Decreased safety awareness;Decreased skin integrity;Decreased cognition;Decreased balance;Decreased knowledge of use of DME       PT Treatment Interventions DME instruction;Therapeutic exercise;Balance training;Neuromuscular re-education;Functional mobility training;Therapeutic activities;Patient/family education    PT Goals (Current goals can be found in the Care Plan section)  Acute Rehab PT Goals PT Goal Formulation: Patient unable to participate in goal setting    Frequency Min 2X/week     Co-evaluation               AM-PAC PT "6 Clicks" Mobility  Outcome Measure Help needed turning from your back to your side while in a flat bed without using bedrails?: None Help needed moving from lying on your back to sitting on the side of a flat bed without using bedrails?: A Little Help needed moving  to and from a bed to a chair (including a wheelchair)?: A Little Help needed standing up from a chair using your arms (e.g., wheelchair or bedside chair)?: A Little Help needed to walk in hospital room?: Total Help needed climbing 3-5 steps with a railing? : Total 6 Click Score: 15    End of Session Equipment Utilized During Treatment: Gait belt Activity Tolerance: Patient tolerated treatment well Patient left: in chair;with call bell/phone within reach;with chair alarm set Nurse Communication: Mobility status PT Visit Diagnosis: Other abnormalities of gait and mobility (R26.89);Muscle weakness (generalized) (M62.81);Difficulty in walking, not elsewhere classified (R26.2)    Time: [id number] PT Time Calculation (min) (ACUTE ONLY): 32 min   Charges:   PT Evaluation $PT Eval Low Complexity: 1 Low PT  Treatments $Therapeutic Activity: 8-22 mins        Margi Edmundson M. Fairly IV, PT, DPT Physical Therapist- [facility]  11/03/2021, 2:40 PM

## 2021-11-03 NOTE — Evaluation (Signed)
Occupational Therapy Evaluation Patient Details Name: [name] [name] MRN: [id number] DOB: 11-08-1926 Today's Date: 11/03/2021   History of Present Illness [name] is a 88 y.o. female with medical history significant of dementia, bilateral lymphedema, previous DVT not on Eliquis, hypertension, who presents to the ED with complaints of left lower extremity wound and redness.  History limited by patient's dementia.  Collateral history obtained through chart review and from patient's son, [name] [name].   Clinical Impression   Patient seen for OT evaluation. Oriented to self only, however, followed single step commands well. Pt is a poor historian due to baseline dementia. Reliant on chart review for home set up, PLOF, etc. At baseline pt lives with son who provides assistance for all ADLs and IADLs. For mobility, pt normally completes step pivot transfers from surfaces using RW or 4WW with son's assistance. Pt currently functioning at set up-supervision for seated grooming and dressing tasks. Pt unable to stand this date despite Max A and VC for upright posture. Pt appears close to baseline level of function with ADLs. Will keep on OT caseload to continue ADL training in order to prevent functional decline and reduce caregiver burden. No follow up OT needs recommended at this time.      Recommendations for follow up therapy are one component of a multi-disciplinary discharge planning process, led by the attending physician.  Recommendations may be updated based on patient status, additional functional criteria and insurance authorization.   Follow Up Recommendations  No OT follow up    Assistance Recommended at Discharge Frequent or constant Supervision/Assistance  Patient can return home with the following A lot of help with walking and/or transfers;A lot of help with bathing/dressing/bathroom;Assist for transportation;Help with stairs or ramp for entrance;Direct supervision/assist for medications  management;Direct supervision/assist for financial management;Assistance with cooking/housework    Functional Status Assessment  Patient has had a recent decline in their functional status and demonstrates the ability to make significant improvements in function in a reasonable and predictable amount of time.  Equipment Recommendations  Other (comment) (TBD)    Recommendations for Other Services       Precautions / Restrictions Precautions Precautions: Fall Restrictions Weight Bearing Restrictions: No      Mobility Bed Mobility               General bed mobility comments: NT, pt received/left in recliner    Transfers Overall transfer level: Needs assistance Equipment used: Rolling walker (2 wheels) Transfers: Sit to/from Stand Sit to Stand: Max assist           General transfer comment: 2x attempts for STS unsuccessful despite Max A and VC, unable to come to fully upright standing position. Deferred getting back in bed.      Balance Overall balance assessment: Needs assistance Sitting-balance support: Bilateral upper extremity supported, Feet supported Sitting balance-Leahy Scale: Good         Standing balance comment: unable to stand this date                           ADL either performed or assessed with clinical judgement   ADL Overall ADL's : At baseline;Needs assistance/impaired     Grooming: Wash/dry face;Set up;Supervision/safety;Sitting           Upper Body Dressing : Sitting;Cueing for sequencing;Set up;Supervision/safety Upper Body Dressing Details (indicate cue type and reason): to doff/don gown Lower Body Dressing: Set up;Supervision/safety;Sitting/lateral leans;Cueing for sequencing Lower Body Dressing Details (indicate cue  type and reason): to don socks, increased time               General ADL Comments: Unable to assess ADLs in standing due to baseline mobility difficulties. Appears to be at baseline for ADL tasks.      Vision Baseline Vision/History: 1 Wears glasses (reading only) Patient Visual Report: No change from baseline       Perception     Praxis      Pertinent Vitals/Pain Pain Assessment Pain Assessment: No/denies pain     Hand Dominance     Extremity/Trunk Assessment Upper Extremity Assessment Upper Extremity Assessment: Generalized weakness   Lower Extremity Assessment Lower Extremity Assessment: Generalized weakness       Communication Communication Communication: No difficulties   Cognition Arousal/Alertness: Awake/alert Behavior During Therapy: WFL for tasks assessed/performed Overall Cognitive Status: History of cognitive impairments - at baseline                                 General Comments: Oriented to self, [place]. Very pleasant and cooperative. Tangential with conversation requiring VC to redirect.     General Comments  BLE edema, dressing C/D/I on LLE    Exercises Other Exercises Other Exercises: OT provided education re: role of OT, OT POC, post acute recs, sitting up for all meals, EOB/OOB mobility with assistance, home/fall safety.     Shoulder Instructions      Home Living Family/patient expects to be discharged to:: Private residence Living Arrangements: Children;Other relatives Available Help at Discharge: Family Type of Home: House                       Home Equipment: Conservation officer, nature (2 wheels);Rollator (4 wheels);Wheelchair - manual   Additional Comments: Due to baseline dementia, reliant on previous admissions and EMR for information      Prior Functioning/Environment Prior Level of Function : Needs assist  Cognitive Assist : Mobility (cognitive);ADLs (cognitive)     Physical Assist : Mobility (physical);ADLs (physical) Mobility (physical): Bed mobility;Transfers ADLs (physical): Grooming;Bathing;Dressing;Toileting;IADLs Mobility Comments: Per chart review, pt's baseline is step pivot transfers with  RW or 4WW with assist from son ADLs Comments: Requires assist from son with all ADLs/IADLs        OT Problem List: Decreased strength;Decreased activity tolerance;Impaired balance (sitting and/or standing);Decreased cognition;Decreased safety awareness;Increased edema;Decreased knowledge of use of DME or AE      OT Treatment/Interventions: Self-care/ADL training;Therapeutic exercise;Patient/family education;Balance training;Energy conservation;Cognitive remediation/compensation;Therapeutic activities;DME and/or AE instruction    OT Goals(Current goals can be found in the care plan section) Acute Rehab OT Goals Patient Stated Goal: none stated OT Goal Formulation: Patient unable to participate in goal setting Time For Goal Achievement: 11/17/21   OT Frequency: Min 1X/week    Co-evaluation              AM-PAC OT "6 Clicks" Daily Activity     Outcome Measure Help from another person eating meals?: A Little Help from another person taking care of personal grooming?: A Little Help from another person toileting, which includes using toliet, bedpan, or urinal?: A Lot Help from another person bathing (including washing, rinsing, drying)?: A Lot Help from another person to put on and taking off regular upper body clothing?: A Little Help from another person to put on and taking off regular lower body clothing?: A Little 6 Click Score: 16   End of Session Equipment  Utilized During Treatment: Rolling walker (2 wheels);Gait belt Nurse Communication: Mobility status  Activity Tolerance: Patient tolerated treatment well Patient left: in chair;with call bell/phone within reach;with chair alarm set  OT Visit Diagnosis: Other abnormalities of gait and mobility (R26.89);Muscle weakness (generalized) (M62.81);Other symptoms and signs involving cognitive function                Time: 3559-7416 OT Time Calculation (min): 28 min Charges:  OT General Charges $OT Visit: 1 Visit OT  Evaluation $OT Eval Low Complexity: 1 Low  [facility] MS, OTR/L ascom [phone]  11/03/21, 5:55 PM

## 2021-11-03 NOTE — Plan of Care (Signed)

## 2021-11-03 NOTE — Progress Notes (Signed)
       CROSS COVER NOTE  NAME: [name] [name] MRN: [id number] DOB : 30-Oct-1926 ATTENDING PHYSICIAN: Jose Persia, MD    Date of Service   11/03/2021   HPI/Events of Note   Medication request received to aid in compliance with lying still while in MRI.  Interventions   Assessment/Plan:  Lorazepam x1     This document was prepared using Dragon voice recognition software and may include unintentional dictation errors.  Neomia Glass DNP, MBA, FNP-BC Nurse Practitioner [facility] Pager [phone]

## 2021-11-03 NOTE — Progress Notes (Signed)
Initial Nutrition Assessment  DOCUMENTATION CODES:   Obesity unspecified  INTERVENTION:   -Continue Ensure Enlive po TID, each supplement provides 350 kcal and 20 grams of protein -MVI with minerals daily -500 mg vitamin C BID -220 mg zinc sulfate daily x 14 days  NUTRITION DIAGNOSIS:   Increased nutrient needs related to wound healing as evidenced by estimated needs.  GOAL:   Patient will meet greater than or equal to 90% of their needs  MONITOR:   Supplement acceptance, PO intake  REASON FOR ASSESSMENT:   Consult Assessment of nutrition requirement/status, Wound healing  ASSESSMENT:   Pt with medical history significant of dementia, bilateral lymphedema, previous DVT not on Eliquis, hypertension, who presents with complaints of left lower extremity wound and redness.  Pt admitted with lt leg ulcer and cellulitis.   Reviewed I/O's: +390 ml x 24 hours  Pt sitting up in bed at time of visit, pleasant and in good spirits. Pt unable to provide accurate history secondary to dementia. Pt shares that she has a good appetite both here and PTA. Pt is currently on a regular diet, but no meal completion data available to assess at this time.    Reviewed wt hx; no wt loss noted over the past year.   Discussed importance of good meal and supplement intake to promote healing. Pt amenable to supplements.   Medications reviewed.   Lab Results  Component Value Date   HGBA1C 5.1 11/02/2021   PTA DM medications are none.   Labs reviewed: CBGS: 106 (inpatient orders for glycemic control are none).    NUTRITION - FOCUSED PHYSICAL EXAM:  Flowsheet Row Most Recent Value  Orbital Region No depletion  Upper Arm Region No depletion  Thoracic and Lumbar Region No depletion  Buccal Region No depletion  Temple Region No depletion  Clavicle Bone Region No depletion  Clavicle and Acromion Bone Region No depletion  Scapular Bone Region No depletion  Dorsal Hand No depletion   Patellar Region No depletion  Anterior Thigh Region No depletion  Posterior Calf Region No depletion  Edema (RD Assessment) Moderate  Hair Reviewed  Eyes Reviewed  Mouth Reviewed  Skin Reviewed  Nails Reviewed       Diet Order:   Diet Order             Diet regular Room service appropriate? Yes; Fluid consistency: Thin  Diet effective now                   EDUCATION NEEDS:   Education needs have been addressed  Skin:  Skin Assessment: Skin Integrity Issues: Skin Integrity Issues:: Other (Comment) Other: non-pressurw wound lt lateral leg  Last BM:  11/03/21 (type 3)  Height:   Ht Readings from Last 1 Encounters:  11/02/21 5\' 6"  (1.676 m)    Weight:   Wt Readings from Last 1 Encounters:  11/02/21 89 kg    Ideal Body Weight:  59.1 kg  BMI:  Body mass index is 31.67 kg/m.  Estimated Nutritional Needs:   Kcal:  1850-2050  Protein:  105-120 grams  Fluid:  > 1.8 L    Loistine Chance, RD, LDN, [place] Registered Dietitian II Certified Diabetes Care and Education Specialist Please refer to [facility] for RD and/or RD on-call/weekend/after hours pager

## 2021-11-03 NOTE — Progress Notes (Signed)
PROGRESS NOTE  [name] [name]    DOB: 10-14-1926, 88 y.o.  PPI:[id number]    Code Status: DNR   DOA: 11/02/2021   LOS: 0   Brief hospital course  [name] [name] is a 88 y.o. female with a PMH significant for dementia, bilateral lymphedema, previous DVT not on Eliquis, hypertension.  They presented from home to the ED on 11/02/2021 with left lower extremity wound and redness worsening x 14 days. Associated with leg pain but she did not have fever or systemic symptoms.  In the ED, it was found that they had afebrile at 98 with blood pressure 138/80 and heart rate of 58.  She was saturating at 98% on room air.  Initial work-up remarkable for leukopenia with white blood count of 3.8, hemoglobin of 9.8.  Tib-fib x-ray obtained, negative for osteomyelitis.  Please see clinical images for further detail of initial wound from day of admission.   They were initially treated with CTX and vancomycin and MRI ordered.   Patient was admitted to medicine service for further workup and management of leg ulcer with concern for cellulitis vs osteomyelitis as outlined in detail below.  11/03/21 -patient is stable and remains afebrile. Under the care of wound care team  Assessment & Plan  Principal Problem:   Leg ulcer ([place]) Active Problems:   Cellulitis   AKI (acute kidney injury) ([place])   Dementia ([place])   Deep vein thrombosis (DVT) of left lower extremity ([place])  Leg ulcer ([place]) Cellulitis  Xray and MRI tib-fib both negative for signs of osteomyelitis or abscess. Presentation is most consistent with chronic and worsening venus stasis ulcer with possible cellulitis. Given absence of systemic symptoms and unremarkable imaging, will deescalate Abx at this time with discussion with pharmacy team.  - transition to keflex.  - WOC consulted, continue medihoney for medical debridement and follow up with wound center - PT/OT consult   AKI (acute kidney injury) ([place])- POA. resolved   Dementia  [facility]) Patient is oriented to self and location as well as situation but not to time. She is uncertain where she lives her whom she lives with.  I discussed this with patient's son, [name], with whom [name] [name] lives with.  She states he states this is patient's baseline.   Deep vein thrombosis (DVT) of left lower extremity ([facility]) - Restart home Eliquis  Body mass index is 31.67 kg/m.  VTE ppx:  apixaban (ELIQUIS) tablet 5 mg   Diet:     Diet   Diet regular Room service appropriate? Yes; Fluid consistency: Thin   Consultants: [place] Subjective 11/03/21    Pt reports no complaints today. She is not oriented to place or time. She is self-oriented and pleasant.   Objective   Vitals:   11/02/21 1500 11/02/21 1629 11/02/21 2317 11/03/21 0858  BP: (!) 150/114 (!) 159/79 (!) 115/53 (!) 103/55  Pulse:  (!) 59 70 (!) 57  Resp: 17 17 16 20   Temp:  (!) 97.5 F (36.4 C) 98.8 F (37.1 C) 97.8 F (36.6 C)  TempSrc:      SpO2:  100% 96% 98%  Weight:      Height:        Intake/Output Summary (Last 24 hours) at 11/03/2021 1205 Last data filed at 11/03/2021 0352 Gross per 24 hour  Intake 389.77 ml  Output --  Net 389.77 ml   Filed Weights   11/02/21 1308  Weight: 89 kg     Physical Exam:  General: awake, alert, NAD  HEENT: atraumatic, clear conjunctiva, anicteric sclera, MMM, hearing grossly normal Respiratory: normal respiratory effort. Cardiovascular: quick capillary refill Nervous: A&O x self only. normal speech Extremities: moves all equally, no edema, normal tone Skin: ulcer covered in clean, dry dressing. Clinical images in chart  Psychiatry: flat affect.   Labs   I have personally reviewed the following labs and imaging studies CBC    Component Value Date/Time   WBC 4.5 11/03/2021 0425   RBC 2.77 (L) 11/03/2021 0425   HGB 8.1 (L) 11/03/2021 0425   HCT 26.3 (L) 11/03/2021 0425   PLT 249 11/03/2021 0425   MCV 94.9 11/03/2021 0425   MCH 29.2 11/03/2021 0425    MCHC 30.8 11/03/2021 0425   RDW 13.8 11/03/2021 0425   LYMPHSABS 1.3 11/03/2021 0425   MONOABS 0.5 11/03/2021 0425   EOSABS 0.3 11/03/2021 0425   BASOSABS 0.0 11/03/2021 0425      Latest Ref Rng & Units 11/03/2021    4:25 AM 11/02/2021    2:09 PM 12/16/2020    1:48 AM  BMP  Glucose 70 - 99 mg/dL 81  833  825   BUN 8 - 23 mg/dL 26  27  17    Creatinine 0.44 - 1.00 mg/dL  0.53  9.76   Sodium 135 - 145 mmol/L 139  140  136   Potassium 3.5 - 5.1 mmol/L 4.9  4.8  4.4   Chloride 98 - 111 mmol/L 109  108  107   CO2 22 - 32 mmol/L 26  25  25    Calcium 8.9 - 10.3 mg/dL 8.4  9.3  8.4     MR TIBIA FIBULA LEFT W WO CONTRAST  Result Date: 11/03/2021 CLINICAL DATA:  Soft tissue infection, previous DVT, lymphedema EXAM: MRI OF LOWER LEFT EXTREMITY WITHOUT AND WITH CONTRAST TECHNIQUE: Multiplanar, multisequence MR imaging of the left lower leg was performed both before and after administration of intravenous contrast. CONTRAST:  62mL GADAVIST GADOBUTROL 1 MMOL/ML IV SOLN COMPARISON:  None Available. FINDINGS: Bones/Joint/Cartilage No fracture or dislocation. Normal alignment. No joint effusion. Severe osteoarthritis of the medial femorotibial compartment bilaterally. Moderate-severe osteoarthritis of the left lateral femorotibial compartment. Benign chondroid lesion in the proximal tibial metaphysis. Ligaments Collateral ligaments are intact. Muscles and Tendons Generalized muscle atrophy. No intramuscular fluid collection or hematoma. Mild muscle edema in the peroneal musculature likely neurogenic versus secondary to myositis. Soft tissue Circumferential soft tissue edema involving bilateral lower extremities consistent with lymphedema. No drainable fluid collection. No soft tissue mass. IMPRESSION: 1. Circumferential soft tissue edema involving bilateral lower extremities consistent with lymphedema. No drainable fluid collection to suggest an abscess. 2. Mild muscle edema in the left peroneal  musculature likely neurogenic versus secondary to myositis. 3. Severe osteoarthritis of the medial femorotibial compartment bilaterally. Moderate-severe osteoarthritis of the left lateral femorotibial compartment. Electronically Signed   By: 11/05/2021 M.D.   On: 11/03/2021 09:26   Elige Ko ARTERIAL ABI (SCREENING LOWER EXTREMITY)  Result Date: 11/03/2021 CLINICAL DATA:  Lower extremity ulceration EXAM: NONINVASIVE PHYSIOLOGIC VASCULAR STUDY OF BILATERAL LOWER EXTREMITIES TECHNIQUE: Evaluation of both lower extremities were performed at rest, including calculation of ankle-brachial indices with single level pressure measurements and doppler recording. COMPARISON:  None available. FINDINGS: Right ABI:  0.97 Left ABI:  0.90 Right Lower Extremity: Monophasic posterior tibial and dorsalis pedis artery waveforms. Left Lower Extremity: Monophasic posterior plantar soft tissues artery waveforms. 0.9-0.99 Borderline PAD IMPRESSION: ABI values indicate borderline/mild lower extremity arterial occlusive disease. However monophasic waveform seen bilaterally  at the ankle suggest high degree of stenosis. Further evaluation with CT angiography of the lower extremities to be beneficial to determine exact degree of stenosis. Electronically Signed   By: Acquanetta Belling M.D.   On: 11/03/2021 07:39   DG Tibia/Fibula Left  Result Date: 11/02/2021 CLINICAL DATA:  Rule out infection. EXAM: LEFT TIBIA AND FIBULA - 2 VIEW COMPARISON:  Left tib fib 11/07/2018 FINDINGS: Negative for fracture.  No evidence of osteomyelitis. Advanced degenerative change in the left knee with marked joint space narrowing and spurring medially. Mild widening of the joint space laterally. Mild arterial calcification. IMPRESSION: Advanced degenerative change in the left knee. No acute abnormality. Electronically Signed   By: Marlan Palau M.D.   On: 11/02/2021 14:26    Disposition Plan & Communication  Patient status: Observation  Admitted From:  Home Planned disposition location: TBD Anticipated discharge date: 10/26 pending safe disposition  Family Communication: son, [name] on telephone. Would like to pursue SNF   Author: Leeroy Bock, DO [facility] 11/03/2021, 12:05 PM   Available by Epic secure chat 7AM-7PM. If 7PM-7AM, please contact night-coverage.  [facility] contact information found on [email or web address].

## 2021-11-03 NOTE — Consult Note (Signed)
WOC consulted for wound debridement, see note from [facility] nursing 10/24. Secure chat with MD on topical use of Medihoney for debridement and its antimicrobial properties.  In the setting of mild PAD I would not suggest aggressive debridements, the wound is 50% slough/yellow and appears to be pretty adherent to the wound bed, therefore general surgery would have to be consulted if aggressive debridement desired. MRI does not indicate osteomyelitis. Recommended follow up/work up in a wound care center of the patient's choice for management of LE wound long term.    Re consult if needed, will not follow at this time. Thanks  Loralye Loberg [employer], RN,CWOCN, CNS, [place] [phone])

## 2021-11-03 NOTE — TOC Initial Note (Signed)
Transition of Care [facility]) - Initial/Assessment Note    Patient Details  Name: [name] [name] MRN: [id number] Date of Birth: 1926/10/10  [facility] of [facility]) CM/SW Contact:    Laurena Slimmer, RN Phone Number: 11/03/2021, 11:24 PM  Clinical Narrative:                  Transition of Care [facility]) Screening Note   Patient Details  Name: [name] [name] Date of Birth: 11-Sep-1926   [facility] of Care [facility]) CM/SW Contact:    Laurena Slimmer, RN Phone Number: 11/03/2021, 11:24 PM    Transition of Care [facility]) has reviewed patient and no TOC needs have been identified at this time. We will continue to monitor patient advancement through interdisciplinary progression rounds. If new patient transition needs arise, please place a TOC consult.          Patient Goals and CMS Choice        Expected Discharge Plan and Services                                                Prior Living Arrangements/Services                       Activities of Daily Living      Permission Sought/Granted                  Emotional Assessment              Admission diagnosis:  Cellulitis [L03.90] Wound infection [T14.8XXA, L08.9] Cellulitis of left lower extremity [L03.116] Patient Active Problem List   Diagnosis Date Noted   Leg ulcer ([place]) 11/02/2021   DVT (deep venous thrombosis) ([place]) 12/12/2020   HTN (hypertension) 12/12/2020   Blister of left foot 12/12/2020   Deep vein thrombosis (DVT) of left lower extremity ([place]) 12/12/2020   Leukocytosis    Malnutrition of moderate degree 11/09/2020   Pressure injury of skin 11/08/2020   Dementia ([place]) 11/07/2020   Lactic acidosis 11/07/2020   Superficial bruising of arm, left, initial encounter    Chronic diastolic CHF (congestive heart failure) ([place]) 04/06/2020   HTN (hypertension), malignant    Lymphedema    Fall    Stage 3b chronic kidney disease ([place])    B12 deficiency    Anemia due to  vitamin B12 deficiency    Acute metabolic encephalopathy    General weakness    Confusion    Acute UTI 01/07/2020   E. coli UTI 01/07/2020   Chronic venous stasis dermatitis of both lower extremities 01/07/2020   Cellulitis 03/06/2017   Left leg cellulitis 11/25/2016   AKI (acute kidney injury) ([place]) 11/05/2016   Decubitus ulcer of ischium, stage 2, left ([place]) 11/05/2016   Troponin I above reference range 11/04/2016   PCP:  Leonel Ramsay, MD Pharmacy:   [facility] [address]), [place] [address] [place]) [place] [address] Phone: [phone] Fax: [phone]     Social Determinants of Health (SDOH) Interventions    Readmission Risk Interventions     No data to display

## 2021-11-04 DIAGNOSIS — L089 Local infection of the skin and subcutaneous tissue, unspecified: Secondary | ICD-10-CM

## 2021-11-04 DIAGNOSIS — L97902 Non-pressure chronic ulcer of unspecified part of unspecified lower leg with fat layer exposed: Secondary | ICD-10-CM | POA: Diagnosis not present

## 2021-11-04 DIAGNOSIS — T148XXA Other injury of unspecified body region, initial encounter: Secondary | ICD-10-CM | POA: Diagnosis not present

## 2021-11-04 DIAGNOSIS — L03116 Cellulitis of left lower limb: Secondary | ICD-10-CM | POA: Diagnosis not present

## 2021-11-04 MED ORDER — CEPHALEXIN 500 MG PO CAPS: 500.0000 mg | ORAL_CAPSULE | Freq: Four times a day (QID) | ORAL | 0 refills | Status: AC

## 2021-11-04 MED ORDER — MEDIHONEY WOUND/BURN DRESSING EX PSTE: 1.0000 | PASTE | Freq: Every day | CUTANEOUS | 0 refills | Status: AC

## 2021-11-04 NOTE — Plan of Care (Signed)
  Problem: Education: Goal: Knowledge of General Education information will improve Description: Including pain rating scale, medication(s)/side effects and non-pharmacologic comfort measures Outcome: Adequate for Discharge   Problem: Health Behavior/Discharge Planning: Goal: Ability to manage health-related needs will improve Outcome: Adequate for Discharge   Problem: Clinical Measurements: Goal: Ability to maintain clinical measurements within normal limits will improve Outcome: Adequate for Discharge Goal: Will remain free from infection Outcome: Adequate for Discharge Goal: Diagnostic test results will improve Outcome: Adequate for Discharge Goal: Respiratory complications will improve Outcome: Adequate for Discharge Goal: Cardiovascular complication will be avoided Outcome: Adequate for Discharge   Problem: Activity: Goal: Risk for activity intolerance will decrease Outcome: Adequate for Discharge   Problem: Nutrition: Goal: Adequate nutrition will be maintained Outcome: Adequate for Discharge   Problem: Coping: Goal: Level of anxiety will decrease Outcome: Adequate for Discharge   Problem: Elimination: Goal: Will not experience complications related to bowel motility Outcome: Adequate for Discharge Goal: Will not experience complications related to urinary retention Outcome: Adequate for Discharge   Problem: Pain Managment: Goal: General experience of comfort will improve Outcome: Adequate for Discharge   Problem: Safety: Goal: Ability to remain free from injury will improve Outcome: Adequate for Discharge   Problem: Skin Integrity: Goal: Risk for impaired skin integrity will decrease Outcome: Adequate for Discharge   Problem: Acute Rehab PT Goals(only PT should resolve) Goal: Pt Will Transfer Bed To Chair/Chair To Bed Outcome: Adequate for Discharge   Problem: Increased Nutrient Needs (NI-5.1) Goal: Food and/or nutrient delivery Description:  Individualized approach for food/nutrient provision. Outcome: Adequate for Discharge   Problem: Acute Rehab OT Goals (only OT should resolve) Goal: Pt. Will Perform Grooming Outcome: Adequate for Discharge Goal: Pt. Will Perform Lower Body Dressing Outcome: Adequate for Discharge Goal: Pt. Will Transfer To Toilet Outcome: Adequate for Discharge Goal: Pt. Will Perform Toileting-Clothing Manipulation Outcome: Adequate for Discharge

## 2021-11-04 NOTE — Progress Notes (Signed)
Reviewed discharge instructions. Pt and spouse verbalized understanding. IV intact when removed. Staff wheeled pt out. Pt transported to home via family vehicle.

## 2021-11-04 NOTE — Progress Notes (Signed)
Physical Therapy Treatment Patient Details Name: [name] [name] MRN: [id number] DOB: 1926-07-12 Today's Date: 11/04/2021   History of Present Illness [name] [name] is a 88 y.o. female with medical history significant of dementia, bilateral lymphedema, previous DVT not on Eliquis, hypertension, who presents to the ED with complaints of left lower extremity wound and redness.  History limited by patient's dementia.  Collateral history obtained through chart review and from patient's son, [name] [name].   PT Comments    Patient agreeable to PT with encouragement. She was requesting to have more breakfast. She reports no pain. Max A required for standing from the chair with cues for hand placement on the chair and follow through of placement on rolling walker. Limited standing tolerance overall. The patient participated in LE exercises for strengthening. Recommend PT follow up while in the hospital.    Recommendations for follow up therapy are one component of a multi-disciplinary discharge planning process, led by the attending physician.  Recommendations may be updated based on patient status, additional functional criteria and insurance authorization.  Follow Up Recommendations  No PT follow up     Assistance Recommended at Discharge Frequent or constant Supervision/Assistance  Patient can return home with the following A lot of help with walking and/or transfers;A lot of help with bathing/dressing/bathroom;Assist for transportation;Help with stairs or ramp for entrance   Equipment Recommendations  None recommended by PT    Recommendations for Other Services       Precautions / Restrictions Precautions Precautions: Fall Restrictions Weight Bearing Restrictions: No     Mobility  Bed Mobility               General bed mobility comments: not assessed as patient sitting up on arrival and post session    Transfers Overall transfer level: Needs assistance Equipment used: Rolling  walker (2 wheels) Transfers: Sit to/from Stand Sit to Stand: Max assist           General transfer comment: significant assistance for lifting and anterior weight shifting. maximal cues for hand placement on the chair to stand and follow through of hand placement on rolling walker    Ambulation/Gait               General Gait Details: not attempted. per chart, patient is non ambulatory at baseline   Stairs             Wheelchair Mobility    Modified Rankin (Stroke Patients Only)       Balance Overall balance assessment: Needs assistance Sitting-balance support: Feet supported Sitting balance-Leahy Scale: Good     Standing balance support: Bilateral upper extremity supported Standing balance-Leahy Scale: Poor Standing balance comment: anterior weight shifting provided                            Cognition Arousal/Alertness: Awake/alert Behavior During Therapy: Agitated (intermittent agitation) Overall Cognitive Status: History of cognitive impairments - at baseline                                 General Comments: patient is distracted with wanting food. patient is able to follow single step commands with increased time and cues for attention to task        Exercises      General Comments        Pertinent Vitals/Pain Pain Assessment Pain Assessment: No/denies pain    Home Living  Prior Function            PT Goals (current goals can now be found in the care plan section) Acute Rehab PT Goals Patient Stated Goal: none stated PT Goal Formulation: Patient unable to participate in goal setting Progress towards PT goals: Progressing toward goals    Frequency    Min 2X/week      PT Plan Current plan remains appropriate    Co-evaluation              AM-PAC PT "6 Clicks" Mobility   Outcome Measure  Help needed turning from your back to your [name] while in a flat bed  without using bedrails?: None Help needed moving from lying on your back to sitting on the [name] of a flat bed without using bedrails?: A Little Help needed moving to and from a bed to a chair (including a wheelchair)?: A Lot Help needed standing up from a chair using your arms (e.g., wheelchair or bedside chair)?: A Lot Help needed to walk in hospital room?: Total Help needed climbing 3-5 steps with a railing? : Total 6 Click Score: 13    End of Session   Activity Tolerance: Patient tolerated treatment well Patient left: in chair;with call bell/phone within reach;with chair alarm set Nurse Communication: Mobility status (patient requesting to have more breakfast- alerted nurse) PT Visit Diagnosis: Other abnormalities of gait and mobility (R26.89);Muscle weakness (generalized) (M62.81);Difficulty in walking, not elsewhere classified (R26.2)     Time: 2992-4268 PT Time Calculation (min) (ACUTE ONLY): 16 min  Charges:  $Therapeutic Activity: 8-22 mins                     Donna Bernard, PT, MPT    Ina Homes 11/04/2021, 11:58 AM

## 2021-11-04 NOTE — Discharge Summary (Signed)
Physician Discharge Summary  Patient: [name] [name] FIE:[id number] DOB: [birth year]   Code Status: DNR Admit date: 11/02/2021 Discharge date: 11/04/2021 Disposition: Home, PT, OT, nurse aid, and RN PCP: Leonel Ramsay, MD  Recommendations for Outpatient Follow-up:  Follow up with PCP within 1-2 weeks   Discharge Diagnoses:  Principal Problem:   Leg ulcer ([place]) Active Problems:   Cellulitis   AKI (acute kidney injury) ([place])   Dementia ([place])   Deep vein thrombosis (DVT) of left lower extremity [facility])  Brief Hospital Course Summary: Pt presented from home to the ED on 11/02/2021 with left lower extremity wound and redness worsening x 14 days. Associated with leg pain but she did not have fever or systemic symptoms.   In the ED, it was found that they had afebrile at 98 with blood pressure 138/80 and heart rate of 58.  She was saturating at 98% on room air.  Initial work-up remarkable for leukopenia with white blood count of 3.8, hemoglobin of 9.8.  Tib-fib x-ray obtained, negative for osteomyelitis.  Please see clinical images for further detail of initial wound from day of admission.    They were initially treated with CTX and vancomycin and MRI ordered.   MRI negative for osteomyelitis or abscess. Continued keflex for cellulitis surround chronic venous stasis ulcer.  Patient remained stable and afebrile.  She was evaluated by PT/OT who recommended SNF at discharge but insurance denied. She was discharged home with her son who lives with her and maximum home health assistance was ordered.   Of note, she also suffered an AKI which resolved. She was continued on home eliquis for hx of DVT. Her mental status was at baseline, per son.  Discharge Condition: Stable, improved Recommended discharge diet: Regular healthy diet  Consultations: none  Procedures/Studies: Tib/fib MRI   Allergies as of 11/04/2021   No Known Allergies      Medication List     TAKE these  medications    acetaminophen 500 MG tablet Commonly known as: TYLENOL Take 1 tablet (500 mg total) by mouth every 6 (six) hours as needed for mild pain or headache (fever >/= 101).   apixaban 5 MG Tabs tablet Commonly known as: ELIQUIS Take 2 tablets (10 mg total) by mouth 2 (two) times daily for 5 days, THEN 1 tablet (5 mg total) 2 (two) times daily. Start taking on: December 16, 2020   cephALEXin 500 MG capsule Commonly known as: KEFLEX Take 1 capsule (500 mg total) by mouth every 6 (six) hours for 5 days.   feeding supplement Liqd Take 237 mLs by mouth 3 (three) times daily between meals.   leptospermum manuka honey Pste paste Apply 1 Application topically daily. Start taking on: November 05, 2021   polyethylene glycol 17 g packet Commonly known as: MIRALAX / GLYCOLAX Take 17 g by mouth 2 (two) times daily.         Subjective   Pt reports no complaints today.  Objective  Blood pressure 123/66, pulse 64, temperature 98 F (36.7 C), resp. rate 20, height 5\' 6"  (1.676 m), weight 89 kg, SpO2 97 %.   General: Pt is alert, awake, not in acute distress Cardiovascular: RRR, S1/S2 +, no rubs, no gallops Respiratory: CTA bilaterally, no wheezing, no rhonchi Abdominal: Soft, NT, ND, bowel sounds + Extremities: no edema, no cyanosis. Warm to touch. Pedal pulse palpable. Wound wrapped in clean, dry dressing.   The results of significant diagnostics from this hospitalization (including imaging, microbiology, ancillary  and laboratory) are listed below for reference.   Imaging studies: MR TIBIA FIBULA LEFT W WO CONTRAST  Result Date: 11/03/2021 CLINICAL DATA:  Soft tissue infection, previous DVT, lymphedema EXAM: MRI OF LOWER LEFT EXTREMITY WITHOUT AND WITH CONTRAST TECHNIQUE: Multiplanar, multisequence MR imaging of the left lower leg was performed both before and after administration of intravenous contrast. CONTRAST:  49mL GADAVIST GADOBUTROL 1 MMOL/ML IV SOLN COMPARISON:  None  Available. FINDINGS: Bones/Joint/Cartilage No fracture or dislocation. Normal alignment. No joint effusion. Severe osteoarthritis of the medial femorotibial compartment bilaterally. Moderate-severe osteoarthritis of the left lateral femorotibial compartment. Benign chondroid lesion in the proximal tibial metaphysis. Ligaments Collateral ligaments are intact. Muscles and Tendons Generalized muscle atrophy. No intramuscular fluid collection or hematoma. Mild muscle edema in the peroneal musculature likely neurogenic versus secondary to myositis. Soft tissue Circumferential soft tissue edema involving bilateral lower extremities consistent with lymphedema. No drainable fluid collection. No soft tissue mass. IMPRESSION: 1. Circumferential soft tissue edema involving bilateral lower extremities consistent with lymphedema. No drainable fluid collection to suggest an abscess. 2. Mild muscle edema in the left peroneal musculature likely neurogenic versus secondary to myositis. 3. Severe osteoarthritis of the medial femorotibial compartment bilaterally. Moderate-severe osteoarthritis of the left lateral femorotibial compartment. Electronically Signed   By: Elige Ko M.D.   On: 11/03/2021 09:26   US ARTERIAL ABI (SCREENING LOWER EXTREMITY)  Result Date: 11/03/2021 CLINICAL DATA:  Lower extremity ulceration EXAM: NONINVASIVE PHYSIOLOGIC VASCULAR STUDY OF BILATERAL LOWER EXTREMITIES TECHNIQUE: Evaluation of both lower extremities were performed at rest, including calculation of ankle-brachial indices with single level pressure measurements and doppler recording. COMPARISON:  None available. FINDINGS: Right ABI:  0.97 Left ABI:  0.90 Right Lower Extremity: Monophasic posterior tibial and dorsalis pedis artery waveforms. Left Lower Extremity: Monophasic posterior plantar soft tissues artery waveforms. 0.9-0.99 Borderline PAD IMPRESSION: ABI values indicate borderline/mild lower extremity arterial occlusive disease.  However monophasic waveform seen bilaterally at the ankle suggest high degree of stenosis. Further evaluation with CT angiography of the lower extremities to be beneficial to determine exact degree of stenosis. Electronically Signed   By: Acquanetta Belling M.D.   On: 11/03/2021 07:39   DG Tibia/Fibula Left  Result Date: 11/02/2021 CLINICAL DATA:  Rule out infection. EXAM: LEFT TIBIA AND FIBULA - 2 VIEW COMPARISON:  Left tib fib 11/07/2018 FINDINGS: Negative for fracture.  No evidence of osteomyelitis. Advanced degenerative change in the left knee with marked joint space narrowing and spurring medially. Mild widening of the joint space laterally. Mild arterial calcification. IMPRESSION: Advanced degenerative change in the left knee. No acute abnormality. Electronically Signed   By: Marlan Palau M.D.   On: 11/02/2021 14:26    Labs: Basic Metabolic Panel: Recent Labs  Lab 11/02/21 1409 11/03/21 0425  NA 140 139  K 4.8 4.9  CL 108 109  CO2 25 26  GLUCOSE 105* 81  BUN 27* 26*  CREATININE 1.05* 0.98  CALCIUM 9.3 8.4*   CBC: Recent Labs  Lab 11/02/21 1409 11/03/21 0425  WBC 3.8* 4.5  NEUTROABS 2.0 2.5  HGB 9.8* 8.1*  HCT 33.1* 26.3*  MCV 97.9 94.9  PLT 289 249   Microbiology: Results for orders placed or performed during the hospital encounter of 12/12/20  Resp Panel by RT-PCR (Flu A&B, Covid) Nasopharyngeal Swab     Status: None   Collection Time: 12/12/20  2:52 PM   Specimen: Nasopharyngeal Swab; Nasopharyngeal(NP) swabs in vial transport medium  Result Value Ref Range Status   SARS  Coronavirus 2 by RT PCR NEGATIVE NEGATIVE Final    Comment: (NOTE) SARS-CoV-2 target nucleic acids are NOT DETECTED.  The SARS-CoV-2 RNA is generally detectable in upper respiratory specimens during the acute phase of infection. The lowest concentration of SARS-CoV-2 viral copies this assay can detect is 138 copies/mL. A negative result does not preclude SARS-Cov-2 infection and should not be used  as the sole basis for treatment or other patient management decisions. A negative result may occur with  improper specimen collection/handling, submission of specimen other than nasopharyngeal swab, presence of viral mutation(s) within the areas targeted by this assay, and inadequate number of viral copies(<138 copies/mL). A negative result must be combined with clinical observations, patient history, and epidemiological information. The expected result is Negative.  Fact Sheet for Patients:  EntrepreneurPulse.com.au  Fact Sheet for Healthcare Providers:  IncredibleEmployment.be  This test is no t yet approved or cleared by the [place] FDA and  has been authorized for detection and/or diagnosis of SARS-CoV-2 by FDA under an Emergency Use Authorization (EUA). This EUA will remain  in effect (meaning this test can be used) for the duration of the COVID-19 declaration under Section 564(b)(1) of the Act, 21 U.S.C.section 360bbb-3(b)(1), unless the authorization is terminated  or revoked sooner.       Influenza A by PCR NEGATIVE NEGATIVE Final   Influenza B by PCR NEGATIVE NEGATIVE Final    Comment: (NOTE) The Xpert Xpress SARS-CoV-2/FLU/RSV plus assay is intended as an aid in the diagnosis of influenza from Nasopharyngeal swab specimens and should not be used as a sole basis for treatment. Nasal washings and aspirates are unacceptable for Xpert Xpress SARS-CoV-2/FLU/RSV testing.  Fact Sheet for Patients: EntrepreneurPulse.com.au  Fact Sheet for Healthcare Providers: IncredibleEmployment.be  This test is not yet approved or cleared by the [place] FDA and has been authorized for detection and/or diagnosis of SARS-CoV-2 by FDA under an Emergency Use Authorization (EUA). This EUA will remain in effect (meaning this test can be used) for the duration of the COVID-19 declaration under Section 564(b)(1)  of the Act, 21 U.S.C. section 360bbb-3(b)(1), unless the authorization is terminated or revoked.  Performed at [facility], [address], [place], [place] [address]    Time coordinating discharge: Over 30 minutes  Richarda Osmond, MD  [facility] 11/04/2021, 10:13 AM

## 2021-11-13 ENCOUNTER — Other Ambulatory Visit: Payer: Self-pay | Admitting: Student in an Organized Health Care Education/Training Program

## 2021-12-08 DIAGNOSIS — S81802A Unspecified open wound, left lower leg, initial encounter: Secondary | ICD-10-CM | POA: Diagnosis not present

## 2021-12-08 DIAGNOSIS — N179 Acute kidney failure, unspecified: Secondary | ICD-10-CM | POA: Diagnosis not present

## 2021-12-08 DIAGNOSIS — L089 Local infection of the skin and subcutaneous tissue, unspecified: Secondary | ICD-10-CM | POA: Diagnosis not present

## 2021-12-08 DIAGNOSIS — T148XXA Other injury of unspecified body region, initial encounter: Secondary | ICD-10-CM | POA: Diagnosis not present

## 2021-12-08 DIAGNOSIS — I890 Lymphedema, not elsewhere classified: Secondary | ICD-10-CM | POA: Diagnosis not present

## 2021-12-08 DIAGNOSIS — L97929 Non-pressure chronic ulcer of unspecified part of left lower leg with unspecified severity: Secondary | ICD-10-CM | POA: Diagnosis not present

## 2021-12-08 DIAGNOSIS — L97909 Non-pressure chronic ulcer of unspecified part of unspecified lower leg with unspecified severity: Secondary | ICD-10-CM | POA: Diagnosis not present

## 2021-12-15 ENCOUNTER — Encounter: Payer: Medicare HMO | Attending: Physician Assistant | Admitting: Internal Medicine

## 2021-12-15 DIAGNOSIS — L03116 Cellulitis of left lower limb: Secondary | ICD-10-CM | POA: Diagnosis not present

## 2021-12-15 DIAGNOSIS — I87312 Chronic venous hypertension (idiopathic) with ulcer of left lower extremity: Secondary | ICD-10-CM | POA: Diagnosis not present

## 2021-12-15 DIAGNOSIS — Z7401 Bed confinement status: Secondary | ICD-10-CM | POA: Diagnosis not present

## 2021-12-15 DIAGNOSIS — Z86718 Personal history of other venous thrombosis and embolism: Secondary | ICD-10-CM | POA: Insufficient documentation

## 2021-12-15 DIAGNOSIS — I5032 Chronic diastolic (congestive) heart failure: Secondary | ICD-10-CM | POA: Diagnosis not present

## 2021-12-15 DIAGNOSIS — W19XXXA Unspecified fall, initial encounter: Secondary | ICD-10-CM | POA: Diagnosis not present

## 2021-12-15 DIAGNOSIS — I739 Peripheral vascular disease, unspecified: Secondary | ICD-10-CM | POA: Diagnosis not present

## 2021-12-15 DIAGNOSIS — L97828 Non-pressure chronic ulcer of other part of left lower leg with other specified severity: Secondary | ICD-10-CM | POA: Diagnosis not present

## 2021-12-15 DIAGNOSIS — I110 Hypertensive heart disease with heart failure: Secondary | ICD-10-CM | POA: Diagnosis not present

## 2021-12-15 DIAGNOSIS — M1990 Unspecified osteoarthritis, unspecified site: Secondary | ICD-10-CM | POA: Diagnosis not present

## 2021-12-15 DIAGNOSIS — I890 Lymphedema, not elsewhere classified: Secondary | ICD-10-CM | POA: Insufficient documentation

## 2021-12-15 DIAGNOSIS — I872 Venous insufficiency (chronic) (peripheral): Secondary | ICD-10-CM | POA: Insufficient documentation

## 2021-12-15 DIAGNOSIS — Z7901 Long term (current) use of anticoagulants: Secondary | ICD-10-CM | POA: Insufficient documentation

## 2021-12-15 DIAGNOSIS — L8995 Pressure ulcer of unspecified site, unstageable: Secondary | ICD-10-CM | POA: Diagnosis not present

## 2021-12-16 NOTE — Progress Notes (Signed)
Sorrento, [name] ([id number]) [id number] Page 1 of 10 Visit Report for 12/15/2021 Allergy List Details Patient Name: Date of Service: [name] [name], [name] [name] [name] 12/15/2021 2:45 PM Medical Record Number: [id number] Patient Account Number: [id number] Date of Birth/Sex: Treating RN: [name] [name] (88 y.o. [name] [name] Primary Care Bianco Cange: [name] [name] Other Clinician: Referring [name] [name]: Treating [name] [name]/Extender: [name] [name] in Treatment: 0 Allergies Active Allergies No Known Allergies Allergy Notes Electronic Signature(s) Signed: 12/15/2021 4:17:47 PM By: [name] Coria RN Entered By: [name] [name] on 12/15/2021 14:43:47 -------------------------------------------------------------------------------- Arrival Information Details Patient Name: Date of Service: [name] [name], [name] [name] [name] [name] [name] 12/15/2021 2:45 PM Medical Record Number: [id number] Patient Account Number: [id number] Date of Birth/Sex: Treating RN: [name] (88 y.o. [name] [name] Primary Care [name] [name]: [name] [name] Other Clinician: Referring [name] [name]: Treating [name] [name]/Extender: [name] [name] in Treatment: 0 Visit Information Patient Arrived: Stretcher Arrival Time: 14:40 Accompanied By: son Transfer Assistance: Manual Patient Identification Verified: Yes Secondary Verification Process Completed: Yes Patient Requires Transmission-Based Precautions: No Patient Has Alerts: Yes Patient Alerts: Patient on Blood Thinner ABI L .90 10/24/203 ABI R .97 11/02/2021 Forgy, Earnestine ([id number]) History Since Last Visit All ordered tests and consults were completed: No Added or deleted any medications: No Any new allergies or adverse reactions: No Had a fall or experienced change in activities of daily living that may affect risk of falls: No Signs or symptoms of abuse/neglect since last visito No Hospitalized since last visit: No Implantable device  outside of the clinic excluding cellular tissue based products placed in the [name] since last visit: No Has Dressing in Place as Prescribed: Yes Pain Present Now: No [id number] Page 2 of 10 Electronic Signature(s) Signed: 12/15/2021 4:17:47 PM By: [name] Coria RN Entered By: [name] [name] on 12/15/2021 14:41:51 -------------------------------------------------------------------------------- Clinic Level of Care Assessment Details Patient Name: Date of Service: [name] [name] [name] 12/15/2021 2:45 PM Medical Record Number: [id number] Patient Account Number: [id number] Date of Birth/Sex: Treating RN: [name] (88 y.o. [name] [name] Primary Care [name] [name]: [name] [name] Other Clinician: Referring [name] [name]: Treating [name] [name]: [name] [name] in Treatment: 0 Clinic Level of Care Assessment Items TOOL 4 Quantity Score X- 1 0 Use when only an EandM is performed on FOLLOW-UP visit ASSESSMENTS - Nursing Assessment / Reassessment X- 1 10 Reassessment of Co-morbidities (includes updates in patient status) X- 1 5 Reassessment of Adherence to Treatment Plan ASSESSMENTS - Wound and Skin A ssessment / Reassessment X - Simple Wound Assessment / Reassessment - one wound 1 5 []  - 0 Complex Wound Assessment / Reassessment - multiple wounds []  - 0 Dermatologic / Skin Assessment (not related to wound area) ASSESSMENTS - Focused Assessment []  - 0 Circumferential Edema Measurements - multi extremities []  - 0 Nutritional Assessment / Counseling / Intervention []  - 0 Lower Extremity Assessment (monofilament, tuning fork, pulses) []  - 0 Peripheral Arterial Disease Assessment (using hand held doppler) ASSESSMENTS - Ostomy and/or Continence Assessment and Care []  - 0 Incontinence Assessment and Management []  - 0 Ostomy Care Assessment and Management (repouching, etc.) PROCESS - Coordination of Care X - Simple Patient / Family  Education for ongoing care 1 15 []  - 0 Complex (extensive) Patient / Family Education for ongoing care X- 1 10 Staff obtains Programmer, systems, Records, T Results / Process Orders est []  - 0 Staff telephones HHA, Nursing Homes / Clarify orders / etc []  - 0 Routine Transfer to another Facility (non-emergent condition) []  - 0 Routine Hospital Admission (non-emergent condition) X- 1  [name] [name] / Biomedical engineer / Ordering NPWT Apligraf, etc. , []  - 0 Emergency Hospital Admission (emergent condition) X- 1 10 Simple Discharge Coordination []  - 0 Complex (extensive) Discharge [place] ([id number]) [id number] Page 3 of 10 []  - 0 Pediatric / Minor Patient Management []  - 0 Isolation Patient Management []  - 0 Hearing / Language / Visual special needs []  - 0 Assessment of Community assistance (transportation, D/C planning, etc.) []  - 0 Additional assistance / Altered mentation []  - 0 Support Surface(s) Assessment (bed, cushion, seat, etc.) INTERVENTIONS - Wound Cleansing / Measurement X - Simple Wound Cleansing - one wound 1 5 []  - 0 Complex Wound Cleansing - multiple wounds X- 1 5 Wound Imaging (photographs - any number of wounds) []  - 0 Wound Tracing (instead of photographs) X- 1 5 Simple Wound Measurement - one wound []  - 0 Complex Wound Measurement - multiple wounds INTERVENTIONS - Wound Dressings X - Small Wound Dressing one or multiple wounds 1 10 []  - 0 Medium Wound Dressing one or multiple wounds []  - 0 Large Wound Dressing one or multiple wounds []  - 0 Application of Medications - topical []  - 0 Application of Medications - injection INTERVENTIONS - Miscellaneous []  - 0 External ear exam []  - 0 Specimen Collection (cultures, biopsies, blood, body fluids, etc.) []  - 0 Specimen(s) / Culture(s) sent or taken to Lab for analysis []  - 0 Patient Transfer (multiple staff / Civil Service fast streamer  / Similar devices) []  - 0 Simple Staple / Suture removal (25 or less) []  - 0 Complex Staple / Suture removal (26 or more) []  - 0 Hypo / Hyperglycemic Management (close monitor of Blood Glucose) []  - 0 Ankle / Brachial Index (ABI) - [name] [name] not check if billed separately X- 1 5 Vital Signs Has the patient been seen at the hospital within the last three years: Yes Total Score: 100 Level Of Care: New/Established - Level 3 Electronic Signature(s) Signed: 12/15/2021 4:17:47 PM By: [name] Coria RN Entered By: [name] [name] on 12/15/2021 15:32:18 -------------------------------------------------------------------------------- Encounter Discharge Information Details Patient Name: Date of Service: [name] [name], [name] [name] [name] 12/15/2021 2:45 PM Medical Record Number: [id number] Patient Account Number: [id number] [name] [name], [name] [name] ([id number]) [id number] Page 4 of 10 Date of Birth/Sex: Treating RN: [birth year] (88 y.o. [name] [name] Primary Care Adreanna Fickel: [name] [name] Other Clinician: Referring Alieyah Spader: Treating Corion Sherrod/Extender: [name] [name] in Treatment: 0 Encounter Discharge Information Items Discharge Condition: Stable Ambulatory Status: Stretcher Discharge Destination: Home Transportation: Ambulance Accompanied By: self Schedule Follow-up Appointment: Yes Clinical Summary of Care: Electronic Signature(s) Signed: 12/15/2021 3:39:08 PM By: [name] Coria RN Entered By: [name] [name] on 12/15/2021 15:39:08 -------------------------------------------------------------------------------- Lower Extremity Assessment Details Patient Name: Date of Service: [name] [name], [name] [name] [name] [name] [facility] 12/15/2021 2:45 PM Medical Record Number: [id number] Patient Account Number: [id number] Date of Birth/Sex: Treating RN: [birth year] [name] (88 y.o. [name] [name] Primary Care Lenita Peregrina: [name] [name] Other Clinician: Referring Donesha Wallander: Treating Emira Eubanks/Extender: [name] [name] in Treatment: 0 Edema Assessment Assessed: Shirlyn Goltz: No] [Right: No] Edema: [Left: Ye] [Right: s] Calf Left: Right: Point of Measurement: 35 cm From Medial Instep 55 cm Ankle Left: Right: Point of Measurement: 11 cm From Medial Instep 30 cm Knee To Floor Left: Right: From Medial Instep 48 cm Vascular Assessment Pulses: Dorsalis Pedis Palpable: [Left:Yes] Electronic Signature(s) Signed: 12/15/2021 4:17:47 PM By: [name] Coria RN Entered By: [name] [name] on 12/15/2021 14:56:20 Menger, Jesusa ([id number]) [id number] Page 5 of 10 -------------------------------------------------------------------------------- Multi Wound  Chart Details Patient Name: Date of Service: [name] [name] 12/15/2021 2:45 PM Medical Record Number: [id number] Patient Account Number: [id number] Date of Birth/Sex: Treating RN: [name] (88 y.o. [name] [name] Primary Care Sanai Frick: [name] [name] Other Clinician: Referring Gage Weant: Treating Naw Lasala/Extender: [name] [name] in Treatment: 0 Vital Signs Height(in): 70 Pulse(bpm): 40 Weight(lbs): 175 Blood Pressure(mmHg): 186/85 Body Mass Index(BMI): 25.1 Temperature(F): 98.5 Respiratory Rate(breaths/min): 18 [3:Photos:] [N/A:N/A] Left, Lateral Lower Leg N/A N/A Wound Location: Gradually Appeared N/A N/A Wounding Event: Venous Leg Ulcer N/A N/A Primary Etiology: Cataracts, Lymphedema, N/A N/A Comorbid History: Hypertension, Peripheral Venous Disease, Osteoarthritis 10/27/2021 N/A N/A Date Acquired: 0 N/A N/A Weeks of Treatment: Open N/A N/A Wound Status: No N/A N/A Wound Recurrence: 7.3x5.2x0.8 N/A N/A Measurements L x W x D (cm) 29.814 N/A N/A A (cm) : rea 23.851 N/A N/A Volume (cm) : Full Thickness Without Exposed N/A N/A Classification: Support Structures Medium N/A N/A Exudate A mount: Serosanguineous N/A N/A Exudate Type: red,  brown N/A N/A Exudate Color: Small (1-33%) N/A N/A Granulation A mount: Pink N/A N/A Granulation Quality: Large (67-100%) N/A N/A Necrotic A mount: Eschar, Adherent Slough N/A N/A Necrotic Tissue: Fat Layer (Subcutaneous Tissue): Yes N/A N/A Exposed Structures: None N/A N/A Epithelialization: Treatment Notes Electronic Signature(s) Signed: 12/15/2021 3:52:35 PM By: Kalman Shan [name] [name] Entered By: Kalman Shan on 12/15/2021 15:30:45 [name] [name], [name] [name] ([id number]) [id number] Page 6 of 10 -------------------------------------------------------------------------------- Multi-Disciplinary Care Plan Details Patient Name: Date of Service: [name] [name] [facility] 12/15/2021 2:45 PM Medical Record Number: [id number] Patient Account Number: [id number] Date of Birth/Sex: Treating RN: [birth year][name] (88 y.o. [name] [name] Primary Care Ulyana Pitones: [name] [name] Other Clinician: Referring Chynah Orihuela: Treating Tatijana Bierly/Extender: [name] [name] in Treatment: 0 Active Inactive Necrotic Tissue Nursing Diagnoses: Knowledge deficit related to management of necrotic/devitalized tissue Goals: Patient/caregiver will verbalize understanding of reason and process for debridement of necrotic tissue Date Initiated: 12/15/2021 Target Resolution Date: 01/15/2022 Goal Status: Active Interventions: Assess patient pain level pre-, during and post procedure and prior to discharge Provide education on necrotic tissue and debridement process Treatment Activities: Apply topical anesthetic as ordered : 12/15/2021 Biologic debridement : 12/15/2021 Enzymatic debridement : 12/15/2021 Excisional debridement : 12/15/2021 Notes: Wound/Skin Impairment Nursing Diagnoses: Knowledge deficit related to ulceration/compromised skin integrity Goals: Patient/caregiver will verbalize understanding of skin care regimen Date Initiated: 12/15/2021 Target Resolution Date:  01/15/2022 Goal Status: Active Ulcer/skin breakdown will have a volume reduction of 30% by week 4 Date Initiated: 12/15/2021 Target Resolution Date: 01/15/2022 Goal Status: Active Ulcer/skin breakdown will have a volume reduction of 50% by week 8 Date Initiated: 12/15/2021 Target Resolution Date: 02/15/2022 Goal Status: Active Ulcer/skin breakdown will have a volume reduction of 80% by week 12 Date Initiated: 12/15/2021 Target Resolution Date: 03/16/2022 Goal Status: Active Ulcer/skin breakdown will heal within 14 weeks Date Initiated: 12/15/2021 Target Resolution Date: 04/16/2022 Goal Status: Active Interventions: Assess patient/caregiver ability to obtain necessary supplies Assess patient/caregiver ability to perform ulcer/skin care regimen upon admission and as needed Assess ulceration(s) every visit Notes: [name] [name], [name] [name] ([id number]) [id number] Page 7 of 10 Electronic Signature(s) Signed: 12/15/2021 3:35:11 PM By: [name] Coria RN Entered By: [name] [name] on 12/15/2021 15:35:11 -------------------------------------------------------------------------------- Pain Assessment Details Patient Name: Date of Service: [name] [name] [facility] 12/15/2021 2:45 PM Medical Record Number: [id number] Patient Account Number: [id number] Date of Birth/Sex: Treating RN: January 10, 1927 (88 y.o. [name] [name] Primary Care Armanii Urbanik: [name] [name] Other Clinician: Referring Artemus Romanoff: Treating Arnesha Schiraldi/Extender: [name] [name] in  Treatment: 0 Active Problems Location of Pain Severity and Description of Pain Patient Has Paino No Site Locations Pain Management and Medication Current Pain Management: Electronic Signature(s) Signed: 12/15/2021 4:17:47 PM By: Yevonne Pax RN Entered By: Yevonne Pax on 12/15/2021 14:42:04 -------------------------------------------------------------------------------- Patient/Caregiver Education Details Patient Name: Date of  Service: [name] [name], [name] [name] 12/6/2023andnbsp2:45 PM Roher, Daishia ([id number]) [id number] Page 8 of 10 Medical Record Number: [id number] Patient Account Number: [id number] Date of Birth/Gender: Treating RN: [birth year] (88 y.o. Freddy Finner Primary Care Physician: Clydie Braun Other Clinician: Referring Physician: Treating Physician/Extender: Kayren Eaves in Treatment: 0 Education Assessment Education Provided To: Patient Education Topics Provided Wound/Skin Impairment: Methods: Explain/Verbal Responses: State content correctly Electronic Signature(s) Signed: 12/15/2021 4:17:47 PM By: Yevonne Pax RN Entered By: Yevonne Pax on 12/15/2021 15:33:15 -------------------------------------------------------------------------------- Wound Assessment Details Patient Name: Date of Service: [name] [place], [name] [name] [name] [name] [facility] 12/15/2021 2:45 PM Medical Record Number: [id number] Patient Account Number: [id number] Date of Birth/Sex: Treating RN: [birth year][name] (88 y.o. Freddy Finner Primary Care Tyaisha Cullom: Clydie Braun Other Clinician: Referring Rielynn Trulson: Treating Quaniya Damas/Extender: Kayren Eaves in Treatment: 0 Wound Status Wound Number: 3 Primary Venous Leg Ulcer Etiology: Wound Location: Left, Lateral Lower Leg Wound Open Wounding Event: Gradually Appeared Status: Date Acquired: 10/27/2021 Comorbid Cataracts, Lymphedema, Hypertension, Peripheral Venous Weeks Of Treatment: 0 History: Disease, Osteoarthritis Clustered Wound: No Photos Wound Measurements Length: (cm) 7.3 Width: (cm) 5.2 Depth: (cm) 0.8 Area: (cm) 29.8 Volume: (cm) 23.8 [name] [name], [name] [name] ([id number]) Wound Description Classification: Full Thickness Without Exposed Supp Exudate Amount: Medium Exudate Type: Serosanguineous Exudate Color: red, brown ort Structures Foul Odor After Cleansing: Slough/Fibrino % Reduction in Area: %  Reduction in Volume: Epithelialization: None 14 Tunneling: No 51 Undermining: No [id number] Page 9 of 10 No Yes Wound Bed Granulation Amount: Small (1-33%) Exposed Structure Granulation Quality: Pink Fat Layer (Subcutaneous Tissue) Exposed: Yes Necrotic Amount: Large (67-100%) Necrotic Quality: Eschar, Adherent Slough Treatment Notes Wound #3 (Lower Leg) Wound Laterality: Left, Lateral Cleanser Dakin 16 (oz) 0.25 Discharge Instruction: Use as directed. Peri-Wound Care Topical Primary Dressing Gauze Discharge Instruction: moistened with 1/4 Dakins Solution Secondary Dressing ABD Pad 5x9 (in/in) Discharge Instruction: Cover with ABD pad Kerlix 4.5 x 4.1 (in/yd) Discharge Instruction: Apply Kerlix 4.5 x 4.1 (in/yd) as instructed Secured With Medipore T - 26M Medipore H Soft Cloth Surgical T ape ape, 2x2 (in/yd) Compression Wrap Compression Stockings Add-Ons Electronic Signature(s) Signed: 12/15/2021 3:24:34 PM By: Yevonne Pax RN Entered By: Yevonne Pax on 12/15/2021 15:24:34 -------------------------------------------------------------------------------- Vitals Details Patient Name: Date of Service: [name] [place], [name] [name] [name] [name] [facility] [name] 12/15/2021 2:45 PM Medical Record Number: [id number] Patient Account Number: [id number] Date of Birth/Sex: Treating RN: [birth year] (88 y.o. Freddy Finner Primary Care Noor Witte: Clydie Braun Other Clinician: Referring Krishav Mamone: Treating Delonda Coley/Extender: Kayren Eaves in Treatment: 0 Vital Signs Time Taken: 14:42 Temperature (F): 98.5 Height (in): 70 Pulse (bpm): 62 Source: Stated Respiratory Rate (breaths/min): 18 Weight (lbs): 175 Blood Pressure (mmHg): 186/85 [name] [name], [name] [name] ([id number]) [id number] Page 10 of 10 Source: Stated Reference Range: 80 - 120 mg / dl Body Mass Index (BMI): 25.1 Electronic Signature(s) Signed: 12/15/2021 4:17:47 PM By: Yevonne Pax RN Entered By: Yevonne Pax on 12/15/2021 14:43:29

## 2021-12-16 NOTE — Progress Notes (Signed)
[name], [name] Cella ([id number]) [id number] Page 1 of 5 Visit Report for 12/15/2021 Abuse Risk Screen Details Patient Name: Date of Service: [name] [name] [facility] 12/15/2021 2:45 PM Medical Record Number: [id number] Patient Account Number: [id number] Date of Birth/Sex: Treating [name]: Mar 11, 1926 (88 y.o. [name] [name] Primary Care [name] [name]: [name] [name] Other Clinician: Referring [name] [name]: Treating [name] [name]/Extender: [name] [name] in Treatment: 0 Abuse Risk Screen Items Answer ABUSE RISK SCREEN: Has anyone close to you tried to hurt or harm you recentlyo No [name] [name] you feel uncomfortable with anyone in your familyo No Has anyone forced you [name] [name] things that you didnt want to doo No Electronic Signature(s) Signed: 12/15/2021 4:17:47 PM By: [name] [name] Entered By: [name] [name] 14:44:07 -------------------------------------------------------------------------------- Activities of Daily Living Details Patient Name: Date of Service: [name] [name] [facility] 12/15/2021 2:45 PM Medical Record Number: [id number] Patient Account Number: [id number] Date of Birth/Sex: Treating [name]: [birth year] (88 y.o. [name] [name] Primary Care Cheyrl Buley: [name] [name] Other Clinician: Referring Tajon Moring: Treating Bintou Lafata/Extender: [name] [name] in Treatment: 0 Activities of Daily Living Items Answer Activities of Daily Living (Please select one for each item) Drive Automobile Not Able T Medications ake Need Assistance Use T elephone Need Assistance Care for Appearance Need Assistance Use T oilet Need Assistance Bath / Shower Need Assistance Dress Self Need Assistance Feed Self Completely Able Walk Not Able Get In / Out Bed Need Assistance Housework Not [place], [place] ([id number]) [id number] Page 2 of 5 Prepare Meals Not Able Handle Money Not Able Shop for Self Not  Able Electronic Signature(s) Signed: 12/15/2021 4:17:47 PM By: [name] [name] Entered By: [name] [name] 14:44:49 -------------------------------------------------------------------------------- Education Screening Details Patient Name: Date of Service: [name] [place], [name] [name] [facility] 12/15/2021 2:45 PM Medical Record Number: [id number] Patient Account Number: [id number] Date of Birth/Sex: Treating [name]: 1926-08-14 (88 y.o. [name] [name] Primary Care Marsela Kuan: [name] [name] Other Clinician: Referring Sharlee Rufino: Treating Marek Nghiem/Extender: [name] [name] in Treatment: 0 Primary Learner Assessed: Patient Learning Preferences/Education Level/Primary Language Learning Preference: Explanation Highest Education Level: High School Preferred Language: English Cognitive Barrier Language Barrier: No Translator Needed: No Memory Deficit: No Emotional Barrier: No Cultural/Religious Beliefs Affecting Medical Care: No Physical Barrier Impaired Vision: Yes Glasses Impaired Hearing: No Decreased Hand dexterity: No Knowledge/Comprehension Knowledge Level: Medium Comprehension Level: Medium Ability to understand written instructions: Medium Ability to understand verbal instructions: Medium Motivation Anxiety Level: Anxious Cooperation: Cooperative Education Importance: Acknowledges Need Interest in Health Problems: Asks Questions Perception: Coherent Willingness to Engage in Self-Management Medium Activities: Readiness to Engage in Self-Management Medium Activities: Electronic Signature(s) Signed: 12/15/2021 4:17:47 PM By: [name] [name] Entered By: [name] [name] 14:45:24 Babin, Dhanya ([id number]) [id number] Page 3 of 5 -------------------------------------------------------------------------------- Fall Risk Assessment Details Patient Name: Date of Service: [name] [name] [facility] 12/15/2021 2:45 PM Medical  Record Number: [id number] Patient Account Number: [id number] Date of Birth/Sex: Treating [name]: August 22, 1926 (88 y.o. [name] [name] Primary Care Candela Krul: [name] [name] Other Clinician: Referring Cort Dragoo: Treating Glendal Cassaday/Extender: [name] [name] in Treatment: 0 Fall Risk Assessment Items Have you had 2 or more falls in the last 12 monthso 0 No Have you had any fall that resulted in injury in the last 12 monthso 0 No FALLS RISK SCREEN History of falling - immediate or within 3 months 0 No Secondary diagnosis ([name] [name] you have 2 or more medical diagnoseso) 0 No Ambulatory aid None/bed rest/wheelchair/nurse 0 No Crutches/cane/walker  0 No Furniture 0 No Intravenous therapy Access/Saline/Heparin Lock 0 No Gait/Transferring Normal/ bed rest/ wheelchair 0 No Weak (short steps with or without shuffle, stooped but able to lift head while walking, may seek 0 No support from furniture) Impaired (short steps with shuffle, may have difficulty arising from chair, head down, impaired 0 No balance) Mental Status Oriented to own ability 0 No Electronic Signature(s) Signed: 12/15/2021 4:17:47 PM By: [name] [name] Entered By: [name] [name] 14:45:49 -------------------------------------------------------------------------------- Foot Assessment Details Patient Name: Date of Service: [name] [place], [name] [name] [facility] 12/15/2021 2:45 PM Medical Record Number: [id number] Patient Account Number: [id number] Date of Birth/Sex: Treating [name]: 1926-08-27 (88 y.o. [name] [name] Primary Care Clarita Mcelvain: [name] [name] Other Clinician: Referring AmeLie Hollars: Treating Kindred Reidinger/Extender: [name] [name] in Treatment: 0 Foot Assessment Items [x]  Unable to perform due to altered mental status Site Locations [place], [place] ([place]) [id number] Page 4 of 5 + = Sensation present, - = Sensation absent, C = Callus, U = Ulcer R =  Redness, W = Warmth, M = Maceration, PU = Pre-ulcerative lesion F = Fissure, S = Swelling, D = Dryness Assessment Right: Left: Other Deformity: No No Prior Foot Ulcer: No No Prior Amputation: No No Charcot Joint: No No Ambulatory Status: Non-ambulatory Assistance Device: 06-20-2003) Signed: 12/15/2021 4:17:47 PM By: 14/06/2021 [name] Entered By: [name] [name] 14:53:13 -------------------------------------------------------------------------------- Nutrition Risk Screening Details Patient Name: Date of Service: 14/06/2021 [facility] 12/15/2021 2:45 PM Medical Record Number: 14/06/2021 Patient Account Number: [id number] Date of Birth/Sex: Treating [name]: [birth year] (88 y.o. 83 Primary Care Gissele Narducci: [name] [name] Other Clinician: Referring Takaya Hyslop: Treating Xoie Kreuser/Extender: [name] [name] in Treatment: 0 Height (in): 70 Weight (lbs): 175 Body Mass Index (BMI): 25.1 Nutrition Risk Screening Items Score Screening NUTRITION RISK SCREEN: I have an illness or condition that made me change the kind and/or amount of food I eat 0 No I eat fewer than two meals per day 0 No I eat few fruits and vegetables, or milk products 0 No I have three or more drinks of beer, liquor or wine almost every day 0 No I have tooth or mouth problems that make it hard for me to eat 0 No Bozzi, Mera ([name] [name]) [id number] Page 5 of 5 I don't always have enough money to buy the food I need 0 No I eat alone most of the time 0 No I take three or more different prescribed or over-the-counter drugs a day 1 Yes Without wanting to, I have lost or gained 10 pounds in the last six months 0 No I am not always physically able to shop, cook and/or feed myself 2 Yes Nutrition Protocols Good Risk Protocol Moderate Risk Protocol 0 Provide education on nutrition High Risk Proctocol Risk Level:  Moderate Risk Score: 3 Electronic Signature(s) Signed: 12/15/2021 4:17:47 PM By: 14/06/2021 [name] Entered By: [name] [name] 14:46:13

## 2021-12-17 NOTE — Progress Notes (Signed)
AERIONNA, MORAVEK ([id number]) [id number] Page 1 of 9 Visit Report for 12/15/2021 Chief Complaint Document Details Patient Name: Date of Service: [name] [name], [place] RO [facility] 12/15/2021 2:45 PM Medical Record Number: [id number] Patient Account Number: [id number] Date of Birth/Sex: Treating RN: Apr 30, 1926 (88 y.o. Freddy Finner Primary Care Provider: Clydie Braun Other Clinician: Referring Provider: Treating Provider/Extender: Kayren Eaves in Treatment: 0 Information Obtained from: Patient Chief Complaint 12/15/2021; left lower extremity wound Electronic Signature(s) Signed: 12/15/2021 3:52:35 PM By: Geralyn Corwin DO Entered By: Geralyn Corwin on 12/15/2021 15:30:55 -------------------------------------------------------------------------------- HPI Details Patient Name: Date of Service: [name] Peru, DO RO [facility] 12/15/2021 2:45 PM Medical Record Number: [id number] Patient Account Number: [id number] Date of Birth/Sex: Treating RN: [birth year] (88 y.o. Freddy Finner Primary Care Provider: Clydie Braun Other Clinician: Referring Provider: Treating Provider/Extender: Kayren Eaves in Treatment: 0 History of Present Illness HPI Description: 06/01/17-She is here for initial evaluation of left lower extremity ulcers. She states these have been present for approximately 6-7 months; she is unclear exactly who she has followed with, states [facility], people in [place]. She reports a history of lymphedema pumps with extremely sporadic use over the last 2 years, none recently. She has been treated with Unna boots in the left lower extremity, no compression to the right lower extremity. She states her lower extremity edema is at/near baseline, I have low suspicion for DVT prior to aggressively compressing and initiating lymphdema pumps twice daily; she denies IVC filter. I see no recent ultrasound in Epic; last  DVT study was to the left in February. She denies pain, low suspicion of cellulitis. 06/15/17-She is here in follow-up evaluation for bilateral lower extremity edema and left lower extremity ulcers. She was a no call/no show at last week's appointment so we have been unable to confirm her home health agency. She did go for the ordered venous ultrasound 2 weeks ago which was negative for DVT bilaterally. Her family member confirms that she was discharged from [facility] and that they ordered home health at discharge; we will reach out to them. Despite this home health has been applying unna boots to the left leg. Readmission: 02/01/2021 upon evaluation today patient appears for reevaluation in the clinic although symptoms have been since 2019 last saw her. Subsequently the issue today is actually a wound on the medial aspect of her left heel. Fortunately I do not see any signs of active infection at this time which is great news. No fevers, chills, nausea, vomiting, or diarrhea. [name], [name] ([id number]) [id number] Page 2 of 9 Of note the patient does have a history of hypertension, congestive heart failure, and peripheral vascular disease. She is on a blood thinner, Eliquis. Although her son and the patient both were stating she was not taking anything this was picked up on the ninth of this month according to the records from the pharmacy. This was along with the Augmentin which apparently she is also taking according to the son. Either way I am concerned about the fact that they do not really know exactly what she is taking and how often and I want them to go home and check on this as soon as they leave here today. 02/15/2021 upon evaluation today patient appears to be doing about the same in regard to her wound. Fortunately I do not see any signs of active infection at this time which is great news. Patient has been placed on palliative care since I last  saw her according to her caregiver. Nonetheless I do believe that the wound is showing signs of no infection which is great news. Nonetheless I still think that this is going to be somewhat difficult to heal with her ABIs the way they are. 12/15/2021 [name] [name] is a 88 year old female with a past medical history of venous insufficiency, dementia, chronic diastolic heart failure and DVT on Eliquis that presents to the clinic for a 1-110-month history of nonhealing ulcer to the left lower extremity. She was admitted to the hospital on 11/02/2021 for a left lower extremity ulcer with cellulitis. She was treated with antibiotics and discharged on Keflex. She completed this course. Currently she has not been using compression therapy. She has been using Silvadene to the wound bed. Son is present during the encounter. He is doing the dressing changes. There is increased erythema and warmth to the Surrounding soft tissue. No purulent drainage. Electronic Signature(s) Signed: 12/15/2021 3:52:35 PM By: Geralyn Corwin DO Entered By: Geralyn Corwin on 12/15/2021 15:31:23 -------------------------------------------------------------------------------- Physical Exam Details Patient Name: Date of Service: [name] Peru, DO RO [facility] 12/15/2021 2:45 PM Medical Record Number: [id number] Patient Account Number: [id number] Date of Birth/Sex: Treating RN: 1926-09-12 (88 y.o. Freddy Finner Primary Care Provider: Clydie Braun Other Clinician: Referring Provider: Treating Provider/Extender: Kayren Eaves in Treatment: 0 Constitutional . Cardiovascular . Psychiatric . Notes Left lower extremity: 2+ pitting edema to the knee, open wound to the lateral aspect with nonviable surface throughout. Increased erythema and warmth to the surrounding soft tissue of the periwound. No purulent drainage. Electronic Signature(s) Signed: 12/15/2021 3:52:35 PM By: Geralyn Corwin  DO Entered By: Geralyn Corwin on 12/15/2021 15:31:41 Physician Orders Details -------------------------------------------------------------------------------- [name] [name] ([id number]) [id number] Page 3 of 9 Patient Name: Date of Service: [name] [name] [facility] 12/15/2021 2:45 PM Medical Record Number: [id number] Patient Account Number: [id number] Date of Birth/Sex: Treating RN: [birth year] (88 y.o. Freddy Finner Primary Care Provider: Clydie Braun Other Clinician: Referring Provider: Treating Provider/Extender: Kayren Eaves in Treatment: 0 Verbal / Phone Orders: No Diagnosis Coding ICD-10 Coding Code Description [id number] Chronic venous hypertension (idiopathic) with ulcer of left lower extremity L97.828 Non-pressure chronic ulcer of other part of left lower leg with other specified severity I89.0 Lymphedema, not elsewhere classified I50.32 Chronic diastolic (congestive) heart failure Z79.01 Long term (current) use of anticoagulants Z86.718 Personal history of other venous thrombosis and embolism Follow-up Appointments Return Appointment in 1 week. Home Health ADMIT to Home Health for wound care. May utilize formulary equivalent dressing for wound treatment orders unless otherwise specified. Home Health Nurse may visit PRN to address patients wound care needs. - [facility] [id number] Anesthetic (Use 'Patient Medications' Section for Anesthetic Order Entry) Lidocaine applied to wound bed Edema Control - Lymphedema / Segmental Compressive Device / Other Tubigrip double layer applied - size F Elevate, Exercise Daily and A void Standing for Long Periods of Time. Elevate legs to the level of the heart and pump ankles as often as possible Elevate leg(s) parallel to the floor when sitting. Wound Treatment Wound #3 - Lower Leg Wound Laterality: Left, Lateral Cleanser: Dakin 16 (oz) 0.25 1 x Per Day/30 Days Discharge  Instructions: Use as directed. Prim Dressing: Gauze 1 x Per Day/30 Days ary Discharge Instructions: moistened with 1/4 Dakins Solution Secondary Dressing: ABD Pad 5x9 (in/in) 1 x Per Day/30 Days Discharge Instructions: Cover with ABD pad Secondary Dressing: Kerlix 4.5 x 4.1 (in/yd) 1 x Per Day/30 Days Discharge Instructions: Apply  Kerlix 4.5 x 4.1 (in/yd) as instructed Secured With: Medipore T - 57M Medipore H Soft Cloth Surgical T ape ape, 2x2 (in/yd) 1 x Per Day/30 Days Patient Medications llergies: No Known Allergies A Notifications Medication Indication Start End 12/15/2021 Dakin's Solution DOSE 1 - miscellaneous 0.125 % solution - moisten gauze for wet to dry dressings daily 12/15/2021 amoxicillin-pot clavulanate DOSE 1 - oral 875 mg-125 mg tablet - 1 tablet oral twice a day x 10 days 12/15/2021 doxycycline hyclate DOSE 1 - oral 100 mg tablet - 1 tablet oral twice a day x 10 days Electronic Signature(s) Signed: 12/15/2021 4:17:47 PM By: Yevonne Pax RN Signed: 12/16/2021 1:05:11 PM By: Geralyn Corwin DO Previous Signature: 12/15/2021 3:52:35 PM Version By: Geralyn Corwin DO Previous Signature: 12/15/2021 3:31:30 PM Version By: Yevonne Pax RN Previous Signature: 12/15/2021 3:19:53 PM Version By: Geralyn Corwin DO Entered By: Yevonne Pax on 12/15/2021 16:13:12 Chacko, Tearia ([id number]) [id number] Page 4 of 9 -------------------------------------------------------------------------------- Problem List Details Patient Name: Date of Service: [name] [name] [facility] 12/15/2021 2:45 PM Medical Record Number: [id number] Patient Account Number: [id number] Date of Birth/Sex: Treating RN: [birth year] (88 y.o. Freddy Finner Primary Care Provider: Clydie Braun Other Clinician: Referring Provider: Treating Provider/Extender: Kayren Eaves in Treatment: 0 Active Problems ICD-10 Encounter Code Description Active Date  MDM Diagnosis I87.312 Chronic venous hypertension (idiopathic) with ulcer of left lower extremity 12/15/2021 No Yes L97.828 Non-pressure chronic ulcer of other part of left lower leg with other specified 12/15/2021 No Yes severity I89.0 Lymphedema, not elsewhere classified 12/15/2021 No Yes I50.32 Chronic diastolic (congestive) heart failure 12/15/2021 No Yes Z79.01 Long term (current) use of anticoagulants 12/15/2021 No Yes Z86.718 Personal history of other venous thrombosis and embolism 12/15/2021 No Yes L03.116 Cellulitis of left lower limb 12/15/2021 No Yes Inactive Problems Resolved Problems Electronic Signature(s) Signed: 12/15/2021 3:52:35 PM By: Geralyn Corwin DO Entered By: Geralyn Corwin on 12/15/2021 15:45:42 Mccomb, Shayanna ([id number]) [id number] Page 5 of 9 -------------------------------------------------------------------------------- Progress Note Details Patient Name: Date of Service: [name] [name] [facility] 12/15/2021 2:45 PM Medical Record Number: [id number] Patient Account Number: [id number] Date of Birth/Sex: Treating RN: November 05, 1926 (88 y.o. Freddy Finner Primary Care Provider: Clydie Braun Other Clinician: Referring Provider: Treating Provider/Extender: Kayren Eaves in Treatment: 0 Subjective Chief Complaint Information obtained from Patient 12/15/2021; left lower extremity wound History of Present Illness (HPI) 06/01/17-She is here for initial evaluation of left lower extremity ulcers. She states these have been present for approximately 6-7 months; she is unclear exactly who she has followed with, states [facility], people in [place]. She reports a history of lymphedema pumps with extremely sporadic use over the last 2 years, none recently. She has been treated with Unna boots in the left lower extremity, no compression to the right lower extremity. She states her lower extremity edema is at/near  baseline, I have low suspicion for DVT prior to aggressively compressing and initiating lymphdema pumps twice daily; she denies IVC filter. I see no recent ultrasound in Epic; last DVT study was to the left in February. She denies pain, low suspicion of cellulitis. 06/15/17-She is here in follow-up evaluation for bilateral lower extremity edema and left lower extremity ulcers. She was a no call/no show at last week's appointment so we have been unable to confirm her home health agency. She did go for the ordered venous ultrasound 2 weeks ago which was negative for DVT bilaterally. Her family member confirms that she was discharged from [facility] and that they ordered home health at discharge; we will reach out to them. Despite this home health has been applying unna boots to the left leg. Readmission: 02/01/2021 upon evaluation today patient appears for reevaluation in the clinic although symptoms have been since 2019 last saw her. Subsequently the issue today is actually a wound on the medial aspect of her left heel. Fortunately I do not see any signs of active infection at this time which is great news. No fevers, chills, nausea, vomiting, or diarrhea. Of note the patient does have a history of hypertension, congestive heart failure, and peripheral vascular disease. She is on a blood thinner, Eliquis. Although her son and the patient both were stating she was not taking anything this was picked up on the ninth of this month according to the records from the pharmacy. This was along with the Augmentin which apparently she is also taking according to the son. Either way I am concerned about the fact that they do not really know exactly what she is taking and how often and I want them to go home and check on this as soon as they leave here today. 02/15/2021 upon evaluation today patient appears to be doing about the same in regard to her wound. Fortunately I do not see any signs of active  infection at this time which is great news. Patient has been placed on palliative care since I last saw her according to her caregiver. Nonetheless I do believe that the wound is showing signs of no infection which is great news. Nonetheless I still think that this is going to be somewhat difficult to heal with her ABIs the way they are. 12/15/2021 Ms. Saraiya Kozma is a 88 year old female with a past medical history of venous insufficiency, dementia, chronic diastolic heart failure and DVT on Eliquis that presents to the clinic for a 1-53-month history of nonhealing ulcer to the left lower extremity. She was admitted to the hospital on 11/02/2021 for a left lower extremity ulcer with cellulitis. She was treated with antibiotics and discharged on Keflex. She completed this course. Currently she has not been using compression therapy. She has been using Silvadene to the wound bed. Son is present during the encounter. He is doing the dressing changes. There is increased erythema and warmth to the Surrounding soft tissue. No purulent drainage. Patient History Information obtained from Patient. Allergies No Known Allergies Family History Diabetes - Father, No family history of Cancer, Heart Disease, Hereditary Spherocytosis, Hypertension, Kidney Disease, Lung Disease, Seizures, Stroke, Thyroid Problems, Tuberculosis. Social History Never smoker, Marital Status - Widowed, Alcohol Use - Never, Drug Use - No History, Caffeine Use - Moderate. Medical History Eyes Patient has history of Cataracts Denies history of Glaucoma, Optic Neuritis Ear/Nose/Mouth/Throat Denies history of Chronic sinus problems/congestion, Middle ear problems Hematologic/Lymphatic Patient has history of Lymphedema Denies history of Anemia, Hemophilia, Human Immunodeficiency Virus, Sickle Cell Disease Garn, Devanie ([id number]) [id number] Page 6 of 9 Respiratory Denies history of Aspiration,  Asthma, Chronic Obstructive Pulmonary Disease (COPD), Pneumothorax, Sleep Apnea, Tuberculosis Cardiovascular Patient has history of Hypertension, Peripheral Venous Disease Denies history of Angina, Arrhythmia, Congestive Heart Failure, Coronary Artery Disease, Deep Vein Thrombosis, Hypotension, Myocardial Infarction, Peripheral Arterial Disease, Phlebitis, Vasculitis Gastrointestinal Denies history of Cirrhosis , Colitis, Crohnoos, Hepatitis A, Hepatitis B, Hepatitis C Endocrine Denies history of Type I Diabetes, Type II Diabetes Genitourinary Denies history of End Stage Renal Disease Immunological Denies history of Lupus Erythematosus, Raynaudoos, Scleroderma Integumentary (Skin) Denies history  of History of Burn, History of pressure wounds Musculoskeletal Patient has history of Osteoarthritis Denies history of Gout, Rheumatoid Arthritis, Osteomyelitis Neurologic Denies history of Dementia, Neuropathy, Quadriplegia, Paraplegia, Seizure Disorder Oncologic Denies history of Received Chemotherapy, Received Radiation Psychiatric Denies history of Anorexia/bulimia, Confinement Anxiety Objective Constitutional Vitals Time Taken: 2:42 PM, Height: 70 in, Source: Stated, Weight: 175 lbs, Source: Stated, BMI: 25.1, Temperature: 98.5 F, Pulse: 62 bpm, Respiratory Rate: 18 breaths/min, Blood Pressure: 186/85 mmHg. General Notes: Left lower extremity: 2+ pitting edema to the knee, open wound to the lateral aspect with nonviable surface throughout. Increased erythema and warmth to the surrounding soft tissue of the periwound. No purulent drainage. Integumentary (Hair, Skin) Wound #3 status is Open. Original cause of wound was Gradually Appeared. The date acquired was: 10/27/2021. The wound is located on the Left,Lateral Lower Leg. The wound measures 7.3cm length x 5.2cm width x 0.8cm depth; 29.814cm^2 area and 23.851cm^3 volume. There is Fat Layer (Subcutaneous Tissue) exposed. There is no  tunneling or undermining noted. There is a medium amount of serosanguineous drainage noted. There is small (1-33%) pink granulation within the wound bed. There is a large (67-100%) amount of necrotic tissue within the wound bed including Eschar and Adherent Slough. Assessment Active Problems ICD-10 Chronic venous hypertension (idiopathic) with ulcer of left lower extremity Non-pressure chronic ulcer of other part of left lower leg with other specified severity Lymphedema, not elsewhere classified Chronic diastolic (congestive) heart failure Long term (current) use of anticoagulants Personal history of other venous thrombosis and embolism Patient presents with a 1 to 9568-month history of nonhealing ulcer to the left lower extremity complicated by cellulitis and delayed in healing due to venous insufficiency/lymphedema. The area still appears infected and I recommended oral antibiotics. Also recommended Dakin's wet-to-dry dressings to help address bioburden. She has significant swelling on exam and desperately needs better edema control. For now I recommended Tubigrip. Eventually she will need in office compression wraps. ABIs are normal. I will see her back in 1 week. Plan Follow-up Appointments: Return Appointment in 1 week. Home Health: ADMIT to Home Health for wound care. May utilize formulary equivalent dressing for wound treatment orders unless otherwise specified. Home Health Nurse may visit PRN to address patientoos wound care needs. - [facility] [phone] Anesthetic (Use 'Patient Medications' Section for Anesthetic Order Entry): Lidocaine applied to wound bed Edema Control - Lymphedema / Segmental Compressive Device / OtherCathlean Cower: Ontko, Nicole CellaROTHY ([id number]) [id number] Page 7 of 9 Tubigrip double layer applied - size F Elevate, Exercise Daily and Avoid Standing for Long Periods of Time. Elevate legs to the level of the heart and pump ankles as often as  possible Elevate leg(s) parallel to the floor when sitting. The following medication(s) was prescribed: Dakin's Solution miscellaneous 0.125 % solution 1 moisten gauze for wet to dry dressings daily starting 12/15/2021 amoxicillin-pot clavulanate oral 875 mg-125 mg tablet 1 1 tablet oral twice a day x 10 days starting 12/15/2021 doxycycline hyclate oral 100 mg tablet 1 1 tablet oral twice a day x 10 days starting 12/15/2021 WOUND #3: - Lower Leg Wound Laterality: Left, Lateral Cleanser: Dakin 16 (oz) 0.25 1 x Per Day/30 Days Discharge Instructions: Use as directed. Prim Dressing: Gauze 1 x Per Day/30 Days ary Discharge Instructions: moistened with 1/4 Dakins Solution Secondary Dressing: ABD Pad 5x9 (in/in) 1 x Per Day/30 Days Discharge Instructions: Cover with ABD pad Secondary Dressing: Kerlix 4.5 x 4.1 (in/yd) 1 x Per Day/30 Days Discharge Instructions: Apply Kerlix 4.5 x 4.1 (in/yd) as  instructed Secured With: Medipore T - 36M Medipore H Soft Cloth Surgical T ape ape, 2x2 (in/yd) 1 x Per Day/30 Days 1. Augmentin and doxycycline 2. Dakin's wet-to-dry dressings 3. Tubigrip daily 4. Follow-up in 1 week Electronic Signature(s) Signed: 12/15/2021 3:52:35 PM By: Geralyn Corwin DO Entered By: Geralyn Corwin on 12/15/2021 15:44:57 -------------------------------------------------------------------------------- ROS/PFSH Details Patient Name: Date of Service: [name] Peru, DO RO [facility] 12/15/2021 2:45 PM Medical Record Number: [id number] Patient Account Number: [id number] Date of Birth/Sex: Treating RN: Jun 08, 1926 (88 y.o. Freddy Finner Primary Care Provider: Clydie Braun Other Clinician: Referring Provider: Treating Provider/Extender: Kayren Eaves in Treatment: 0 Information Obtained From Patient Eyes Medical History: Positive for: Cataracts Negative for: Glaucoma; Optic Neuritis Ear/Nose/Mouth/Throat Medical History: Negative for: Chronic sinus  problems/congestion; Middle ear problems Hematologic/Lymphatic Medical History: Positive for: Lymphedema Negative for: Anemia; Hemophilia; Human Immunodeficiency Virus; Sickle Cell Disease Respiratory Medical History: Negative for: Aspiration; Asthma; Chronic Obstructive Pulmonary Disease (COPD); Pneumothorax; Sleep Apnea; Tuberculosis [place], Zanai ([id number]) [id number] Page 8 of 9 Cardiovascular Medical History: Positive for: Hypertension; Peripheral Venous Disease Negative for: Angina; Arrhythmia; Congestive Heart Failure; Coronary Artery Disease; Deep Vein Thrombosis; Hypotension; Myocardial Infarction; Peripheral Arterial Disease; Phlebitis; Vasculitis Gastrointestinal Medical History: Negative for: Cirrhosis ; Colitis; Crohns; Hepatitis A; Hepatitis B; Hepatitis C Endocrine Medical History: Negative for: Type I Diabetes; Type II Diabetes Genitourinary Medical History: Negative for: End Stage Renal Disease Immunological Medical History: Negative for: Lupus Erythematosus; Raynauds; Scleroderma Integumentary (Skin) Medical History: Negative for: History of Burn; History of pressure wounds Musculoskeletal Medical History: Positive for: Osteoarthritis Negative for: Gout; Rheumatoid Arthritis; Osteomyelitis Neurologic Medical History: Negative for: Dementia; Neuropathy; Quadriplegia; Paraplegia; Seizure Disorder Oncologic Medical History: Negative for: Received Chemotherapy; Received Radiation Psychiatric Medical History: Negative for: Anorexia/bulimia; Confinement Anxiety HBO Extended History Items Eyes: Cataracts Immunizations Pneumococcal Vaccine: Received Pneumococcal Vaccination: Yes Received Pneumococcal Vaccination On or After 60th Birthday: Yes Implantable Devices No devices added Family and Social History Cancer: No; Diabetes: Yes - Father; Heart Disease: No; Hereditary Spherocytosis: No; Hypertension: No; Kidney Disease:  No; Lung Disease: No; Seizures: No; Stroke: No; Thyroid Problems: No; Tuberculosis: No; Never smoker; Marital Status - Widowed; Alcohol Use: Never; Drug Use: No History; Caffeine Use: Moderate; Financial Concerns: No; Food, Clothing or Shelter Needs: No; Support System Lacking: No; Transportation Concerns: No Electronic Signature(s) Signed: 12/15/2021 3:52:35 PM By: Geralyn Corwin DO Signed: 12/15/2021 4:17:47 PM By: Yevonne Pax RN Entered By: Yevonne Pax on 12/15/2021 14:44:00 Rhew, Burlene ([id number]) [id number] Page 9 of 9 -------------------------------------------------------------------------------- SuperBill Details Patient Name: Date of Service: Celesta Aver RO [facility] 12/15/2021 Medical Record Number: [id number] Patient Account Number: [id number] Date of Birth/Sex: Treating RN: [birth year] (88 y.o. Freddy Finner Primary Care Provider: Clydie Braun Other Clinician: Referring Provider: Treating Provider/Extender: Kayren Eaves in Treatment: 0 Diagnosis Coding ICD-10 Codes Code Description [phone] Chronic venous hypertension (idiopathic) with ulcer of left lower extremity L97.828 Non-pressure chronic ulcer of other part of left lower leg with other specified severity I89.0 Lymphedema, not elsewhere classified I50.32 Chronic diastolic (congestive) heart failure Z79.01 Long term (current) use of anticoagulants Z86.718 Personal history of other venous thrombosis and embolism L03.116 Cellulitis of left lower limb Facility Procedures : CPT4 Code: 60109323 Description: 99213 - WOUND CARE VISIT-LEV 3 EST PT Modifier: Quantity: 1 Physician Procedures : CPT4 Code Description Modifier [id number] 99214 - WC PHYS LEVEL 4 - EST PT ICD-10 Diagnosis Description I87.312 Chronic venous hypertension (idiopathic) with ulcer of left lower extremity L97.828 Non-pressure chronic ulcer of  other part of left lower leg  with other specified  severity L03.116 Cellulitis of left lower limb I89.0 Lymphedema, not elsewhere classified Quantity: 1 Electronic Signature(s) Signed: 12/15/2021 3:52:35 PM By: Geralyn Corwin DO Previous Signature: 12/15/2021 3:33:03 PM Version By: Yevonne Pax RN Entered By: Geralyn Corwin on 12/15/2021 15:46:13

## 2021-12-22 ENCOUNTER — Encounter (HOSPITAL_BASED_OUTPATIENT_CLINIC_OR_DEPARTMENT_OTHER): Payer: Medicare HMO | Admitting: Internal Medicine

## 2021-12-22 DIAGNOSIS — L97828 Non-pressure chronic ulcer of other part of left lower leg with other specified severity: Secondary | ICD-10-CM | POA: Diagnosis not present

## 2021-12-22 DIAGNOSIS — M1990 Unspecified osteoarthritis, unspecified site: Secondary | ICD-10-CM | POA: Diagnosis not present

## 2021-12-22 DIAGNOSIS — I872 Venous insufficiency (chronic) (peripheral): Secondary | ICD-10-CM | POA: Diagnosis not present

## 2021-12-22 DIAGNOSIS — L03116 Cellulitis of left lower limb: Secondary | ICD-10-CM

## 2021-12-22 DIAGNOSIS — I110 Hypertensive heart disease with heart failure: Secondary | ICD-10-CM | POA: Diagnosis not present

## 2021-12-22 DIAGNOSIS — I5032 Chronic diastolic (congestive) heart failure: Secondary | ICD-10-CM | POA: Diagnosis not present

## 2021-12-22 DIAGNOSIS — I890 Lymphedema, not elsewhere classified: Secondary | ICD-10-CM | POA: Diagnosis not present

## 2021-12-22 DIAGNOSIS — I739 Peripheral vascular disease, unspecified: Secondary | ICD-10-CM | POA: Diagnosis not present

## 2021-12-22 DIAGNOSIS — I87312 Chronic venous hypertension (idiopathic) with ulcer of left lower extremity: Secondary | ICD-10-CM

## 2021-12-22 DIAGNOSIS — Z7901 Long term (current) use of anticoagulants: Secondary | ICD-10-CM | POA: Diagnosis not present

## 2021-12-22 DIAGNOSIS — Z86718 Personal history of other venous thrombosis and embolism: Secondary | ICD-10-CM | POA: Diagnosis not present

## 2021-12-22 NOTE — Progress Notes (Signed)
[name], [name] ([id number]) [id number] Page 1 of 7 Visit Report for 12/22/2021 Chief Complaint Document Details Patient Name: Date of Service: [name] [name], [place] RO [facility] 12/22/2021 2:00 PM Medical Record Number: [id number] Patient Account Number: [id number] Date of Birth/Sex: Treating RN: 1926-07-08 (88 y.o. Freddy Finner Primary Care Provider: Clydie Braun Other Clinician: Referring Provider: Treating Provider/Extender: Kayren Eaves in Treatment: 1 Information Obtained from: Patient Chief Complaint 12/15/2021; left lower extremity wound Electronic Signature(s) Signed: 12/22/2021 3:29:26 PM By: Geralyn Corwin [name] [name] Entered By: Geralyn Corwin on 12/22/2021 15:04:36 -------------------------------------------------------------------------------- HPI Details Patient Name: Date of Service: [name] [place], [name] [name] RO [facility] 12/22/2021 2:00 PM Medical Record Number: [id number] Patient Account Number: [id number] Date of Birth/Sex: Treating RN: 1926-06-24 (88 y.o. Freddy Finner Primary Care Provider: Clydie Braun Other Clinician: Referring Provider: Treating Provider/Extender: Kayren Eaves in Treatment: 1 History of Present Illness HPI Description: 06/01/17-She is here for initial evaluation of left lower extremity ulcers. She states these have been present for approximately 6-7 months; she is unclear exactly who she has followed with, states [facility], people in [place]. She reports a history of lymphedema pumps with extremely sporadic use over the last 2 years, none recently. She has been treated with Unna boots in the left lower extremity, no compression to the right lower extremity. She states her lower extremity edema is at/near baseline, I have low suspicion for DVT prior to aggressively compressing and initiating lymphdema pumps twice daily; she denies IVC filter. I see no recent ultrasound in Epic;  last DVT study was to the left in February. She denies pain, low suspicion of cellulitis. 06/15/17-She is here in follow-up evaluation for bilateral lower extremity edema and left lower extremity ulcers. She was a no call/no show at last week's appointment so we have been unable to confirm her home health agency. She did go for the ordered venous ultrasound 2 weeks ago which was negative for DVT bilaterally. Her family member confirms that she was discharged from [facility] and that they ordered home health at discharge; we will reach out to them. Despite this home health has been applying unna boots to the left leg. Readmission: 02/01/2021 upon evaluation today patient appears for reevaluation in the clinic although symptoms have been since 2019 last saw her. Subsequently the issue today is actually a wound on the medial aspect of her left heel. Fortunately I [name] [name] not see any signs of active infection at this time which is great news. No fevers, chills, nausea, vomiting, or diarrhea. [name] [name], [name] [name] ([id number]) [id number] Page 2 of 7 Of note the patient does have a history of hypertension, congestive heart failure, and peripheral vascular disease. She is on a blood thinner, Eliquis. Although her son and the patient both were stating she was not taking anything this was picked up on the ninth of this month according to the records from the pharmacy. This was along with the Augmentin which apparently she is also taking according to the son. Either way I am concerned about the fact that they [name] [name] not really know exactly what she is taking and how often and I want them to go home and check on this as soon as they leave here today. 02/15/2021 upon evaluation today patient appears to be doing about the same in regard to her wound. Fortunately I [name] [name] not see any signs of active infection at this time which is great news. Patient has been placed on palliative care since I  last  saw her according to her caregiver. Nonetheless I [name] [name] believe that the wound is showing signs of no infection which is great news. Nonetheless I still think that this is going to be somewhat difficult to heal with her ABIs the way they are. 12/15/2021 [name] is a 88 year old female with a past medical history of venous insufficiency, dementia, chronic diastolic heart failure and DVT on Eliquis that presents to the clinic for a 1-85-month history of nonhealing ulcer to the left lower extremity. She was admitted to the hospital on 11/02/2021 for a left lower extremity ulcer with cellulitis. She was treated with antibiotics and discharged on Keflex. She completed this course. Currently she has not been using compression therapy. She has been using Silvadene to the wound bed. Son is present during the encounter. He is doing the dressing changes. There is increased erythema and warmth to the Surrounding soft tissue. No purulent drainage. 12/13; patient presents for follow-up. She states she is taking her antibiotics. She has been doing Dakin's wet-to-dry dressings. She has no issues or complaints today. She reports improvement in wound healing. She is not on a diuretic. Electronic Signature(s) Signed: 12/22/2021 3:29:26 PM By: Geralyn Corwin [name] [name] Entered By: Geralyn Corwin on 12/22/2021 15:06:20 -------------------------------------------------------------------------------- Physical Exam Details Patient Name: Date of Service: [name] Peru, [name] [name] RO [facility] 12/22/2021 2:00 PM Medical Record Number: [id number] Patient Account Number: [id number] Date of Birth/Sex: Treating RN: 1926-08-14 (88 y.o. Freddy Finner Primary Care Provider: Clydie Braun Other Clinician: Referring Provider: Treating Provider/Extender: Kayren Eaves in Treatment: 1 Constitutional . Cardiovascular . Psychiatric . Notes Left lower extremity: 2+ pitting edema to the knee, open wound  to the lateral aspect with nonviable tissue and granulation tissue. No increased erythema and warmth to the surrounding soft tissue. No purulent drainage. Electronic Signature(s) Signed: 12/22/2021 3:29:26 PM By: Geralyn Corwin [name] [name] Entered By: Geralyn Corwin on 12/22/2021 15:07:29 Bottoms, Jenille ([id number]) [id number] Page 3 of 7 -------------------------------------------------------------------------------- Physician Orders Details Patient Name: Date of Service: [name] Peru, [name] [name] RO [facility] 12/22/2021 2:00 PM Medical Record Number: [id number] Patient Account Number: [id number] Date of Birth/Sex: Treating RN: [birth year] (88 y.o. Freddy Finner Primary Care Provider: Clydie Braun Other Clinician: Referring Provider: Treating Provider/Extender: Kayren Eaves in Treatment: 1 Verbal / Phone Orders: No Diagnosis Coding Follow-up Appointments Return Appointment in 1 week. Home Health ADMIT to Home Health for wound care. May utilize formulary equivalent dressing for wound treatment orders unless otherwise specified. Home Health Nurse may visit PRN to address patients wound care needs. - [facility] [phone] Anesthetic (Use 'Patient Medications' Section for Anesthetic Order Entry) Lidocaine applied to wound bed Edema Control - Lymphedema / Segmental Compressive Device / Other Tubigrip double layer applied - size F Elevate, Exercise Daily and A void Standing for Long Periods of Time. Elevate legs to the level of the heart and pump ankles as often as possible Elevate leg(s) parallel to the floor when sitting. Wound Treatment Wound #3 - Lower Leg Wound Laterality: Left, Lateral Cleanser: Dakin 16 (oz) 0.25 1 x Per Day/30 Days Discharge Instructions: Use as directed. Prim Dressing: Gauze 1 x Per Day/30 Days ary Discharge Instructions: moistened with 1/4 Dakins Solution Secondary Dressing: ABD Pad 5x9 (in/in) 1 x Per Day/30  Days Discharge Instructions: Cover with ABD pad Secondary Dressing: Kerlix 4.5 x 4.1 (in/yd) 1 x Per Day/30 Days Discharge Instructions: Apply Kerlix 4.5 x 4.1 (in/yd) as instructed Secured With: Medipore T - 11M Medipore H Soft Cloth Surgical T  ape ape, 2x2 (in/yd) 1 x Per Day/30 Days Electronic Signature(s) Signed: 12/22/2021 3:29:26 PM By: Geralyn Corwin [name] [name] Entered By: Geralyn Corwin on 12/22/2021 15:10:21 -------------------------------------------------------------------------------- Problem List Details Patient Name: Date of Service: [name] Peru, [name] [name] RO [facility] 12/22/2021 2:00 PM Medical Record Number: [id number] Patient Account Number: [id number] Date of Birth/Sex: Treating RN: 1926-01-16 (88 y.o. Freddy Finner Primary Care Provider: Clydie Braun Other Clinician: Referring Provider: Treating Provider/Extender: Kayren Eaves in Treatment: 1 Active Problems Bevill, Jacquetta ([id number]) [id number] Page 4 of 7 ICD-10 Encounter Code Description Active Date MDM Diagnosis I87.312 Chronic venous hypertension (idiopathic) with ulcer of left lower extremity 12/15/2021 No Yes L97.828 Non-pressure chronic ulcer of other part of left lower leg with other specified 12/15/2021 No Yes severity I89.0 Lymphedema, not elsewhere classified 12/15/2021 No Yes I50.32 Chronic diastolic (congestive) heart failure 12/15/2021 No Yes Z79.01 Long term (current) use of anticoagulants 12/15/2021 No Yes Z86.718 Personal history of other venous thrombosis and embolism 12/15/2021 No Yes L03.116 Cellulitis of left lower limb 12/15/2021 No Yes Inactive Problems Resolved Problems Electronic Signature(s) Signed: 12/22/2021 3:29:26 PM By: Geralyn Corwin [name] [name] Entered By: Geralyn Corwin on 12/22/2021 15:04:32 -------------------------------------------------------------------------------- Progress Note Details Patient Name: Date of Service: [name] Peru, [name] [name] RO  [facility] 12/22/2021 2:00 PM Medical Record Number: [id number] Patient Account Number: [id number] Date of Birth/Sex: Treating RN: 1926-04-25 (88 y.o. Freddy Finner Primary Care Provider: Clydie Braun Other Clinician: Referring Provider: Treating Provider/Extender: Kayren Eaves in Treatment: 1 Subjective Chief Complaint Information obtained from Patient 12/15/2021; left lower extremity wound History of Present Illness (HPI) 06/01/17-She is here for initial evaluation of left lower extremity ulcers. She states these have been present for approximately 6-7 months; she is unclear exactly who she has followed with, states [facility], people in [place]. She reports a history of lymphedema pumps with extremely sporadic use over the last 2 years, none recently. She has been treated with Unna boots in the left lower extremity, no compression to the right lower extremity. She states her lower extremity edema is at/near baseline, I have low suspicion for DVT prior to aggressively compressing and initiating lymphdema pumps twice daily; she denies IVC filter. I see no recent ultrasound in Epic; last DVT study was to the left in February. She denies pain, low suspicion of cellulitis. VIVIKA, POYTHRESS ([id number]) [id number] Page 5 of 7 06/15/17-She is here in follow-up evaluation for bilateral lower extremity edema and left lower extremity ulcers. She was a no call/no show at last week's appointment so we have been unable to confirm her home health agency. She did go for the ordered venous ultrasound 2 weeks ago which was negative for DVT bilaterally. Her family member confirms that she was discharged from [facility] and that they ordered home health at discharge; we will reach out to them. Despite this home health has been applying unna boots to the left leg. Readmission: 02/01/2021 upon evaluation today patient appears for  reevaluation in the clinic although symptoms have been since 2019 last saw her. Subsequently the issue today is actually a wound on the medial aspect of her left heel. Fortunately I [name] [name] not see any signs of active infection at this time which is great news. No fevers, chills, nausea, vomiting, or diarrhea. Of note the patient does have a history of hypertension, congestive heart failure, and peripheral vascular disease. She is on a blood thinner, Eliquis. Although her son and the patient both were stating she was not taking anything  this was picked up on the ninth of this month according to the records from the pharmacy. This was along with the Augmentin which apparently she is also taking according to the son. Either way I am concerned about the fact that they [name] [name] not really know exactly what she is taking and how often and I want them to go home and check on this as soon as they leave here today. 02/15/2021 upon evaluation today patient appears to be doing about the same in regard to her wound. Fortunately I [name] [name] not see any signs of active infection at this time which is great news. Patient has been placed on palliative care since I last saw her according to her caregiver. Nonetheless I [name] [name] believe that the wound is showing signs of no infection which is great news. Nonetheless I still think that this is going to be somewhat difficult to heal with her ABIs the way they are. 12/15/2021 [name] is a 88 year old female with a past medical history of venous insufficiency, dementia, chronic diastolic heart failure and DVT on Eliquis that presents to the clinic for a 1-70-month history of nonhealing ulcer to the left lower extremity. She was admitted to the hospital on 11/02/2021 for a left lower extremity ulcer with cellulitis. She was treated with antibiotics and discharged on Keflex. She completed this course. Currently she has not been using compression therapy. She has been using Silvadene to the  wound bed. Son is present during the encounter. He is doing the dressing changes. There is increased erythema and warmth to the Surrounding soft tissue. No purulent drainage. 12/13; patient presents for follow-up. She states she is taking her antibiotics. She has been doing Dakin's wet-to-dry dressings. She has no issues or complaints today. She reports improvement in wound healing. She is not on a diuretic. Objective Constitutional Vitals Time Taken: 2:12 PM, Height: 70 in, Weight: 175 lbs, BMI: 25.1, Temperature: 98.6 F, Pulse: 64 bpm, Respiratory Rate: 18 breaths/min, Blood Pressure: 175/99 mmHg. General Notes: Left lower extremity: 2+ pitting edema to the knee, open wound to the lateral aspect with nonviable tissue and granulation tissue. No increased erythema and warmth to the surrounding soft tissue. No purulent drainage. Integumentary (Hair, Skin) Wound #3 status is Open. Original cause of wound was Gradually Appeared. The date acquired was: 10/27/2021. The wound has been in treatment 1 weeks. The wound is located on the Left,Lateral Lower Leg. The wound measures 8cm length x 4.5cm width x 0.8cm depth; 28.274cm^2 area and 22.619cm^3 volume. There is Fat Layer (Subcutaneous Tissue) exposed. There is no tunneling or undermining noted. There is a medium amount of serosanguineous drainage noted. There is small (1-33%) pink granulation within the wound bed. There is a large (67-100%) amount of necrotic tissue within the wound bed including Eschar and Adherent Slough. Assessment Active Problems ICD-10 Chronic venous hypertension (idiopathic) with ulcer of left lower extremity Non-pressure chronic ulcer of other part of left lower leg with other specified severity Lymphedema, not elsewhere classified Chronic diastolic (congestive) heart failure Long term (current) use of anticoagulants Personal history of other venous thrombosis and embolism Cellulitis of left lower limb Patient's wound  has shown improvement in size and appearance since last clinic visit. I recommended continuing Dakin's wet-to-dry dressings and finishing her oral antibiotics. I recommended continuing Tubigrip. And next clinic visit I think she will be ready to be wrapped. We have attempted to get home health but have not heard back. I also recommend she talk with her  primary care physician to discuss her bilateral lower extremity swelling and to assess the need for diuretics. Her son is present during the encounter. He is not sure if he can bring her to appointments. I recommended she needs aggressive wound care in order to heal her wound. He expressed understanding. He states there are no other family members available for support. Plan [place], [place] ([id number]) [id number] Page 6 of 7 Follow-up Appointments: Return Appointment in 1 week. Home Health: ADMIT to Home Health for wound care. May utilize formulary equivalent dressing for wound treatment orders unless otherwise specified. Home Health Nurse may visit PRN to address patientoos wound care needs. - [facility] [phone] Anesthetic (Use 'Patient Medications' Section for Anesthetic Order Entry): Lidocaine applied to wound bed Edema Control - Lymphedema / Segmental Compressive Device / Other: Tubigrip double layer applied - size F Elevate, Exercise Daily and Avoid Standing for Long Periods of Time. Elevate legs to the level of the heart and pump ankles as often as possible Elevate leg(s) parallel to the floor when sitting. WOUND #3: - Lower Leg Wound Laterality: Left, Lateral Cleanser: Dakin 16 (oz) 0.25 1 x Per Day/30 Days Discharge Instructions: Use as directed. Prim Dressing: Gauze 1 x Per Day/30 Days ary Discharge Instructions: moistened with 1/4 Dakins Solution Secondary Dressing: ABD Pad 5x9 (in/in) 1 x Per Day/30 Days Discharge Instructions: Cover with ABD pad Secondary Dressing: Kerlix 4.5 x 4.1 (in/yd) 1 x  Per Day/30 Days Discharge Instructions: Apply Kerlix 4.5 x 4.1 (in/yd) as instructed Secured With: Medipore T - 34M Medipore H Soft Cloth Surgical T ape ape, 2x2 (in/yd) 1 x Per Day/30 Days 1. Dakin's wet-to-dry dressings 2. Tubigrip 3. Complete oral antibiotics 4. Follow-up in 1 week Electronic Signature(s) Signed: 12/22/2021 3:29:26 PM By: Geralyn CorwinHoffman, Dhyan Noah [name] [name] Entered By: Geralyn CorwinHoffman, Jasen Hartstein on 12/22/2021 15:09:58 -------------------------------------------------------------------------------- SuperBill Details Patient Name: Date of Service: [name] PeruBA LDWIN, [name]21 Medical Record Number: [id number] Patient Account Number: [id number] Date of Birth/Sex: Treating RN: [birth year] (88 y.o. Freddy FinnerF) Epps, Carrie Primary Care Provider: Clydie BraunFItzgerald, David Other Clinician: Referring Provider: Treating Provider/Extender: Kayren EavesHoffman, Isaiyah Feldhaus FItzgerald, David Weeks in Treatment: 1 Diagnosis Coding ICD-10 Codes Code Description [phone] Chronic venous hypertension (idiopathic) with ulcer of left lower extremity L97.828 Non-pressure chronic ulcer of other part of left lower leg with other specified severity I89.0 Lymphedema, not elsewhere classified I50.32 Chronic diastolic (congestive) heart failure Z79.01 Long term (current) use of anticoagulants Z86.718 Personal history of other venous thrombosis and embolism L03.116 Cellulitis of left lower limb Facility Procedures Physician Procedures : CPT4 Code Description Modifier [id number] 99213 - WC PHYS LEVEL 3 - EST PT ICD-10 Diagnosis Description I87.312 Chronic venous hypertension (idiopathic) with ulcer of left lower extremity L97.828 Non-pressure chronic ulcer of other part of left lower leg  with other specified severity I89.0 Lymphedema, not elsewhere classified L03.116 Cellulitis of left lower limb Quantity: 1 Electronic Signature(s) Signed: 12/22/2021 3:29:26 PM By: Geralyn CorwinHoffman, Manvir Prabhu [name] [name] Entered By: Geralyn CorwinHoffman, Chelsi Warr on 12/22/2021 15:10:15

## 2021-12-22 NOTE — Progress Notes (Addendum)
[name] [name] ([id number]) [id number] Page 1 of 7 Visit Report for 12/22/2021 Arrival Information Details Patient Name: Date of Service: [name] [name], [place] RO [name] 12/22/2021 2:00 PM Medical Record Number: [id number] Patient Account Number: [id number] Date of Birth/Sex: Treating RN: 08-Oct-1926 (88 y.o. [name] [name] Primary Care [name] [name]: [name] [name] Other Clinician: Referring [name] [name]: Treating [name] [name]: [name] [name] in Treatment: 1 Visit Information History Since Last Visit Added or deleted any medications: No Patient Arrived: Wheel Chair Any new allergies or adverse reactions: No Arrival Time: 14:11 Had a fall or experienced change in No Accompanied By: son activities of daily living that may affect Transfer Assistance: None risk of falls: Patient Identification Verified: Yes Signs or symptoms of abuse/neglect since last visito No Secondary Verification Process Completed: Yes Hospitalized since last visit: No Patient Requires Transmission-Based Precautions: No Implantable device outside of the clinic excluding No Patient Has Alerts: Yes cellular tissue based products placed in the [name] Patient Alerts: Patient on Blood Thinner since last visit: ABI L .90 10/24/203 Has Dressing in Place as Prescribed: Yes ABI R .97 11/02/2021 Has Compression in Place as Prescribed: Yes Pain Present Now: No Electronic Signature(s) Signed: 12/22/2021 3:37:15 PM By: Carlene Coria RN Entered By: Carlene Coria on 12/22/2021 14:12:18 -------------------------------------------------------------------------------- Clinic Level of Care Assessment Details Patient Name: Date of Service: [name] [name] [name] 12/22/2021 2:00 PM Medical Record Number: [id number] Patient Account Number: [id number] Date of Birth/Sex: Treating RN: 1926-03-05 (88 y.o. [name] [name] Primary Care [name] [name]: [name] [name] Other Clinician: Referring  [name] [name]: Treating [name] [name]/Extender: [name] [name] in Treatment: 1 Clinic Level of Care Assessment Items TOOL 4 Quantity Score X- 1 0 Use when only an EandM is performed on FOLLOW-UP visit ASSESSMENTS - Nursing Assessment / Reassessment X- 1 10 Reassessment of Co-morbidities (includes updates in patient status) X- 1 5 Reassessment of Adherence to Treatment Plan ASSESSMENTS - Wound and Skin A ssessment / Reassessment X - Simple Wound Assessment / Reassessment - one wound 1 5 []  - 0 Complex Wound Assessment / Reassessment - multiple wounds []  - 0 Dermatologic / Skin Assessment (not related to wound area) ASSESSMENTS - Focused Assessment []  - 0 Circumferential Edema Measurements - multi extremities []  - 0 Nutritional Assessment / Counseling / Intervention []  - 0 Lower Extremity Assessment (monofilament, tuning fork, pulses) []  - 0 Peripheral Arterial Disease Assessment (using hand held doppler) ASSESSMENTS - Ostomy and/or Continence Assessment and Care []  - 0 Incontinence Assessment and Management []  - 0 Ostomy Care Assessment and Management (repouching, etc.) PROCESS - Coordination of Care [place], August ([id number]) [id number] Page 2 of 7 X- 1 15 Simple Patient / Family Education for ongoing care []  - 0 Complex (extensive) Patient / Family Education for ongoing care []  - 0 Staff obtains Programmer, systems, Records, T Results / Process Orders est []  - 0 Staff telephones HHA, Nursing Homes / Clarify orders / etc []  - 0 Routine Transfer to another Facility (non-emergent condition) []  - 0 Routine Hospital Admission (non-emergent condition) []  - 0 New Admissions / Biomedical engineer / Ordering NPWT Apligraf, etc. , []  - 0 Emergency Hospital Admission (emergent condition) X- 1 10 Simple Discharge Coordination []  - 0 Complex (extensive) Discharge Coordination PROCESS - Special Needs []  - 0 Pediatric / Minor Patient  Management []  - 0 Isolation Patient Management []  - 0 Hearing / Language / Visual special needs []  - 0 Assessment of Community assistance (transportation, D/C planning, etc.) []  - 0 Additional assistance / Altered mentation []  -  0 Support Surface(s) Assessment (bed, cushion, seat, etc.) INTERVENTIONS - Wound Cleansing / Measurement X - Simple Wound Cleansing - one wound 1 5 []  - 0 Complex Wound Cleansing - multiple wounds X- 1 5 Wound Imaging (photographs - any number of wounds) []  - 0 Wound Tracing (instead of photographs) X- 1 5 Simple Wound Measurement - one wound []  - 0 Complex Wound Measurement - multiple wounds INTERVENTIONS - Wound Dressings X - Small Wound Dressing one or multiple wounds 1 10 []  - 0 Medium Wound Dressing one or multiple wounds []  - 0 Large Wound Dressing one or multiple wounds []  - 0 Application of Medications - topical []  - 0 Application of Medications - injection INTERVENTIONS - Miscellaneous []  - 0 External ear exam []  - 0 Specimen Collection (cultures, biopsies, blood, body fluids, etc.) []  - 0 Specimen(s) / Culture(s) sent or taken to Lab for analysis []  - 0 Patient Transfer (multiple staff / Civil Service fast streamer / Similar devices) []  - 0 Simple Staple / Suture removal (25 or less) []  - 0 Complex Staple / Suture removal (26 or more) []  - 0 Hypo / Hyperglycemic Management (close monitor of Blood Glucose) []  - 0 Ankle / Brachial Index (ABI) - [name] [name] not check if billed separately X- 1 5 Vital Signs Has the patient been seen at the hospital within the last three years: Yes Total Score: 75 Level Of Care: New/Established - Level 2 Electronic Signature(s) Signed: 12/22/2021 3:37:15 PM By: Carlene Coria RN [name] [name], [name] [name] ([id number]) [id number] Page 3 of 7 Entered By: Carlene Coria on 12/22/2021 14:22:06 -------------------------------------------------------------------------------- Encounter Discharge Information  Details Patient Name: Date of Service: [name] [name], [place] RO [facility] 12/22/2021 2:00 PM Medical Record Number: [id number] Patient Account Number: [id number] Date of Birth/Sex: Treating RN: 06/12/1926 (88 y.o. [name] [name] Primary Care Tavarius Grewe: [name] [name] Other Clinician: Referring [name] [name]: Treating Bret Stamour/Extender: [name] [name] in Treatment: 1 Encounter Discharge Information Items Discharge Condition: Stable Ambulatory Status: Wheelchair Discharge Destination: Home Transportation: Private Auto Accompanied By: son Schedule Follow-up Appointment: Yes Clinical Summary of Care: Electronic Signature(s) Signed: 12/22/2021 3:37:15 PM By: Carlene Coria RN Entered By: Carlene Coria on 12/22/2021 14:23:20 -------------------------------------------------------------------------------- Lower Extremity Assessment Details Patient Name: Date of Service: [name] Jolly, [name] [name] RO [name] 12/22/2021 2:00 PM Medical Record Number: [id number] Patient Account Number: [id number] Date of Birth/Sex: Treating RN: [birth year] (88 y.o. [name] [name] Primary Care Aneliz Carbary: [name] [name] Other Clinician: Referring [name] [name]: Treating Natanel Snavely/Extender: [name] [name] in Treatment: 1 Edema Assessment Assessed: [name] [name]: No] [Right: No] Edema: [Left: Ye] [Right: s] Calf Left: Right: Point of Measurement: 35 cm From Medial Instep 52 cm Ankle Left: Right: Point of Measurement: 11 cm From Medial Instep 28 cm Vascular Assessment Pulses: Dorsalis Pedis Palpable: [Left:Yes] Electronic Signature(s) Signed: 12/22/2021 3:37:15 PM By: Carlene Coria RN Entered By: Carlene Coria on 12/22/2021 14:17:00 -------------------------------------------------------------------------------- Multi Wound Chart Details Patient Name: Date of Service: [name] Jolly, [name] [name] RO [facility] 12/22/2021 2:00 PM Medical Record Number: [id number] Patient Account Number: [id number] Date of Birth/Sex:  Treating RN: 05-Mar-1926 (88 y.o. [name] [name] Primary Care Srihari Shellhammer: [name] [name] Other Clinician: Referring Morgen Ritacco: Treating Shaquandra Galano/Extender: Bonita Quin [place], [place] ([id number]) [phone].pdf Page 4 of 7 Weeks in Treatment: 1 Vital Signs Height(in): 70 Pulse(bpm): 64 Weight(lbs): 175 Blood Pressure(mmHg): 175/99 Body Mass Index(BMI): 25.1 Temperature(F): 98.6 Respiratory Rate(breaths/min): 18 [3:Photos:] [N/A:N/A] Left, Lateral Lower Leg N/A N/A Wound Location: Gradually Appeared N/A N/A Wounding Event: Venous Leg Ulcer N/A N/A Primary Etiology: Cataracts, Lymphedema, N/A  N/A Comorbid History: Hypertension, Peripheral Venous Disease, Osteoarthritis 10/27/2021 N/A N/A Date Acquired: 1 N/A N/A Weeks of Treatment: Open N/A N/A Wound Status: No N/A N/A Wound Recurrence: 8x4.5x0.8 N/A N/A Measurements L x W x D (cm) 28.274 N/A N/A A (cm) : rea 22.619 N/A N/A Volume (cm) : 5.20% N/A N/A % Reduction in Area: 5.20% N/A N/A % Reduction in Volume: Full Thickness Without Exposed N/A N/A Classification: Support Structures Medium N/A N/A Exudate A mount: Serosanguineous N/A N/A Exudate Type: red, brown N/A N/A Exudate Color: Small (1-33%) N/A N/A Granulation A mount: Pink N/A N/A Granulation Quality: Large (67-100%) N/A N/A Necrotic A mount: Eschar, Adherent Slough N/A N/A Necrotic Tissue: Fat Layer (Subcutaneous Tissue): Yes N/A N/A Exposed Structures: None N/A N/A Epithelialization: Treatment Notes Electronic Signature(s) Signed: 12/22/2021 3:37:15 PM By: Carlene Coria RN Entered By: Carlene Coria on 12/22/2021 14:17:21 -------------------------------------------------------------------------------- Multi-Disciplinary Care Plan Details Patient Name: Date of Service: [name] Jolly, [name] [name] RO [name] 12/22/2021 2:00 PM Medical Record Number: [id number] Patient Account Number: [id number] Date of  Birth/Sex: Treating RN: 14-Jun-1926 (88 y.o. [name] [name] Primary Care Eoin Willden: [name] [name] Other Clinician: Referring Keelyn Fjelstad: Treating Ireoluwa Grant/Extender: [name] [name] in Treatment: 1 Active Inactive Electronic Signature(s) Signed: 02/09/2022 7:50:09 PM By: Gretta Cool BSN, RN, CWS, Kim RN, BSN Signed: 04/05/2022 8:45:10 AM By: Carlene Coria RN Previous Signature: 12/22/2021 3:37:15 PM Version By: Carlene Coria RN Entered By: Gretta Cool BSN, RN, CWS, Kim on 02/09/2022 19:50:09 [place], [place] ([id number]) [id number] Page 5 of 7 -------------------------------------------------------------------------------- Pain Assessment Details Patient Name: Date of Service: [name] [name] [facility] 12/22/2021 2:00 PM Medical Record Number: [id number] Patient Account Number: [id number] Date of Birth/Sex: Treating RN: 1926-05-16 (88 y.o. [name] [name] Primary Care Catarina Huntley: [name] [name] Other Clinician: Referring Gohan Collister: Treating Jaaziah Schulke/Extender: [name] [name] in Treatment: 1 Active Problems Location of Pain Severity and Description of Pain Patient Has Paino No Site Locations Pain Management and Medication Current Pain Management: Electronic Signature(s) Signed: 12/22/2021 3:37:15 PM By: Carlene Coria RN Entered By: Carlene Coria on 12/22/2021 14:12:40 -------------------------------------------------------------------------------- Patient/Caregiver Education Details Patient Name: Date of Service: [name] Jolly, [name] [name] RO [facility] 12/13/2023andnbsp2:00 PM Medical Record Number: [id number] Patient Account Number: [id number] Date of Birth/Gender: Treating RN: [birth year] (88 y.o. [name] [name] Primary Care Physician: [name] [name] Other Clinician: Referring Physician: Treating Physician/Extender: [name] [name] in Treatment: 1 Education Assessment Education Provided To: Patient Education  Topics Provided Wound Debridement: Methods: Explain/Verbal Responses: State content correctly Electronic Signature(s) Signed: 12/22/2021 3:37:15 PM By: Carlene Coria RN Entered By: Carlene Coria on 12/22/2021 14:22:41 [place], Thekla ([id number]) [id number] Page 6 of 7 -------------------------------------------------------------------------------- Wound Assessment Details Patient Name: Date of Service: [name] [name] [facility] 12/22/2021 2:00 PM Medical Record Number: [id number] Patient Account Number: [id number] Date of Birth/Sex: Treating RN: [birth year] (88 y.o. [name] [name] Primary Care Keyanni Whittinghill: [name] [name] Other Clinician: Referring Dylana Shaw: Treating Natally Ribera/Extender: [name] [name] in Treatment: 1 Wound Status Wound Number: 3 Primary Venous Leg Ulcer Etiology: Wound Location: Left, Lateral Lower Leg Wound Open Wounding Event: Gradually Appeared Status: Date Acquired: 10/27/2021 Comorbid Cataracts, Lymphedema, Hypertension, Peripheral Venous Weeks Of Treatment: 1 History: Disease, Osteoarthritis Clustered Wound: No Photos Wound Measurements Length: (cm) 8 Width: (cm) 4.5 Depth: (cm) 0.8 Area: (cm) 28.274 Volume: (cm) 22.619 % Reduction in Area: 5.2% % Reduction in Volume: 5.2% Epithelialization: None Tunneling: No Undermining: No Wound Description Classification: Full Thickness Without Exposed Support Exudate Amount: Medium Exudate Type: Serosanguineous Exudate Color: red, brown  Structures Foul Odor After Cleansing: No Slough/Fibrino Yes Wound Bed Granulation Amount: Small (1-33%) Exposed Structure Granulation Quality: Pink Fat Layer (Subcutaneous Tissue) Exposed: Yes Necrotic Amount: Large (67-100%) Necrotic Quality: Eschar, Adherent Therapist, music) Signed: 12/22/2021 3:37:15 PM By: Carlene Coria RN Entered By: Carlene Coria on 12/22/2021  14:16:15 -------------------------------------------------------------------------------- Vitals Details Patient Name: Date of Service: [name] Jolly, [name] [name] RO [facility] 12/22/2021 2:00 PM Medical Record Number: [id number] Patient Account Number: [id number] Date of Birth/Sex: Treating RN: [birth year] (88 y.o. [name] [name] Primary Care Roselee Tayloe: [name] [name] Other Clinician: Referring Mayla Biddy: Treating Daruis Swaim/Extender: [name] [name] in Treatment: 1 Vital Signs Time Taken: 14:12 Temperature (F): 98.6 Height (in): 70 Pulse (bpm): 64 Weight (lbs): 175 Respiratory Rate (breaths/min): 18 [place], [name] [name] ([id number]) [id number] Page 7 of 7 Body Mass Index (BMI): 25.1 Blood Pressure (mmHg): 175/99 Reference Range: 80 - 120 mg / dl Electronic Signature(s) Signed: 12/22/2021 3:37:15 PM By: Carlene Coria RN Entered By: Carlene Coria on 12/22/2021 14:12:34

## 2021-12-25 DIAGNOSIS — I110 Hypertensive heart disease with heart failure: Secondary | ICD-10-CM | POA: Diagnosis not present

## 2021-12-25 DIAGNOSIS — I739 Peripheral vascular disease, unspecified: Secondary | ICD-10-CM | POA: Diagnosis not present

## 2021-12-25 DIAGNOSIS — L03116 Cellulitis of left lower limb: Secondary | ICD-10-CM | POA: Diagnosis not present

## 2021-12-25 DIAGNOSIS — L97821 Non-pressure chronic ulcer of other part of left lower leg limited to breakdown of skin: Secondary | ICD-10-CM | POA: Diagnosis not present

## 2021-12-25 DIAGNOSIS — F0390 Unspecified dementia without behavioral disturbance: Secondary | ICD-10-CM | POA: Diagnosis not present

## 2021-12-25 DIAGNOSIS — I5032 Chronic diastolic (congestive) heart failure: Secondary | ICD-10-CM | POA: Diagnosis not present

## 2021-12-25 DIAGNOSIS — I87312 Chronic venous hypertension (idiopathic) with ulcer of left lower extremity: Secondary | ICD-10-CM | POA: Diagnosis not present

## 2021-12-25 DIAGNOSIS — I890 Lymphedema, not elsewhere classified: Secondary | ICD-10-CM | POA: Diagnosis not present

## 2021-12-25 DIAGNOSIS — H269 Unspecified cataract: Secondary | ICD-10-CM | POA: Diagnosis not present

## 2021-12-26 IMAGING — DX DG CHEST 1V PORT
1 series · 1 of 1 positions shown · non-contrast
Comparison: 04/06/2020

CLINICAL DATA: Weakness

EXAM:
PORTABLE CHEST 1 VIEW

[chest ap]
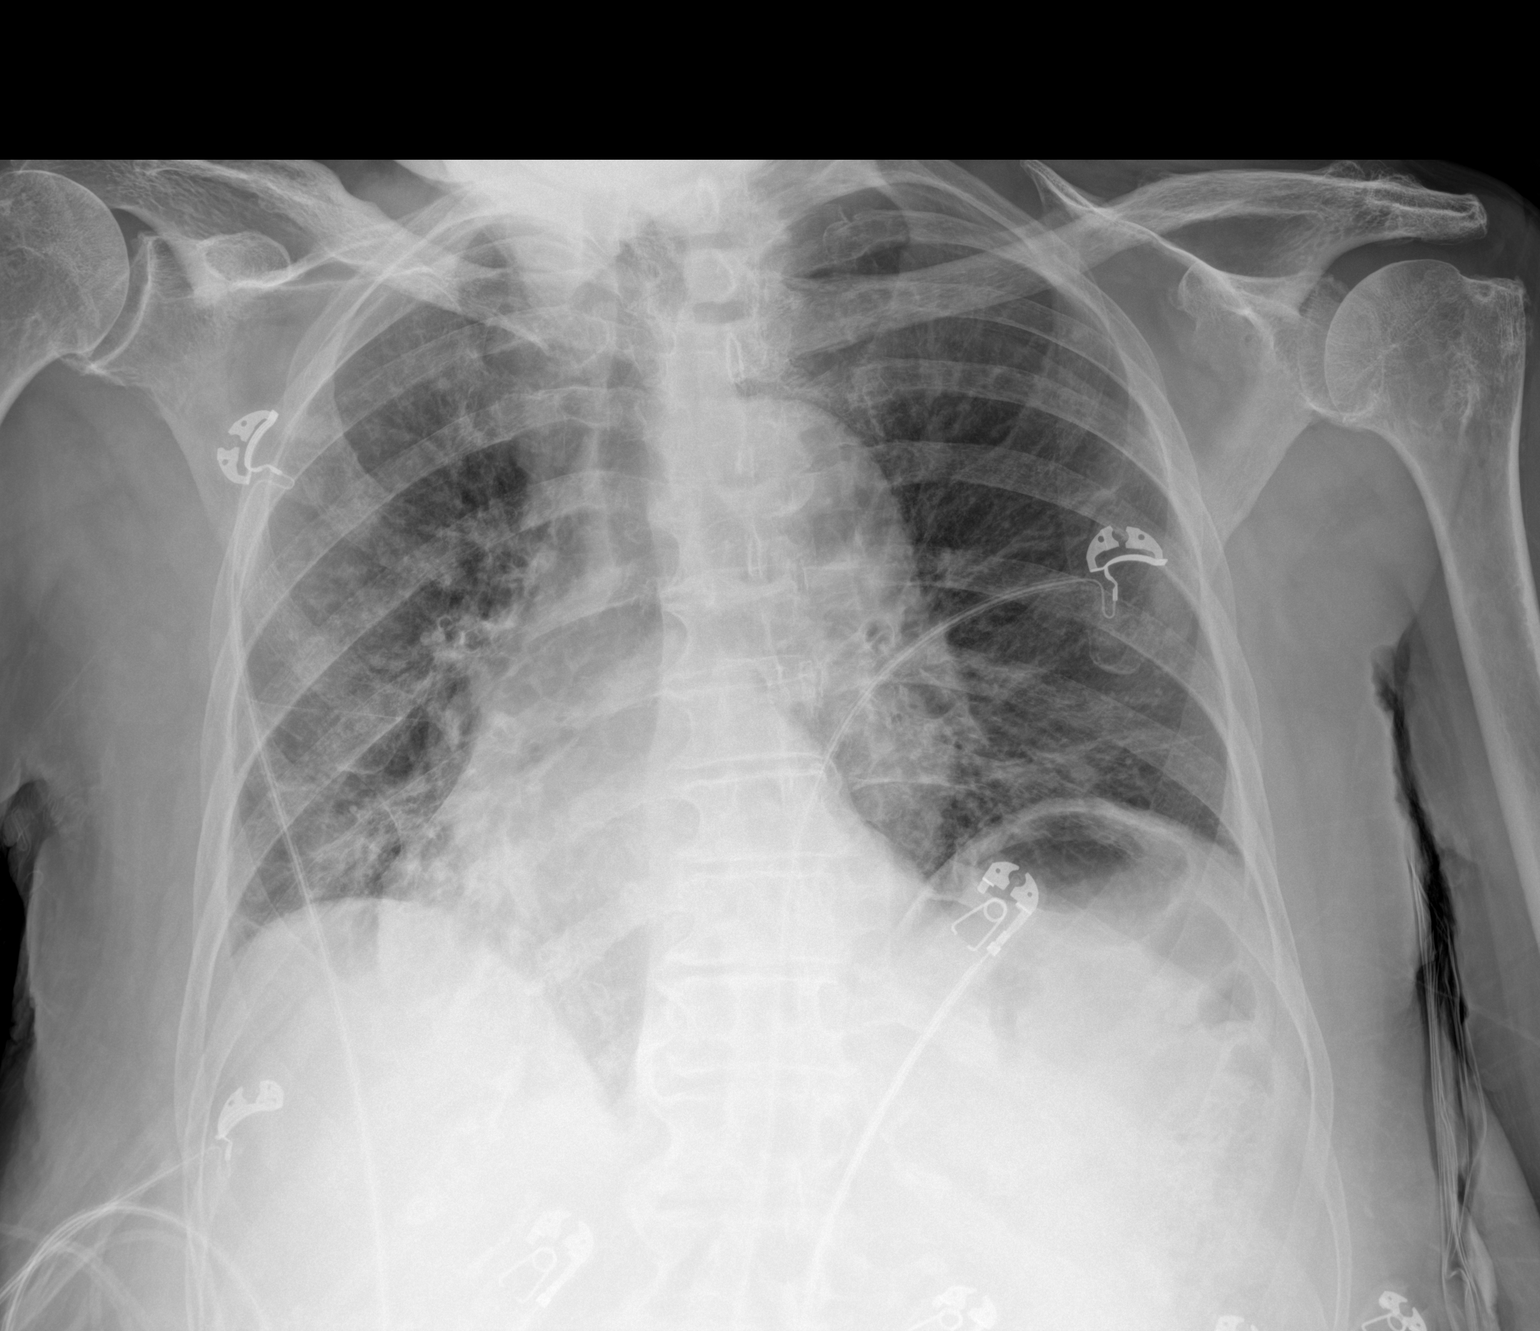

[1 of 1 positions shown; findings below may reference images not displayed]

FINDINGS: Streaky perihilar and basilar opacity. No pleural effusion. Stable
cardiomediastinal silhouette. No pneumothorax
IMPRESSION: Streaky perihilar and basilar opacity suggestive of pneumonia,
possible atypical or viral process.

## 2021-12-27 ENCOUNTER — Ambulatory Visit: Payer: Medicare HMO | Admitting: Physician Assistant

## 2021-12-29 ENCOUNTER — Ambulatory Visit: Payer: Medicare HMO | Admitting: Internal Medicine

## 2022-01-12 ENCOUNTER — Ambulatory Visit: Payer: Medicare HMO | Admitting: Internal Medicine

## 2022-01-14 ENCOUNTER — Emergency Department: Payer: Medicare HMO

## 2022-01-14 ENCOUNTER — Inpatient Hospital Stay
Admission: EM | Admit: 2022-01-14 | Discharge: 2022-01-19 | DRG: 091 | Disposition: A | Discharge status: Hospice | Payer: Medicare HMO | Attending: Internal Medicine | Admitting: Internal Medicine

## 2022-01-14 ENCOUNTER — Other Ambulatory Visit: Payer: Self-pay

## 2022-01-14 DIAGNOSIS — N1832 Chronic kidney disease, stage 3b: Secondary | ICD-10-CM | POA: Diagnosis present

## 2022-01-14 DIAGNOSIS — R680 Hypothermia, not associated with low environmental temperature: Secondary | ICD-10-CM | POA: Diagnosis present

## 2022-01-14 DIAGNOSIS — Z20822 Contact with and (suspected) exposure to covid-19: Secondary | ICD-10-CM | POA: Diagnosis present

## 2022-01-14 DIAGNOSIS — I878 Other specified disorders of veins: Secondary | ICD-10-CM | POA: Diagnosis present

## 2022-01-14 DIAGNOSIS — T68XXXA Hypothermia, initial encounter: Secondary | ICD-10-CM

## 2022-01-14 DIAGNOSIS — R531 Weakness: Secondary | ICD-10-CM | POA: Diagnosis not present

## 2022-01-14 DIAGNOSIS — I82513 Chronic embolism and thrombosis of femoral vein, bilateral: Secondary | ICD-10-CM | POA: Diagnosis present

## 2022-01-14 DIAGNOSIS — D631 Anemia in chronic kidney disease: Secondary | ICD-10-CM | POA: Diagnosis present

## 2022-01-14 DIAGNOSIS — R001 Bradycardia, unspecified: Secondary | ICD-10-CM | POA: Diagnosis present

## 2022-01-14 DIAGNOSIS — R627 Adult failure to thrive: Secondary | ICD-10-CM | POA: Diagnosis present

## 2022-01-14 DIAGNOSIS — M6258 Muscle wasting and atrophy, not elsewhere classified, other site: Secondary | ICD-10-CM | POA: Diagnosis not present

## 2022-01-14 DIAGNOSIS — R768 Other specified abnormal immunological findings in serum: Secondary | ICD-10-CM | POA: Diagnosis present

## 2022-01-14 DIAGNOSIS — I890 Lymphedema, not elsewhere classified: Secondary | ICD-10-CM | POA: Diagnosis present

## 2022-01-14 DIAGNOSIS — I5032 Chronic diastolic (congestive) heart failure: Secondary | ICD-10-CM | POA: Diagnosis present

## 2022-01-14 DIAGNOSIS — M47814 Spondylosis without myelopathy or radiculopathy, thoracic region: Secondary | ICD-10-CM | POA: Diagnosis not present

## 2022-01-14 DIAGNOSIS — R0902 Hypoxemia: Secondary | ICD-10-CM | POA: Diagnosis present

## 2022-01-14 DIAGNOSIS — J90 Pleural effusion, not elsewhere classified: Secondary | ICD-10-CM | POA: Diagnosis not present

## 2022-01-14 DIAGNOSIS — I10 Essential (primary) hypertension: Secondary | ICD-10-CM | POA: Diagnosis present

## 2022-01-14 DIAGNOSIS — I2510 Atherosclerotic heart disease of native coronary artery without angina pectoris: Secondary | ICD-10-CM | POA: Diagnosis not present

## 2022-01-14 DIAGNOSIS — Z7901 Long term (current) use of anticoagulants: Secondary | ICD-10-CM

## 2022-01-14 DIAGNOSIS — Z833 Family history of diabetes mellitus: Secondary | ICD-10-CM

## 2022-01-14 DIAGNOSIS — G9340 Encephalopathy, unspecified: Secondary | ICD-10-CM | POA: Diagnosis not present

## 2022-01-14 DIAGNOSIS — R410 Disorientation, unspecified: Secondary | ICD-10-CM | POA: Diagnosis not present

## 2022-01-14 DIAGNOSIS — E611 Iron deficiency: Secondary | ICD-10-CM | POA: Diagnosis present

## 2022-01-14 DIAGNOSIS — B1920 Unspecified viral hepatitis C without hepatic coma: Secondary | ICD-10-CM | POA: Diagnosis present

## 2022-01-14 DIAGNOSIS — F0390 Unspecified dementia without behavioral disturbance: Secondary | ICD-10-CM | POA: Diagnosis present

## 2022-01-14 DIAGNOSIS — N179 Acute kidney failure, unspecified: Secondary | ICD-10-CM | POA: Diagnosis present

## 2022-01-14 DIAGNOSIS — J9601 Acute respiratory failure with hypoxia: Secondary | ICD-10-CM

## 2022-01-14 DIAGNOSIS — S86012A Strain of left Achilles tendon, initial encounter: Secondary | ICD-10-CM | POA: Diagnosis not present

## 2022-01-14 DIAGNOSIS — Z515 Encounter for palliative care: Secondary | ICD-10-CM | POA: Diagnosis not present

## 2022-01-14 DIAGNOSIS — M19012 Primary osteoarthritis, left shoulder: Secondary | ICD-10-CM | POA: Diagnosis not present

## 2022-01-14 DIAGNOSIS — R5381 Other malaise: Secondary | ICD-10-CM | POA: Diagnosis not present

## 2022-01-14 DIAGNOSIS — Z7189 Other specified counseling: Secondary | ICD-10-CM | POA: Diagnosis not present

## 2022-01-14 DIAGNOSIS — L89323 Pressure ulcer of left buttock, stage 3: Secondary | ICD-10-CM | POA: Diagnosis present

## 2022-01-14 DIAGNOSIS — Z66 Do not resuscitate: Secondary | ICD-10-CM | POA: Diagnosis present

## 2022-01-14 DIAGNOSIS — I82409 Acute embolism and thrombosis of unspecified deep veins of unspecified lower extremity: Secondary | ICD-10-CM | POA: Diagnosis present

## 2022-01-14 DIAGNOSIS — M1712 Unilateral primary osteoarthritis, left knee: Secondary | ICD-10-CM | POA: Diagnosis not present

## 2022-01-14 DIAGNOSIS — Z79899 Other long term (current) drug therapy: Secondary | ICD-10-CM

## 2022-01-14 DIAGNOSIS — R4182 Altered mental status, unspecified: Secondary | ICD-10-CM | POA: Diagnosis not present

## 2022-01-14 DIAGNOSIS — I872 Venous insufficiency (chronic) (peripheral): Secondary | ICD-10-CM | POA: Diagnosis present

## 2022-01-14 DIAGNOSIS — R7989 Other specified abnormal findings of blood chemistry: Secondary | ICD-10-CM | POA: Diagnosis present

## 2022-01-14 DIAGNOSIS — N7689 Other specified inflammation of vagina and vulva: Secondary | ICD-10-CM | POA: Diagnosis present

## 2022-01-14 DIAGNOSIS — I82532 Chronic embolism and thrombosis of left popliteal vein: Secondary | ICD-10-CM | POA: Diagnosis present

## 2022-01-14 DIAGNOSIS — M19011 Primary osteoarthritis, right shoulder: Secondary | ICD-10-CM | POA: Diagnosis not present

## 2022-01-14 DIAGNOSIS — I82541 Chronic embolism and thrombosis of right tibial vein: Secondary | ICD-10-CM | POA: Diagnosis present

## 2022-01-14 DIAGNOSIS — J9811 Atelectasis: Secondary | ICD-10-CM | POA: Diagnosis present

## 2022-01-14 DIAGNOSIS — G931 Anoxic brain damage, not elsewhere classified: Principal | ICD-10-CM | POA: Diagnosis present

## 2022-01-14 DIAGNOSIS — M7989 Other specified soft tissue disorders: Secondary | ICD-10-CM | POA: Diagnosis present

## 2022-01-14 DIAGNOSIS — R7401 Elevation of levels of liver transaminase levels: Secondary | ICD-10-CM | POA: Diagnosis present

## 2022-01-14 DIAGNOSIS — G319 Degenerative disease of nervous system, unspecified: Secondary | ICD-10-CM | POA: Diagnosis not present

## 2022-01-14 DIAGNOSIS — R946 Abnormal results of thyroid function studies: Secondary | ICD-10-CM | POA: Diagnosis present

## 2022-01-14 DIAGNOSIS — F4489 Other dissociative and conversion disorders: Secondary | ICD-10-CM | POA: Diagnosis not present

## 2022-01-14 DIAGNOSIS — E875 Hyperkalemia: Secondary | ICD-10-CM | POA: Diagnosis not present

## 2022-01-14 DIAGNOSIS — R400 Somnolence: Secondary | ICD-10-CM | POA: Diagnosis not present

## 2022-01-14 DIAGNOSIS — Z8249 Family history of ischemic heart disease and other diseases of the circulatory system: Secondary | ICD-10-CM

## 2022-01-14 DIAGNOSIS — L03116 Cellulitis of left lower limb: Secondary | ICD-10-CM | POA: Diagnosis present

## 2022-01-14 DIAGNOSIS — I130 Hypertensive heart and chronic kidney disease with heart failure and stage 1 through stage 4 chronic kidney disease, or unspecified chronic kidney disease: Secondary | ICD-10-CM | POA: Diagnosis present

## 2022-01-14 DIAGNOSIS — L97909 Non-pressure chronic ulcer of unspecified part of unspecified lower leg with unspecified severity: Secondary | ICD-10-CM | POA: Diagnosis present

## 2022-01-14 DIAGNOSIS — L97925 Non-pressure chronic ulcer of unspecified part of left lower leg with muscle involvement without evidence of necrosis: Secondary | ICD-10-CM

## 2022-01-14 DIAGNOSIS — L97822 Non-pressure chronic ulcer of other part of left lower leg with fat layer exposed: Secondary | ICD-10-CM | POA: Diagnosis present

## 2022-01-14 DIAGNOSIS — R600 Localized edema: Secondary | ICD-10-CM | POA: Diagnosis not present

## 2022-01-14 DIAGNOSIS — J849 Interstitial pulmonary disease, unspecified: Secondary | ICD-10-CM | POA: Diagnosis not present

## 2022-01-14 LAB — URINALYSIS, COMPLETE (UACMP) WITH MICROSCOPIC
Bilirubin Urine: NEGATIVE
Glucose, UA: NEGATIVE mg/dL
Hgb urine dipstick: NEGATIVE
Ketones, ur: NEGATIVE mg/dL
Nitrite: NEGATIVE
Protein, ur: NEGATIVE mg/dL
Specific Gravity, Urine: 1.018 (ref 1.005–1.030)
pH: 5 (ref 5.0–8.0)

## 2022-01-14 LAB — T4, FREE: Free T4: 1.07 ng/dL (ref 0.61–1.12)

## 2022-01-14 LAB — CBC WITH DIFFERENTIAL/PLATELET
Abs Immature Granulocytes: 0.02 10*3/uL (ref 0.00–0.07)
Basophils Absolute: 0 10*3/uL (ref 0.0–0.1)
Basophils Relative: 0 %
Eosinophils Absolute: 0 10*3/uL (ref 0.0–0.5)
Eosinophils Relative: 1 %
HCT: 34.3 % — ABNORMAL LOW (ref 36.0–46.0)
Hemoglobin: 10.1 g/dL — ABNORMAL LOW (ref 12.0–15.0)
Immature Granulocytes: 0 %
Lymphocytes Relative: 13 %
Lymphs Abs: 0.6 10*3/uL — ABNORMAL LOW (ref 0.7–4.0)
MCH: 28.9 pg (ref 26.0–34.0)
MCHC: 29.4 g/dL — ABNORMAL LOW (ref 30.0–36.0)
MCV: 98.3 fL (ref 80.0–100.0)
Monocytes Absolute: 0.4 10*3/uL (ref 0.1–1.0)
Monocytes Relative: 8 %
Neutro Abs: 3.9 10*3/uL (ref 1.7–7.7)
Neutrophils Relative %: 78 %
Platelets: 230 10*3/uL (ref 150–400)
RBC: 3.49 MIL/uL — ABNORMAL LOW (ref 3.87–5.11)
RDW: 14 % (ref 11.5–15.5)
WBC: 4.9 10*3/uL (ref 4.0–10.5)
nRBC: 0 % (ref 0.0–0.2)

## 2022-01-14 LAB — COMPREHENSIVE METABOLIC PANEL
ALT: 37 U/L (ref 0–44)
AST: 48 U/L — ABNORMAL HIGH (ref 15–41)
Albumin: 3.2 g/dL — ABNORMAL LOW (ref 3.5–5.0)
Alkaline Phosphatase: 75 U/L (ref 38–126)
Anion gap: 7 (ref 5–15)
BUN: 37 mg/dL — ABNORMAL HIGH (ref 8–23)
CO2: 23 mmol/L (ref 22–32)
Calcium: 9 mg/dL (ref 8.9–10.3)
Chloride: 113 mmol/L — ABNORMAL HIGH (ref 98–111)
Creatinine, Ser: 1.26 mg/dL — ABNORMAL HIGH (ref 0.44–1.00)
GFR, Estimated: 40 mL/min — ABNORMAL LOW (ref >60)
Glucose, Bld: 84 mg/dL (ref 70–99)
Potassium: 5.1 mmol/L (ref 3.5–5.1)
Sodium: 143 mmol/L (ref 135–145)
Total Bilirubin: 0.7 mg/dL (ref 0.3–1.2)
Total Protein: 6.8 g/dL (ref 6.5–8.1)

## 2022-01-14 LAB — TROPONIN I (HIGH SENSITIVITY)
Troponin I (High Sensitivity): 19 ng/L — ABNORMAL HIGH (ref <18)
Troponin I (High Sensitivity): 18 ng/L — ABNORMAL HIGH (ref <18)

## 2022-01-14 LAB — PROTIME-INR
INR: 1.3 — ABNORMAL HIGH (ref 0.8–1.2)
Prothrombin Time: 15.7 seconds — ABNORMAL HIGH (ref 11.4–15.2)

## 2022-01-14 LAB — RESP PANEL BY RT-PCR (RSV, FLU A&B, COVID)  RVPGX2
Influenza A by PCR: NEGATIVE
Influenza B by PCR: NEGATIVE
Resp Syncytial Virus by PCR: NEGATIVE
SARS Coronavirus 2 by RT PCR: NEGATIVE

## 2022-01-14 LAB — APTT: aPTT: 44 seconds — ABNORMAL HIGH (ref 24–36)

## 2022-01-14 LAB — TSH: TSH: 6.609 u[IU]/mL — ABNORMAL HIGH (ref 0.350–4.500)

## 2022-01-14 LAB — BRAIN NATRIURETIC PEPTIDE: B Natriuretic Peptide: 604.4 pg/mL — ABNORMAL HIGH (ref 0.0–100.0)

## 2022-01-14 LAB — CORTISOL: Cortisol, Plasma: 16.4 ug/dL

## 2022-01-14 LAB — LACTIC ACID, PLASMA
Lactic Acid, Venous: 1.5 mmol/L (ref 0.5–1.9)
Lactic Acid, Venous: 1.4 mmol/L (ref 0.5–1.9)

## 2022-01-14 MED ORDER — ONDANSETRON HCL 4 MG PO TABS: 4.0000 mg | ORAL_TABLET | Freq: Four times a day (QID) | ORAL | Status: DC | PRN

## 2022-01-14 MED ORDER — SODIUM CHLORIDE 0.9 % IV BOLUS (SEPSIS)
1000.0000 mL | Freq: Once | INTRAVENOUS | Status: AC
  Administered 2022-01-14: 1000 mL via INTRAVENOUS

## 2022-01-14 MED ORDER — VANCOMYCIN HCL 1750 MG/350ML IV SOLN
1750.0000 mg | Freq: Once | INTRAVENOUS | Status: AC
  Administered 2022-01-14: 1750 mg via INTRAVENOUS
  Filled 2022-01-14: qty 350

## 2022-01-14 MED ORDER — METRONIDAZOLE 500 MG/100ML IV SOLN
500.0000 mg | Freq: Once | INTRAVENOUS | Status: AC
  Administered 2022-01-14: 500 mg via INTRAVENOUS
  Filled 2022-01-14: qty 100

## 2022-01-14 MED ORDER — SODIUM CHLORIDE 0.9% FLUSH
3.0000 mL | Freq: Two times a day (BID) | INTRAVENOUS | Status: DC
  Administered 2022-01-14 – 2022-01-19 (×9): 3 mL via INTRAVENOUS

## 2022-01-14 MED ORDER — SODIUM CHLORIDE 0.9 % IV SOLN
2.0000 g | Freq: Once | INTRAVENOUS | Status: AC
  Administered 2022-01-14: 2 g via INTRAVENOUS
  Filled 2022-01-14: qty 12.5

## 2022-01-14 MED ORDER — ONDANSETRON HCL 4 MG/2ML IJ SOLN: 4.0000 mg | Freq: Four times a day (QID) | INTRAMUSCULAR | Status: DC | PRN

## 2022-01-14 MED ORDER — ACETAMINOPHEN 650 MG RE SUPP: 650.0000 mg | Freq: Four times a day (QID) | RECTAL | Status: DC | PRN

## 2022-01-14 MED ORDER — VANCOMYCIN HCL IN DEXTROSE 1-5 GM/200ML-% IV SOLN: 1000.0000 mg | Freq: Once | INTRAVENOUS | Status: DC

## 2022-01-14 MED ORDER — LACTATED RINGERS IV SOLN: INTRAVENOUS | Status: DC

## 2022-01-14 MED ORDER — ACETAMINOPHEN 325 MG PO TABS: 650.0000 mg | ORAL_TABLET | Freq: Four times a day (QID) | ORAL | Status: DC | PRN

## 2022-01-14 MED ORDER — SODIUM CHLORIDE 0.9 % IV SOLN
2.0000 g | INTRAVENOUS | Status: DC
  Administered 2022-01-15: 2 g via INTRAVENOUS
  Filled 2022-01-14: qty 20

## 2022-01-14 MED ORDER — FUROSEMIDE 10 MG/ML IJ SOLN
20.0000 mg | Freq: Once | INTRAMUSCULAR | Status: AC
  Administered 2022-01-14: 20 mg via INTRAVENOUS
  Filled 2022-01-14: qty 4

## 2022-01-14 MED ORDER — SODIUM CHLORIDE 0.9 % IV SOLN
500.0000 mg | INTRAVENOUS | Status: DC
  Administered 2022-01-14: 500 mg via INTRAVENOUS
  Filled 2022-01-14: qty 5

## 2022-01-14 NOTE — ED Provider Notes (Signed)
[facility] Provider Note    Event Date/Time   First MD Initiated Contact with Patient 01/14/22 1458     (approximate)   History   Altered Mental Status   HPI  [name] [name] is a 89 y.o. female past medical history of DVT, dementia who presents because of altered mental status.  Per nursing, EMS said that patient has had decreased mental status for the last several days.  Also with increasing lower extremity swelling.  Patient is unable to provide history due to altered mental status.  Patient was admitted 2 months ago for cellulitis had MRI that was negative for osteomyelitis.     Past Medical History:  Diagnosis Date   Cellulitis 10/28/2016   bilateral lower extremities.     Patient Active Problem List   Diagnosis Date Noted   Leg ulcer ([place]) 11/02/2021   DVT (deep venous thrombosis) ([place]) 12/12/2020   HTN (hypertension) 12/12/2020   Blister of left foot 12/12/2020   Deep vein thrombosis (DVT) of left lower extremity (H[facility]) 12/12/2020   Leukocytosis    Malnutrition of moderate degree 11/09/2020   Pressure injury of skin 11/08/2020   Dementia ([place]) 11/07/2020   Lactic acidosis 11/07/2020   Superficial bruising of arm, left, initial encounter    Chronic diastolic CHF (congestive heart failure) ([place]) 04/06/2020   HTN (hypertension), malignant    Lymphedema    Fall    Stage 3b chronic kidney disease ([place])    B12 deficiency    Anemia due to vitamin B12 deficiency    Acute metabolic encephalopathy    General weakness    Confusion    Acute UTI 01/07/2020   E. coli UTI 01/07/2020   Chronic venous stasis dermatitis of both lower extremities 01/07/2020   Cellulitis 03/06/2017   Left leg cellulitis 11/25/2016   AKI (acute kidney injury) ([place]) 11/05/2016   Decubitus ulcer of ischium, stage 2, left ([place]) 11/05/2016   Troponin I above reference range 11/04/2016     Physical Exam  Triage Vital Signs: ED Triage Vitals  Enc Vitals Group      BP 01/14/22 1418 128/72     Pulse Rate 01/14/22 1418 (!) 56     Resp 01/14/22 1418 18     Temp 01/14/22 1446 (!) 90.2 F (32.3 C)     Temp Source 01/14/22 1446 Rectal     SpO2 01/14/22 1418 98 %     Weight 01/14/22 1421 192 lb 3.9 oz (87.2 kg)     Height 01/14/22 1421 5\' 6"  (1.676 m)     Head Circumference --      Peak Flow --      Pain Score 01/14/22 1421 0     Pain Loc --      Pain Edu? --      Excl. in [place]? --     Most recent vital signs: Vitals:   01/14/22 1418 01/14/22 1446  BP: 128/72   Pulse: (!) 56   Resp: 18   Temp:  (!) 90.2 F (32.3 C)  SpO2: 98%      General: Awake, ill-appearing, pale CV:  Good peripheral perfusion.  2+ pitting edema bilateral lower extremities, cool extremities Resp:  Normal effort. No increased wob Abd:  No distention. soft Neuro:             Awake, Alert, Oriented x 1 person only  Other:  Withdrawals to pain in all extremities, resist foley placement with food strength in BL lower extremities,  follows simple commands   L lower extremity with erythema, and ulceration on lateral leg with granulation tissue   Vulva is erythematous with white discharge at the vaginal introitus    ED Results / Procedures / Treatments  Labs (all labs ordered are listed, but only abnormal results are displayed) Labs Reviewed  COMPREHENSIVE METABOLIC PANEL - Abnormal; Notable for the following components:      Result Value   Chloride 113 (*)    BUN 37 (*)    Creatinine, Ser 1.26 (*)    Albumin 3.2 (*)    AST 48 (*)    GFR, Estimated 40 (*)    All other components within normal limits  CBC WITH DIFFERENTIAL/PLATELET - Abnormal; Notable for the following components:   RBC 3.49 (*)    Hemoglobin 10.1 (*)    HCT 34.3 (*)    MCHC 29.4 (*)    Lymphs Abs 0.6 (*)    All other components within normal limits  PROTIME-INR - Abnormal; Notable for the following components:   Prothrombin Time 15.7 (*)    INR 1.3 (*)    All other components within normal  limits  APTT - Abnormal; Notable for the following components:   aPTT 44 (*)    All other components within normal limits  URINALYSIS, COMPLETE (UACMP) WITH MICROSCOPIC - Abnormal; Notable for the following components:   Color, Urine YELLOW (*)    APPearance CLOUDY (*)    Leukocytes,Ua MODERATE (*)    Bacteria, UA MANY (*)    All other components within normal limits  TSH - Abnormal; Notable for the following components:   TSH 6.609 (*)    All other components within normal limits  BRAIN NATRIURETIC PEPTIDE - Abnormal; Notable for the following components:   B Natriuretic Peptide 604.4 (*)    All other components within normal limits  TROPONIN I (HIGH SENSITIVITY) - Abnormal; Notable for the following components:   Troponin I (High Sensitivity) 19 (*)    All other components within normal limits  RESP PANEL BY RT-PCR (RSV, FLU A&B, COVID)  RVPGX2  CULTURE, BLOOD (ROUTINE X 2)  CULTURE, BLOOD (ROUTINE X 2)  URINE CULTURE  LACTIC ACID, PLASMA  LACTIC ACID, PLASMA  T4, FREE  CORTISOL  PROCALCITONIN  CBG MONITORING, ED  TROPONIN I (HIGH SENSITIVITY)     EKG  EKG reviewed and interperted by myself shows sinus brady, normal axis, left bundle branch block, no acute ischemic changes   RADIOLOGY I reviewed and interpreted the chest x-ray which shows interstitial edema   PROCEDURES:  Critical Care performed: Yes, see critical care procedure note(s)  .Critical Care  Performed by: Georga Hacking, MD Authorized by: Georga Hacking, MD   Critical care provider statement:    Critical care time (minutes):  30   Critical care was time spent personally by me on the following activities:  Development of treatment plan with patient or surrogate, discussions with consultants, evaluation of patient's response to treatment, examination of patient, ordering and review of laboratory studies, ordering and review of radiographic studies, ordering and performing treatments and  interventions, pulse oximetry, re-evaluation of patient's condition and review of old charts .1-3 Lead EKG Interpretation  Performed by: Georga Hacking, MD Authorized by: Georga Hacking, MD     Interpretation: abnormal     ECG rate assessment: bradycardic     Rhythm: sinus bradycardia     Ectopy: none     Conduction: normal  The patient is on the cardiac monitor to evaluate for evidence of arrhythmia and/or significant heart rate changes.   MEDICATIONS ORDERED IN ED: Medications  lactated ringers infusion ( Intravenous New Bag/Given 01/14/22 1613)  sodium chloride 0.9 % bolus 1,000 mL (1,000 mLs Intravenous New Bag/Given 01/14/22 1614)  metroNIDAZOLE (FLAGYL) IVPB 500 mg (500 mg Intravenous New Bag/Given 01/14/22 1618)  vancomycin (VANCOREADY) IVPB 1750 mg/350 mL (has no administration in time range)  ceFEPIme (MAXIPIME) 2 g in sodium chloride 0.9 % 100 mL IVPB (0 g Intravenous Stopped 01/14/22 1645)     IMPRESSION / MDM / ASSESSMENT AND PLAN / ED COURSE  I reviewed the triage vital signs and the nursing notes.                              Patient's presentation is most consistent with acute presentation with potential threat to life or bodily function.  Differential diagnosis includes, but is not limited to, sepsis secondary to cellulitis, UTI, pneumonia, myxedema coma, bacteremia, intracranial hemorrhage  The patient is a 89 year old female presents with altered mental status.  I was not present for EMS report but apparently the patient has had decreased level of consciousness for the last several days with increasing lower extremity swelling.  She is currently getting wound care for left lower extremity wound for which she was admitted about 2 months ago and treated for cellulitis.  On arrival patient's temp is 90.2 rectally she is bradycardic with stable blood pressure and normal pulse ox.  On exam she looks ill she is pale edematous.  She is oriented x 1 does follow some  simple commands and neurologic exam seems to be nonfocal.  The left lower extremity was dressed I remove the dressing and there is erythema left lower extremity with wound on the lateral leg, appears to have healthy granulation tissue.  Suspect some of the erythema is due to venous stasis and edema.  Patient placed on Bair hugger and temp sensing Foley inserted, temp is 88.  Will bolus of warm LR.  Patient's last EF was normal in 2022.  Sepsis alert called we will start broad-spectrum antibiotics.  Will check thyroid function as well as cortisol.  Patient will require admission.    Patient's labs overall reassuring negative lactate, normal white blood cell count.  Troponin just mildly elevated at 19.  TSH mildly elevated but normal free T4.  Has a mild AKI.  Chest x-ray looks fluid overloaded.  BNP mildly elevated.  She is not requiring oxygen currently.  I have added on a procalcitonin.  She is already received broad spectrum antibiotics.  She will require admission.   FINAL CLINICAL IMPRESSION(S) / ED DIAGNOSES   Final diagnoses:  Hypothermia, initial encounter  Altered mental status, unspecified altered mental status type     Rx / DC Orders   ED Discharge Orders     None        Note:  This document was prepared using Dragon voice recognition software and may include unintentional dictation errors.   Rada Hay, MD 01/14/22 [phone]

## 2022-01-14 NOTE — Progress Notes (Signed)
[facility] is following code sepsis 

## 2022-01-14 NOTE — Assessment & Plan Note (Addendum)
On presentation, patient was 54 Fahrenheit with altered level of consciousness, however altered mental status has been present for 2 days.  Uncertain etiology at this time, but differential includes underlying infection as noted above.  Source could potentially be pneumonia given patchy opacities, however these can also be explained by heart failure.  - Scientific laboratory technician - Continue IV fluids - Transition antibiotics to ceftriaxone and azithromycin - MRSA PCR pending - If positive, will plan to restart vancomycin at that time - Urine and blood cultures pending - Procalcitonin pending - RVP pending - Strep pneumo and Legionella urinary antigens pending - Cortisol level pending

## 2022-01-14 NOTE — Assessment & Plan Note (Addendum)
Patient is presenting with a history of altered mental status with hypothermia.  Etiology at this point is uncertain.  TSH mildly elevated with normal T4.  Differential includes underlying infection including pneumonia given patchy opacities on chest x-ray however her BNP is elevated, so opacities may be in the setting of pulmonary edema.  - Monitor closely for respiratory distress - Continue supplemental oxygen to maintain oxygen saturation above 80% - Telemetry monitoring while rewarming - Infectious workup as noted below

## 2022-01-14 NOTE — Assessment & Plan Note (Signed)
Patient has a history of a left lateral leg ulcer for which she was treated with antibiotics in October 2023.  At this time, the ulcer appears larger in size, however is dry with a clean base.  There is surrounding erythema, however this is more consistent with chronic venous stasis.  - Wound care consult placed

## 2022-01-14 NOTE — ED Notes (Signed)
Rainbow, (Lav, Lt Gr, Grey, blue) plus [facility] # 1 drawn and sent to lab

## 2022-01-14 NOTE — Progress Notes (Signed)
CODE SEPSIS - PHARMACY COMMUNICATION  **Broad Spectrum Antibiotics should be administered within 1 hour of Sepsis diagnosis**  Time Code Sepsis Called/Page Received: 1511  Antibiotics Ordered: cefipime, vancomycin  Time of 1st antibiotic administration: 1614  Additional action taken by pharmacy: N/A      Alison Murray ,PharmD Clinical Pharmacist  01/14/2022  4:34 PM

## 2022-01-14 NOTE — H&P (Signed)
History and Physical    Patient: [name] [name] ZOX:[id number] DOB: October 01, 1926 DOA: 01/14/2022 DOS: the patient was seen and examined on 01/14/2022 PCP: Leonel Ramsay, MD  Patient coming from: Home  Chief Complaint:  Chief Complaint  Patient presents with   Altered Mental Status   HPI: [name] [name] is a 89 y.o. female with medical history significant of dementia, bilateral lymphedema, previous DVT not on Eliquis, hypertension, who presents to the ED due to AMS. History obtained through chart review due to patient's altered mental status and no family at bedside.  Per chart review, patient presented via EMS with 2 days of worsening confusion and increasing lower extremity swelling.  Patient's pain told EMS that patient is usually interactive and aware of her surroundings.  Per chart review, patient was admitted 2 months ago for cellulitis with MRI negative for osteomyelitis.  ED course: On arrival to the ED, patient was hypothermic at 90.2 with heart rate of 52 and blood pressure 107/52.  She was saturating in the high 80s on room air and improved to 95% on 2 L.  Initial workup remarkable for chloride of 113, BUN of 37, creatinine 1.26, AST 48, hemoglobin of 10.3.  BNP elevated at 604.  Troponin elevated at 19 with flat trend to 18.  Lactic acid within normal limits.  CT head was obtained with no acute intracranial abnormalities chest x-ray was obtained that showed diffuse interstitial prominence with patchy airspace disease, multifocal pneumonia versus pulmonary edema.  X-ray of the tib-fib was obtained that showed large area of prove aggressive soft tissue ulceration.  TRH contacted for admission for acute encephalopathy of unknown etiology and acute hypoxic respiratory failure.  Review of Systems: As mentioned in the history of present illness. All other systems reviewed and are negative. Past Medical History:  Diagnosis Date   Cellulitis 10/28/2016   bilateral lower extremities.     Past Surgical History:  Procedure Laterality Date   ABDOMINAL HYSTERECTOMY     GANGLION CYST EXCISION     LOWER EXTREMITY VENOGRAPHY Left 12/14/2020   Procedure: LOWER EXTREMITY VENOGRAPHY;  Surgeon: Algernon Huxley, MD;  Location: [facility];  Service: Cardiovascular;  Laterality: Left;   Social History:  reports that she has never smoked. She has never used smokeless tobacco. She reports that she does not drink alcohol and does not use drugs.  No Known Allergies  Family History  Problem Relation Age of Onset   Diabetes Mellitus II Mother    Heart disease Father     Prior to Admission medications   Medication Sig Start Date End Date Taking? Authorizing Provider  acetaminophen (TYLENOL) 500 MG tablet Take 1 tablet (500 mg total) by mouth every 6 (six) hours as needed for mild pain or headache (fever >/= 101). 10/19/20   Cherene Altes, MD  apixaban (ELIQUIS) 5 MG TABS tablet Take 2 tablets (10 mg total) by mouth 2 (two) times daily for 5 days, THEN 1 tablet (5 mg total) 2 (two) times daily. 12/16/20 01/20/21  Richarda Osmond, MD  feeding supplement (ENSURE ENLIVE / ENSURE PLUS) LIQD Take 237 mLs by mouth 3 (three) times daily between meals. 11/10/20   Max Sane, MD  polyethylene glycol (MIRALAX / GLYCOLAX) 17 g packet Take 17 g by mouth 2 (two) times daily. 10/19/20   Cherene Altes, MD    Physical Exam: Vitals:   01/14/22 1446 01/14/22 1500 01/14/22 1600 01/14/22 1927  BP:  (!) 107/52 (!) 112/51  Pulse:  (!) 52 (!) 56   Resp:  15 15   Temp: (!) 90.2 F (32.3 C)  (!) 90.7 F (32.6 C) (!) 93.8 F (34.3 C)  TempSrc: Rectal   Core  SpO2:  95% 99%   Weight:      Height:       Physical Exam Vitals and nursing note reviewed.  Constitutional:      Comments: Patient is lethargic, but will open her eyes to verbal stimuli.  She will respond to her name, but quickly closes her eyes and falls back asleep  HENT:     Head: Normocephalic and atraumatic.  Eyes:      Conjunctiva/sclera: Conjunctivae normal.     Pupils: Pupils are equal, round, and reactive to light.  Cardiovascular:     Rate and Rhythm: Regular rhythm. Bradycardia present.     Heart sounds: No murmur heard.    No gallop.  Pulmonary:     Effort: Pulmonary effort is normal.     Breath sounds: Rales (Bibasilar) present. No wheezing or rhonchi.  Abdominal:     General: Bowel sounds are normal. There is no distension.     Palpations: Abdomen is soft.     Tenderness: There is no abdominal tenderness. There is no guarding.  Musculoskeletal:     Right lower leg: 2+ Pitting Edema present.     Left lower leg: 2+ Pitting Edema present.  Skin:    General: Skin is warm and dry.     Comments: Erythema surrounding the periumbilical region, mild in nature. Left lower extremity with a large ulcerated wound with no purulent drainage.  Chronic venous stasis of bilateral lower extremities noted. Left foot with a draining blister on the lateral aspect with no fluctuance noted.  No purulent drainage.  Neurological:     Mental Status: She is lethargic.     Comments:  Patient is lethargic but will open eyes when asked.  Otherwise, not able to follow commands consistently.   Moves all extremities independently         Data Reviewed: CBC with WBC of 4.9, hemoglobin of 10.1, platelets of 230 CMP with potassium of 5.1, chloride of 113, bicarb of 23, glucose of 84, BUN of 37, creatinine 1.26, anion gap of 7, albumin of 3.2, AST 48 and GFR 40. BNP elevated at 604 Troponin elevated 19 with flat trend to 18 Lactic acid 1.5 TSH elevated at 6.6 with normal free T4 at 1.07 INR elevated at 1.3.  PTT elevated at 44 Influenza, RSV and COVID-19 PCR negative Urinalysis with moderate leukocytes, many bacteria, hyaline casts and mucus, however many squamous epithelial cells noted.  EKG personally reviewed.  Sinus rhythm with a rate of 51 consistent with sinus bradycardia.  DG Tibia/Fibula Left  Result  Date: 01/14/2022 CLINICAL DATA:  Questionable sepsis.  Evaluate for abnormality. EXAM: LEFT TIBIA AND FIBULA - 2 VIEW COMPARISON:  MR 11/03/2021 FINDINGS: There is diffuse soft tissue edema. Large area of progressive soft tissue ulceration is identified overlying the anterolateral left lower extremity measuring approximately 5.9 x 2.4 cm. This is increased from 4.1 x 1.0 cm previously. No underlying bone erosion, fracture or dislocation. Severe degenerative changes are noted within the left knee. Moderate left ankle degenerative change. IMPRESSION: 1. Large area of progressive soft tissue ulceration overlying the anterolateral left lower extremity. 2. Diffuse soft tissue edema which may reflect cellulitis 3. No evidence for osteomyelitis. 4. Severe osteoarthritis of the left knee. Electronically Signed   By:  Kerby Moors M.D.   On: 01/14/2022 16:20   DG Chest Port 1 View  Result Date: 01/14/2022 CLINICAL DATA:  Questionable sepsis - evaluate for abnormality EXAM: PORTABLE CHEST 1 VIEW COMPARISON:  Radiograph 11/07/2020 FINDINGS: Unchanged cardiomediastinal silhouette. There is diffuse interstitial prominence with patchy airspace disease bilaterally. No pleural effusion or evidence of pneumothorax. Bilateral shoulder degenerative changes. Thoracic spondylosis. No acute osseous abnormality. IMPRESSION: Diffuse interstitial prominence with patchy airspace disease bilaterally, could reflect a multifocal pneumonia and/or pulmonary edema. Electronically Signed   By: Maurine Simmering M.D.   On: 01/14/2022 16:09   CT HEAD WO CONTRAST (5MM)  Result Date: 01/14/2022 CLINICAL DATA:  Confusion for 2 days, lower extremity edema, altered level of consciousness EXAM: CT HEAD WITHOUT CONTRAST TECHNIQUE: Contiguous axial images were obtained from the base of the skull through the vertex without intravenous contrast. RADIATION DOSE REDUCTION: This exam was performed according to the departmental dose-optimization program which  includes automated exposure control, adjustment of the mA and/or kV according to patient size and/or use of iterative reconstruction technique. COMPARISON:  11/07/2020 FINDINGS: Brain: No acute infarct or hemorrhage. Lateral ventricles and midline structures are stable. No acute extra-axial fluid collections. No mass effect. Vascular: No hyperdense vessel or unexpected calcification. Skull: Normal. Negative for fracture or focal lesion. Sinuses/Orbits: No acute finding. Other: None. IMPRESSION: 1. No acute intracranial process. Electronically Signed   By: Randa Ngo M.D.   On: 01/14/2022 15:44    Results are pending, will review when available.  Assessment and Plan:  Acute encephalopathy Patient is presenting with a history of altered mental status with hypothermia.  Etiology at this point is uncertain.  TSH mildly elevated with normal T4.  Differential includes underlying infection including pneumonia given patchy opacities on chest x-ray however her BNP is elevated, so opacities may be in the setting of pulmonary edema.  - Monitor closely for respiratory distress - Continue supplemental oxygen to maintain oxygen saturation above 80% - Telemetry monitoring while rewarming - Infectious workup as noted below  Hypothermia On presentation, patient was 31 Fahrenheit with altered level of consciousness, however altered mental status has been present for 2 days.  Uncertain etiology at this time, but differential includes underlying infection as noted above.  Source could potentially be pneumonia given patchy opacities, however these can also be explained by heart failure.  - Scientific laboratory technician - Continue IV fluids - Transition antibiotics to ceftriaxone and azithromycin - MRSA PCR pending - If positive, will plan to restart vancomycin at that time - Urine and blood cultures pending - Procalcitonin pending - RVP pending - Strep pneumo and Legionella urinary antigens pending - Cortisol level  pending  Acute hypoxic respiratory failure ([place]) Patient presenting with oxygen saturation in the high 80s on room air with improvement to 95% on 2 L.  Chest x-ray with patchy airspace opacities concerning for multifocal pneumonia versus heart failure.  Given hypothermia, pneumonia is certainly likely, however BNP is elevated and patient has significant lower extremity edema.  Unfortunately, due to vasodilation with rewarming, diuresis may lead to hypotension.  Will start with a low-dose of Lasix and proceed cautiously  - Continue supplemental oxygen to maintain oxygen saturation above 88% - Wean as tolerated - MRSA PCR, procalcitonin and RVP pending - Legionella and pneumo urinary antigens pending - Transition to ceftriaxone from cefepime - Add azithromycin - Will restart vancomycin if MRSA PCR is positive - Lasix 20 mg IV one-time - Strict in and out - Daily weights  AKI (acute kidney injury) (HCC) Uncertain etiology at this time.  Patient has had poor p.o. intake over the last 2 days, however she appears hypervolemic on exam.  - Lasix as noted above - Monitor urine output - Repeat BMP in the morning - Avoid nephrotoxic agents as able - Continue Foley catheter placed in the ED for now  Leg ulcer [facility]) Patient has a history of a left lateral leg ulcer for which she was treated with antibiotics in October 2023.  At this time, the ulcer appears larger in size, however is dry with a clean base.  There is surrounding erythema, however this is more consistent with chronic venous stasis.  - Wound care consult placed  DVT (deep venous thrombosis) (HCC) Patient has a history of DVT approximately 1 year ago.  She remains on Eliquis.  Will resume when able.  - Will resume Eliquis and tolerating p.o. intake  Advance Care Planning:   Code Status: DNR/DNI per Gold form signed and present in patient's room.  Patient unable to make medical decisions at this time.  No family at  bedside.  Consults: None  Family Communication: Attempted to call patient's son via telephone.  Severity of Illness: The appropriate patient status for this patient is INPATIENT. Inpatient status is judged to be reasonable and necessary in order to provide the required intensity of service to ensure the patient's safety. The patient's presenting symptoms, physical exam findings, and initial radiographic and laboratory data in the context of their chronic comorbidities is felt to place them at high risk for further clinical deterioration. Furthermore, it is not anticipated that the patient will be medically stable for discharge from the hospital within 2 midnights of admission.   * I certify that at the point of admission it is my clinical judgment that the patient will require inpatient hospital care spanning beyond 2 midnights from the point of admission due to high intensity of service, high risk for further deterioration and high frequency of surveillance required.*  Author: Verdene Lennert, MD 01/14/2022 8:17 PM  For on call review [email or web address].

## 2022-01-14 NOTE — Assessment & Plan Note (Addendum)
Patient presenting with oxygen saturation in the high 80s on room air with improvement to 95% on 2 L.  Chest x-ray with patchy airspace opacities concerning for multifocal pneumonia versus heart failure.  Given hypothermia, pneumonia is certainly likely, however BNP is elevated and patient has significant lower extremity edema.  Unfortunately, due to vasodilation with rewarming, diuresis may lead to hypotension.  Will start with a low-dose of Lasix and proceed cautiously  - Continue supplemental oxygen to maintain oxygen saturation above 88% - Wean as tolerated - MRSA PCR, procalcitonin and RVP pending - Legionella and pneumo urinary antigens pending - Transition to ceftriaxone from cefepime - Add azithromycin - Will restart vancomycin if MRSA PCR is positive - Lasix 20 mg IV one-time - Strict in and out - Daily weights

## 2022-01-14 NOTE — ED Triage Notes (Signed)
Pt from home, BIBA for increasing confusion X 2 days and increasing lower extremity swelling. Pt usually interactive, now not.

## 2022-01-14 NOTE — Assessment & Plan Note (Signed)
Uncertain etiology at this time.  Patient has had poor p.o. intake over the last 2 days, however she appears hypervolemic on exam.  - Lasix as noted above - Monitor urine output - Repeat BMP in the morning - Avoid nephrotoxic agents as able - Continue Foley catheter placed in the ED for now

## 2022-01-14 NOTE — Assessment & Plan Note (Signed)
Patient has a history of DVT approximately 1 year ago.  She remains on Eliquis.  Will resume when able.  - Will resume Eliquis and tolerating p.o. intake

## 2022-01-14 NOTE — Consult Note (Signed)
PHARMACY -  BRIEF ANTIBIOTIC NOTE   Pharmacy has received consult(s) for vancomycin and cefepime dosing from an ED provider.  The patient's profile has been reviewed for ht/wt/allergies/indication/available labs.    One time order(s) placed for Vancomycin 1750 mg IV and Cefepime 2 grams IV  Further antibiotics/pharmacy consults should be ordered by admitting physician if indicated.                       Thank you, Lorin Picket 01/14/2022  3:27 PM

## 2022-01-15 ENCOUNTER — Encounter: Payer: Self-pay | Admitting: Internal Medicine

## 2022-01-15 ENCOUNTER — Inpatient Hospital Stay: Payer: Medicare HMO

## 2022-01-15 DIAGNOSIS — G9340 Encephalopathy, unspecified: Secondary | ICD-10-CM | POA: Diagnosis not present

## 2022-01-15 LAB — RESPIRATORY PANEL BY PCR

## 2022-01-15 LAB — CBC WITH DIFFERENTIAL/PLATELET
Abs Immature Granulocytes: 0 10*3/uL (ref 0.00–0.07)
Basophils Absolute: 0 10*3/uL (ref 0.0–0.1)
Basophils Relative: 1 %
Eosinophils Absolute: 0 10*3/uL (ref 0.0–0.5)
Eosinophils Relative: 1 %
HCT: 27.2 % — ABNORMAL LOW (ref 36.0–46.0)
Hemoglobin: 8.2 g/dL — ABNORMAL LOW (ref 12.0–15.0)
Immature Granulocytes: 0 %
Lymphocytes Relative: 12 %
Lymphs Abs: 0.6 10*3/uL — ABNORMAL LOW (ref 0.7–4.0)
MCH: 29.6 pg (ref 26.0–34.0)
MCHC: 30.1 g/dL (ref 30.0–36.0)
MCV: 98.2 fL (ref 80.0–100.0)
Monocytes Absolute: 0.4 10*3/uL (ref 0.1–1.0)
Monocytes Relative: 8 %
Neutro Abs: 3.8 10*3/uL (ref 1.7–7.7)
Neutrophils Relative %: 78 %
Platelets: 192 10*3/uL (ref 150–400)
RBC: 2.77 MIL/uL — ABNORMAL LOW (ref 3.87–5.11)
RDW: 14.3 % (ref 11.5–15.5)
WBC: 4.8 10*3/uL (ref 4.0–10.5)
nRBC: 0 % (ref 0.0–0.2)

## 2022-01-15 LAB — CBG MONITORING, ED: Glucose-Capillary: 79 mg/dL (ref 70–99)

## 2022-01-15 LAB — PROCALCITONIN: Procalcitonin: <0.1 ng/mL

## 2022-01-15 LAB — COMPREHENSIVE METABOLIC PANEL
ALT: 60 U/L — ABNORMAL HIGH (ref 0–44)
AST: 82 U/L — ABNORMAL HIGH (ref 15–41)
Albumin: 2.6 g/dL — ABNORMAL LOW (ref 3.5–5.0)
Alkaline Phosphatase: 91 U/L (ref 38–126)
Anion gap: 7 (ref 5–15)
BUN: 32 mg/dL — ABNORMAL HIGH (ref 8–23)
CO2: 20 mmol/L — ABNORMAL LOW (ref 22–32)
Calcium: 8.2 mg/dL — ABNORMAL LOW (ref 8.9–10.3)
Chloride: 119 mmol/L — ABNORMAL HIGH (ref 98–111)
Creatinine, Ser: 1.27 mg/dL — ABNORMAL HIGH (ref 0.44–1.00)
GFR, Estimated: 39 mL/min — ABNORMAL LOW (ref >60)
Glucose, Bld: 75 mg/dL (ref 70–99)
Potassium: 5.2 mmol/L — ABNORMAL HIGH (ref 3.5–5.1)
Sodium: 146 mmol/L — ABNORMAL HIGH (ref 135–145)
Total Bilirubin: 0.8 mg/dL (ref 0.3–1.2)
Total Protein: 5.5 g/dL — ABNORMAL LOW (ref 6.5–8.1)

## 2022-01-15 LAB — IRON AND TIBC
Iron: 17 ug/dL — ABNORMAL LOW (ref 28–170)
Saturation Ratios: 9 % — ABNORMAL LOW (ref 10.4–31.8)
TIBC: 190 ug/dL — ABNORMAL LOW (ref 250–450)
UIBC: 173 ug/dL

## 2022-01-15 LAB — C-REACTIVE PROTEIN: CRP: 7.6 mg/dL — ABNORMAL HIGH

## 2022-01-15 LAB — FERRITIN: Ferritin: 124 ng/mL (ref 11–307)

## 2022-01-15 LAB — VITAMIN B12: Vitamin B-12: 622 pg/mL (ref 180–914)

## 2022-01-15 LAB — SEDIMENTATION RATE: Sed Rate: 48 mm/hr — ABNORMAL HIGH (ref 0–30)

## 2022-01-15 LAB — FOLATE: Folate: 9.8 ng/mL (ref >5.9)

## 2022-01-15 LAB — STREP PNEUMONIAE URINARY ANTIGEN: Strep Pneumo Urinary Antigen: NEGATIVE

## 2022-01-15 MED ORDER — IOHEXOL 350 MG/ML SOLN
60.0000 mL | Freq: Once | INTRAVENOUS | Status: AC | PRN
  Administered 2022-01-15: 60 mL via INTRAVENOUS

## 2022-01-15 MED ORDER — SODIUM CHLORIDE 0.9 % IV SOLN
2.0000 g | Freq: Two times a day (BID) | INTRAVENOUS | Status: DC
  Administered 2022-01-15 – 2022-01-17 (×5): 2 g via INTRAVENOUS
  Filled 2022-01-15: qty 2
  Filled 2022-01-15 (×3): qty 12.5
  Filled 2022-01-15 (×2): qty 2

## 2022-01-15 MED ORDER — DEXTROSE-NACL 5-0.45 % IV SOLN: INTRAVENOUS | Status: DC

## 2022-01-15 MED ORDER — THIAMINE HCL 100 MG/ML IJ SOLN
500.0000 mg | Freq: Three times a day (TID) | INTRAVENOUS | Status: AC
  Administered 2022-01-15 – 2022-01-17 (×9): 500 mg via INTRAVENOUS
  Filled 2022-01-15 (×9): qty 5

## 2022-01-15 MED ORDER — ENOXAPARIN SODIUM 150 MG/ML IJ SOSY
1.5000 mg/kg | PREFILLED_SYRINGE | INTRAMUSCULAR | Status: DC
  Administered 2022-01-15 – 2022-01-18 (×4): 132 mg via SUBCUTANEOUS
  Filled 2022-01-15 (×4): qty 0.88
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
## 2022-01-15 NOTE — ED Notes (Signed)
Patient to CT.

## 2022-01-15 NOTE — Progress Notes (Signed)
Pharmacy Antibiotic Note  [name] [name] is a 89 y.o. female w/ PMH of dementia, bilateral lymphedema, previous DVT not on Eliquis, hypertension admitted on 01/14/2022 with cellulitis, possible CAP .  Pharmacy has been consulted for cellulitis, possible cefepime dosing.  Plan: start cefepime 2 grams IV every 12 hours ---follow renal function for needed dose adjustments   Height: 5\' 6"  (167.6 cm) Weight: 87.2 kg (192 lb 3.9 oz) IBW/kg (Calculated) : 59.3  Temp (24hrs), Avg:96.3 F (35.7 C), Min:90.2 F (32.3 C), Max:99.1 F (37.3 C)  Recent Labs  Lab 01/14/22 1434 01/14/22 1621 01/15/22 0500  WBC 4.9  --  4.8  CREATININE 1.26*  --  1.27*  LATICACIDVEN 1.5 1.4  --     Estimated Creatinine Clearance: 30.8 mL/min (A) (by C-G formula based on SCr of 1.27 mg/dL (H)).    No Known Allergies  Antimicrobials this admission: 01/06 ceftriaxone, azithromycin, vancomycin, metronidazole x 1  01/06 cefepime >>   Microbiology results: 01/05 BCx: NGTD 01/05 Resp panel: negative 01/05 UCx: pending  01/05 MRSA PCR: pending  Thank you for allowing pharmacy to be a part of this patient's care.  Dallie Piles 01/15/2022 9:58 AM

## 2022-01-15 NOTE — ED Notes (Signed)
Pt to MRI

## 2022-01-15 NOTE — Progress Notes (Signed)
Unable to complete admission profile, pt confused,

## 2022-01-15 NOTE — ED Notes (Signed)
Pt soiled with urine. Pt cleaned by this RN and bed linens changed. Purewick placed.   Temp 95.4 rectal. Placed on bear hugger.

## 2022-01-16 ENCOUNTER — Inpatient Hospital Stay: Payer: Medicare HMO

## 2022-01-16 DIAGNOSIS — G9340 Encephalopathy, unspecified: Secondary | ICD-10-CM | POA: Diagnosis not present

## 2022-01-16 LAB — COMPREHENSIVE METABOLIC PANEL
ALT: 57 U/L — ABNORMAL HIGH (ref 0–44)
AST: 73 U/L — ABNORMAL HIGH (ref 15–41)
Albumin: 2.7 g/dL — ABNORMAL LOW (ref 3.5–5.0)
Alkaline Phosphatase: 88 U/L (ref 38–126)
Anion gap: 8 (ref 5–15)
BUN: 32 mg/dL — ABNORMAL HIGH (ref 8–23)
CO2: 20 mmol/L — ABNORMAL LOW (ref 22–32)
Calcium: 8.4 mg/dL — ABNORMAL LOW (ref 8.9–10.3)
Chloride: 117 mmol/L — ABNORMAL HIGH (ref 98–111)
Creatinine, Ser: 1.28 mg/dL — ABNORMAL HIGH (ref 0.44–1.00)
GFR, Estimated: 39 mL/min — ABNORMAL LOW (ref >60)
Glucose, Bld: 100 mg/dL — ABNORMAL HIGH (ref 70–99)
Potassium: 4.5 mmol/L (ref 3.5–5.1)
Sodium: 145 mmol/L (ref 135–145)
Total Bilirubin: 0.9 mg/dL (ref 0.3–1.2)
Total Protein: 6 g/dL — ABNORMAL LOW (ref 6.5–8.1)

## 2022-01-16 LAB — CBC
HCT: 28.6 % — ABNORMAL LOW (ref 36.0–46.0)
Hemoglobin: 8.6 g/dL — ABNORMAL LOW (ref 12.0–15.0)
MCH: 28.9 pg (ref 26.0–34.0)
MCHC: 30.1 g/dL (ref 30.0–36.0)
MCV: 96 fL (ref 80.0–100.0)
Platelets: 205 10*3/uL (ref 150–400)
RBC: 2.98 MIL/uL — ABNORMAL LOW (ref 3.87–5.11)
RDW: 14.6 % (ref 11.5–15.5)
WBC: 4.4 10*3/uL (ref 4.0–10.5)
nRBC: 0.7 % — ABNORMAL HIGH (ref 0.0–0.2)

## 2022-01-16 LAB — HEPATITIS PANEL, ACUTE
HCV Ab: REACTIVE — AB
Hep A IgM: NONREACTIVE
Hep B C IgM: NONREACTIVE
Hepatitis B Surface Ag: NONREACTIVE

## 2022-01-16 LAB — SARS CORONAVIRUS 2 BY RT PCR: SARS Coronavirus 2 by RT PCR: NEGATIVE

## 2022-01-16 MED ORDER — LORAZEPAM 2 MG/ML IJ SOLN
1.0000 mg | Freq: Once | INTRAMUSCULAR | Status: AC | PRN
  Administered 2022-01-16: 1 mg via INTRAVENOUS
  Filled 2022-01-16: qty 1

## 2022-01-16 NOTE — Consult Note (Signed)
[facility] Nurse Consult Note: Reason for Consult:Patient with chronic, nonhealing wound of LLE. Patient has been seen for approximately 3 years at the outpatient wound care center by Dr Heber [place] however there was an extended period where the patient did not present for follow up during that time. She was last seen on 12/22/21. Wound type: full thickness Pressure Injury POA: N/A Measurement:Per 12/22/21 appointment with Dr. Heber [place], 8cm x 4cm x 0cm Wound bed:See photo uploaded to EMR on admission: ruddy red wound bed, dry Drainage (amount, consistency, odor) none Periwound: edema, greater on left than right LE Dressing procedure/placement/frequency: I will implement a conservative POC while in house using a daily NS cleanse followed by covering the lesion with folded layers of xeroform gauze (antimicrobial, nonadherent). The topper dressing is to be an ABD pad and the dressing secured by wrapping with Kerlix roll gauze from just below toes to just below knee. The Kerlix is to be topped with an ACE bandage applied in a similar manner and the foot placed into a Prevalon pressure redistribution heel boot.  The patient is to follow up with Dr.l Heber  at the outpatient wound care center as directed upon discharge.   House pressure injury prevention measures are in place and include turning and repositioning, use of Prevalon boots, placement of a silicone foam to the sacrum and provision of a pressure redistribution chair pad for use when OOB in the chair. This is to be sent home with the patient at time of discharge.  [place] nursing team will not follow, but will remain available to this patient, the nursing and medical teams.  Please re-consult if needed.  Thank you for inviting [place] to participate in this patient's Plan of Care.  Maudie Flakes, MSN, RN, CNS, [place], Serita Grammes, [employer], [employer] phone:  [phone]

## 2022-01-16 NOTE — Plan of Care (Signed)
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
## 2022-01-17 DIAGNOSIS — Z515 Encounter for palliative care: Secondary | ICD-10-CM

## 2022-01-17 DIAGNOSIS — G9340 Encephalopathy, unspecified: Secondary | ICD-10-CM | POA: Diagnosis not present

## 2022-01-17 DIAGNOSIS — F02C0 Dementia in other diseases classified elsewhere, severe, without behavioral disturbance, psychotic disturbance, mood disturbance, and anxiety: Secondary | ICD-10-CM

## 2022-01-17 DIAGNOSIS — F0390 Unspecified dementia without behavioral disturbance: Secondary | ICD-10-CM

## 2022-01-17 DIAGNOSIS — Z7189 Other specified counseling: Secondary | ICD-10-CM

## 2022-01-17 DIAGNOSIS — R5381 Other malaise: Secondary | ICD-10-CM

## 2022-01-17 LAB — COMPREHENSIVE METABOLIC PANEL
ALT: 43 U/L (ref 0–44)
AST: 42 U/L — ABNORMAL HIGH (ref 15–41)
Albumin: 2.4 g/dL — ABNORMAL LOW (ref 3.5–5.0)
Alkaline Phosphatase: 74 U/L (ref 38–126)
Anion gap: 7 (ref 5–15)
BUN: 25 mg/dL — ABNORMAL HIGH (ref 8–23)
CO2: 20 mmol/L — ABNORMAL LOW (ref 22–32)
Calcium: 8.1 mg/dL — ABNORMAL LOW (ref 8.9–10.3)
Chloride: 116 mmol/L — ABNORMAL HIGH (ref 98–111)
Creatinine, Ser: 1.23 mg/dL — ABNORMAL HIGH (ref 0.44–1.00)
GFR, Estimated: 41 mL/min — ABNORMAL LOW (ref >60)
Glucose, Bld: 102 mg/dL — ABNORMAL HIGH (ref 70–99)
Potassium: 3.9 mmol/L (ref 3.5–5.1)
Sodium: 143 mmol/L (ref 135–145)
Total Bilirubin: 0.6 mg/dL (ref 0.3–1.2)
Total Protein: 5.5 g/dL — ABNORMAL LOW (ref 6.5–8.1)

## 2022-01-17 LAB — CBC
HCT: 27.3 % — ABNORMAL LOW (ref 36.0–46.0)
Hemoglobin: 8 g/dL — ABNORMAL LOW (ref 12.0–15.0)
MCH: 28.9 pg (ref 26.0–34.0)
MCHC: 29.3 g/dL — ABNORMAL LOW (ref 30.0–36.0)
MCV: 98.6 fL (ref 80.0–100.0)
Platelets: 168 10*3/uL (ref 150–400)
RBC: 2.77 MIL/uL — ABNORMAL LOW (ref 3.87–5.11)
RDW: 14.7 % (ref 11.5–15.5)
WBC: 3.9 10*3/uL — ABNORMAL LOW (ref 4.0–10.5)
nRBC: 0 % (ref 0.0–0.2)

## 2022-01-17 MED ORDER — CEFAZOLIN SODIUM-DEXTROSE 1-4 GM/50ML-% IV SOLN
1.0000 g | Freq: Three times a day (TID) | INTRAVENOUS | Status: DC
  Administered 2022-01-17 – 2022-01-18 (×2): 1 g via INTRAVENOUS
  Filled 2022-01-17 (×4): qty 50

## 2022-01-17 NOTE — Consult Note (Signed)
WOC Nurse Consult Note: Reason for Consult:     Reconsulted to evaluate left posterior hip Stage 3 pressure injury and serum filled heel blisters, right lateral great toe deflated blister and right lateral foot at 5th digit blister. Wound type:venous insufficiency, pressure (left hip) Pressure Injury POA: Yes Measurement: Left hip:  15cm x 13cm red, moist tissue with small area of yellow fibrinous material in center, thin serous exudate Right lateral great toe:  3cm x 1.5cm dark tissue at site of deflated blister, dry Left lateral foot at 5th digit: serum filled blister measuring 4cm x 6cm, draining serum Left heel:  6cm x 4cm serum filled blister Right heel:  4cm round serum filled blister Wound bed:As noted above Drainage (amount, consistency, odor) As noted above Periwound: intact, edematous Dressing procedure/placement/frequency: I will add a mattress replacement to the POC and bilateral Prevalon boots as patient is not independently initiating movement in the bed. . She is incontinent of urine and has an external female urinary incontinence diverting device (PurWick) in place. Wound care to the left posterior hip will be with daily cleanse followed by placement of xeroform gauze topped with dry gauze and secured with silicone foam. In addition to the care ordered yesterday to the LLE, topical care to the bilateral heels and right great toe and left foot at 5th digit will be outlined for Nursing. Wound care to wounds noted above will be with daily soap and water cleanse followed by application of betadine swabstick. After drying, the wounds are to be dressed with dry gauze and secured with Kerlix roll gauze/paper tape prior to placing feet into Prevalon boots.  The assistance of the Bedside RN L. Abner Greenspan was essential for the examination of the patient's skin. Patent's family member in room at time of my assessment.  [place] nursing team will not follow, but will remain available to this patient,  the nursing and medical teams.  Please re-consult if needed.  Thank you for inviting [place] to participate in this patient's Plan of Care.  Maudie Flakes, MSN, RN, CNS, [place], Serita Grammes, [employer], [employer] phone:  [phone]
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
## 2022-01-17 NOTE — Care Management Important Message (Signed)
Important Message  Patient Details  Name: [name] [name] MRN: [id number] Date of Birth: [birth year]   Medicare Important Message Given:  Yes     Dannette Barbara 01/17/2022, 11:52 AM

## 2022-01-17 NOTE — Consult Note (Signed)
Consultation Note Date: 01/17/2022   Patient Name: [name] [name]  DOB: February 20, 1926  MRN: [id number]  Age / Sex: 89 y.o., female  PCP: Mick Sell, MD Referring Physician: Kathrynn Running, MD  Reason for Consultation: Establishing goals of care  HPI/Patient Profile: 89 y.o. female  with past medical history of dementia, bilateral lymphedema, diastolic heart failure, DVT, hypertension admitted on 01/14/2022 with altered mental status with worsening confusion and lower extremity swelling also found to be hypothermic with concern for pneumonia vs wound infection with AKI. Noted PCP records with signs of decline and becoming difficult for her to physically get to her appointments. Overall failure to thrive in the setting of dementia, heart failure, and advanced age.   Clinical Assessment and Goals of Care: Extensive chart review completed. Discussed with RN. Met with [name] but no family/visitors to bedside. Ms. Markovic initially briefly opened her eyes and mumbled and then fell back asleep. A few minutes later she awoke more and was talking but difficult to understand and she became tearful. She does clearly tell me "I have to go." I clarified with her and asked "do you feel like God is calling you home?" and she nodded her head yes. I reassured her that she was safe and in good hands and she calmed.   I was unable to reach son, [name] [name], and unable to leave voicemail. I I was able to speak with grandson, [name] [name]. [name] [name] shares that he lives and cares for his grandmother. He reports that he has help from friends that try to help care for his grandmother. He has home health that comes out and trying to help with wounds. We did discuss her current status and concern that she is not improving as much as we hope. I explained my interaction with [name]. I asked if they have had conversations regarding her wishes if  she were to worsen or approach end of life. [name] [name] reports that she has lived a good and long life. He reports that she has always told him that she wants to die at home. We discussed that she may be nearing the point of considering going home with the help of hospice care. [name] [name] reports that he would be open to hospice care at home. We agree to see how she does over the next 24 hours and talk again tomorrow to consider home with hospice.   I received a return call from son, [name] [name]. [name] [name] also indicates openness to hospice and comfort for Ms. Jernigan if she worsens or does not improve. Although [name] [name] indicates that he would be interested in hospice facility if she qualifies. I encouraged [name] [name] to speak with [name] [name] and that we will speak again tomorrow and discuss best path forward based on her progress.   All questions/concerns addressed. Emotional support provided. Discussed with Dr. Ashok Pall.   Primary Decision Maker NEXT OF KIN [name]; grandson [name] [name] also included in conversations    SUMMARY OF RECOMMENDATIONS   - DNR - Watchful waiting - Consideration of hospice  support at time of discharge - May pursue hospice sooner if no improvement   Code Status/Advance Care Planning: DNR   Symptom Management:  Per attending.   Prognosis:  Overall prognosis poor with declining functional status.   Discharge Planning: To Be Determined      Primary Diagnoses: Present on Admission:  Leg ulcer ([place])  AKI (acute kidney injury) ([place])  DVT (deep venous thrombosis) ([facility])  Acute encephalopathy  HTN (hypertension)  Chronic diastolic CHF (congestive heart failure) ([facility])  Stage 3b chronic kidney disease ([place])  Dementia ([place])   I have reviewed the medical record, interviewed the patient and family, and examined the patient. The following aspects are pertinent.  Past Medical History:  Diagnosis Date   Cellulitis 10/28/2016   bilateral lower extremities.    Social History   Socioeconomic  History   Marital status: Widowed    Spouse name: Not on file   Number of children: Not on file   Years of education: Not on file   Highest education level: Not on file  Occupational History   Not on file  Tobacco Use   Smoking status: Never   Smokeless tobacco: Never  Substance and Sexual Activity   Alcohol use: No   Drug use: No   Sexual activity: Never  Other Topics Concern   Not on file  Social History Narrative   Not on file   Social Determinants of Health   Financial Resource Strain: Not on file  Food Insecurity: Not on file  Transportation Needs: Not on file  Physical Activity: Not on file  Stress: Not on file  Social Connections: Not on file   Family History  Problem Relation Age of Onset   Diabetes Mellitus II Mother    Heart disease Father    Scheduled Meds:  enoxaparin (LOVENOX) injection  1.5 mg/kg Subcutaneous Q24H   sodium chloride flush  3 mL Intravenous Q12H   Continuous Infusions:  ceFEPime (MAXIPIME) IV 2 g (01/17/22 0939)   dextrose 5 % and 0.45% NaCl 100 mL/hr at 01/16/22 2237   thiamine (VITAMIN B1) injection 500 mg (01/17/22 0941)   PRN Meds:.acetaminophen **OR** acetaminophen, ondansetron **OR** ondansetron (ZOFRAN) IV No Known Allergies Review of Systems  Unable to perform ROS: Dementia    Physical Exam Vitals and nursing note reviewed.  Constitutional:      Appearance: She is ill-appearing.  Cardiovascular:     Rate and Rhythm: Bradycardia present.  Pulmonary:     Effort: No tachypnea, accessory muscle usage or respiratory distress.  Abdominal:     General: Abdomen is flat.     Palpations: Abdomen is soft.  Neurological:     Mental Status: She is easily aroused. She is confused.     Comments: Tearful at times but easily calmed     Vital Signs: BP (!) 146/70 (BP Location: Right Arm)   Pulse (!) 56   Temp 97.6 F (36.4 C)   Resp 16   Ht 5\' 6"  (1.676 m)   Wt 87.2 kg   SpO2 97%   BMI 31.03 kg/m  Pain Scale: PAINAD    Pain Score: 0-No pain   SpO2: SpO2: 97 % O2 Device:SpO2: 97 % O2 Flow Rate: .O2 Flow Rate (L/min): 2 L/min  IO: Intake/output summary:  Intake/Output Summary (Last 24 hours) at 01/17/2022 1408 Last data filed at 01/17/2022 1134 Gross per 24 hour  Intake --  Output 1150 ml  Net -1150 ml    LBM:   Baseline Weight: Weight:  87.2 kg Most recent weight: Weight: 87.2 kg     Palliative Assessment/Data:     Time In: 1415  Time Total: 75 min  Greater than 50%  of this time was spent counseling and coordinating care related to the above assessment and plan.  Signed by: Vinie Sill, NP Palliative Medicine Team Pager # [phone] (M-F 8a-5p) Team Phone # [phone] (Nights/Weekends)
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
## 2022-01-18 ENCOUNTER — Other Ambulatory Visit (HOSPITAL_COMMUNITY): Payer: Self-pay

## 2022-01-18 DIAGNOSIS — G9340 Encephalopathy, unspecified: Secondary | ICD-10-CM | POA: Diagnosis not present

## 2022-01-18 LAB — CBC
HCT: 29.2 % — ABNORMAL LOW (ref 36.0–46.0)
Hemoglobin: 8.6 g/dL — ABNORMAL LOW (ref 12.0–15.0)
MCH: 29.2 pg (ref 26.0–34.0)
MCHC: 29.5 g/dL — ABNORMAL LOW (ref 30.0–36.0)
MCV: 99 fL (ref 80.0–100.0)
Platelets: 157 10*3/uL (ref 150–400)
RBC: 2.95 MIL/uL — ABNORMAL LOW (ref 3.87–5.11)
RDW: 14.6 % (ref 11.5–15.5)
WBC: 3.1 10*3/uL — ABNORMAL LOW (ref 4.0–10.5)
nRBC: 1 % — ABNORMAL HIGH (ref 0.0–0.2)

## 2022-01-18 LAB — COMPREHENSIVE METABOLIC PANEL WITH GFR
ALT: 38 U/L (ref 0–44)
AST: 34 U/L (ref 15–41)
Albumin: 2.5 g/dL — ABNORMAL LOW (ref 3.5–5.0)
Alkaline Phosphatase: 75 U/L (ref 38–126)
Anion gap: 4 — ABNORMAL LOW (ref 5–15)
BUN: 23 mg/dL (ref 8–23)
CO2: 22 mmol/L (ref 22–32)
Calcium: 8.3 mg/dL — ABNORMAL LOW (ref 8.9–10.3)
Chloride: 117 mmol/L — ABNORMAL HIGH (ref 98–111)
Creatinine, Ser: 1.26 mg/dL — ABNORMAL HIGH (ref 0.44–1.00)
GFR, Estimated: 40 mL/min — ABNORMAL LOW
Glucose, Bld: 93 mg/dL (ref 70–99)
Potassium: 4 mmol/L (ref 3.5–5.1)
Sodium: 143 mmol/L (ref 135–145)
Total Bilirubin: 0.5 mg/dL (ref 0.3–1.2)
Total Protein: 5.8 g/dL — ABNORMAL LOW (ref 6.5–8.1)

## 2022-01-18 LAB — LEGIONELLA PNEUMOPHILA SEROGP 1 UR AG: L. pneumophila Serogp 1 Ur Ag: NEGATIVE

## 2022-01-18 LAB — HCV RNA QUANT RFLX ULTRA OR GENOTYP
HCV RNA Qnt(log copy/mL): UNDETERMINED log10 IU/mL
HepC Qn: NOT DETECTED IU/mL

## 2022-01-18 MED ORDER — SODIUM CHLORIDE 0.9 % IV SOLN
300.0000 mg | Freq: Once | INTRAVENOUS | Status: AC
  Administered 2022-01-18: 300 mg via INTRAVENOUS
  Filled 2022-01-18: qty 300

## 2022-01-18 MED ORDER — VITAMIN C 500 MG PO TABS
500.0000 mg | ORAL_TABLET | Freq: Two times a day (BID) | ORAL | Status: DC
  Administered 2022-01-18 – 2022-01-19 (×3): 500 mg via ORAL
  Filled 2022-01-18 (×4): qty 1

## 2022-01-18 MED ORDER — ZINC SULFATE 220 (50 ZN) MG PO CAPS
220.0000 mg | ORAL_CAPSULE | Freq: Every day | ORAL | Status: DC
  Administered 2022-01-18 – 2022-01-19 (×2): 220 mg via ORAL
  Filled 2022-01-18 (×2): qty 1

## 2022-01-18 MED ORDER — ENSURE ENLIVE PO LIQD
237.0000 mL | Freq: Two times a day (BID) | ORAL | Status: DC
  Administered 2022-01-18 – 2022-01-19 (×3): 237 mL via ORAL

## 2022-01-18 MED ORDER — ADULT MULTIVITAMIN W/MINERALS CH
1.0000 | ORAL_TABLET | Freq: Every day | ORAL | Status: DC
  Administered 2022-01-18 – 2022-01-19 (×2): 1 via ORAL
  Filled 2022-01-18 (×2): qty 1

## 2022-01-18 MED ORDER — CEPHALEXIN 500 MG PO CAPS
500.0000 mg | ORAL_CAPSULE | Freq: Four times a day (QID) | ORAL | Status: DC
  Administered 2022-01-18 – 2022-01-19 (×5): 500 mg via ORAL
  Filled 2022-01-18 (×5): qty 1

## 2022-01-18 NOTE — TOC Progression Note (Signed)
Transition of Care [facility]) - Progression Note    Patient Details  Name: [name] [name] MRN: [id number] Date of Birth: 27-Oct-1926  Transition [facility]) CM/SW Contact  Laurena Slimmer, RN Phone Number: 01/18/2022, 11:05 AM  Clinical Narrative:    Retrieved message from [place] at [facility]. Patient is active on services.  Per palliative note family considering hospice care.    3:20pm Referral made to [facility].       Expected Discharge Plan and Services                                               Social Determinants of Health (SDOH) Interventions SDOH Screenings   Tobacco Use: Low Risk  (01/15/2022)    Readmission Risk Interventions     No data to display

## 2022-01-18 NOTE — Progress Notes (Addendum)
[place] [facility]) Hospital Liaison Note   Received request from Transitions of Care Manager, Donny Pique, for hospice services at home after discharge. Chart and patient information under review by [facility] physician.    Spoke with grandson/Jamie to initiate education related to hospice philosophy, services, and team approach to care. Roselyn Reef verbalized understanding of information given. Per discussion, the plan is for patient to discharge home via TBD once cleared to DC.    Family requests BST is ordered as patient has a hospital bed in the home. MSW confirmed with MD/Wouk, that O2 was not recommended upon D/C. Dr. Si Raider reports via [place] that patient was weaned off of O2 and will not need O2 in the home upon DC.   Please send signed and completed DNR home with patient/family. Please provide prescriptions at discharge as needed to ensure ongoing symptom management.    [facility] information and contact numbers given to family & above information shared with TOC.   Please call with any questions/concerns.    Thank you for the opportunity to participate in this patient's care.   Daphene Calamity, MSW [facility] Liaison  [phone]

## 2022-01-18 NOTE — Progress Notes (Signed)
Palliative:  HPI: 89 y.o. female  with past medical history of dementia, bilateral lymphedema, diastolic heart failure, DVT, hypertension admitted on 01/14/2022 with altered mental status with worsening confusion and lower extremity swelling also found to be hypothermic with concern for pneumonia vs wound infection with AKI. Noted PCP records with signs of decline and becoming difficult for her to physically get to her appointments. Overall failure to thrive in the setting of dementia, heart failure, and advanced age.    Noted plans for home with hospice. Agree with this plan. She is not currently a good candidate for hospice facility although this can be considered in the future if she has further decline at home. No further palliative needs.   No charge  Vinie Sill, NP Palliative Medicine Team Pager [phone] (Please see [email or web address] for schedule) Team Phone [phone]

## 2022-01-18 NOTE — Progress Notes (Addendum)
Nutrition Follow-up  DOCUMENTATION CODES:   Obesity unspecified  INTERVENTION:   -Ensure Enlive po BID, each supplement provides 350 kcal and 20 grams of protein -Magic cup TID with meals, each supplement provides 290 kcal and 9 grams of protein  -MVI with minerals daily -500 mg vitamin C BID -220 mg zinc sulfate daily -Feeding assistance with meals  NUTRITION DIAGNOSIS:   Inadequate oral intake related to inability to eat as evidenced by NPO status.  Progressing; advanced to PO diet on 01/17/22  GOAL:   Patient will meet greater than or equal to 90% of their needs  Progressing   MONITOR:   PO intake, Supplement acceptance, Diet advancement  REASON FOR ASSESSMENT:   Low Braden    ASSESSMENT:   Pt with medical history significant of dementia, bilateral lymphedema, previous DVT not on Eliquis, hypertension, who presents due to AMS.  1/8- advanced to dysphagia 2 diet  Reviewed I/O's: -548 ml x 24 hours and +1.5 L since admission  UOP: 1.7 L x 24 hours   Pt lying in bed at time of visit, but responsive to voice. Pt reports not feeling well, but unable to provide further insight. She denies being hungry. RD offered to assist with feeding and tray set-up, however, pt refused. Noted meal completions 10%.   Per palliative care notes, plan for DNR and watchful waiting. May pursue hopsice if no improvement. Per notes, pt with poor overall prognosis.   Medications reviewed and include dextrose 5%-0.45% sodium chloride infusion @ 100 ml/hr.   Labs reviewed.   Diet Order:   Diet Order             DIET DYS 2 Room service appropriate? Yes; Fluid consistency: Thin  Diet effective now                   EDUCATION NEEDS:   Not appropriate for education at this time  Skin:  Skin Assessment: Skin Integrity Issues: Skin Integrity Issues:: Other (Comment), Stage III, Stage IV Stage III: lt buttocks Stage IV: lt buttocks Other: chronic LLE wound; followed by the wound  center  Last BM:  Unknown  Height:   Ht Readings from Last 1 Encounters:  01/14/22 5\' 6"  (1.676 m)    Weight:   Wt Readings from Last 1 Encounters:  01/14/22 87.2 kg    Ideal Body Weight:  59.1 kg  BMI:  Body mass index is 31.03 kg/m.  Estimated Nutritional Needs:   Kcal:  1600-1800  Protein:  80-95 grams  Fluid:  > 1.6 L    Loistine Chance, RD, LDN, [place] Registered Dietitian II Certified Diabetes Care and Education Specialist Please refer to [facility] for RD and/or RD on-call/weekend/after hours pager

## 2022-01-18 NOTE — Plan of Care (Signed)
  Problem: Health Behavior/Discharge Planning: Goal: Ability to manage health-related needs will improve Outcome: Progressing   Problem: Clinical Measurements: Goal: Ability to maintain clinical measurements within normal limits will improve Outcome: Progressing   Problem: Clinical Measurements: Goal: Will remain free from infection Outcome: Progressing   

## 2022-01-18 NOTE — Evaluation (Signed)
Occupational Therapy Evaluation Patient Details Name: [name] [name] MRN: [id number] DOB: 1926/06/02 Today's Date: 01/18/2022   History of Present Illness 89 y/o female presented to ED on 01/14/21 for increasing confusion x 2 days and lower extremity swelling. Admitted for acute respiratory failure and acute encephalopathy 2/2 hypothermia. CXR showed PNA. PMH: dementia, bilateral lymphedema, HTN, diastolic CHF   Clinical Impression   Ms. Slevin was seen for OT/PT co-evaluation this date. Pt received semi-supine in bed, generally confused and oriented to self only. Unable to actively participate in therapy session. Per conversation with RN plan is for DC with hospice services. T/o evaluation pt states "I don't want it" and is unable to follow VCs consistently. Per chart, she requires significant assistance at from family members for ADL management and presents at or near her baseline level of functional independence. Do not anticipate pt would benefit from OT services at this time. Will sign off in house. Please re-consult of GOC change.       Recommendations for follow up therapy are one component of a multi-disciplinary discharge planning process, led by the attending physician.  Recommendations may be updated based on patient status, additional functional criteria and insurance authorization.   Follow Up Recommendations  Follow physician's recommendations for discharge plan and follow up therapies     Assistance Recommended at Discharge Frequent or constant Supervision/Assistance  Patient can return home with the following Two people to help with walking and/or transfers;Two people to help with bathing/dressing/bathroom;Assist for transportation;Help with stairs or ramp for entrance;Assistance with feeding;Assistance with cooking/housework;Direct supervision/assist for medications management    Functional Status Assessment  Patient has had a recent decline in their functional status and/or  demonstrates limited ability to make significant improvements in function in a reasonable and predictable amount of time  Equipment Recommendations       Recommendations for Other Services       Precautions / Restrictions Precautions Precautions: Fall Restrictions Weight Bearing Restrictions: No      Mobility Bed Mobility Overal bed mobility: Needs Assistance Bed Mobility: Rolling Rolling: Total assist, +2 for physical assistance, +2 for safety/equipment         General bed mobility comments: totalA+2 for all aspects of bed mobility. Repositioned higher in bed for comfort    Transfers                   General transfer comment: deferred      Balance                                           ADL either performed or assessed with clinical judgement   ADL Overall ADL's : At baseline                                       General ADL Comments: Per chart, pt requires significant assist for ADL management at baseline. GOC still yet to be established but per RN family planning to DC with hospice services.     Vision         Perception     Praxis      Pertinent Vitals/Pain Pain Assessment Faces Pain Scale: Hurts little more Pain Location: generalized Pain Descriptors / Indicators: Grimacing Pain Intervention(s): Limited activity within patient's tolerance, Monitored during session, Repositioned  Hand Dominance Right   Extremity/Trunk Assessment Upper Extremity Assessment Upper Extremity Assessment: Generalized weakness   Lower Extremity Assessment Lower Extremity Assessment: Generalized weakness       Communication Communication Communication: No difficulties   Cognition Arousal/Alertness: Lethargic Behavior During Therapy: Flat affect Overall Cognitive Status: History of cognitive impairments - at baseline                                 General Comments: hx of dementia. Oriented to self.  Lethargic throughout session. Awakens with stimulation     General Comments  Bilat LE/UE swelling noted. RN aware. BLE bandages intact at start/end of session.    Exercises Other Exercises Other Exercises: Pt education limited by cognition. attempted to educate on role of OT in acute setting and importance of functional activity during hospital stay. Pt states "I don't want it" repeatedly. No evidence of learnign.   Shoulder Instructions      Home Living Family/patient expects to be discharged to:: Private residence Living Arrangements: Other (Comment) (grandson) Available Help at Discharge: Family Type of Home: House                       Home Equipment: Conservation officer, nature (2 wheels);Rollator (4 wheels);Wheelchair - manual   Additional Comments: unable to obtain home information due to patient's current cognitive status and baseline dementia      Prior Functioning/Environment Prior Level of Function : Needs assist  Cognitive Assist : Mobility (cognitive);ADLs (cognitive)     Physical Assist : Mobility (physical);ADLs (physical) Mobility (physical): Bed mobility;Transfers ADLs (physical): Grooming;Bathing;Dressing;Toileting;IADLs Mobility Comments: poor historian and unable to provide PLOF ADLs Comments: Per chart, pt requires assist from son with all ADLs/IADLs        OT Problem List: Decreased strength;Decreased coordination      OT Treatment/Interventions:      OT Goals(Current goals can be found in the care plan section) Acute Rehab OT Goals Patient Stated Goal: None stated - Plan is for hospice at this time recommend DC from OT services OT Goal Formulation: Patient unable to participate in goal setting Time For Goal Achievement: 01/18/22  OT Frequency:      Co-evaluation              AM-PAC OT "6 Clicks" Daily Activity     Outcome Measure Help from another person eating meals?: Total Help from another person taking care of personal grooming?:  Total Help from another person toileting, which includes using toliet, bedpan, or urinal?: Total Help from another person bathing (including washing, rinsing, drying)?: Total Help from another person to put on and taking off regular upper body clothing?: Total Help from another person to put on and taking off regular lower body clothing?: Total 6 Click Score: 6   End of Session Nurse Communication: Mobility status  Activity Tolerance: Patient limited by lethargy;Other (comment) (Limited by cognition.) Patient left: in bed;with call bell/phone within reach;with bed alarm set  OT Visit Diagnosis: Other abnormalities of gait and mobility (R26.89);Other symptoms and signs involving cognitive function                Time: 2505-3976 OT Time Calculation (min): 14 min Charges:  OT General Charges $OT Visit: 1 Visit OT Evaluation $OT Eval Moderate Complexity: 1 Mod  Sumaiyah Markert Roosvelt Maser, M.S., OTR/L 01/18/22, 11:40 AM

## 2022-01-18 NOTE — Evaluation (Signed)
Physical Therapy Evaluation & Discharge Patient Details Name: [name] [name] MRN: [id number] DOB: 1926/06/02 Today's Date: 01/18/2022  History of Present Illness  89 y/o female presented to ED on 01/14/21 for increasing confusion x 2 days and lower extremity swelling. Admitted for acute respiratory failure and acute encephalopathy 2/2 hypothermia. CXR showed PNA. PMH: dementia, bilateral lymphedema, HTN, diastolic CHF  Clinical Impression  Patient admitted with the above. Per chart, patient lives with grandson who provides assistance. Patient unable to provide PLOF information due to current cognitive status and baseline dementia. Currently requiring totalA+2 for rolling and repositioning in bed. Resistive to attempts of mobility. Lethargic throughout session but awakens to stimulation. At end of session, patient stating "leave me alone". Per RN, family discussion of transition to hospice/comfort care. Patient is not at an appropriate level to participate with therapy services. No further skilled PT needs identified. Recommend LTC with no therapy follow up.        Recommendations for follow up therapy are one component of a multi-disciplinary discharge planning process, led by the attending physician.  Recommendations may be updated based on patient status, additional functional criteria and insurance authorization.  Follow Up Recommendations Long-term institutional care without follow-up therapy Can patient physically be transported by private vehicle: No    Assistance Recommended at Discharge Frequent or constant Supervision/Assistance  Patient can return home with the following  Two people to help with walking and/or transfers;Two people to help with bathing/dressing/bathroom;Assistance with cooking/housework;Assistance with feeding;Direct supervision/assist for medications management;Direct supervision/assist for financial management;Assist for transportation;Help with stairs or ramp for  entrance    Equipment Recommendations Hospital bed  Recommendations for Other Services       Functional Status Assessment Patient has had a recent decline in their functional status and/or demonstrates limited ability to make significant improvements in function in a reasonable and predictable amount of time     Precautions / Restrictions Precautions Precautions: Fall Restrictions Weight Bearing Restrictions: No      Mobility  Bed Mobility Overal bed mobility: Needs Assistance Bed Mobility: Rolling Rolling: Total assist, +2 for physical assistance, +2 for safety/equipment         General bed mobility comments: totalA+2 for all aspects of bed mobility. Repositioned higher in bed for comfort    Transfers                   General transfer comment: deferred    Ambulation/Gait                  Stairs            Wheelchair Mobility    Modified Rankin (Stroke Patients Only)       Balance                                             Pertinent Vitals/Pain Pain Assessment Pain Assessment: Faces Faces Pain Scale: Hurts little more Pain Location: generalized Pain Descriptors / Indicators: Grimacing Pain Intervention(s): Monitored during session, Repositioned    Home Living Family/patient expects to be discharged to:: Private residence Living Arrangements: Other (Comment) (grandson)                 Additional Comments: unable to obtain home information due to patient's current cognitive status and baseline dementia    Prior Function  Mobility Comments: poor historian and unable to provide PLOF       Hand Dominance   Dominant Hand: Right    Extremity/Trunk Assessment   Upper Extremity Assessment Upper Extremity Assessment: Defer to OT evaluation    Lower Extremity Assessment Lower Extremity Assessment: Generalized weakness       Communication   Communication: No difficulties   Cognition Arousal/Alertness: Lethargic Behavior During Therapy: Flat affect Overall Cognitive Status: History of cognitive impairments - at baseline                                 General Comments: hx of dementia. Oriented to self. Lethargic throughout session. Awakens with stimulation        General Comments General comments (skin integrity, edema, etc.): Swelling to B LEs and UEs    Exercises     Assessment/Plan    PT Assessment Patient does not need any further PT services  PT Problem List         PT Treatment Interventions      PT Goals (Current goals can be found in the Care Plan section)  Acute Rehab PT Goals Patient Stated Goal: did not state PT Goal Formulation: All assessment and education complete, DC therapy    Frequency       Co-evaluation               AM-PAC PT "6 Clicks" Mobility  Outcome Measure Help needed turning from your back to your side while in a flat bed without using bedrails?: Total Help needed moving from lying on your back to sitting on the side of a flat bed without using bedrails?: Total Help needed moving to and from a bed to a chair (including a wheelchair)?: Total Help needed standing up from a chair using your arms (e.g., wheelchair or bedside chair)?: Total Help needed to walk in hospital room?: Total Help needed climbing 3-5 steps with a railing? : Total 6 Click Score: 6    End of Session   Activity Tolerance: Patient limited by lethargy Patient left: in bed;with call bell/phone within reach;with bed alarm set Nurse Communication: Mobility status PT Visit Diagnosis: Muscle weakness (generalized) (M62.81)    Time: 5631-4970 PT Time Calculation (min) (ACUTE ONLY): 11 min   Charges:   PT Evaluation $PT Eval Moderate Complexity: 1 Mod          Charlize Hathaway A. Gilford Rile PT, DPT [facility] Acute [facility]   Holten Spano A Dayanira Giovannetti 01/18/2022, 9:09 AM
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
## 2022-01-18 NOTE — Progress Notes (Signed)
Pt. Oriented to self, on room air, and purewick. Does not have an appetite. Ate 20% of each meal and supplemental drinks. Wound dressing clean, dry, intact. Pt. resting, comfortable, and in no distress.

## 2022-01-18 NOTE — TOC Benefit Eligibility Note (Signed)
Patient Teacher, English as a foreign language completed.    The patient is currently admitted and upon discharge could be taking Eliquis Starter Pack.  The current 30 day co-pay is $0.00.   The patient is insured through [place], [place] Patient Advocate Specialist [place] Patient Advocate Team Direct Number: [phone]  Fax: [phone]

## 2022-01-19 DIAGNOSIS — G9340 Encephalopathy, unspecified: Secondary | ICD-10-CM | POA: Diagnosis not present

## 2022-01-19 LAB — COMPREHENSIVE METABOLIC PANEL
ALT: 29 U/L (ref 0–44)
AST: 26 U/L (ref 15–41)
Albumin: 2.4 g/dL — ABNORMAL LOW (ref 3.5–5.0)
Alkaline Phosphatase: 79 U/L (ref 38–126)
Anion gap: 3 — ABNORMAL LOW (ref 5–15)
BUN: 22 mg/dL (ref 8–23)
CO2: 21 mmol/L — ABNORMAL LOW (ref 22–32)
Calcium: 8.3 mg/dL — ABNORMAL LOW (ref 8.9–10.3)
Chloride: 118 mmol/L — ABNORMAL HIGH (ref 98–111)
Creatinine, Ser: 1.17 mg/dL — ABNORMAL HIGH (ref 0.44–1.00)
GFR, Estimated: 44 mL/min — ABNORMAL LOW (ref >60)
Glucose, Bld: 81 mg/dL (ref 70–99)
Potassium: 4.3 mmol/L (ref 3.5–5.1)
Sodium: 142 mmol/L (ref 135–145)
Total Bilirubin: 0.6 mg/dL (ref 0.3–1.2)
Total Protein: 5.6 g/dL — ABNORMAL LOW (ref 6.5–8.1)

## 2022-01-19 LAB — CBC
HCT: 29.3 % — ABNORMAL LOW (ref 36.0–46.0)
Hemoglobin: 8.7 g/dL — ABNORMAL LOW (ref 12.0–15.0)
MCH: 29.2 pg (ref 26.0–34.0)
MCHC: 29.7 g/dL — ABNORMAL LOW (ref 30.0–36.0)
MCV: 98.3 fL (ref 80.0–100.0)
Platelets: 165 10*3/uL (ref 150–400)
RBC: 2.98 MIL/uL — ABNORMAL LOW (ref 3.87–5.11)
RDW: 14.5 % (ref 11.5–15.5)
WBC: 3.7 10*3/uL — ABNORMAL LOW (ref 4.0–10.5)
nRBC: 1.1 % — ABNORMAL HIGH (ref 0.0–0.2)

## 2022-01-19 LAB — URINE CULTURE: Culture: 60000 — AB

## 2022-01-19 LAB — CULTURE, BLOOD (ROUTINE X 2)
Culture: NO GROWTH
Culture: NO GROWTH
Special Requests: ADEQUATE
Special Requests: ADEQUATE

## 2022-01-19 MED ORDER — ADULT MULTIVITAMIN W/MINERALS CH: 1.0000 | ORAL_TABLET | Freq: Every day | ORAL | 0 refills | Status: DC

## 2022-01-19 MED ORDER — ASCORBIC ACID 500 MG PO TABS: 500.0000 mg | ORAL_TABLET | Freq: Two times a day (BID) | ORAL | 0 refills | Status: DC

## 2022-01-19 MED ORDER — ZINC SULFATE 220 (50 ZN) MG PO CAPS: 220.0000 mg | ORAL_CAPSULE | Freq: Every day | ORAL | 0 refills | Status: DC

## 2022-01-19 MED ORDER — CEPHALEXIN 500 MG PO CAPS: 500.0000 mg | ORAL_CAPSULE | Freq: Three times a day (TID) | ORAL | 0 refills | Status: AC

## 2022-01-19 MED ORDER — ENSURE ENLIVE PO LIQD: 237.0000 mL | Freq: Two times a day (BID) | ORAL | 12 refills | Status: DC

## 2022-01-19 NOTE — Progress Notes (Signed)
[facility] ([facility])    If applicable, please send signed and completed DNR with patient/family upon discharge. Please provide prescriptions at discharge as needed to ensure ongoing symptom management and a transport packet.   [facility] information and contact numbers given to family and above information shared with TOC.    Please call with any questions/concerns.    Thank you for the opportunity to participate in this patient's care   Shania Daniel, MSW [facility] Hospital Liaison  [phone]  
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
## 2022-01-19 NOTE — Discharge Summary (Signed)
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 Coronavirus on the Respiratory Panel, DOES NOT test for the novel  Coronavirus (2019 nCoV)    Coronavirus HKU1 NOT DETECTED NOT DETECTED Final   Coronavirus NL63 NOT DETECTED NOT DETECTED Final   Coronavirus OC43 NOT DETECTED NOT DETECTED Final   Metapneumovirus NOT DETECTED NOT DETECTED Final   Rhinovirus / Enterovirus NOT DETECTED NOT DETECTED Final   Influenza A NOT DETECTED NOT DETECTED Final   Influenza B NOT DETECTED NOT DETECTED Final   Parainfluenza Virus 1 NOT DETECTED NOT DETECTED Final   Parainfluenza Virus 2 NOT DETECTED NOT DETECTED Final   Parainfluenza Virus 3 NOT DETECTED NOT DETECTED Final   Parainfluenza Virus 4 NOT DETECTED NOT DETECTED Final   Respiratory Syncytial Virus NOT DETECTED NOT DETECTED Final    Bordetella pertussis NOT DETECTED NOT DETECTED Final   Bordetella Parapertussis NOT DETECTED NOT DETECTED Final   Chlamydophila pneumoniae NOT DETECTED NOT DETECTED Final   Mycoplasma pneumoniae NOT DETECTED NOT DETECTED Final    Comment: Performed at [facility], [place] [address], [place],  [address]  SARS Coronavirus 2 by RT PCR (hospital order, performed in [facility] hospital lab) *cepheid single result test* Anterior Nasal Swab     Status: None   Collection Time: 01/16/22  1:35 PM   Specimen: Anterior Nasal Swab  Result Value Ref Range Status   SARS Coronavirus 2 by RT PCR NEGATIVE NEGATIVE Final    Comment: (NOTE) SARS-CoV-2 target nucleic acids are NOT DETECTED.  The SARS-CoV-2 RNA is generally detectable in upper and lower respiratory specimens during the acute phase of infection.  The lowest concentration of SARS-CoV-2 viral copies this assay can detect is 250 copies / mL. A negative result does not preclude SARS-CoV-2 infection and should not be used as the sole basis for treatment or other patient management decisions.  A negative result may occur with improper specimen collection / handling, submission of specimen other than nasopharyngeal swab, presence of viral mutation(s) within the areas targeted by this assay, and inadequate number of viral copies (<250 copies / mL). A negative result must be combined with clinical observations, patient history, and epidemiological information.  Fact Sheet for Patients:   RoadLapTop.co.za  Fact Sheet for Healthcare Providers: http://kim-miller.com/  This test is not yet approved or  cleared by the [place] FDA and has been authorized for detection and/or diagnosis of SARS-CoV-2 by FDA under an Emergency Use Authorization (EUA).  This EUA will remain in effect (meaning this test can be used) for the duration of the COVID-19 declaration under Section 564(b)(1) of the  Act, 21 U.S.C. section 360bbb-3(b)(1), unless the authorization is terminated or revoked sooner.  Performed at [facility], [address] [place], [place] [address]     Labs: CBC: Recent Labs  Lab 01/14/22 1434 01/15/22 0500 01/16/22 0427 01/17/22 0607 01/18/22 0646 01/19/22 0431  WBC 4.9 4.8 4.4 3.9* 3.1* 3.7*  NEUTROABS 3.9 3.8  --   --   --   --   HGB 10.1* 8.2* 8.6* 8.0* 8.6* 8.7*  HCT 34.3* 27.2* 28.6* 27.3* 29.2* 29.3*  MCV 98.3 98.2 96.0 98.6 99.0 98.3  PLT 230 192 205 168 157 165   Basic Metabolic Panel: Recent Labs  Lab 01/15/22 0500 01/16/22 0427 01/17/22 0607 01/18/22 0646 01/19/22 0431  NA 146* 145 143 143 142  K 5.2* 4.5 3.9 4.0 4.3  CL 119* 117* 116* 117* 118*  CO2 20* 20* 20* 22 21*  GLUCOSE 75 100* 102* 93 81  BUN 32* 32* 25* 23 22  CREATININE 1.27* 1.28* 1.23* 1.26* 1.17*  CALCIUM 8.2* 8.4* 8.1* 8.3* 8.3*   Liver Function Tests: Recent Labs  Lab 01/15/22 0500 01/16/22 0427 01/17/22 0607 01/18/22 0646 01/19/22 0431  AST 82* 73* 42* 34 26  ALT 60* 57* 43 38 29  ALKPHOS 91 88 74 75 79  BILITOT 0.8 0.9 0.6 0.5 0.6  PROT 5.5* 6.0* 5.5* 5.8* 5.6*  ALBUMIN 2.6* 2.7* 2.4* 2.5* 2.4*   CBG: Recent Labs  Lab 01/15/22 1410  GLUCAP 79    Discharge time spent: greater than 30 minutes.  Signed: Enedina Finner, MD [facility] 01/19/2022

## 2022-01-19 NOTE — TOC Transition Note (Signed)
Transition of Care [facility]) - CM/SW Discharge Note   Patient Details  Name: Brealynn Contino MRN: [id number] Date of Birth: [birth year]  Transition of Care [facility]) CM/SW Contact:  Candie Chroman, LCSW Phone Number: 01/19/2022, 2:14 PM   Clinical Narrative: Patient has orders to discharge home today. [facility] liaison is aware. EMS transport has been arranged and she is 5th on the list. No further concerns. CSW signing off.    Final next level of care: Home w Hospice Care Barriers to Discharge: Barriers Resolved   Patient Goals and CMS Choice   Choice offered to / list presented to : Adult Children  Discharge Placement                  Patient to be transferred to facility by: EMS Name of family member notified: Havilah Topor Patient and family notified of of transfer: 01/19/22  Discharge Plan and Services Additional resources added to the After Visit Summary for                                       Social Determinants of Health (SDOH) Interventions SDOH Screenings   Tobacco Use: Low Risk  (01/15/2022)     Readmission Risk Interventions     No data to display

## 2022-01-19 NOTE — TOC Progression Note (Signed)
Transition of Care [facility]) - Progression Note    Patient Details  Name: [name] [name] MRN: [id number] Date of Birth: 01-19-1926  Transition [facility] Care [facility]) CM/SW Patrick, LCSW Phone Number: 01/19/2022, 1:09 PM  Clinical Narrative:   Yolanda Bonine will call CSW back when he returns home so EMS transport time can be coordinated.  Expected Discharge Plan and Services         Expected Discharge Date: 01/19/22                                     Social Determinants of Health (SDOH) Interventions SDOH Screenings   Tobacco Use: Low Risk  (01/15/2022)    Readmission Risk Interventions     No data to display

## 2022-01-26 ENCOUNTER — Ambulatory Visit: Payer: Medicare HMO | Admitting: Internal Medicine

## 2022-01-31 ENCOUNTER — Encounter: Payer: Medicare HMO | Admitting: Family

## 2022-02-04 DIAGNOSIS — L089 Local infection of the skin and subcutaneous tissue, unspecified: Secondary | ICD-10-CM

## 2022-02-07 DIAGNOSIS — I739 Peripheral vascular disease, unspecified: Secondary | ICD-10-CM | POA: Diagnosis not present

## 2022-02-07 DIAGNOSIS — F01511 Vascular dementia, unspecified severity, with agitation: Secondary | ICD-10-CM | POA: Diagnosis not present

## 2022-02-07 DIAGNOSIS — L89620 Pressure ulcer of left heel, unstageable: Secondary | ICD-10-CM | POA: Diagnosis not present

## 2022-02-07 DIAGNOSIS — L89323 Pressure ulcer of left buttock, stage 3: Secondary | ICD-10-CM | POA: Diagnosis not present

## 2022-02-07 DIAGNOSIS — L89893 Pressure ulcer of other site, stage 3: Secondary | ICD-10-CM | POA: Diagnosis not present

## 2022-02-07 DIAGNOSIS — L89890 Pressure ulcer of other site, unstageable: Secondary | ICD-10-CM | POA: Diagnosis not present

## 2022-02-07 DIAGNOSIS — I69818 Other symptoms and signs involving cognitive functions following other cerebrovascular disease: Secondary | ICD-10-CM | POA: Diagnosis not present

## 2022-04-22 DIAGNOSIS — L89323 Pressure ulcer of left buttock, stage 3: Secondary | ICD-10-CM | POA: Diagnosis not present

## 2022-04-22 DIAGNOSIS — L89893 Pressure ulcer of other site, stage 3: Secondary | ICD-10-CM | POA: Diagnosis not present

## 2022-04-22 DIAGNOSIS — I739 Peripheral vascular disease, unspecified: Secondary | ICD-10-CM | POA: Diagnosis not present

## 2022-04-22 DIAGNOSIS — F01511 Vascular dementia, unspecified severity, with agitation: Secondary | ICD-10-CM | POA: Diagnosis not present

## 2022-04-22 DIAGNOSIS — I69818 Other symptoms and signs involving cognitive functions following other cerebrovascular disease: Secondary | ICD-10-CM | POA: Diagnosis not present

## 2022-04-22 DIAGNOSIS — L89890 Pressure ulcer of other site, unstageable: Secondary | ICD-10-CM | POA: Diagnosis not present

## 2022-04-22 DIAGNOSIS — L89620 Pressure ulcer of left heel, unstageable: Secondary | ICD-10-CM | POA: Diagnosis not present

## 2022-07-01 DIAGNOSIS — N1832 Chronic kidney disease, stage 3b: Secondary | ICD-10-CM | POA: Diagnosis not present

## 2022-07-01 DIAGNOSIS — I5032 Chronic diastolic (congestive) heart failure: Secondary | ICD-10-CM | POA: Diagnosis not present

## 2022-07-01 DIAGNOSIS — F0152 Vascular dementia, unspecified severity, with psychotic disturbance: Secondary | ICD-10-CM | POA: Diagnosis not present

## 2022-07-01 DIAGNOSIS — I69818 Other symptoms and signs involving cognitive functions following other cerebrovascular disease: Secondary | ICD-10-CM | POA: Diagnosis not present

## 2022-07-01 DIAGNOSIS — I890 Lymphedema, not elsewhere classified: Secondary | ICD-10-CM | POA: Diagnosis not present

## 2022-07-01 DIAGNOSIS — F01511 Vascular dementia, unspecified severity, with agitation: Secondary | ICD-10-CM | POA: Diagnosis not present

## 2022-07-01 DIAGNOSIS — I130 Hypertensive heart and chronic kidney disease with heart failure and stage 1 through stage 4 chronic kidney disease, or unspecified chronic kidney disease: Secondary | ICD-10-CM | POA: Diagnosis not present

## 2022-09-05 DIAGNOSIS — F01511 Vascular dementia, unspecified severity, with agitation: Secondary | ICD-10-CM | POA: Diagnosis not present

## 2022-09-05 DIAGNOSIS — I130 Hypertensive heart and chronic kidney disease with heart failure and stage 1 through stage 4 chronic kidney disease, or unspecified chronic kidney disease: Secondary | ICD-10-CM | POA: Diagnosis not present

## 2022-09-05 DIAGNOSIS — I69818 Other symptoms and signs involving cognitive functions following other cerebrovascular disease: Secondary | ICD-10-CM | POA: Diagnosis not present

## 2022-09-05 DIAGNOSIS — F0152 Vascular dementia, unspecified severity, with psychotic disturbance: Secondary | ICD-10-CM | POA: Diagnosis not present

## 2022-09-05 DIAGNOSIS — I5032 Chronic diastolic (congestive) heart failure: Secondary | ICD-10-CM | POA: Diagnosis not present

## 2022-09-05 DIAGNOSIS — N1832 Chronic kidney disease, stage 3b: Secondary | ICD-10-CM | POA: Diagnosis not present

## 2022-09-05 DIAGNOSIS — I890 Lymphedema, not elsewhere classified: Secondary | ICD-10-CM | POA: Diagnosis not present

## 2022-09-13 DIAGNOSIS — N1832 Chronic kidney disease, stage 3b: Secondary | ICD-10-CM | POA: Diagnosis not present

## 2022-09-13 DIAGNOSIS — I69818 Other symptoms and signs involving cognitive functions following other cerebrovascular disease: Secondary | ICD-10-CM | POA: Diagnosis not present

## 2022-09-13 DIAGNOSIS — I5032 Chronic diastolic (congestive) heart failure: Secondary | ICD-10-CM | POA: Diagnosis not present

## 2022-09-13 DIAGNOSIS — F0152 Vascular dementia, unspecified severity, with psychotic disturbance: Secondary | ICD-10-CM | POA: Diagnosis not present

## 2022-09-13 DIAGNOSIS — F01511 Vascular dementia, unspecified severity, with agitation: Secondary | ICD-10-CM | POA: Diagnosis not present

## 2022-09-13 DIAGNOSIS — I890 Lymphedema, not elsewhere classified: Secondary | ICD-10-CM | POA: Diagnosis not present

## 2022-09-13 DIAGNOSIS — I130 Hypertensive heart and chronic kidney disease with heart failure and stage 1 through stage 4 chronic kidney disease, or unspecified chronic kidney disease: Secondary | ICD-10-CM | POA: Diagnosis not present

## 2022-09-14 DIAGNOSIS — I890 Lymphedema, not elsewhere classified: Secondary | ICD-10-CM | POA: Diagnosis not present

## 2022-09-14 DIAGNOSIS — I130 Hypertensive heart and chronic kidney disease with heart failure and stage 1 through stage 4 chronic kidney disease, or unspecified chronic kidney disease: Secondary | ICD-10-CM | POA: Diagnosis not present

## 2022-09-14 DIAGNOSIS — I69818 Other symptoms and signs involving cognitive functions following other cerebrovascular disease: Secondary | ICD-10-CM | POA: Diagnosis not present

## 2022-09-14 DIAGNOSIS — I5032 Chronic diastolic (congestive) heart failure: Secondary | ICD-10-CM | POA: Diagnosis not present

## 2022-09-14 DIAGNOSIS — F0152 Vascular dementia, unspecified severity, with psychotic disturbance: Secondary | ICD-10-CM | POA: Diagnosis not present

## 2022-09-14 DIAGNOSIS — F01511 Vascular dementia, unspecified severity, with agitation: Secondary | ICD-10-CM | POA: Diagnosis not present

## 2022-09-14 DIAGNOSIS — N1832 Chronic kidney disease, stage 3b: Secondary | ICD-10-CM | POA: Diagnosis not present

## 2023-03-08 ENCOUNTER — Emergency Department: Payer: Medicare HMO

## 2023-03-08 ENCOUNTER — Inpatient Hospital Stay
Admission: EM | Admit: 2023-03-08 | Discharge: 2023-03-11 | DRG: 193 | Disposition: A | Discharge status: Hospice | Payer: Medicare HMO | Attending: Internal Medicine | Admitting: Internal Medicine

## 2023-03-08 ENCOUNTER — Other Ambulatory Visit: Payer: Self-pay

## 2023-03-08 DIAGNOSIS — F0390 Unspecified dementia without behavioral disturbance: Secondary | ICD-10-CM | POA: Diagnosis present

## 2023-03-08 DIAGNOSIS — D72819 Decreased white blood cell count, unspecified: Secondary | ICD-10-CM | POA: Diagnosis not present

## 2023-03-08 DIAGNOSIS — R531 Weakness: Secondary | ICD-10-CM | POA: Diagnosis present

## 2023-03-08 DIAGNOSIS — R058 Other specified cough: Secondary | ICD-10-CM | POA: Diagnosis not present

## 2023-03-08 DIAGNOSIS — F03918 Unspecified dementia, unspecified severity, with other behavioral disturbance: Secondary | ICD-10-CM | POA: Diagnosis not present

## 2023-03-08 DIAGNOSIS — N390 Urinary tract infection, site not specified: Secondary | ICD-10-CM | POA: Diagnosis not present

## 2023-03-08 DIAGNOSIS — J811 Chronic pulmonary edema: Secondary | ICD-10-CM | POA: Diagnosis not present

## 2023-03-08 DIAGNOSIS — Z90710 Acquired absence of both cervix and uterus: Secondary | ICD-10-CM | POA: Diagnosis not present

## 2023-03-08 DIAGNOSIS — N1832 Chronic kidney disease, stage 3b: Secondary | ICD-10-CM | POA: Diagnosis not present

## 2023-03-08 DIAGNOSIS — R456 Violent behavior: Secondary | ICD-10-CM | POA: Diagnosis not present

## 2023-03-08 DIAGNOSIS — B961 Klebsiella pneumoniae [K. pneumoniae] as the cause of diseases classified elsewhere: Secondary | ICD-10-CM | POA: Diagnosis present

## 2023-03-08 DIAGNOSIS — Z86718 Personal history of other venous thrombosis and embolism: Secondary | ICD-10-CM | POA: Diagnosis not present

## 2023-03-08 DIAGNOSIS — I5032 Chronic diastolic (congestive) heart failure: Secondary | ICD-10-CM | POA: Diagnosis not present

## 2023-03-08 DIAGNOSIS — Z743 Need for continuous supervision: Secondary | ICD-10-CM | POA: Diagnosis not present

## 2023-03-08 DIAGNOSIS — R0902 Hypoxemia: Secondary | ICD-10-CM | POA: Diagnosis not present

## 2023-03-08 DIAGNOSIS — Z8249 Family history of ischemic heart disease and other diseases of the circulatory system: Secondary | ICD-10-CM

## 2023-03-08 DIAGNOSIS — G9341 Metabolic encephalopathy: Secondary | ICD-10-CM | POA: Diagnosis not present

## 2023-03-08 DIAGNOSIS — Z66 Do not resuscitate: Secondary | ICD-10-CM | POA: Diagnosis not present

## 2023-03-08 DIAGNOSIS — Z833 Family history of diabetes mellitus: Secondary | ICD-10-CM | POA: Diagnosis not present

## 2023-03-08 DIAGNOSIS — Z1152 Encounter for screening for COVID-19: Secondary | ICD-10-CM | POA: Diagnosis not present

## 2023-03-08 DIAGNOSIS — B952 Enterococcus as the cause of diseases classified elsewhere: Secondary | ICD-10-CM | POA: Diagnosis present

## 2023-03-08 DIAGNOSIS — J101 Influenza due to other identified influenza virus with other respiratory manifestations: Secondary | ICD-10-CM | POA: Diagnosis not present

## 2023-03-08 DIAGNOSIS — L89322 Pressure ulcer of left buttock, stage 2: Secondary | ICD-10-CM | POA: Diagnosis not present

## 2023-03-08 DIAGNOSIS — R509 Fever, unspecified: Secondary | ICD-10-CM | POA: Diagnosis not present

## 2023-03-08 DIAGNOSIS — R279 Unspecified lack of coordination: Secondary | ICD-10-CM | POA: Diagnosis not present

## 2023-03-08 DIAGNOSIS — I517 Cardiomegaly: Secondary | ICD-10-CM | POA: Diagnosis not present

## 2023-03-08 DIAGNOSIS — R059 Cough, unspecified: Secondary | ICD-10-CM | POA: Diagnosis not present

## 2023-03-08 LAB — COMPREHENSIVE METABOLIC PANEL
ALT: 13 U/L (ref 0–44)
AST: 25 U/L (ref 15–41)
Albumin: 3.1 g/dL — ABNORMAL LOW (ref 3.5–5.0)
Alkaline Phosphatase: 50 U/L (ref 38–126)
Anion gap: 11 (ref 5–15)
BUN: 34 mg/dL — ABNORMAL HIGH (ref 8–23)
CO2: 23 mmol/L (ref 22–32)
Calcium: 8.5 mg/dL — ABNORMAL LOW (ref 8.9–10.3)
Chloride: 105 mmol/L (ref 98–111)
Creatinine, Ser: 1.25 mg/dL — ABNORMAL HIGH (ref 0.44–1.00)
GFR, Estimated: 40 mL/min — ABNORMAL LOW (ref >60)
Glucose, Bld: 100 mg/dL — ABNORMAL HIGH (ref 70–99)
Potassium: 4.3 mmol/L (ref 3.5–5.1)
Sodium: 139 mmol/L (ref 135–145)
Total Bilirubin: 0.9 mg/dL (ref 0.0–1.2)
Total Protein: 6.8 g/dL (ref 6.5–8.1)

## 2023-03-08 LAB — URINALYSIS, COMPLETE (UACMP) WITH MICROSCOPIC
Bilirubin Urine: NEGATIVE
Glucose, UA: NEGATIVE mg/dL
Ketones, ur: NEGATIVE mg/dL
Nitrite: NEGATIVE
Protein, ur: 30 mg/dL — AB
Specific Gravity, Urine: 1.017 (ref 1.005–1.030)
WBC, UA: >50 WBC/hpf (ref 0–5)
pH: 5 (ref 5.0–8.0)

## 2023-03-08 LAB — CBC
HCT: 38.8 % (ref 36.0–46.0)
Hemoglobin: 12 g/dL (ref 12.0–15.0)
MCH: 31.4 pg (ref 26.0–34.0)
MCHC: 30.9 g/dL (ref 30.0–36.0)
MCV: 101.6 fL — ABNORMAL HIGH (ref 80.0–100.0)
Platelets: 142 10*3/uL — ABNORMAL LOW (ref 150–400)
RBC: 3.82 MIL/uL — ABNORMAL LOW (ref 3.87–5.11)
RDW: 12.9 % (ref 11.5–15.5)
WBC: 4.2 10*3/uL (ref 4.0–10.5)
nRBC: 0 % (ref 0.0–0.2)

## 2023-03-08 LAB — RESP PANEL BY RT-PCR (RSV, FLU A&B, COVID)  RVPGX2
Influenza A by PCR: POSITIVE — AB
Influenza B by PCR: NEGATIVE
Resp Syncytial Virus by PCR: NEGATIVE
SARS Coronavirus 2 by RT PCR: NEGATIVE

## 2023-03-08 MED ORDER — ONDANSETRON HCL 4 MG/2ML IJ SOLN
4.0000 mg | Freq: Four times a day (QID) | INTRAMUSCULAR | Status: DC | PRN
Start: 2023-03-08 — End: 2023-03-11

## 2023-03-08 MED ORDER — ACETAMINOPHEN 325 MG PO TABS: 650.0000 mg | ORAL_TABLET | Freq: Four times a day (QID) | ORAL | Status: DC | PRN

## 2023-03-08 MED ORDER — ENOXAPARIN SODIUM 30 MG/0.3ML IJ SOSY
30.0000 mg | PREFILLED_SYRINGE | INTRAMUSCULAR | Status: DC
  Administered 2023-03-08 – 2023-03-10 (×3): 30 mg via SUBCUTANEOUS
  Filled 2023-03-08 (×3): qty 0.3

## 2023-03-08 MED ORDER — OSELTAMIVIR PHOSPHATE 30 MG PO CAPS
30.0000 mg | ORAL_CAPSULE | Freq: Every day | ORAL | Status: DC
Start: 2023-03-08 — End: 2023-03-11
  Administered 2023-03-09 – 2023-03-11 (×3): 30 mg via ORAL
  Filled 2023-03-08 (×4): qty 1

## 2023-03-08 MED ORDER — OSELTAMIVIR PHOSPHATE 30 MG PO CAPS: 30.0000 mg | ORAL_CAPSULE | Freq: Two times a day (BID) | ORAL | Status: DC

## 2023-03-08 MED ORDER — SODIUM CHLORIDE 0.9 % IV SOLN
1.0000 g | Freq: Once | INTRAVENOUS | Status: AC
  Administered 2023-03-08: 1 g via INTRAVENOUS
  Filled 2023-03-08: qty 10

## 2023-03-08 MED ORDER — ONDANSETRON HCL 4 MG PO TABS: 4.0000 mg | ORAL_TABLET | Freq: Four times a day (QID) | ORAL | Status: DC | PRN

## 2023-03-08 MED ORDER — SODIUM CHLORIDE 0.9 % IV SOLN
1.0000 g | INTRAVENOUS | Status: AC
  Administered 2023-03-09 – 2023-03-11 (×3): 1 g via INTRAVENOUS
  Filled 2023-03-08 (×3): qty 10

## 2023-03-08 MED ORDER — QUETIAPINE FUMARATE 25 MG PO TABS
25.0000 mg | ORAL_TABLET | Freq: Every day | ORAL | Status: DC
  Administered 2023-03-08 – 2023-03-10 (×3): 25 mg via ORAL
  Filled 2023-03-08 (×3): qty 1

## 2023-03-08 MED ORDER — ENOXAPARIN SODIUM 40 MG/0.4ML IJ SOSY: 40.0000 mg | PREFILLED_SYRINGE | INTRAMUSCULAR | Status: DC

## 2023-03-08 MED ORDER — HALOPERIDOL LACTATE 5 MG/ML IJ SOLN: 2.0000 mg | Freq: Four times a day (QID) | INTRAMUSCULAR | Status: DC | PRN

## 2023-03-08 MED ORDER — SODIUM CHLORIDE 0.9 % IV BOLUS
1000.0000 mL | Freq: Once | INTRAVENOUS | Status: AC
  Administered 2023-03-08: 1000 mL via INTRAVENOUS

## 2023-03-08 MED ORDER — ACETAMINOPHEN 650 MG RE SUPP: 650.0000 mg | Freq: Four times a day (QID) | RECTAL | Status: DC | PRN

## 2023-03-08 NOTE — Progress Notes (Signed)
 Called patients grandson Merryl Buckels to get patients admission started and he said that now was not a good time. He asked if we could please call back.

## 2023-03-08 NOTE — ED Triage Notes (Signed)
 Pt arrives via ems from home, pt's poa is her grandson, ems reports that hospice and home health were in the home at their arrival, pt has a productive cough per family and hasn't eaten in 2 days, pt is attempting to hit ems on arrival. Ems reports that she has also attempted to bite them. Pt states "this is a hell hole, I told you I didn't want to be here"

## 2023-03-08 NOTE — Progress Notes (Signed)
 PHARMACIST - PHYSICIAN COMMUNICATION  CONCERNING:  Enoxaparin (Lovenox) for DVT Prophylaxis    RECOMMENDATION: Patient was prescribed enoxaprin 40mg  q24 hours for VTE prophylaxis.   Filed Weights   03/08/23 1104  Weight: 73.3 kg (161 lb 8 oz)    Body mass index is 26.88 kg/m.  Estimated Creatinine Clearance: 27.6 mL/min (A) (by C-G formula based on SCr of 1.25 mg/dL (H)).   Patient is candidate for enoxaparin 30mg  every 24 hours based on CrCl <47ml/min   DESCRIPTION: Pharmacy has adjusted enoxaparin dose per [facility] policy.  Patient is now receiving enoxaparin 30 mg every 24 hours    Gardner Candle, PharmD, BCPS Clinical Pharmacist 03/08/2023 3:08 PM

## 2023-03-08 NOTE — ED Provider Notes (Signed)
 [facility] Provider Note    Event Date/Time   First MD Initiated Contact with Patient 03/08/23 1102     (approximate)  History   Chief Complaint: Cough  HPI  Fizza Scales is a 89 y.o. female who presents to the emergency department for generalized weakness.  According to EMS over last couple days patient has been experiencing weakness has not been eating or drinking.  EMS states low-grade borderline axillary temperature, afebrile oral temperature 98.8 upon arrival.  There is also report that the patient has been coughing over the last couple days.  I spoke to the patient's grandson who is the patient's power of attorney over the phone.  He states for last 3 days she has been coughing and has eaten and drinking very little, today she was refusing to eat or drink anything I was acting very somnolent and fatigued.  [name] was concerned so he called EMS to bring her to the emergency department.  He confirms that he would like full scope of care including lab work chest x-ray, fluid and antibiotics if needed, admission if needed.  Physical Exam   Triage Vital Signs: ED Triage Vitals  Encounter Vitals Group     BP 03/08/23 1103 (!) 152/73     Systolic BP Percentile --      Diastolic BP Percentile --      Pulse Rate 03/08/23 1103 83     Resp 03/08/23 1103 16     Temp 03/08/23 1103 98.8 F (37.1 C)     Temp Source 03/08/23 1103 Oral     SpO2 03/08/23 1103 99 %     Weight 03/08/23 1104 161 lb 8 oz (73.3 kg)     Height 03/08/23 1104 5\' 5"  (1.651 m)     Head Circumference --      Peak Flow --      Pain Score 03/08/23 1104 0     Pain Loc --      Pain Education --      Exclude from Growth Chart --     Most recent vital signs: Vitals:   03/08/23 1103  BP: (!) 152/73  Pulse: 83  Resp: 16  Temp: 98.8 F (37.1 C)  SpO2: 99%    General: Awake, no distress.  Patient denies any complaints. CV:  Good peripheral perfusion.  Regular rate and rhythm   Resp:  Normal effort.  Equal breath sounds bilaterally.  Abd:  No distention.  Soft, nontender.  No rebound or guarding.  ED Results / Procedures / Treatments   RADIOLOGY  I reviewed interpreted chest x-ray images.  No obvious significant consolidation on my evaluation. Radiology is read the x-ray as chronic interstitial edema.   MEDICATIONS ORDERED IN ED: Medications  sodium chloride 0.9 % bolus 1,000 mL (has no administration in time range)     IMPRESSION / MDM / ASSESSMENT AND PLAN / ED COURSE  I reviewed the triage vital signs and the nursing notes.  Patient's presentation is most consistent with acute presentation with potential threat to life or bodily function.  Patient presents to the emergency department for cough generalized weakness fatigue over the last 3 days.  Grandson states patient is eating and drinking very little and today would not eat or drink anything which is what prompted the EMS phone call to bring the patient to the hospital.  [name] does confirm he wants full scope of care including labs antibiotics if needed fluids if needed, etc. we will check  labs given the cough will obtain a COVID/flu swab, chest x-ray.  Given the report of decreased intake we will dose IV fluids while waiting for the results.  Patient's workup has resulted positive for influenza A likely the cause for a lot of the patient's weakness fatigue and decreased fluid and food intake.  Patient's chemistry shows mild renal insufficiency otherwise no significant finding.  Patient's urinalysis shows significant urinary tract infection on an In-N-Out cath sample.  Will start the patient on IV Rocephin and send urine culture.  CBC reassuringly normal with a normal white blood cell count.  Chest x-ray does not appear to show any significant finding.  Will admit to the hospital service for further workup and treatment.  FINAL CLINICAL IMPRESSION(S) / ED DIAGNOSES   Influenza A Urinary tract  infection Weakness  Note:  This document was prepared using Dragon voice recognition software and may include unintentional dictation errors.   Minna Antis, MD 03/08/23 1444

## 2023-03-08 NOTE — Progress Notes (Signed)
 PHARMACY NOTE:  ANTIMICROBIAL RENAL DOSAGE ADJUSTMENT  Current antimicrobial regimen includes a mismatch between antimicrobial dosage and estimated renal function.  As per policy approved by the Pharmacy & Therapeutics and Medical Executive Committees, the antimicrobial dosage will be adjusted accordingly.  Current antimicrobial dosage:  Oseltamivir 30mg  BID  Indication: Influenza A   Renal Function:  Estimated Creatinine Clearance: 27.6 mL/min (A) (by C-G formula based on SCr of 1.25 mg/dL (H)).     Antimicrobial dosage has been changed to:  Oseltamivir 30mg  Daily   Additional comments:   Thank you for allowing pharmacy to be a part of this patient's care.  Gardner Candle, PharmD, BCPS Clinical Pharmacist 03/08/2023 3:26 PM

## 2023-03-08 NOTE — H&P (Addendum)
 History and Physical:    [name] [name]   GNF:[id number] DOB: 1926/12/19 DOA: 03/08/2023  Referring MD/provider: Minna Antis, MD PCP: Mick Sell, MD   Patient coming from: Home  Chief Complaint: General Weakness  History of Present Illness:   [name] [name] is a 89 y.o. female with medical history significant for chronic diastolic CHF, dementia, CKD stage IIIb, history of DVT, who was brought to the hospital because of cough, general weakness and change in mental status.  Patient is confused and cannot provide any history.  History was obtained from [place] (patient's grandson) and Dr. Lenard Lance, ED physician.  [name] said that patient was not responding this morning.  However, "when the nurse came to clean her up, she started taking trash".  Reportedly, patient has been eating poorly at home and has become more confused.  She has also been coughing for about 3 days.  [name] confirmed that patient is a DNR.  ED Course:  The patient found to have UTI and tested positive for influenza A.  ROS:   ROS unable to obtain review of systems because patient is confused and does not want to provide any history.  Past Medical History:   Past Medical History:  Diagnosis Date   Cellulitis 10/28/2016   bilateral lower extremities.     Past Surgical History:   Past Surgical History:  Procedure Laterality Date   ABDOMINAL HYSTERECTOMY     GANGLION CYST EXCISION     LOWER EXTREMITY VENOGRAPHY Left 12/14/2020   Procedure: LOWER EXTREMITY VENOGRAPHY;  Surgeon: Annice Needy, MD;  Location: [facility] INVASIVE CV LAB;  Service: Cardiovascular;  Laterality: Left;    Social History:   Social History   Socioeconomic History   Marital status: Widowed    Spouse name: Not on file   Number of children: Not on file   Years of education: Not on file   Highest education level: Not on file  Occupational History   Not on file  Tobacco Use   Smoking status: Never   Smokeless tobacco:  Never  Substance and Sexual Activity   Alcohol use: No   Drug use: No   Sexual activity: Never  Other Topics Concern   Not on file  Social History Narrative   Not on file   Social Drivers of Health   Financial Resource Strain: Not on file  Food Insecurity: No Food Insecurity (03/19/2019)   Received from [facility], [facility]   Hunger Vital Sign    Worried About Running Out of Food in the Last Year: Never true    Ran Out of Food in the Last Year: Never true  Transportation Needs: Not on file  Physical Activity: Not on file  Stress: Not on file  Social Connections: Not on file  Intimate Partner Violence: Not on file    Allergies   Patient has no known allergies.  Family history:   Family History  Problem Relation Age of Onset   Diabetes Mellitus II Mother    Heart disease Father     Current Medications:   Prior to Admission medications   Medication Sig Start Date End Date Taking? Authorizing Provider  acetaminophen (TYLENOL) 500 MG tablet Take 1 tablet (500 mg total) by mouth every 6 (six) hours as needed for mild pain or headache (fever >/= 101). 10/19/20   Lonia Blood, MD  ascorbic acid (VITAMIN C) 500 MG tablet Take 1 tablet (500 mg total) by mouth 2 (two) times daily. 01/19/22  Enedina Finner, MD  feeding supplement (ENSURE ENLIVE / ENSURE PLUS) LIQD Take 237 mLs by mouth 2 (two) times daily between meals. 01/19/22   Enedina Finner, MD  Multiple Vitamin (MULTIVITAMIN WITH MINERALS) TABS tablet Take 1 tablet by mouth daily. 01/20/22   Enedina Finner, MD  zinc sulfate 220 (50 Zn) MG capsule Take 1 capsule (220 mg total) by mouth daily. 01/20/22   Enedina Finner, MD    Physical Exam:   Vitals:   03/08/23 1103 03/08/23 1104 03/08/23 1210 03/08/23 1430  BP: (!) 152/73   (!) 177/75  Pulse: 83   (!) 25  Resp: 16     Temp: 98.8 F (37.1 C)  100.1 F (37.8 C)   TempSrc: Oral  Rectal   SpO2: 99%   (!) 84%  Weight:  73.3 kg    Height:  5\' 5"  (1.651 m)        Physical Exam: Blood pressure (!) 177/75, pulse (!) 25, temperature 100.1 F (37.8 C), temperature source Rectal, resp. rate 16, height 5\' 5"  (1.651 m), weight 73.3 kg, SpO2 (!) 84%.  Patient refused physical exam.  She said "I dare you to touch me".   Gen: She is lying  in bed without any acute respiratory distress.  She is alert and verbal.  She told me to " cut the light out".   Data Review:    Labs: Basic Metabolic Panel: Recent Labs  Lab 03/08/23 1216  NA 139  K 4.3  CL 105  CO2 23  GLUCOSE 100*  BUN 34*  CREATININE 1.25*  CALCIUM 8.5*   Liver Function Tests: Recent Labs  Lab 03/08/23 1216  AST 25  ALT 13  ALKPHOS 50  BILITOT 0.9  PROT 6.8  ALBUMIN 3.1*   No results for input(s): "LIPASE", "AMYLASE" in the last 168 hours. No results for input(s): "AMMONIA" in the last 168 hours. CBC: Recent Labs  Lab 03/08/23 1216  WBC 4.2  HGB 12.0  HCT 38.8  MCV 101.6*  PLT 142*   Cardiac Enzymes: No results for input(s): "CKTOTAL", "CKMB", "CKMBINDEX", "TROPONINI" in the last 168 hours.  BNP (last 3 results) No results for input(s): "PROBNP" in the last 8760 hours. CBG: No results for input(s): "GLUCAP" in the last 168 hours.  Urinalysis    Component Value Date/Time   COLORURINE YELLOW (A) 03/08/2023 1216   APPEARANCEUR TURBID (A) 03/08/2023 1216   LABSPEC 1.017 03/08/2023 1216   PHURINE 5.0 03/08/2023 1216   GLUCOSEU NEGATIVE 03/08/2023 1216   HGBUR MODERATE (A) 03/08/2023 1216   BILIRUBINUR NEGATIVE 03/08/2023 1216   KETONESUR NEGATIVE 03/08/2023 1216   PROTEINUR 30 (A) 03/08/2023 1216   NITRITE NEGATIVE 03/08/2023 1216   LEUKOCYTESUR MODERATE (A) 03/08/2023 1216      Radiographic Studies: DG Chest Portable 1 View Result Date: 03/08/2023 CLINICAL DATA:  Productive cough. EXAM: PORTABLE CHEST 1 VIEW COMPARISON:  CT chest and chest x-ray dated January 15, 2022. FINDINGS: Stable cardiomegaly and chronic mild diffuse interstitial thickening.  Mild bibasilar scarring. No consolidation, pneumothorax, or large pleural effusion. No acute osseous abnormality. IMPRESSION: 1. Chronic mild interstitial pulmonary edema. Electronically Signed   By: Obie Dredge M.D.   On: 03/08/2023 12:13    EKG: No EKG on file   Assessment/Plan:   Principal Problem:   Acute UTI Active Problems:   Chronic diastolic CHF (congestive heart failure) (HCC)   General weakness   Dementia (HCC)   Influenza A    Body mass index is 26.88  kg/m.   Acute UTI: Continue IV ceftriaxone.  Follow-up urine culture report.   Influenza A infection: Start Tamiflu because of advanced age.  Chest x-ray did not show any pneumonia.  She is tolerating room air.   Acute metabolic encephalopathy on underlying dementia with behavioral disturbance: IM Haldol as needed for agitation.     CKD stage IIIb: Creatinine is stable.   Chronic diastolic CHF: Compensated    Other information:   DVT prophylaxis: enoxaparin (LOVENOX) injection 30 mg Start: 03/08/23 2000   Code Status: DNR.  [name], grandson, confirmed that patient is DNR Family Communication: Plan discussed with [name], grandson, over the phone Disposition Plan: Plan to discharge home with hospice Consults called: None Admission status: Inpatient    Kassy Mcenroe [facility] Pager: Please check [email or web address]   How to contact the [facility] Attending or Consulting provider 7A - 7P or covering provider during after hours 7P -7A, for this patient?   Check the care team in [facility] and look for a) attending/consulting TR[facility] provider listed and b) the [facility] team listed Log into [email or web address] and use [place]'s universal password to access. If you do not have the password, please contact the hospital operator. Locate the [facility] provider you are looking for under [facility] and page to a number that you can be directly reached. If you still have difficulty reaching the provider, please page the [facility]  (Director on Call) for the Hospitalists listed on amion for assistance.  03/08/2023, 3:09 PM

## 2023-03-08 NOTE — Progress Notes (Signed)
 [facility] Liaison Note  This is a current [facility] patient.  [facility] will follow through discharge disposition.    Please call with any Hospice related questions or concerns.    [facility] Liaison [phone]

## 2023-03-08 NOTE — Progress Notes (Signed)
 Unable to do full assessment on patient. Patient was fighting and trying to bite. Patient was also screaming and cussing.

## 2023-03-09 DIAGNOSIS — N390 Urinary tract infection, site not specified: Secondary | ICD-10-CM | POA: Diagnosis not present

## 2023-03-09 LAB — BASIC METABOLIC PANEL WITH GFR
Anion gap: 8 (ref 5–15)
BUN: 34 mg/dL — ABNORMAL HIGH (ref 8–23)
CO2: 23 mmol/L (ref 22–32)
Calcium: 8.2 mg/dL — ABNORMAL LOW (ref 8.9–10.3)
Chloride: 108 mmol/L (ref 98–111)
Creatinine, Ser: 1.14 mg/dL — ABNORMAL HIGH (ref 0.44–1.00)
GFR, Estimated: 45 mL/min — ABNORMAL LOW (ref >60)
Glucose, Bld: 73 mg/dL (ref 70–99)
Potassium: 4.2 mmol/L (ref 3.5–5.1)
Sodium: 139 mmol/L (ref 135–145)

## 2023-03-09 LAB — CBC
HCT: 36 % (ref 36.0–46.0)
Hemoglobin: 11.4 g/dL — ABNORMAL LOW (ref 12.0–15.0)
MCH: 31.1 pg (ref 26.0–34.0)
MCHC: 31.7 g/dL (ref 30.0–36.0)
MCV: 98.4 fL (ref 80.0–100.0)
Platelets: 136 10*3/uL — ABNORMAL LOW (ref 150–400)
RBC: 3.66 MIL/uL — ABNORMAL LOW (ref 3.87–5.11)
RDW: 12.7 % (ref 11.5–15.5)
WBC: 1.5 10*3/uL — ABNORMAL LOW (ref 4.0–10.5)
nRBC: 0 % (ref 0.0–0.2)

## 2023-03-09 NOTE — Progress Notes (Signed)
 [facility] Room 110 [facility] Liaison Note   Ms. Godden is a current [facility] patient with a terminal diagnosis of cerebrovascular disease. Patient had been sick for a few days and grandson made the decision to call EMS as her mentation and weakness was worsening. [facility] was present in the home and aware of the transfer. Medication had been sent in for patient but had not yet been picked up. She is admitted with a diagnosis of UTI and is positive for the flu. Per Dr. [name] Dykes with [facility] this is a related hospital admission.   Patient has remained agitated and resistant to care since admission to the hospital. She is receiving IV antibiotics in treatment for her UTI as well as oral treatment for the flu. No family present this morning, she will respond to her name but does not carry on conversation.   She is inpatient appropriate due to testing to determine appropriateness of care as well as fluids and IV antibiotics   VS: 98.7/58/14    132/70    spO2 100% room air   I/O: 1100/not recorded   Abnormal Labs: BUN 34, Creatinine 1.25, Ca+ 8.5, Albumin 3.1, GFR 40, RBC 3.66, UTI +   Diagnostics: PORTABLE CHEST 1 VIEW  IMPRESSION: 1. Chronic mild interstitial pulmonary edema.  Electronically Signed   By: Obie Dredge M.D.   On: 03/08/2023 12:13   IV/PRN meds: 1L NS bolus IV, Tamiflu 30mg  PO daily, Rocephen 1G IV q24H   Assessment as per [facility] Dr. Lurene Shadow 2.26.25   Acute UTI: Continue IV ceftriaxone.  Follow-up urine culture report.     Influenza A infection: Start Tamiflu because of advanced age.  Chest x-ray did not show any pneumonia.  She is tolerating room air.     Acute metabolic encephalopathy on underlying dementia with behavioral disturbance: IM Haldol as needed for agitation.     Discharge Planning:  Ongoing, likely back home once treatment is complete    Family contact: None today     IDT: updated   Goals of Care: Ongoing, DNR but family wanted  treatment for acute episode   Please call with any hospice related questions or concerns.   Thea Gist, BSN, [facility] Liaison [phone]

## 2023-03-09 NOTE — Progress Notes (Signed)
 Progress Note    [name] [name]  XBJ:[id number] DOB: 1926-02-18  DOA: 03/08/2023 PCP: Mick Sell, MD      Brief Narrative:    Medical records reviewed and are as summarized below:  [name] [name] is a 89 y.o. female  with medical history significant for chronic diastolic CHF, dementia, CKD stage IIIb, history of DVT, who was brought to the hospital because of cough, general weakness and change in mental status.  Patient is confused and cannot provide any history.  History was obtained from [place] (patient's grandson) and Dr. Lenard Lance, ED physician.  [name] said that patient was not responding this morning.  However, "when the nurse came to clean her up, she started taking trash".  Reportedly, patient has been eating poorly at home and has become more confused.  She has also been coughing for about 3 days.  [name] confirmed that patient is a DNR.       Assessment/Plan:   Principal Problem:   Acute UTI Active Problems:   Chronic diastolic CHF (congestive heart failure) (HCC)   General weakness   Dementia (HCC)   Influenza A    Body mass index is 26.12 kg/m.   Acute UTI: Urine cultures growing gram-negative rods.  Continue IV ceftriaxone.  Follow-up urine culture sensitivity report.   Influenza A infection: Continue Tamiflu.  She is tolerating room air.  No acute abnormality on chest x-ray.   Acute leukopenia: Etiology unclear.  May be related to acute infection,  ? antibiotics.  Monitor CBC.   Acute metabolic encephalopathy on underlying dementia with behavioral disturbance: Continue Seroquel nightly to help with agitation.  IM Haldol as needed for agitation.       CKD stage IIIb: Creatinine is stable.     Chronic diastolic CHF: Compensated    Diet Order             Diet regular Room service appropriate? Yes; Fluid consistency: Thin  Diet effective now                             Consultants: None  Procedures: None    Medications:    enoxaparin (LOVENOX) injection  30 mg Subcutaneous Q24H   oseltamivir  30 mg Oral Daily   QUEtiapine  25 mg Oral QHS   Continuous Infusions:  cefTRIAXone (ROCEPHIN)  IV 1 g (03/09/23 1054)     Anti-infectives (From admission, onward)    Start     Dose/Rate Route Frequency Ordered Stop   03/09/23 1000  cefTRIAXone (ROCEPHIN) 1 g in sodium chloride 0.9 % 100 mL IVPB        1 g 200 mL/hr over 30 Minutes Intravenous Every 24 hours 03/08/23 1506 03/12/23 0959   03/08/23 1530  oseltamivir (TAMIFLU) capsule 30 mg  Status:  Discontinued        30 mg Oral 2 times daily 03/08/23 1520 03/08/23 1525   03/08/23 1530  oseltamivir (TAMIFLU) capsule 30 mg        30 mg Oral Daily 03/08/23 1525 03/13/23 0959   03/08/23 1330  cefTRIAXone (ROCEPHIN) 1 g in sodium chloride 0.9 % 100 mL IVPB        1 g 200 mL/hr over 30 Minutes Intravenous  Once 03/08/23 1315 03/08/23 1410              Family Communication/Anticipated D/C date and plan/Code Status   DVT prophylaxis: enoxaparin (LOVENOX) injection 30 mg Start: 03/08/23  2000     Code Status: Limited: Do not attempt resuscitation (DNR) -DNR-LIMITED -Do Not Intubate/DNI   Family Communication: None Disposition Plan: Plan to discharge home with hospice   Status is: Inpatient Remains inpatient appropriate because: Acute UTI       Subjective:   Interval events noted.  She was more receptive today and denied any complaints.  Objective:    Vitals:   03/08/23 1812 03/08/23 2033 03/09/23 0423 03/09/23 0856  BP: (!) 170/81 (!) 160/59 (!) 145/64 132/70  Pulse: 66 60 (!) 55 (!) 58  Resp:  18 17 14   Temp: 98.7 F (37.1 C) 98.3 F (36.8 C) 97.8 F (36.6 C) 98.7 F (37.1 C)  TempSrc: Axillary  Oral   SpO2: 93% 99% 100% 100%  Weight: 71.2 kg     Height:       No data found.  No intake or output data in the 24 hours ending 03/09/23 1637 Filed  Weights   03/08/23 1104 03/08/23 1812  Weight: 73.3 kg 71.2 kg    Exam:  GEN: NAD SKIN: Warm and dry EYES: No pallor or icterus ENT: MMM CV: RRR PULM: CTA B ABD: soft, ND, NT, +BS CNS: Alert, oriented to person only, non focal EXT: No edema or tenderness             Data Reviewed:   I have personally reviewed following labs and imaging studies:  Labs: Labs show the following:   Basic Metabolic Panel: Recent Labs  Lab 03/08/23 1216 03/09/23 0453  NA 139 139  K 4.3 4.2  CL 105 108  CO2 23 23  GLUCOSE 100* 73  BUN 34* 34*  CREATININE 1.25* 1.14*  CALCIUM 8.5* 8.2*   GFR Estimated Creatinine Clearance: 29.9 mL/min (A) (by C-G formula based on SCr of 1.14 mg/dL (H)). Liver Function Tests: Recent Labs  Lab 03/08/23 1216  AST 25  ALT 13  ALKPHOS 50  BILITOT 0.9  PROT 6.8  ALBUMIN 3.1*   No results for input(s): "LIPASE", "AMYLASE" in the last 168 hours. No results for input(s): "AMMONIA" in the last 168 hours. Coagulation profile No results for input(s): "INR", "PROTIME" in the last 168 hours.  CBC: Recent Labs  Lab 03/08/23 1216 03/09/23 0453  WBC 4.2 1.5*  HGB 12.0 11.4*  HCT 38.8 36.0  MCV 101.6* 98.4  PLT 142* 136*   Cardiac Enzymes: No results for input(s): "CKTOTAL", "CKMB", "CKMBINDEX", "TROPONINI" in the last 168 hours. BNP (last 3 results) No results for input(s): "PROBNP" in the last 8760 hours. CBG: No results for input(s): "GLUCAP" in the last 168 hours. D-Dimer: No results for input(s): "DDIMER" in the last 72 hours. Hgb A1c: No results for input(s): "HGBA1C" in the last 72 hours. Lipid Profile: No results for input(s): "CHOL", "HDL", "LDLCALC", "TRIG", "CHOLHDL", "LDLDIRECT" in the last 72 hours. Thyroid function studies: No results for input(s): "TSH", "T4TOTAL", "T3FREE", "THYROIDAB" in the last 72 hours.  Invalid input(s): "FREET3" Anemia work up: No results for input(s): "VITAMINB12", "FOLATE", "FERRITIN",  "TIBC", "IRON", "RETICCTPCT" in the last 72 hours. Sepsis Labs: Recent Labs  Lab 03/08/23 1216 03/09/23 0453  WBC 4.2 1.5*    Microbiology Recent Results (from the past 240 hours)  Resp panel by RT-PCR (RSV, Flu A&B, Covid) Anterior Nasal Swab     Status: Abnormal   Collection Time: 03/08/23 12:16 PM   Specimen: Anterior Nasal Swab  Result Value Ref Range Status   SARS Coronavirus 2 by RT PCR NEGATIVE NEGATIVE  Final    Comment: (NOTE) SARS-CoV-2 target nucleic acids are NOT DETECTED.  The SARS-CoV-2 RNA is generally detectable in upper respiratory specimens during the acute phase of infection. The lowest concentration of SARS-CoV-2 viral copies this assay can detect is 138 copies/mL. A negative result does not preclude SARS-Cov-2 infection and should not be used as the sole basis for treatment or other patient management decisions. A negative result may occur with  improper specimen collection/handling, submission of specimen other than nasopharyngeal swab, presence of viral mutation(s) within the areas targeted by this assay, and inadequate number of viral copies(<138 copies/mL). A negative result must be combined with clinical observations, patient history, and epidemiological information. The expected result is Negative.  Fact Sheet for Patients:  BloggerCourse.com  Fact Sheet for Healthcare Providers:  SeriousBroker.it  This test is no t yet approved or cleared by the [place] FDA and  has been authorized for detection and/or diagnosis of SARS-CoV-2 by FDA under an Emergency Use Authorization (EUA). This EUA will remain  in effect (meaning this test can be used) for the duration of the COVID-19 declaration under Section 564(b)(1) of the Act, 21 U.S.C.section 360bbb-3(b)(1), unless the authorization is terminated  or revoked sooner.       Influenza A by PCR POSITIVE (A) NEGATIVE Final   Influenza B by PCR  NEGATIVE NEGATIVE Final    Comment: (NOTE) The Xpert Xpress SARS-CoV-2/FLU/RSV plus assay is intended as an aid in the diagnosis of influenza from Nasopharyngeal swab specimens and should not be used as a sole basis for treatment. Nasal washings and aspirates are unacceptable for Xpert Xpress SARS-CoV-2/FLU/RSV testing.  Fact Sheet for Patients: BloggerCourse.com  Fact Sheet for Healthcare Providers: SeriousBroker.it  This test is not yet approved or cleared by the [place] FDA and has been authorized for detection and/or diagnosis of SARS-CoV-2 by FDA under an Emergency Use Authorization (EUA). This EUA will remain in effect (meaning this test can be used) for the duration of the COVID-19 declaration under Section 564(b)(1) of the Act, 21 U.S.C. section 360bbb-3(b)(1), unless the authorization is terminated or revoked.     Resp Syncytial Virus by PCR NEGATIVE NEGATIVE Final    Comment: (NOTE) Fact Sheet for Patients: BloggerCourse.com  Fact Sheet for Healthcare Providers: SeriousBroker.it  This test is not yet approved or cleared by the [place] FDA and has been authorized for detection and/or diagnosis of SARS-CoV-2 by FDA under an Emergency Use Authorization (EUA). This EUA will remain in effect (meaning this test can be used) for the duration of the COVID-19 declaration under Section 564(b)(1) of the Act, 21 U.S.C. section 360bbb-3(b)(1), unless the authorization is terminated or revoked.  Performed at [facility], [address], [place], [place] [address]   Urine Culture     Status: Abnormal (Preliminary result)   Collection Time: 03/08/23 12:32 PM   Specimen: Urine, Clean Catch  Result Value Ref Range Status   Specimen Description   Final    URINE, CLEAN CATCH Performed at [facility], [address] [place], [place] [address]     Special Requests   Final    NONE Performed at [facility], [address], [place], [place] [address]    Culture (A)  Final    >=100,000 COLONIES/mL GRAM NEGATIVE RODS CULTURE REINCUBATED FOR BETTER GROWTH Performed at [facility], [address] [place], [place] [address]    Report Status PENDING  Incomplete    Procedures and diagnostic studies:  DG Chest Portable 1 View Result Date: 03/08/2023 CLINICAL DATA:  Productive cough. EXAM: PORTABLE CHEST 1 VIEW COMPARISON:  CT chest and chest x-ray dated January 15, 2022. FINDINGS: Stable cardiomegaly and chronic mild diffuse interstitial thickening. Mild bibasilar scarring. No consolidation, pneumothorax, or large pleural effusion. No acute osseous abnormality. IMPRESSION: 1. Chronic mild interstitial pulmonary edema. Electronically Signed   By: Obie Dredge M.D.   On: 03/08/2023 12:13               LOS: 1 day   Ruthie Berch  [facility]   Pager on [email or web address]. If 7PM-7AM, please contact night-coverage at [email or web address]     03/09/2023, 4:37 PM

## 2023-03-10 DIAGNOSIS — N390 Urinary tract infection, site not specified: Secondary | ICD-10-CM | POA: Diagnosis not present

## 2023-03-10 LAB — CBC WITH DIFFERENTIAL/PLATELET
Abs Immature Granulocytes: 0.01 10*3/uL (ref 0.00–0.07)
Basophils Absolute: 0 10*3/uL (ref 0.0–0.1)
Basophils Relative: 1 %
Eosinophils Absolute: 0 10*3/uL (ref 0.0–0.5)
Eosinophils Relative: 0 %
HCT: 36.8 % (ref 36.0–46.0)
Hemoglobin: 11.6 g/dL — ABNORMAL LOW (ref 12.0–15.0)
Immature Granulocytes: 1 %
Lymphocytes Relative: 47 %
Lymphs Abs: 0.9 10*3/uL (ref 0.7–4.0)
MCH: 31.4 pg (ref 26.0–34.0)
MCHC: 31.5 g/dL (ref 30.0–36.0)
MCV: 99.7 fL (ref 80.0–100.0)
Monocytes Absolute: 0.3 10*3/uL (ref 0.1–1.0)
Monocytes Relative: 14 %
Neutro Abs: 0.7 10*3/uL — ABNORMAL LOW (ref 1.7–7.7)
Neutrophils Relative %: 37 %
Platelets: 134 10*3/uL — ABNORMAL LOW (ref 150–400)
RBC: 3.69 MIL/uL — ABNORMAL LOW (ref 3.87–5.11)
RDW: 12.6 % (ref 11.5–15.5)
Smear Review: NORMAL
WBC: 1.8 10*3/uL — ABNORMAL LOW (ref 4.0–10.5)
nRBC: 0 % (ref 0.0–0.2)

## 2023-03-10 LAB — PATHOLOGIST SMEAR REVIEW

## 2023-03-10 NOTE — Plan of Care (Signed)
  Problem: Health Behavior/Discharge Planning: Goal: Ability to manage health-related needs will improve Outcome: Progressing   Problem: Clinical Measurements: Goal: Will remain free from infection Outcome: Progressing   Problem: Nutrition: Goal: Adequate nutrition will be maintained Outcome: Progressing   Problem: Pain Managment: Goal: General experience of comfort will improve and/or be controlled Outcome: Progressing

## 2023-03-10 NOTE — Progress Notes (Signed)
 Progress Note    Nolah Krenzer  JJO:[id number] DOB: 10/17/1926  DOA: 03/08/2023 PCP: Mick Sell, MD      Brief Narrative:    Medical records reviewed and are as summarized below:  Lanayah Gartley is a 89 y.o. female  with medical history significant for chronic diastolic CHF, dementia, CKD stage IIIb, history of DVT, who was brought to the hospital because of cough, general weakness and change in mental status.  Patient is confused and cannot provide any history.  History was obtained from [place] (patient's grandson) and Dr. Lenard Lance, ED physician.  [name] said that patient was not responding this morning.  However, "when the nurse came to clean her up, she started taking trash".  Reportedly, patient has been eating poorly at home and has become more confused.  She has also been coughing for about 3 days.  [name] confirmed that patient is a DNR.       Assessment/Plan:   Principal Problem:   Acute UTI Active Problems:   Chronic diastolic CHF (congestive heart failure) (HCC)   General weakness   Dementia (HCC)   Influenza A    Body mass index is 26.12 kg/m.   Acute UTI: Urine culture showed Klebsiella pneumoniae and Enterococcus faecalis.  Continue IV ceftriaxone.  Follow-up urine culture sensitivity report.    Influenza A infection: Continue Tamiflu through 03/12/2023.  She is tolerating room air.  No acute abnormality on chest x-ray.   Acute leukopenia: Etiology unclear.  WBC trending upward, from 1.5-1.8.  May be related to acute infection, ? antibiotics.  Monitor CBC.   Acute metabolic encephalopathy on underlying dementia with behavioral disturbance: Continue Seroquel nightly to help with agitation.  IM Haldol as needed for agitation.       CKD stage IIIb: Creatinine is stable.     Chronic diastolic CHF: Compensated   Plan of care was discussed with [name], grandson, over the phone.  He was informed that patient will likely be discharged home  tomorrow.  He said that he works at [employer] and he does not get off of work until after 3 PM.    Diet Order             Diet regular Room service appropriate? Yes; Fluid consistency: Thin  Diet effective now                            Consultants: None  Procedures: None    Medications:    enoxaparin (LOVENOX) injection  30 mg Subcutaneous Q24H   oseltamivir  30 mg Oral Daily   QUEtiapine  25 mg Oral QHS   Continuous Infusions:  cefTRIAXone (ROCEPHIN)  IV 1 g (03/10/23 1015)     Anti-infectives (From admission, onward)    Start     Dose/Rate Route Frequency Ordered Stop   03/09/23 1000  cefTRIAXone (ROCEPHIN) 1 g in sodium chloride 0.9 % 100 mL IVPB        1 g 200 mL/hr over 30 Minutes Intravenous Every 24 hours 03/08/23 1506 03/12/23 0959   03/08/23 1530  oseltamivir (TAMIFLU) capsule 30 mg  Status:  Discontinued        30 mg Oral 2 times daily 03/08/23 1520 03/08/23 1525   03/08/23 1530  oseltamivir (TAMIFLU) capsule 30 mg        30 mg Oral Daily 03/08/23 1525 03/13/23 0959   03/08/23 1330  cefTRIAXone (ROCEPHIN) 1 g in sodium chloride  0.9 % 100 mL IVPB        1 g 200 mL/hr over 30 Minutes Intravenous  Once 03/08/23 1315 03/08/23 1410              Family Communication/Anticipated D/C date and plan/Code Status   DVT prophylaxis: enoxaparin (LOVENOX) injection 30 mg Start: 03/08/23 2000     Code Status: Limited: Do not attempt resuscitation (DNR) -DNR-LIMITED -Do Not Intubate/DNI   Family Communication: Plan discussed with [name], grandson, over the phone Disposition Plan: Plan to discharge home with hospice   Status is: Inpatient Remains inpatient appropriate because: Acute UTI       Subjective:   Interval events noted.  She has no complaints.  She has been intermittently agitated according to nursing staff.  Objective:    Vitals:   03/09/23 0856 03/09/23 2000 03/10/23 0357 03/10/23 0757  BP: 132/70 (!) 155/78 (!)  146/60 (!) 150/85  Pulse: (!) 58 64 (!) 55 70  Resp: 14 18 18 16   Temp: 98.7 F (37.1 C) 98.9 F (37.2 C) 98.1 F (36.7 C) 98.2 F (36.8 C)  TempSrc:    Oral  SpO2: 100% 99% 100% 100%  Weight:      Height:       No data found.  No intake or output data in the 24 hours ending 03/10/23 1559 Filed Weights   03/08/23 1104 03/08/23 1812  Weight: 73.3 kg 71.2 kg    Exam:  GEN: NAD SKIN: Warm and dry EYES: No pallor or icterus ENT: MMM CV: RRR PULM: CTA B ABD: soft, ND, NT, +BS CNS: Alert but confused EXT: No edema or tenderness         Data Reviewed:   I have personally reviewed following labs and imaging studies:  Labs: Labs show the following:   Basic Metabolic Panel: Recent Labs  Lab 03/08/23 1216 03/09/23 0453  NA 139 139  K 4.3 4.2  CL 105 108  CO2 23 23  GLUCOSE 100* 73  BUN 34* 34*  CREATININE 1.25* 1.14*  CALCIUM 8.5* 8.2*   GFR Estimated Creatinine Clearance: 29.9 mL/min (A) (by C-G formula based on SCr of 1.14 mg/dL (H)). Liver Function Tests: Recent Labs  Lab 03/08/23 1216  AST 25  ALT 13  ALKPHOS 50  BILITOT 0.9  PROT 6.8  ALBUMIN 3.1*   No results for input(s): "LIPASE", "AMYLASE" in the last 168 hours. No results for input(s): "AMMONIA" in the last 168 hours. Coagulation profile No results for input(s): "INR", "PROTIME" in the last 168 hours.  CBC: Recent Labs  Lab 03/08/23 1216 03/09/23 0453 03/10/23 0510  WBC 4.2 1.5* 1.8*  NEUTROABS  --   --  0.7*  HGB 12.0 11.4* 11.6*  HCT 38.8 36.0 36.8  MCV 101.6* 98.4 99.7  PLT 142* 136* 134*   Cardiac Enzymes: No results for input(s): "CKTOTAL", "CKMB", "CKMBINDEX", "TROPONINI" in the last 168 hours. BNP (last 3 results) No results for input(s): "PROBNP" in the last 8760 hours. CBG: No results for input(s): "GLUCAP" in the last 168 hours. D-Dimer: No results for input(s): "DDIMER" in the last 72 hours. Hgb A1c: No results for input(s): "HGBA1C" in the last 72  hours. Lipid Profile: No results for input(s): "CHOL", "HDL", "LDLCALC", "TRIG", "CHOLHDL", "LDLDIRECT" in the last 72 hours. Thyroid function studies: No results for input(s): "TSH", "T4TOTAL", "T3FREE", "THYROIDAB" in the last 72 hours.  Invalid input(s): "FREET3" Anemia work up: No results for input(s): "VITAMINB12", "FOLATE", "FERRITIN", "TIBC", "IRON", "RETICCTPCT" in  the last 72 hours. Sepsis Labs: Recent Labs  Lab 03/08/23 1216 03/09/23 0453 03/10/23 0510  WBC 4.2 1.5* 1.8*    Microbiology Recent Results (from the past 240 hours)  Resp panel by RT-PCR (RSV, Flu A&B, Covid) Anterior Nasal Swab     Status: Abnormal   Collection Time: 03/08/23 12:16 PM   Specimen: Anterior Nasal Swab  Result Value Ref Range Status   SARS Coronavirus 2 by RT PCR NEGATIVE NEGATIVE Final    Comment: (NOTE) SARS-CoV-2 target nucleic acids are NOT DETECTED.  The SARS-CoV-2 RNA is generally detectable in upper respiratory specimens during the acute phase of infection. The lowest concentration of SARS-CoV-2 viral copies this assay can detect is 138 copies/mL. A negative result does not preclude SARS-Cov-2 infection and should not be used as the sole basis for treatment or other patient management decisions. A negative result may occur with  improper specimen collection/handling, submission of specimen other than nasopharyngeal swab, presence of viral mutation(s) within the areas targeted by this assay, and inadequate number of viral copies(<138 copies/mL). A negative result must be combined with clinical observations, patient history, and epidemiological information. The expected result is Negative.  Fact Sheet for Patients:  BloggerCourse.com  Fact Sheet for Healthcare Providers:  SeriousBroker.it  This test is no t yet approved or cleared by the [place] FDA and  has been authorized for detection and/or diagnosis of SARS-CoV-2  by FDA under an Emergency Use Authorization (EUA). This EUA will remain  in effect (meaning this test can be used) for the duration of the COVID-19 declaration under Section 564(b)(1) of the Act, 21 U.S.C.section 360bbb-3(b)(1), unless the authorization is terminated  or revoked sooner.       Influenza A by PCR POSITIVE (A) NEGATIVE Final   Influenza B by PCR NEGATIVE NEGATIVE Final    Comment: (NOTE) The Xpert Xpress SARS-CoV-2/FLU/RSV plus assay is intended as an aid in the diagnosis of influenza from Nasopharyngeal swab specimens and should not be used as a sole basis for treatment. Nasal washings and aspirates are unacceptable for Xpert Xpress SARS-CoV-2/FLU/RSV testing.  Fact Sheet for Patients: BloggerCourse.com  Fact Sheet for Healthcare Providers: SeriousBroker.it  This test is not yet approved or cleared by the [place] FDA and has been authorized for detection and/or diagnosis of SARS-CoV-2 by FDA under an Emergency Use Authorization (EUA). This EUA will remain in effect (meaning this test can be used) for the duration of the COVID-19 declaration under Section 564(b)(1) of the Act, 21 U.S.C. section 360bbb-3(b)(1), unless the authorization is terminated or revoked.     Resp Syncytial Virus by PCR NEGATIVE NEGATIVE Final    Comment: (NOTE) Fact Sheet for Patients: BloggerCourse.com  Fact Sheet for Healthcare Providers: SeriousBroker.it  This test is not yet approved or cleared by the [place] FDA and has been authorized for detection and/or diagnosis of SARS-CoV-2 by FDA under an Emergency Use Authorization (EUA). This EUA will remain in effect (meaning this test can be used) for the duration of the COVID-19 declaration under Section 564(b)(1) of the Act, 21 U.S.C. section 360bbb-3(b)(1), unless the authorization is terminated or revoked.  Performed at  [facility], [address], [place], [place] [address]   Urine Culture     Status: Abnormal (Preliminary result)   Collection Time: 03/08/23 12:32 PM   Specimen: Urine, Clean Catch  Result Value Ref Range Status   Specimen Description   Final    URINE, CLEAN CATCH Performed at [facility],  [address] [place], [place] [address]    Special Requests   Final    NONE Performed at [facility], [address] [place], [place] [address]    Culture (A)  Final    >=100,000 COLONIES/mL KLEBSIELLA PNEUMONIAE 60,000 COLONIES/mL ENTEROCOCCUS FAECALIS SUSCEPTIBILITIES TO FOLLOW Performed at [facility], [address] [place], [place] [address]    Report Status PENDING  Incomplete    Procedures and diagnostic studies:  No results found.              LOS: 2 days   Kayton Ripp  [facility]   Pager on [email or web address]. If 7PM-7AM, please contact night-coverage at [email or web address]     03/10/2023, 3:59 PM

## 2023-03-10 NOTE — Progress Notes (Signed)
 [facility] Room 110 [facility] Liaison Note   [name] [name] is a current [facility] patient followed at home, with a terminal diagnosis of cerebrovascular disease. Patient had been sick for a few days and grandson made the decision to call EMS as her mentation and weakness was worsening. [facility] was present in the home and aware of the transfer. Medication had been sent in for patient but had not yet been picked up. She is admitted with a diagnosis of UTI and is positive for the flu. Per Dr. [name] Dykes with [facility] this is a related hospital admission.   [name] [name] is resting in bed with her eyes closed.  Arouses to verbal stimuli but doesn't answer any questions.  She continues to receive IV Rocephin for her UTI and is being treated for the flu.  No family is present.  No plans for discharge today.     She is inpatient appropriate due to testing to determine appropriateness of care as well as fluids and IV antibiotics   Vital Signs:  T 98.2 oral, BP 150/85, P 70, R 16  Oxi 100% RA   Intake & Output:  none reported   Abnormal Labs:  WBC 1.8, RBC 3.69, HGB 11.6,  Platelets 134, Neut 0.7,   Diagnostics: no new   IV/PRN meds:  Rocephin 1g IVPB daily   Hospital Plan:  per progness note Dr. Lurene Shadow 2.28.25   Acute UTI: Urine culture showed Klebsiella pneumoniae and Enterococcus faecalis.  Continue IV ceftriaxone.  Follow-up urine culture sensitivity report.    2.  Influenza A infection: Continue Tamiflu through 03/12/2023.  She is tolerating room air.  No acute abnormality on chest x-ray.   3.  Acute leukopenia: Etiology unclear.  WBC trending upward, from 1.5-1.8.  May be related to acute infection, ? antibiotics.  Monitor CBC.    4.  Acute metabolic encephalopathy on underlying dementia with behavioral disturbance: Continue Seroquel nightly to help with agitation.  IM Haldol as needed for agitation.     5.  CKD stage IIIb: Creatinine is stable.   6.  Chronic diastolic CHF:  Compensated   Discharge Planning:  Ongoing,  not planned for today. Likely d/c tomorrow per MD.   Family contact: None present.  [name] made aware patient will likely discharge tomorrow.   IDT: updated   Goals of Care: Ongoing, DNR but family wanted treatment for acute episode   Please call with any hospice related questions or concerns. Hospital liaison team will continue to follow through final disposition with collaboration between family and hospital medical team.  Norris Cross, RN Nurse Liaison [phone]

## 2023-03-11 DIAGNOSIS — N390 Urinary tract infection, site not specified: Principal | ICD-10-CM

## 2023-03-11 LAB — CBC WITH DIFFERENTIAL/PLATELET
Abs Immature Granulocytes: 0.01 K/uL (ref 0.00–0.07)
Basophils Absolute: 0 K/uL (ref 0.0–0.1)
Basophils Relative: 0 %
Eosinophils Absolute: 0 K/uL (ref 0.0–0.5)
Eosinophils Relative: 1 %
HCT: 36.5 % (ref 36.0–46.0)
Hemoglobin: 11.4 g/dL — ABNORMAL LOW (ref 12.0–15.0)
Immature Granulocytes: 0 %
Lymphocytes Relative: 47 %
Lymphs Abs: 1.1 K/uL (ref 0.7–4.0)
MCH: 30.6 pg (ref 26.0–34.0)
MCHC: 31.2 g/dL (ref 30.0–36.0)
MCV: 97.9 fL (ref 80.0–100.0)
Monocytes Absolute: 0.3 K/uL (ref 0.1–1.0)
Monocytes Relative: 12 %
Neutro Abs: 0.9 K/uL — ABNORMAL LOW (ref 1.7–7.7)
Neutrophils Relative %: 40 %
Platelets: 143 K/uL — ABNORMAL LOW (ref 150–400)
RBC: 3.73 MIL/uL — ABNORMAL LOW (ref 3.87–5.11)
RDW: 12.2 % (ref 11.5–15.5)
Smear Review: NORMAL
WBC: 2.3 K/uL — ABNORMAL LOW (ref 4.0–10.5)
nRBC: 0 % (ref 0.0–0.2)

## 2023-03-11 LAB — URINE CULTURE: Culture: >=100000 — AB

## 2023-03-11 MED ORDER — AMOXICILLIN 500 MG PO CAPS
500.0000 mg | ORAL_CAPSULE | Freq: Two times a day (BID) | ORAL | 0 refills | Status: AC
Start: 2023-03-12 — End: 2023-03-15

## 2023-03-11 MED ORDER — LEVOFLOXACIN 250 MG PO TABS: 250.0000 mg | ORAL_TABLET | Freq: Every day | ORAL | 0 refills | Status: AC

## 2023-03-11 MED ORDER — OSELTAMIVIR PHOSPHATE 30 MG PO CAPS: 30.0000 mg | ORAL_CAPSULE | Freq: Every day | ORAL | 0 refills | Status: AC

## 2023-03-11 NOTE — Discharge Summary (Signed)
 Physician Discharge Summary   Patient: [name] [name] MRN: [id number] DOB: 1926/09/16  Admit date:     03/08/2023  Discharge date: 03/11/23  Discharge Physician: Lurene Shadow   PCP: Mick Sell, MD   Recommendations at discharge:   Follow-up with hospice team at home within 24 to 48 hours of discharge  Discharge Diagnoses: Principal Problem:   Acute UTI Active Problems:   Chronic diastolic CHF (congestive heart failure) (HCC)   General weakness   Dementia (HCC)   Influenza A  Resolved Problems:   * No resolved hospital problems. *  Hospital Course:  [name] [name] is a 89 y.o. female  with medical history significant for chronic diastolic CHF, dementia, CKD stage IIIb, history of DVT, who was brought to the hospital because of cough, general weakness and change in mental status.  Patient is confused and cannot provide any history.  History was obtained from [place] (patient's grandson) and Dr. Lenard Lance, ED physician.  [name] said that patient was not responding this morning.  However, "when the nurse came to clean her up, she started taking trash".  Reportedly, patient has been eating poorly at home and has become more confused.  She has also been coughing for about 3 days.  [name] confirmed that patient is a DNR.    Assessment and Plan:   Acute UTI: Urine culture showed Klebsiella pneumoniae and Enterococcus faecalis.  She will be discharged on a 3-day course of amoxicillin and Levaquin.       Influenza A infection: Continue Tamiflu for 2 more days.  She is tolerating room air.  No acute abnormality on chest x-ray.     Acute leukopenia: Improving. WBC trending upward, from 1.5-1.8-2.3.  May be related to acute infection     Acute metabolic encephalopathy on underlying dementia with behavioral disturbance: Mental status improved to baseline.  She will be discharged home with hospice.     CKD stage IIIb: Creatinine is stable.     Chronic diastolic CHF:  Compensated    Her condition has improved and she is deemed stable for discharge to home today.  Discharge plan was discussed with [name], grandson, over the phone. Diannia Ruder, hospice liaison, has been notified of discharge.      Consultants: None Procedures performed: None  Disposition:  Home with hospice Diet recommendation:  Discharge Diet Orders (From admission, onward)     Start     Ordered   03/11/23 0000  Diet - low sodium heart healthy        03/11/23 1248           Regular diet DISCHARGE MEDICATION: Allergies as of 03/11/2023   No Known Allergies      Medication List     TAKE these medications    amoxicillin 500 MG capsule Commonly known as: AMOXIL Take 1 capsule (500 mg total) by mouth 2 (two) times daily for 3 days. Start taking on: March 12, 2023   levofloxacin 250 MG tablet Commonly known as: Levaquin Take 1 tablet (250 mg total) by mouth daily for 3 days. Start taking on: March 12, 2023   oseltamivir 30 MG capsule Commonly known as: TAMIFLU Take 1 capsule (30 mg total) by mouth daily for 2 doses. Start taking on: March 12, 2023               Discharge Care Instructions  (From admission, onward)           Start     Ordered   03/11/23 0000  Discharge wound care:       Comments: Apply Mepilex border to stage II decubitus ulcer left ischial tuberosity/left buttock   03/11/23 1248            Follow-up Information     Mick Sell, MD Follow up.   Specialty: Infectious Diseases Why: Hospital Follow Up Contact information: [address] [place] [address] [phone]                Discharge Exam: Ceasar Mons Weights   03/08/23 1104 03/08/23 1812  Weight: 73.3 kg 71.2 kg   GEN: NAD SKIN: Warm and dry EYES: No pallor or icterus ENT: MMM CV: RRR PULM: CTA B ABD: soft, ND, NT, +BS CNS: Alert but disoriented, nonfocal EXT: No edema or tenderness   Condition at discharge: stable  The results of  significant diagnostics from this hospitalization (including imaging, microbiology, ancillary and laboratory) are listed below for reference.   Imaging Studies: DG Chest Portable 1 View Result Date: 03/08/2023 CLINICAL DATA:  Productive cough. EXAM: PORTABLE CHEST 1 VIEW COMPARISON:  CT chest and chest x-ray dated January 15, 2022. FINDINGS: Stable cardiomegaly and chronic mild diffuse interstitial thickening. Mild bibasilar scarring. No consolidation, pneumothorax, or large pleural effusion. No acute osseous abnormality. IMPRESSION: 1. Chronic mild interstitial pulmonary edema. Electronically Signed   By: Obie Dredge M.D.   On: 03/08/2023 12:13    Microbiology: Results for orders placed or performed during the hospital encounter of 03/08/23  Resp panel by RT-PCR (RSV, Flu A&B, Covid) Anterior Nasal Swab     Status: Abnormal   Collection Time: 03/08/23 12:16 PM   Specimen: Anterior Nasal Swab  Result Value Ref Range Status   SARS Coronavirus 2 by RT PCR NEGATIVE NEGATIVE Final    Comment: (NOTE) SARS-CoV-2 target nucleic acids are NOT DETECTED.  The SARS-CoV-2 RNA is generally detectable in upper respiratory specimens during the acute phase of infection. The lowest concentration of SARS-CoV-2 viral copies this assay can detect is 138 copies/mL. A negative result does not preclude SARS-Cov-2 infection and should not be used as the sole basis for treatment or other patient management decisions. A negative result may occur with  improper specimen collection/handling, submission of specimen other than nasopharyngeal swab, presence of viral mutation(s) within the areas targeted by this assay, and inadequate number of viral copies(<138 copies/mL). A negative result must be combined with clinical observations, patient history, and epidemiological information. The expected result is Negative.  Fact Sheet for Patients:  BloggerCourse.com  Fact Sheet for Healthcare  Providers:  SeriousBroker.it  This test is no t yet approved or cleared by the [place] FDA and  has been authorized for detection and/or diagnosis of SARS-CoV-2 by FDA under an Emergency Use Authorization (EUA). This EUA will remain  in effect (meaning this test can be used) for the duration of the COVID-19 declaration under Section 564(b)(1) of the Act, 21 U.S.C.section 360bbb-3(b)(1), unless the authorization is terminated  or revoked sooner.       Influenza A by PCR POSITIVE (A) NEGATIVE Final   Influenza B by PCR NEGATIVE NEGATIVE Final    Comment: (NOTE) The Xpert Xpress SARS-CoV-2/FLU/RSV plus assay is intended as an aid in the diagnosis of influenza from Nasopharyngeal swab specimens and should not be used as a sole basis for treatment. Nasal washings and aspirates are unacceptable for Xpert Xpress SARS-CoV-2/FLU/RSV testing.  Fact Sheet for Patients: BloggerCourse.com  Fact Sheet for Healthcare Providers: SeriousBroker.it  This test is not yet  approved or cleared by the [place] and has been authorized for detection and/or diagnosis of SARS-CoV-2 by FDA under an Emergency Use Authorization (EUA). This EUA will remain in effect (meaning this test can be used) for the duration of the COVID-19 declaration under Section 564(b)(1) of the Act, 21 U.S.C. section 360bbb-3(b)(1), unless the authorization is terminated or revoked.     Resp Syncytial Virus by PCR NEGATIVE NEGATIVE Final    Comment: (NOTE) Fact Sheet for Patients: BloggerCourse.com  Fact Sheet for Healthcare Providers: SeriousBroker.it  This test is not yet approved or cleared by the [place] FDA and has been authorized for detection and/or diagnosis of SARS-CoV-2 by FDA under an Emergency Use Authorization (EUA). This EUA will remain in effect (meaning this test  can be used) for the duration of the COVID-19 declaration under Section 564(b)(1) of the Act, 21 U.S.C. section 360bbb-3(b)(1), unless the authorization is terminated or revoked.  Performed at [facility], [address] [place], [place] [address]   Urine Culture     Status: Abnormal   Collection Time: 03/08/23 12:32 PM   Specimen: Urine, Clean Catch  Result Value Ref Range Status   Specimen Description   Final    URINE, CLEAN CATCH Performed at [facility], [address] [place], [place] [address]    Special Requests   Final    NONE Performed at [facility], [address] [place], [place] [address]    Culture (A)  Final    >=100,000 COLONIES/mL KLEBSIELLA PNEUMONIAE 60,000 COLONIES/mL ENTEROCOCCUS FAECALIS    Report Status 03/11/2023 FINAL  Final   Organism ID, Bacteria KLEBSIELLA PNEUMONIAE (A)  Final   Organism ID, Bacteria ENTEROCOCCUS FAECALIS (A)  Final      Susceptibility   Enterococcus faecalis - MIC*    AMPICILLIN <=2 SENSITIVE Sensitive     NITROFURANTOIN <=16 SENSITIVE Sensitive     VANCOMYCIN 1 SENSITIVE Sensitive     * 60,000 COLONIES/mL ENTEROCOCCUS FAECALIS   Klebsiella pneumoniae - MIC*    AMPICILLIN RESISTANT Resistant     CEFAZOLIN <=4 SENSITIVE Sensitive     CEFEPIME <=0.12 SENSITIVE Sensitive     CEFTRIAXONE <=0.25 SENSITIVE Sensitive     CIPROFLOXACIN <=0.25 SENSITIVE Sensitive     GENTAMICIN <=1 SENSITIVE Sensitive     IMIPENEM 1 SENSITIVE Sensitive     NITROFURANTOIN 64 INTERMEDIATE Intermediate     TRIMETH/SULFA >=320 RESISTANT Resistant     AMPICILLIN/SULBACTAM 4 SENSITIVE Sensitive     PIP/TAZO <=4 SENSITIVE Sensitive ug/mL    * >=100,000 COLONIES/mL KLEBSIELLA PNEUMONIAE    Labs: CBC: Recent Labs  Lab 03/08/23 1216 03/09/23 0453 03/10/23 0510 03/11/23 0432  WBC 4.2 1.5* 1.8* 2.3*  NEUTROABS  --   --  0.7* 0.9*  HGB 12.0 11.4* 11.6* 11.4*  HCT 38.8 36.0 36.8 36.5  MCV 101.6* 98.4 99.7 97.9   PLT 142* 136* 134* 143*   Basic Metabolic Panel: Recent Labs  Lab 03/08/23 1216 03/09/23 0453  NA 139 139  K 4.3 4.2  CL 105 108  CO2 23 23  GLUCOSE 100* 73  BUN 34* 34*  CREATININE 1.25* 1.14*  CALCIUM 8.5* 8.2*   Liver Function Tests: Recent Labs  Lab 03/08/23 1216  AST 25  ALT 13  ALKPHOS 50  BILITOT 0.9  PROT 6.8  ALBUMIN 3.1*   CBG: No results for input(s): "GLUCAP" in the last 168 hours.  Discharge time spent: greater than 30 minutes.  Signed: Lurene Shadow,  MD [facility] 03/11/2023

## 2023-03-11 NOTE — TOC Transition Note (Signed)
 Transition of Care [facility]) - Discharge Note   Patient Details  Name: [name] [name] MRN: [id number] Date of Birth: July 12, 1926  Transition of [facility]) CM/SW Contact:  [name], RN Phone Number: 03/11/2023, 4:28 PM   Clinical Narrative: To discharge home with [facility] services.      Final next level of care: Home w Hospice Care Barriers to Discharge: Barriers Resolved   Patient Goals and CMS Choice            Discharge Placement                  Name of family member notified: Son Patient and family notified of of transfer: 03/10/23  Discharge Plan and Services Additional resources added to the After Visit Summary for                  DME Arranged: N/A DME Agency: NA       HH Arranged: NA HH Agency: NA        Social Drivers of Health (SDOH) Interventions SDOH Screenings   Food Insecurity: Patient Unable To Answer (03/08/2023)  Housing: Patient Unable To Answer (03/08/2023)  Transportation Needs: Patient Unable To Answer (03/08/2023)  Utilities: Patient Unable To Answer (03/08/2023)  Social Connections: Patient Unable To Answer (03/08/2023)  Tobacco Use: Low Risk  (01/15/2022)     Readmission Risk Interventions     No data to display

## 2023-03-11 NOTE — Plan of Care (Signed)
  Problem: Health Behavior/Discharge Planning: Goal: Ability to manage health-related needs will improve Outcome: Progressing   Problem: Clinical Measurements: Goal: Cardiovascular complication will be avoided Outcome: Progressing   Problem: Elimination: Goal: Will not experience complications related to bowel motility Outcome: Progressing   Problem: Safety: Goal: Ability to remain free from injury will improve Outcome: Progressing

## 2023-03-20 ENCOUNTER — Emergency Department

## 2023-03-20 ENCOUNTER — Other Ambulatory Visit: Payer: Self-pay

## 2023-03-20 ENCOUNTER — Emergency Department
Admission: EM | Admit: 2023-03-20 | Discharge: 2023-03-20 | Disposition: A | Discharge status: Hospice | Attending: Emergency Medicine | Admitting: Emergency Medicine

## 2023-03-20 DIAGNOSIS — I130 Hypertensive heart and chronic kidney disease with heart failure and stage 1 through stage 4 chronic kidney disease, or unspecified chronic kidney disease: Secondary | ICD-10-CM | POA: Diagnosis not present

## 2023-03-20 DIAGNOSIS — M8580 Other specified disorders of bone density and structure, unspecified site: Secondary | ICD-10-CM | POA: Diagnosis not present

## 2023-03-20 DIAGNOSIS — R051 Acute cough: Secondary | ICD-10-CM | POA: Insufficient documentation

## 2023-03-20 DIAGNOSIS — I959 Hypotension, unspecified: Secondary | ICD-10-CM | POA: Diagnosis not present

## 2023-03-20 DIAGNOSIS — R052 Subacute cough: Secondary | ICD-10-CM | POA: Diagnosis not present

## 2023-03-20 DIAGNOSIS — M7989 Other specified soft tissue disorders: Secondary | ICD-10-CM | POA: Diagnosis not present

## 2023-03-20 DIAGNOSIS — M160 Bilateral primary osteoarthritis of hip: Secondary | ICD-10-CM | POA: Diagnosis not present

## 2023-03-20 DIAGNOSIS — R0989 Other specified symptoms and signs involving the circulatory and respiratory systems: Secondary | ICD-10-CM | POA: Diagnosis not present

## 2023-03-20 DIAGNOSIS — N189 Chronic kidney disease, unspecified: Secondary | ICD-10-CM | POA: Insufficient documentation

## 2023-03-20 DIAGNOSIS — R0602 Shortness of breath: Secondary | ICD-10-CM | POA: Diagnosis not present

## 2023-03-20 DIAGNOSIS — I509 Heart failure, unspecified: Secondary | ICD-10-CM | POA: Insufficient documentation

## 2023-03-20 DIAGNOSIS — Z7401 Bed confinement status: Secondary | ICD-10-CM | POA: Diagnosis not present

## 2023-03-20 DIAGNOSIS — R0902 Hypoxemia: Secondary | ICD-10-CM | POA: Diagnosis not present

## 2023-03-20 DIAGNOSIS — R059 Cough, unspecified: Secondary | ICD-10-CM | POA: Diagnosis not present

## 2023-03-20 DIAGNOSIS — M25552 Pain in left hip: Secondary | ICD-10-CM | POA: Diagnosis not present

## 2023-03-20 DIAGNOSIS — I517 Cardiomegaly: Secondary | ICD-10-CM | POA: Diagnosis not present

## 2023-03-20 DIAGNOSIS — J811 Chronic pulmonary edema: Secondary | ICD-10-CM | POA: Diagnosis not present

## 2023-03-20 DIAGNOSIS — I10 Essential (primary) hypertension: Secondary | ICD-10-CM | POA: Diagnosis not present

## 2023-03-20 LAB — COMPREHENSIVE METABOLIC PANEL
ALT: 7 U/L (ref 0–44)
AST: 14 U/L — ABNORMAL LOW (ref 15–41)
Albumin: 3 g/dL — ABNORMAL LOW (ref 3.5–5.0)
Alkaline Phosphatase: 52 U/L (ref 38–126)
Anion gap: 8 (ref 5–15)
BUN: 28 mg/dL — ABNORMAL HIGH (ref 8–23)
CO2: 26 mmol/L (ref 22–32)
Calcium: 8.7 mg/dL — ABNORMAL LOW (ref 8.9–10.3)
Chloride: 105 mmol/L (ref 98–111)
Creatinine, Ser: 1.05 mg/dL — ABNORMAL HIGH (ref 0.44–1.00)
GFR, Estimated: 49 mL/min — ABNORMAL LOW (ref >60)
Glucose, Bld: 94 mg/dL (ref 70–99)
Potassium: 4.2 mmol/L (ref 3.5–5.1)
Sodium: 139 mmol/L (ref 135–145)
Total Bilirubin: 0.9 mg/dL (ref 0.0–1.2)
Total Protein: 6.9 g/dL (ref 6.5–8.1)

## 2023-03-20 LAB — RESP PANEL BY RT-PCR (RSV, FLU A&B, COVID)  RVPGX2
Influenza A by PCR: POSITIVE — AB
Influenza B by PCR: NEGATIVE
Resp Syncytial Virus by PCR: NEGATIVE
SARS Coronavirus 2 by RT PCR: NEGATIVE

## 2023-03-20 LAB — CBC
HCT: 33.5 % — ABNORMAL LOW (ref 36.0–46.0)
Hemoglobin: 10.5 g/dL — ABNORMAL LOW (ref 12.0–15.0)
MCH: 31 pg (ref 26.0–34.0)
MCHC: 31.3 g/dL (ref 30.0–36.0)
MCV: 98.8 fL (ref 80.0–100.0)
Platelets: 402 10*3/uL — ABNORMAL HIGH (ref 150–400)
RBC: 3.39 MIL/uL — ABNORMAL LOW (ref 3.87–5.11)
RDW: 12.5 % (ref 11.5–15.5)
WBC: 5.4 10*3/uL (ref 4.0–10.5)
nRBC: 0 % (ref 0.0–0.2)

## 2023-03-20 NOTE — Discharge Instructions (Addendum)
 Please follow-up with your primary care doctor within the next 2 days for recheck/reevaluation.  Return to the emergency department for any worsening cough, any chest pain or shortness of breath, or any other symptom concerning to yourself.

## 2023-03-20 NOTE — ED Triage Notes (Signed)
 Pt here via [facility] from home with a cough. Pt was released on 3/1 with influenza. Pt also c/o a lack of appetite and left hip pain by the [facility] nurse but pt denies. Pt left knee is swollen but is pt's baseline. Pt stable in triage.

## 2023-03-20 NOTE — ED Provider Notes (Signed)
-----------------------------------------   6:25 PM on 03/20/2023 ----------------------------------------- Patient CBC and chemistry showed no significant finding, patient's influenza test is still positive from her initial positive last month.  X-rays are negative for acute abnormality.  Satting 99%.  Given the patient's reassuring workup I believe the patient is safe for discharge home with PCP follow-up.  Denies any urinary symptoms however patient was unable to provide [place] a urine sample.   Minna Antis, MD 03/20/23 [phone]

## 2023-03-20 NOTE — Progress Notes (Signed)
 [facility] Liaison note   Patient is currently followed by [facility].  [facility] will follow through discharge disposition.    Please call with any Hospice related questions or concerns.    [facility] Liaison [phone]

## 2023-03-20 NOTE — ED Notes (Signed)
Mask placed on patient

## 2023-03-20 NOTE — ED Provider Notes (Signed)
 [facility] Provider Note   Event Date/Time   First MD Initiated Contact with Patient 03/20/23 1343     (approximate) History  Cough  HPI [name] [name] is a 89 y.o. female with a past medical history of hypertension, CHF, CKD, and dementia who presents via EMS from home with complaints of persistent cough after being diagnosed with influenza on 3/1.  Patient also endorses lack of appetite and left hip pain however this was reported from home health nurse that patient denies.  Patient also states her left knee is swollen at baseline.  Patient denies any other complaints at this time ROS: Patient currently denies any vision changes, tinnitus, difficulty speaking, facial droop, sore throat, chest pain, shortness of breath, abdominal pain, nausea/vomiting/diarrhea, dysuria, or weakness/numbness/paresthesias in any extremity   Physical Exam  Triage Vital Signs: ED Triage Vitals  Encounter Vitals Group     BP 03/20/23 1057 (!) 155/62     Systolic BP Percentile --      Diastolic BP Percentile --      Pulse Rate 03/20/23 1055 74     Resp 03/20/23 1055 17     Temp 03/20/23 1055 97.9 F (36.6 C)     Temp Source 03/20/23 1055 Oral     SpO2 03/20/23 1055 99 %     Weight 03/20/23 1057 156 lb 15.5 oz (71.2 kg)     Height 03/20/23 1057 5\' 5"  (1.651 m)     Head Circumference --      Peak Flow --      Pain Score 03/20/23 1057 0     Pain Loc --      Pain Education --      Exclude from Growth Chart --    Most recent vital signs: Vitals:   03/20/23 1055 03/20/23 1057  BP:  (!) 155/62  Pulse: 74   Resp: 17   Temp: 97.9 F (36.6 C)   SpO2: 99%    General: Awake, oriented x1 CV:  Good peripheral perfusion.  Resp:  Normal effort.  Rhonchi over bilateral upper lung fields that clears with coughing Abd:  No distention.  Other:  Elderly overweight Caucasian female resting comfortably in no acute distress ED Results / Procedures / Treatments  Labs (all labs ordered  are listed, but only abnormal results are displayed) Labs Reviewed  RESP PANEL BY RT-PCR (RSV, FLU A&B, COVID)  [id number] - Abnormal; Notable for the following components:      Result Value   Influenza A by PCR POSITIVE (*)    All other components within normal limits  CBC - Abnormal; Notable for the following components:   RBC 3.39 (*)    Hemoglobin 10.5 (*)    HCT 33.5 (*)    Platelets 402 (*)    All other components within normal limits  COMPREHENSIVE METABOLIC PANEL - Abnormal; Notable for the following components:   BUN 28 (*)    Creatinine, Ser 1.05 (*)    Calcium 8.7 (*)    Albumin 3.0 (*)    AST 14 (*)    GFR, Estimated 49 (*)    All other components within normal limits  URINALYSIS, ROUTINE W REFLEX MICROSCOPIC   EKG ED ECG REPORT I, Merwyn Katos, the attending physician, personally viewed and interpreted this ECG. Date: 03/20/2023 EKG Time: 1105 Rate: 60 Rhythm: normal sinus rhythm QRS Axis: normal Intervals: normal ST/T Wave abnormalities: normal Narrative Interpretation: no evidence of acute ischemia RADIOLOGY ED MD interpretation: Pending -  Agree with radiology assessment Official radiology report(s): No results found. PROCEDURES: Critical Care performed: No Procedures MEDICATIONS ORDERED IN ED: Medications - No data to display IMPRESSION / MDM / ASSESSMENT AND PLAN / ED COURSE  I reviewed the triage vital signs and the nursing notes.                             The patient is on the cardiac monitor to evaluate for evidence of arrhythmia and/or significant heart rate changes. Patient's presentation is most consistent with acute presentation with potential threat to life or bodily function. Presentation most consistent with Viral Syndrome.  Patient has tested positive for influenza A. Based on vitals and exam they are nontoxic and stable for discharge.  Given History and Exam I have a lower suspicion for: Emergent CardioPulmonary causes [such as Acute  Asthma or COPD Exacerbation, acute Heart Failure or exacerbation, PE, PTX, atypical ACS, PNA]. Emergent Otolaryngeal causes [such as PTA, RPA, Ludwigs, Epiglottitis, EBV].  Regarding Emergent Travel or Immunosuppressive related infectious: I have a low suspicion for acute HIV.  Will provide strict return precautions and instructions on self-isolation/quarantine and anticipatory guidance. Patient pending x-ray results of chest and left hip Care of this patient will be signed out to the oncoming physician at the end of my shift.  All pertinent patient information conveyed and all questions answered.  All further care and disposition decisions will be made by the oncoming physician.   FINAL CLINICAL IMPRESSION(S) / ED DIAGNOSES   Final diagnoses:  Subacute cough   Rx / DC Orders   ED Discharge Orders     None      Note:  This document was prepared using Dragon voice recognition software and may include unintentional dictation errors.   Merwyn Katos, MD 03/20/23 [phone]

## 2023-03-20 NOTE — ED Notes (Signed)
 [facility] CALLED FOR  TRANSPORT HOME

## 2023-03-20 NOTE — ED Notes (Signed)
 [facility] arrived to transport pt at this time. Pts paperwork including DNR/Most form and discharge given to transport to provide to grandson who awaits at the home for pt arrival. [name], pts grandson called by Clinical research associate to update on transport arrival and discharge instructions/education.

## 2023-03-20 NOTE — ED Notes (Signed)
 Patient's grandson would like a phone call before pt is brought home to ensure that he is present.

## 2023-07-15 ENCOUNTER — Inpatient Hospital Stay
Admission: EM | Admit: 2023-07-15 | Discharge: 2023-07-17 | DRG: 593 | Disposition: A | Discharge status: Hospice | Attending: Obstetrics and Gynecology | Admitting: Obstetrics and Gynecology

## 2023-07-15 ENCOUNTER — Emergency Department

## 2023-07-15 ENCOUNTER — Other Ambulatory Visit: Payer: Self-pay

## 2023-07-15 DIAGNOSIS — D631 Anemia in chronic kidney disease: Secondary | ICD-10-CM | POA: Diagnosis present

## 2023-07-15 DIAGNOSIS — R410 Disorientation, unspecified: Secondary | ICD-10-CM | POA: Diagnosis not present

## 2023-07-15 DIAGNOSIS — Z90710 Acquired absence of both cervix and uterus: Secondary | ICD-10-CM

## 2023-07-15 DIAGNOSIS — E860 Dehydration: Secondary | ICD-10-CM | POA: Diagnosis present

## 2023-07-15 DIAGNOSIS — Z515 Encounter for palliative care: Secondary | ICD-10-CM

## 2023-07-15 DIAGNOSIS — F02C0 Dementia in other diseases classified elsewhere, severe, without behavioral disturbance, psychotic disturbance, mood disturbance, and anxiety: Secondary | ICD-10-CM

## 2023-07-15 DIAGNOSIS — I82402 Acute embolism and thrombosis of unspecified deep veins of left lower extremity: Secondary | ICD-10-CM | POA: Diagnosis present

## 2023-07-15 DIAGNOSIS — E440 Moderate protein-calorie malnutrition: Secondary | ICD-10-CM | POA: Diagnosis present

## 2023-07-15 DIAGNOSIS — L89622 Pressure ulcer of left heel, stage 2: Secondary | ICD-10-CM | POA: Diagnosis present

## 2023-07-15 DIAGNOSIS — R531 Weakness: Secondary | ICD-10-CM

## 2023-07-15 DIAGNOSIS — Z6824 Body mass index (BMI) 24.0-24.9, adult: Secondary | ICD-10-CM

## 2023-07-15 DIAGNOSIS — I130 Hypertensive heart and chronic kidney disease with heart failure and stage 1 through stage 4 chronic kidney disease, or unspecified chronic kidney disease: Secondary | ICD-10-CM | POA: Diagnosis present

## 2023-07-15 DIAGNOSIS — I5032 Chronic diastolic (congestive) heart failure: Secondary | ICD-10-CM | POA: Diagnosis not present

## 2023-07-15 DIAGNOSIS — F0390 Unspecified dementia without behavioral disturbance: Secondary | ICD-10-CM | POA: Diagnosis present

## 2023-07-15 DIAGNOSIS — M25551 Pain in right hip: Principal | ICD-10-CM | POA: Diagnosis present

## 2023-07-15 DIAGNOSIS — N1832 Chronic kidney disease, stage 3b: Secondary | ICD-10-CM | POA: Diagnosis present

## 2023-07-15 DIAGNOSIS — L8990 Pressure ulcer of unspecified site, unspecified stage: Secondary | ICD-10-CM | POA: Diagnosis not present

## 2023-07-15 DIAGNOSIS — Z86718 Personal history of other venous thrombosis and embolism: Secondary | ICD-10-CM

## 2023-07-15 DIAGNOSIS — I82409 Acute embolism and thrombosis of unspecified deep veins of unspecified lower extremity: Secondary | ICD-10-CM | POA: Diagnosis present

## 2023-07-15 DIAGNOSIS — L89154 Pressure ulcer of sacral region, stage 4: Principal | ICD-10-CM | POA: Diagnosis present

## 2023-07-15 DIAGNOSIS — R451 Restlessness and agitation: Secondary | ICD-10-CM | POA: Diagnosis not present

## 2023-07-15 DIAGNOSIS — R4181 Age-related cognitive decline: Secondary | ICD-10-CM | POA: Diagnosis present

## 2023-07-15 DIAGNOSIS — Z7401 Bed confinement status: Secondary | ICD-10-CM

## 2023-07-15 DIAGNOSIS — R64 Cachexia: Secondary | ICD-10-CM | POA: Diagnosis present

## 2023-07-15 DIAGNOSIS — Z8249 Family history of ischemic heart disease and other diseases of the circulatory system: Secondary | ICD-10-CM

## 2023-07-15 DIAGNOSIS — Z66 Do not resuscitate: Secondary | ICD-10-CM | POA: Diagnosis present

## 2023-07-15 DIAGNOSIS — Z833 Family history of diabetes mellitus: Secondary | ICD-10-CM

## 2023-07-15 DIAGNOSIS — M79604 Pain in right leg: Secondary | ICD-10-CM | POA: Diagnosis not present

## 2023-07-15 DIAGNOSIS — L89313 Pressure ulcer of right buttock, stage 3: Secondary | ICD-10-CM | POA: Diagnosis present

## 2023-07-15 DIAGNOSIS — I10 Essential (primary) hypertension: Secondary | ICD-10-CM

## 2023-07-15 DIAGNOSIS — N390 Urinary tract infection, site not specified: Secondary | ICD-10-CM | POA: Diagnosis present

## 2023-07-15 LAB — COMPREHENSIVE METABOLIC PANEL WITH GFR
ALT: 11 U/L (ref 0–44)
AST: 24 U/L (ref 15–41)
Albumin: 2.7 g/dL — ABNORMAL LOW (ref 3.5–5.0)
Alkaline Phosphatase: 72 U/L (ref 38–126)
Anion gap: 10 (ref 5–15)
BUN: 48 mg/dL — ABNORMAL HIGH (ref 8–23)
CO2: 26 mmol/L (ref 22–32)
Calcium: 8.8 mg/dL — ABNORMAL LOW (ref 8.9–10.3)
Chloride: 106 mmol/L (ref 98–111)
Creatinine, Ser: 1.14 mg/dL — ABNORMAL HIGH (ref 0.44–1.00)
GFR, Estimated: 44 mL/min — ABNORMAL LOW (ref >60)
Glucose, Bld: 126 mg/dL — ABNORMAL HIGH (ref 70–99)
Potassium: 4.6 mmol/L (ref 3.5–5.1)
Sodium: 142 mmol/L (ref 135–145)
Total Bilirubin: 0.9 mg/dL (ref 0.0–1.2)
Total Protein: 7.6 g/dL (ref 6.5–8.1)

## 2023-07-15 LAB — URINALYSIS, COMPLETE (UACMP) WITH MICROSCOPIC
Bilirubin Urine: NEGATIVE
Glucose, UA: NEGATIVE mg/dL
Hgb urine dipstick: NEGATIVE
Ketones, ur: NEGATIVE mg/dL
Nitrite: NEGATIVE
Protein, ur: 30 mg/dL — AB
Specific Gravity, Urine: 1.018 (ref 1.005–1.030)
WBC, UA: >50 WBC/hpf (ref 0–5)
pH: 5 (ref 5.0–8.0)

## 2023-07-15 LAB — CBC
HCT: 34.8 % — ABNORMAL LOW (ref 36.0–46.0)
Hemoglobin: 10.5 g/dL — ABNORMAL LOW (ref 12.0–15.0)
MCH: 30 pg (ref 26.0–34.0)
MCHC: 30.2 g/dL (ref 30.0–36.0)
MCV: 99.4 fL (ref 80.0–100.0)
Platelets: 492 K/uL — ABNORMAL HIGH (ref 150–400)
RBC: 3.5 MIL/uL — ABNORMAL LOW (ref 3.87–5.11)
RDW: 13.8 % (ref 11.5–15.5)
WBC: 11.3 K/uL — ABNORMAL HIGH (ref 4.0–10.5)
nRBC: 0 % (ref 0.0–0.2)

## 2023-07-15 MED ORDER — SODIUM CHLORIDE 0.9 % IV SOLN
2.0000 g | INTRAVENOUS | Status: DC
  Administered 2023-07-15: 2 g via INTRAVENOUS
  Filled 2023-07-15: qty 12.5

## 2023-07-15 MED ORDER — VANCOMYCIN HCL 1500 MG/300ML IV SOLN
1500.0000 mg | Freq: Once | INTRAVENOUS | Status: DC
  Filled 2023-07-15: qty 300

## 2023-07-15 MED ORDER — FENTANYL CITRATE PF 50 MCG/ML IJ SOSY
50.0000 ug | PREFILLED_SYRINGE | Freq: Once | INTRAMUSCULAR | Status: AC
  Administered 2023-07-15: 50 ug via INTRAVENOUS
  Filled 2023-07-15: qty 1

## 2023-07-15 MED ORDER — SODIUM CHLORIDE 0.9 % IV SOLN
1.0000 g | Freq: Once | INTRAVENOUS | Status: AC
  Administered 2023-07-15: 1 g via INTRAVENOUS
  Filled 2023-07-15: qty 10

## 2023-07-15 MED ORDER — VANCOMYCIN HCL 750 MG/150ML IV SOLN
750.0000 mg | Freq: Once | INTRAVENOUS | Status: AC
  Administered 2023-07-15: 750 mg via INTRAVENOUS
  Filled 2023-07-15: qty 150

## 2023-07-15 MED ORDER — SODIUM CHLORIDE 0.9 % IV BOLUS
1000.0000 mL | Freq: Once | INTRAVENOUS | Status: AC
  Administered 2023-07-15: 1000 mL via INTRAVENOUS

## 2023-07-15 MED ORDER — SODIUM CHLORIDE 0.9 % IV SOLN
INTRAVENOUS | Status: DC
  Administered 2023-07-15: 40 mL/h via INTRAVENOUS

## 2023-07-15 MED ORDER — MORPHINE SULFATE (CONCENTRATE) 10 MG /0.5 ML PO SOLN
10.0000 mg | ORAL | Status: DC | PRN
  Administered 2023-07-16: 10 mg via ORAL
  Administered 2023-07-16: 20 mg via ORAL
  Filled 2023-07-15 (×2): qty 1

## 2023-07-15 MED ORDER — VANCOMYCIN VARIABLE DOSE PER UNSTABLE RENAL FUNCTION (PHARMACIST DOSING): Status: DC

## 2023-07-15 NOTE — ED Notes (Addendum)
 This RN and Psychologist, prison and probation services changed pt. While changing pt this RN noticed a stage 4 ulcer on pt's sacrum. Wound is foul smelling, tunneling, and some necrotic tissue noted. Attending Danford was present on assessment. Attending Danford visualized pt's ulcer and took a photo for pt's records. This RN placed a Mepolex skin barrier foam on ulcer. Pt did not tolerate wound cleaning and peri care well. This RN also visualized a stage 2 wound on pt's left hip which has been covered with a foam dressing, stage 2 wound on pt's left heel which has been covered with gauze and taped.

## 2023-07-15 NOTE — Hospital Course (Addendum)
 89 y.o. F with end stage dementia, lives at home, on hospice, stage IV sacral decubitus ulcer, hx VTE, hx lymphedema, HTN, dCHF, CKD IIIb baseline 1.1-1.2 who presented with right hip pain, decreased PO intake.  For the last week or so, patient has had decreased oral intake, seemed to grandson to be dehydrated, getting depleted.  Then today, the nurse aide called him at work several times that she was complaining of right hip pain, so they called EMS.  In the ER, she was combative but vital signs normal.  WBC 11.3K.  UA showed many WBCs and she had foul-smelling drainage from her right buttock/sacral wound.  Hospitalist service was asked to evaluate for decline in status.

## 2023-07-15 NOTE — ED Notes (Signed)
 Pt's hospice nurse Branden called this RN for an update on pt's care. This RN told Stuart that pt is being admitted to the hospital for multiple wound infections.

## 2023-07-15 NOTE — ED Notes (Signed)
 Delay in blood work and Iv meds due to pt combativeness and unable to find a vein.

## 2023-07-15 NOTE — Progress Notes (Signed)
 Pharmacy Antibiotic Note  [name] [name] is a 89 y.o. female admitted on 07/15/2023 with wound infection.  Pharmacy has been consulted for vancomycin  and cefepime  dosing.  Plan: Start cefepime  2 g IV every 24 hours Give vancomycin  load of 1500 mg IV x1 Dose per levels given unstable renal function Monitor renal function, clinical status, culture data, and LOT  Height: 5' 5 (165.1 cm) Weight: 67.1 kg (148 lb) IBW/kg (Calculated) : 57  Temp (24hrs), Avg:97.8 F (36.6 C), Min:97.8 F (36.6 C), Max:97.8 F (36.6 C)  Recent Labs  Lab 07/15/23 1757  WBC 11.3*  CREATININE 1.14*    Estimated Creatinine Clearance: 26.6 mL/min (A) (by C-G formula based on SCr of 1.14 mg/dL (H)).    No Known Allergies  Antimicrobials this admission: cefepime  7/5 >>  vancomycin  7/8 >>   Dose adjustments this admission:  N/A  Microbiology results: 7/5 UCx: pending   Thank you for involving pharmacy in this patient's care.   Damien Napoleon, PharmD Clinical Pharmacist 07/15/2023 9:43 PM

## 2023-07-15 NOTE — Assessment & Plan Note (Signed)
 Continue home meds
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
## 2023-07-15 NOTE — Assessment & Plan Note (Signed)
 Cr stable relative to baseline 1.1-1.2

## 2023-07-15 NOTE — Assessment & Plan Note (Signed)
 Appears euvolemic to dehydrated

## 2023-07-15 NOTE — ED Triage Notes (Signed)
 Pt in via [facility] coming from home. Hospice pt. Pt been declining over the past month. Not eating. Called EMS today for declining condition. Pt also c/o right leg pain. Pt is verbally combative.  BS-100

## 2023-07-15 NOTE — Assessment & Plan Note (Signed)
 See above

## 2023-07-15 NOTE — H&P (Signed)
 History and Physical    Patient: [name] [name] FMW:[id number] DOB: 1926/10/25 DOA: 07/15/2023 DOS: the patient was seen and examined on 07/15/2023 PCP: Epifanio Alm SQUIBB, MD  Patient coming from: Home  Chief Complaint:  Chief Complaint  Patient presents with   right leg pain       HPI:  89 y.o. F with end stage dementia, lives at home, on hospice, stage IV sacral decubitus ulcer, hx VTE, hx lymphedema, HTN, dCHF, CKD IIIb baseline 1.1-1.2 who presented with right hip pain, decreased PO intake.  For the last week or so, patient has had decreased oral intake, seemed to grandson to be dehydrated, getting depleted.  Then today, the nurse aide called him at work several times that she was complaining of right hip pain, so they called EMS.  In the ER, she was combative but vital signs normal.  WBC 11.3K.  UA showed many WBCs and she had foul-smelling drainage from her right buttock/sacral wound.  Hospitalist service was asked to evaluate for decline in status.         Review of Systems  Unable to perform ROS: Dementia   Per family, reporting right hip pain, increased agitation.  Decreased oral intake.     Past Medical History:  Diagnosis Date   Cellulitis 10/28/2016   bilateral lower extremities.    Past Surgical History:  Procedure Laterality Date   ABDOMINAL HYSTERECTOMY     GANGLION CYST EXCISION     LOWER EXTREMITY VENOGRAPHY Left 12/14/2020   Procedure: LOWER EXTREMITY VENOGRAPHY;  Surgeon: Marea Selinda RAMAN, MD;  Location: [facility] INVASIVE CV LAB;  Service: Cardiovascular;  Laterality: Left;   Social History:  reports that she has never smoked. She has never used smokeless tobacco. She reports that she does not drink alcohol  and does not use drugs.  No Known Allergies  Family History  Problem Relation Age of Onset   Diabetes Mellitus II Mother    Heart disease Father     Prior to Admission medications   Not on File    Physical Exam: Vitals:   07/15/23 1625  07/15/23 1630 07/15/23 1727  BP:  (!) 117/50   Pulse:  70   Resp:  (!) 9   Temp:   97.8 F (36.6 C)  TempSrc:   Rectal  SpO2:   100%  Weight: 67.1 kg    Height: 5' 5 (1.651 m)     Frail elderly female, lying in fetal position, opens eyes, looks towards me, but does not make eye contact or make any intelligible responses Oropharynx appears tacky dry, does not open mouth for exam, sclera, conjunctiva appear normal RRR, no murmurs, no peripheral edema Respiratory rate seems normal, lung sounds diminished but no rales or wheezes appreciated, exam limited by inability to cooperate Abdomen soft, no tenderness to palpation, no guarding There is a large stage IV sacral wound with purulent foul-smelling drainage Neurologically she is somewhat responsive to questions, but does not follow commands, is agitated, makes nonsensical verbalizations       Data Reviewed: Basic metabolic panel shows stable renal function, low albumin, normal sodium, potassium CBC shows mild anemia Urinalysis with many bacteria, some leukocytes Hip radiograph unremarkable     Assessment and Plan: * Agitation End stage dementia on Hospice Right hip pain Suspected infected decubitus ulcer Possible UTI Given advanced dementia, hard to assess symptoms.  Grandson relays she was sent for right hip pain primarily.  Hip x-ray here normal, but I suspect her pain complaints are  from the tunneling, draining right buttock wound, which is likely very painful.  Overall, management of this deep decubitus ulcer could not be curative given her advanced dementia.  I favor continued Hospice care, and more aggressive symptom management. Likely her prognosis is getting worse.  Antibiotics for this infected ulcer will likely not change the overall trajectory, but I will continue them until family discussions with the medical team, Palliative Care and Hospice can occur and a determination can be made by hospice whether she is a  candidate for residential Hospice.  I did share with grandson this may be a terminal event with prognosis of weeks, I was not sure.  - Oral morphine  concentrate - PRN Haldol  for agitation if morphine  unsuccessful - Consult Palliative care - Follow urine culture - Contact [facility]   Pressure injury of skin Stage IV, right buttock/sacrum, present on admission See above  HTN (hypertension) - Continue home meds  Malnutrition of moderate degree - Consult dietitian   Chronic diastolic CHF (congestive heart failure) (HCC) Appears euvolemic to dehydrated   Stage 3b chronic kidney disease (HCC) Cr stable relative to baseline 1.1-1.2         Advance Care Planning: DNR  Consults: Palliative Care   Family Communication: Grandson by phone  Severity of Illness: The appropriate patient status for this patient is OBSERVATION. Observation status is judged to be reasonable and necessary in order to provide the required intensity of service to ensure the patient's safety. The patient's presenting symptoms, physical exam findings, and initial radiographic and laboratory data in the context of their medical condition is felt to place them at decreased risk for further clinical deterioration. Furthermore, it is anticipated that the patient will be medically stable for discharge from the hospital within 2 midnights of admission.   Author: Lonni SHAUNNA Dalton, MD 07/15/2023 9:04 PM  For on call review [email or web address].

## 2023-07-15 NOTE — Assessment & Plan Note (Addendum)
 Consult dietitian

## 2023-07-15 NOTE — ED Notes (Signed)
 Assisted RN with cleaning pt, provided warm blankets, and call bell is by bedside at this time.

## 2023-07-15 NOTE — Assessment & Plan Note (Signed)
 End stage dementia on Hospice Right hip pain Suspected infected decubitus ulcer Given advanced dementia, hard to assess symptoms.  Grandson relays she was sent for right hip pain primarily.  Hip x-ray here normal, but I suspect her pain complaints are from the tunneling, draining right buttock wound, which is likely very painful.  Overall, management of this deep decubitus ulcer could not be curative given her advanced dementia.  I favor continued Hospice care, and more aggressive symptom management. Likely her prognosis is getting worse.  Antibiotics for this infected ulcer will likely not change the overall trajectory, but I will continue them until family discussions with the medical team, Palliative Care and Hospice can occur and a determination can be made by hospice whether she is a candidate for residential Hospice.  I did share with grandson this may be a terminal event with prognosis of weeks, I was not sure. - Oral morphine  concentrate - PRN Haldol  for agitation if morphine  unsuccessful - Consult Palliative care

## 2023-07-15 NOTE — ED Provider Notes (Signed)
 [facility] Provider Note    Event Date/Time   First MD Initiated Contact with Patient 07/15/23 1618     (approximate)  History   Chief Complaint: right leg pain  HPI  [name] [name] is a 89 y.o. female who is currently on hospice care presents to the emergency department for generalized weakness/decline.  According to EMS patient is on hospice at home, lives with family.  Patient has a history of significant dementia.  Family reports patient is complaining of right hip pain and due to generalized weakness and generalized decline over the past 1 week they asked the patient to be transported to the emergency department for further evaluation.  Patient is unable to provide any history given dementia.  Does appear to have some discomfort to the right hip when moving the right lower extremity.  EMS states family reported no falls or trauma.  Physical Exam   Triage Vital Signs: ED Triage Vitals [07/15/23 1625]  Encounter Vitals Group     BP      Girls Systolic BP Percentile      Girls Diastolic BP Percentile      Boys Systolic BP Percentile      Boys Diastolic BP Percentile      Pulse      Resp      Temp      Temp src      SpO2      Weight 148 lb (67.1 kg)     Height 5' 5 (1.651 m)     Head Circumference      Peak Flow      Pain Score      Pain Loc      Pain Education      Exclude from Growth Chart     Most recent vital signs: There were no vitals filed for this visit.  General: Awake, no distress.  CV:  Good peripheral perfusion.  Regular rate and rhythm  Resp:  Normal effort.  Equal breath sounds bilaterally.  Abd:  No distention.  Soft, nontender.  Other:  Patient has mild tenderness to palpation of the right hip, appears to have moderate discomfort with range of motion of the right hip.  Neurovascularly intact distally.  Patient does have a small ulceration to the left heel with a padded dressing on it.  Patient also has a mild sacral  ulceration to the right buttocks with a dressing on it.   ED Results / Procedures / Treatments   RADIOLOGY  I have reviewed and interpreted the hip x-ray images I do not appreciate any obvious fracture of the right hip or pelvis. Radiologist read the x-ray is negative   MEDICATIONS ORDERED IN ED: Medications  sodium chloride  0.9 % bolus 1,000 mL (has no administration in time range)  fentaNYL  (SUBLIMAZE ) injection 50 mcg (has no administration in time range)     IMPRESSION / MDM / ASSESSMENT AND PLAN / ED COURSE  I reviewed the triage vital signs and the nursing notes.  Patient's presentation is most consistent with acute presentation with potential threat to life or bodily function.  Patient presents to the emergency department for generalized decline/weakness as well as right hip pain over the past 1 week.  EMS states patient is on home hospice lives with family, and has a significant dementia history.  Patient is unable to provide any additional history.  Does appear to have some discomfort with range of motion of the right hip we will obtain  x-ray imaging to further evaluate.  Will treat pain and IV hydrate.  Will check labs and continue to closely monitor while awaiting family arrival.  Patient's x-ray is negative.  Patient CBC overall reassuring, chemistry shows no significant finding.  Patient has received a liter of IV fluids.  Patient's urinalysis however shows a urinary tract infection.  A urine culture has been sent as a precaution.  Patient has been given IV Rocephin .  As the patient is a hospice patient I spoke to family, however they are not comfortable with the patient going home as she has been increasingly uncooperative and agitated.  Given the patient's altered mental state with urinary tract infection we will admit to the hospital service for further workup and treatment.  FINAL CLINICAL IMPRESSION(S) / ED DIAGNOSES   Weakness Right hip pain Urinary tract  infection  Note:  This document was prepared using Dragon voice recognition software and may include unintentional dictation errors.   Dorothyann Drivers, MD 07/15/23 [phone]

## 2023-07-16 ENCOUNTER — Other Ambulatory Visit: Payer: Self-pay

## 2023-07-16 DIAGNOSIS — I5032 Chronic diastolic (congestive) heart failure: Secondary | ICD-10-CM | POA: Diagnosis present

## 2023-07-16 DIAGNOSIS — R451 Restlessness and agitation: Secondary | ICD-10-CM | POA: Diagnosis present

## 2023-07-16 DIAGNOSIS — F0390 Unspecified dementia without behavioral disturbance: Secondary | ICD-10-CM | POA: Diagnosis present

## 2023-07-16 DIAGNOSIS — L89313 Pressure ulcer of right buttock, stage 3: Secondary | ICD-10-CM | POA: Diagnosis present

## 2023-07-16 DIAGNOSIS — I130 Hypertensive heart and chronic kidney disease with heart failure and stage 1 through stage 4 chronic kidney disease, or unspecified chronic kidney disease: Secondary | ICD-10-CM | POA: Diagnosis present

## 2023-07-16 DIAGNOSIS — L89154 Pressure ulcer of sacral region, stage 4: Secondary | ICD-10-CM | POA: Diagnosis present

## 2023-07-16 DIAGNOSIS — D631 Anemia in chronic kidney disease: Secondary | ICD-10-CM | POA: Diagnosis present

## 2023-07-16 DIAGNOSIS — Z90710 Acquired absence of both cervix and uterus: Secondary | ICD-10-CM | POA: Diagnosis not present

## 2023-07-16 DIAGNOSIS — Z8249 Family history of ischemic heart disease and other diseases of the circulatory system: Secondary | ICD-10-CM | POA: Diagnosis not present

## 2023-07-16 DIAGNOSIS — R4181 Age-related cognitive decline: Secondary | ICD-10-CM | POA: Diagnosis present

## 2023-07-16 DIAGNOSIS — R64 Cachexia: Secondary | ICD-10-CM | POA: Diagnosis present

## 2023-07-16 DIAGNOSIS — Z6824 Body mass index (BMI) 24.0-24.9, adult: Secondary | ICD-10-CM | POA: Diagnosis not present

## 2023-07-16 DIAGNOSIS — Z66 Do not resuscitate: Secondary | ICD-10-CM | POA: Diagnosis present

## 2023-07-16 DIAGNOSIS — Z833 Family history of diabetes mellitus: Secondary | ICD-10-CM | POA: Diagnosis not present

## 2023-07-16 DIAGNOSIS — N1832 Chronic kidney disease, stage 3b: Secondary | ICD-10-CM | POA: Diagnosis present

## 2023-07-16 DIAGNOSIS — Z7401 Bed confinement status: Secondary | ICD-10-CM | POA: Diagnosis not present

## 2023-07-16 DIAGNOSIS — L89622 Pressure ulcer of left heel, stage 2: Secondary | ICD-10-CM | POA: Diagnosis present

## 2023-07-16 DIAGNOSIS — M25551 Pain in right hip: Secondary | ICD-10-CM | POA: Diagnosis present

## 2023-07-16 DIAGNOSIS — Z86718 Personal history of other venous thrombosis and embolism: Secondary | ICD-10-CM | POA: Diagnosis not present

## 2023-07-16 DIAGNOSIS — Z515 Encounter for palliative care: Secondary | ICD-10-CM | POA: Diagnosis not present

## 2023-07-16 DIAGNOSIS — E860 Dehydration: Secondary | ICD-10-CM | POA: Diagnosis present

## 2023-07-16 DIAGNOSIS — N390 Urinary tract infection, site not specified: Secondary | ICD-10-CM | POA: Diagnosis present

## 2023-07-16 DIAGNOSIS — E440 Moderate protein-calorie malnutrition: Secondary | ICD-10-CM | POA: Diagnosis present

## 2023-07-16 MED ORDER — GLYCOPYRROLATE 0.2 MG/ML IJ SOLN: 0.2000 mg | INTRAMUSCULAR | Status: DC | PRN

## 2023-07-16 MED ORDER — ONDANSETRON HCL 4 MG/2ML IJ SOLN: 4.0000 mg | Freq: Four times a day (QID) | INTRAMUSCULAR | Status: DC | PRN

## 2023-07-16 MED ORDER — ONDANSETRON HCL 4 MG PO TABS: 4.0000 mg | ORAL_TABLET | Freq: Four times a day (QID) | ORAL | Status: DC | PRN

## 2023-07-16 MED ORDER — DIPHENHYDRAMINE HCL 50 MG/ML IJ SOLN: 25.0000 mg | INTRAMUSCULAR | Status: DC | PRN

## 2023-07-16 MED ORDER — GLYCOPYRROLATE 1 MG PO TABS: 1.0000 mg | ORAL_TABLET | ORAL | Status: DC | PRN

## 2023-07-16 MED ORDER — MORPHINE BOLUS VIA INFUSION: 5.0000 mg | INTRAVENOUS | Status: DC | PRN

## 2023-07-16 MED ORDER — ACETAMINOPHEN 650 MG RE SUPP: 650.0000 mg | Freq: Four times a day (QID) | RECTAL | Status: DC | PRN

## 2023-07-16 MED ORDER — ACETAMINOPHEN 325 MG PO TABS: 650.0000 mg | ORAL_TABLET | Freq: Four times a day (QID) | ORAL | Status: DC | PRN

## 2023-07-16 MED ORDER — MEDIHONEY WOUND/BURN DRESSING EX PSTE
1.0000 | PASTE | Freq: Every day | CUTANEOUS | Status: DC
  Filled 2023-07-16: qty 44

## 2023-07-16 MED ORDER — DAKINS (1/4 STRENGTH) 0.125 % EX SOLN
Freq: Every day | CUTANEOUS | Status: DC
  Filled 2023-07-16: qty 1

## 2023-07-16 MED ORDER — METRONIDAZOLE 500 MG/100ML IV SOLN
500.0000 mg | Freq: Two times a day (BID) | INTRAVENOUS | Status: DC
  Filled 2023-07-16: qty 100

## 2023-07-16 MED ORDER — ENOXAPARIN SODIUM 40 MG/0.4ML IJ SOSY: 40.0000 mg | PREFILLED_SYRINGE | INTRAMUSCULAR | Status: DC

## 2023-07-16 MED ORDER — SODIUM CHLORIDE 0.9 % IV SOLN: INTRAVENOUS | Status: AC

## 2023-07-16 MED ORDER — MIDAZOLAM HCL 2 MG/2ML IJ SOLN: 2.0000 mg | INTRAMUSCULAR | Status: DC | PRN

## 2023-07-16 MED ORDER — POLYVINYL ALCOHOL 1.4 % OP SOLN
1.0000 [drp] | Freq: Four times a day (QID) | OPHTHALMIC | Status: DC | PRN
Start: 2023-07-16 — End: 2023-07-18

## 2023-07-16 MED ORDER — ZINC OXIDE 40 % EX OINT
TOPICAL_OINTMENT | Freq: Two times a day (BID) | CUTANEOUS | Status: DC
  Filled 2023-07-16: qty 113

## 2023-07-16 MED ORDER — ONDANSETRON 4 MG PO TBDP: 4.0000 mg | ORAL_TABLET | Freq: Four times a day (QID) | ORAL | Status: DC | PRN

## 2023-07-16 MED ORDER — HALOPERIDOL LACTATE 5 MG/ML IJ SOLN: 2.5000 mg | INTRAMUSCULAR | Status: DC | PRN

## 2023-07-16 MED ORDER — ACETAMINOPHEN 325 MG PO TABS
650.0000 mg | ORAL_TABLET | Freq: Four times a day (QID) | ORAL | Status: DC | PRN
Start: 2023-07-16 — End: 2023-07-18

## 2023-07-16 MED ORDER — MORPHINE 100MG IN NS 100ML (1MG/ML) PREMIX INFUSION
0.0000 mg/h | INTRAVENOUS | Status: DC
  Administered 2023-07-16: 2 mg/h via INTRAVENOUS
  Filled 2023-07-16: qty 100

## 2023-07-16 NOTE — ED Notes (Signed)
 Advised nurse that patient has ready bed

## 2023-07-16 NOTE — ED Notes (Signed)
 This RN took over care from [employer]. Pt calm and sleeping in bed. Pt was found to have mitts on due to pt being combative with day shift nurses and pulling at monitoring cords and IV lines per [employer].

## 2023-07-16 NOTE — ED Notes (Signed)
 Pt repositioned on to right side

## 2023-07-16 NOTE — ED Notes (Signed)
Fall bundle in place

## 2023-07-16 NOTE — Progress Notes (Addendum)
 [facility] Liaison Note  This patient is currently followed by [facility]. Met with patient and family at the bedside to explain services at the [facility].  Family would like some time to think about their options.  Grandson said that he may want to take patient home at discharge and resume hospice services, but is considering possible placement at the hospice home, as well.   Please call with any hospice related questions or concerns.  [facility] Liaison [phone]

## 2023-07-16 NOTE — Progress Notes (Signed)
 PROGRESS NOTE    [name] [name]  FMW:[id number] DOB: 1926/05/30 DOA: 07/15/2023 PCP: Epifanio Alm SQUIBB, MD     Brief Narrative:   89 y.o. F with end stage dementia, lives at home, on hospice, stage IV sacral decubitus ulcer, hx VTE, hx lymphedema, HTN, dCHF, CKD IIIb baseline 1.1-1.2 who presented with right hip pain, decreased PO intake.   For the last week or so, patient has had decreased oral intake, seemed to grandson to be dehydrated, getting depleted.  Then today, the nurse aide called him at work several times that she was complaining of right hip pain, so they called EMS.   In the ER, she was combative but vital signs normal.  WBC 11.3K.  UA showed many WBCs and she had foul-smelling drainage from her right buttock/sacral wound.  Hospitalist service was asked to evaluate for decline in status.      Assessment & Plan:   Principal Problem:   Agitation Active Problems:   Pressure injury of skin   Deep vein thrombosis (DVT) of left lower extremity (HCC)   DVT (deep venous thrombosis) (HCC)   Stage 3b chronic kidney disease (HCC)   Chronic diastolic CHF (congestive heart failure) (HCC)   HTN (hypertension), malignant   Dementia (HCC)   Malnutrition of moderate degree   HTN (hypertension)   History advanced dementia, bedbound, severe malnutrition, hfpef, htn, ckd 3b, prior DVT, on hospice at home, presenting with pain, found to have severe unstageable infected decubitus ulcer. Lengthy bedside discussion with family today including grandson who is primary Management consultant, son Christopher also present on the phone. I explained that the patient is suffering, that she cannot recover from this condition, and that efforts to treat the condition will only prolong that suffering. I advised transition to full comfort care with morphine  drip. Family voiced agreement. Will implement those comfort care orders, hospice is aware, family is interested in residential hospice, anticipate in-hospital  death or transfer to residential hospice.    Level of care: Palliative Care Status is: Observation     Consultants:  none  Procedures: none  Antimicrobials:  S/p vanc/cefepime     Subjective: Moaning, complains of back/hip pain  Objective: Vitals:   07/15/23 2151 07/16/23 0006 07/16/23 0420 07/16/23 0821  BP:  129/64 (!) 107/55 115/62  Pulse:  79 70 (!) 49  Resp:  12 16 18   Temp: 97.6 F (36.4 C) (!) 96.5 F (35.8 C) (!) 97.4 F (36.3 C) 98 F (36.7 C)  TempSrc: Oral Oral Oral   SpO2:  100% 97% 92%  Weight:      Height:        Intake/Output Summary (Last 24 hours) at 07/16/2023 0855 Last data filed at 07/15/2023 2113 Gross per 24 hour  Intake 1000 ml  Output --  Net 1000 ml   Filed Weights   07/15/23 1625  Weight: 67.1 kg    Examination:  General exam: cachectic, moaning, appears ill Respiratory system: normal wob Cardiovascular system: rrr Gastrointestinal system: Abdomen is nondistended, soft   Central nervous system: moving all 4 Extremities: warm Skin: decubitus ulcers on heels, sacrum Psychiatry: confused    Data Reviewed: I have personally reviewed following labs and imaging studies  CBC: Recent Labs  Lab 07/15/23 1757  WBC 11.3*  HGB 10.5*  HCT 34.8*  MCV 99.4  PLT 492*   Basic Metabolic Panel: Recent Labs  Lab 07/15/23 1757  NA 142  K 4.6  CL 106  CO2 26  GLUCOSE 126*  BUN 48*  CREATININE 1.14*  CALCIUM 8.8*   GFR: Estimated Creatinine Clearance: 26.6 mL/min (A) (by C-G formula based on SCr of 1.14 mg/dL (H)). Liver Function Tests: Recent Labs  Lab 07/15/23 1757  AST 24  ALT 11  ALKPHOS 72  BILITOT 0.9  PROT 7.6  ALBUMIN 2.7*   No results for input(s): LIPASE, AMYLASE in the last 168 hours. No results for input(s): AMMONIA in the last 168 hours. Coagulation Profile: No results for input(s): INR, PROTIME in the last 168 hours. Cardiac Enzymes: No results for input(s): CKTOTAL, CKMB,  CKMBINDEX, TROPONINI in the last 168 hours. BNP (last 3 results) No results for input(s): PROBNP in the last 8760 hours. HbA1C: No results for input(s): HGBA1C in the last 72 hours. CBG: No results for input(s): GLUCAP in the last 168 hours. Lipid Profile: No results for input(s): CHOL, HDL, LDLCALC, TRIG, CHOLHDL, LDLDIRECT in the last 72 hours. Thyroid  Function Tests: No results for input(s): TSH, T4TOTAL, FREET4, T3FREE, THYROIDAB in the last 72 hours. Anemia Panel: No results for input(s): VITAMINB12, FOLATE, FERRITIN, TIBC, IRON , RETICCTPCT in the last 72 hours. Urine analysis:    Component Value Date/Time   COLORURINE AMBER (A) 07/15/2023 1703   APPEARANCEUR CLOUDY (A) 07/15/2023 1703   LABSPEC 1.018 07/15/2023 1703   PHURINE 5.0 07/15/2023 1703   GLUCOSEU NEGATIVE 07/15/2023 1703   HGBUR NEGATIVE 07/15/2023 1703   BILIRUBINUR NEGATIVE 07/15/2023 1703   KETONESUR NEGATIVE 07/15/2023 1703   PROTEINUR 30 (A) 07/15/2023 1703   NITRITE NEGATIVE 07/15/2023 1703   LEUKOCYTESUR LARGE (A) 07/15/2023 1703   Sepsis Labs: @LABRCNTIP (procalcitonin:4,lacticidven:4)  )No results found for this or any previous visit (from the past 240 hours).       Radiology Studies: DG HIP UNILAT WITH PELVIS 2-3 VIEWS RIGHT Result Date: 07/15/2023 CLINICAL DATA:  Right hip pain EXAM: DG HIP (WITH OR WITHOUT PELVIS) 2-3V RIGHT COMPARISON:  None Available. FINDINGS: Diffuse osteopenia. Hip joints and SI joints symmetric. No acute bony abnormality. Specifically, no fracture, subluxation, or dislocation. IMPRESSION: No acute bony abnormality. Electronically Signed   By: Franky Crease M.D.   On: 07/15/2023 17:32        Scheduled Meds: Continuous Infusions:  sodium chloride      morphine        LOS: 0 days     Devaughn KATHEE Ban, MD [facility]   If 7PM-7AM, please contact night-coverage [email or web address] Password [email or web address] 07/16/2023, 8:55 AM

## 2023-07-16 NOTE — ED Notes (Signed)
 Pt was hand fed apple sauce by this RN and given water 

## 2023-07-16 NOTE — ED Notes (Signed)
Pt repositioned onto back

## 2023-07-16 NOTE — Consult Note (Signed)
 WOC Nurse Consult Note: Reason for Consult: sacral wound  Wound type: Unstageable Pressure Injury Sacrum/buttocks  Pressure Injury POA: Yes Measurement: see nursing flowsheet  Wound bed: appears largely brown black necrotic tissue to buttock and sacral wound bed, adjacent Stage 3 with tan necrotic tissue on L buttock; also noted to have what appears to be a Stage 2 PI L trochanter (hip)  Drainage (amount, consistency, odor)  foul smelling per MD note  Periwound: significant moisture associated skin damage  Dressing procedure/placement/frequency:  Cleanse buttocks/sacrum with Vashe wound cleanser Soila [phone]) do not rinse and allow to air dry.  Using a Q tip applicator insert Dakin's moistened gauze into sacral wound bed and onto buttocks wounds.  Cover with dry gauze and ABD pad.   Cleanse L trochanter (hip) Stage 2 with NS and apply Xeroform gauze Soila [phone]) to wound bed daily and secure with silicone foam.  Lift foam daily to replace Xeroform, change foam q3 days and prn soiling.    Will do Dakin's x 3 days.  After 3 days of Dakin's will switch to Medihoney to wound for continued debridement.    Patient should be placed on a low air loss mattress for pressure redistribution and moisture management.    POC discussed with bedside nurse. WOc team will not follow. Re-consult if further needs arise.   Thank you,    Powell Bar MSN, RN-BC, [employer]

## 2023-07-16 NOTE — ED Notes (Addendum)
 Pt pulled up into bed with assistance with [facility] tech. Pt has been repositioned to left side and a pillow placed between pt's knees

## 2023-07-16 NOTE — Progress Notes (Signed)
   07/16/23 1400  Spiritual Encounters  Type of Visit Initial  Care provided to: Pt not available  Referral source Physician  Reason for visit End-of-life  OnCall Visit Yes   Chaplain responded to [place] consult to provide support to patient. Patient sleeping at time of visit. Chaplain/team will follow-up.

## 2023-07-16 NOTE — Progress Notes (Signed)
 [facility] Liaison Note   [name] is a current [facility] hospice patient with a terminal diagnosis of cerebrovascular disease. Patient has had gradual decline over the last week and began complaining of increased leg/hip pain on Saturday 7.6 and the decision was made to transfer her to the ED for further evaluation. She has been admitted for pain and agitation management. Per Dr. Deane [name] with [facility] this is a related hospital admission.   Discussion was had about possible transfer to the hospice home for further management but at this time family would like some time to think about it. She has been started on a Morphine  drip to promote comfort. She has received IVAB and IV fluids and she is inpatient appropriate because of this.   Vital Signs- 98/49/18   115/62    spO2 92% on room air Intake/Output- 1567/not yet recorded Abnormal labs- BUN 48, Creatinine 1.14, Ca+ 8.8, Albumin 2.7, GFR 44, WBC 11.3, RBC 3.50, Hgb 10.5, Hct 34.8, Platelets 492 Diagnostics- None new  IV/PRN Meds- NS 1L bolus IV x1, Vancomycin  750mg  IV once, Rocephin  1 gram IV once, Cefepime  2g IV once, Morphine  2mg /H IV continuous  Problem List as per PN Dr. Devaughn [name] 7.6.25 Agitation Active Problems:   Pressure injury of skin   Deep vein thrombosis (DVT) of left lower extremity (HCC)   DVT (deep venous thrombosis) (HCC)   Stage 3b chronic kidney disease (HCC)   Chronic diastolic CHF (congestive heart failure) (HCC)   HTN (hypertension), malignant   Dementia (HCC)   Malnutrition of moderate degree   HTN (hypertension)     History advanced dementia, bedbound, severe malnutrition, hfpef, htn, ckd 3b, prior DVT, on hospice at home, presenting with pain, found to have severe unstageable infected decubitus ulcer. Lengthy bedside discussion with family today including grandson who is primary Management consultant, [name] also present on the phone. I explained that the  patient is suffering, that she cannot recover from this condition, and that efforts to treat the condition will only prolong that suffering. I advised transition to full comfort care with morphine  drip  Discharge Planning- Ongoing, home vs IPU if appropriate Family Contact- with grandson in person IDT- Updated Goals of care - DNR [name] Seip, RN, [facility] Liaison [phone]

## 2023-07-16 NOTE — Plan of Care (Signed)
  Problem: Health Behavior/Discharge Planning: Goal: Ability to manage health-related needs will improve Outcome: Progressing   Problem: Coping: Goal: Level of anxiety will decrease Outcome: Progressing   Problem: Elimination: Goal: Will not experience complications related to bowel motility Outcome: Progressing Goal: Will not experience complications related to urinary retention Outcome: Progressing   Problem: Pain Managment: Goal: General experience of comfort will improve and/or be controlled Outcome: Progressing   Problem: Clinical Measurements: Goal: Will remain free from infection Outcome: Not Progressing   Problem: Activity: Goal: Risk for activity intolerance will decrease Outcome: Not Progressing   Problem: Nutrition: Goal: Adequate nutrition will be maintained Outcome: Not Progressing   Problem: Safety: Goal: Ability to remain free from injury will improve Outcome: Not Progressing   Problem: Skin Integrity: Goal: Risk for impaired skin integrity will decrease Outcome: Not Progressing   Problem: Education: Goal: Knowledge of General Education information will improve Description: Including pain rating scale, medication(s)/side effects and non-pharmacologic comfort measures Outcome: Not Applicable   Problem: Clinical Measurements: Goal: Ability to maintain clinical measurements within normal limits will improve Outcome: Not Applicable Goal: Diagnostic test results will improve Outcome: Not Applicable Goal: Respiratory complications will improve Outcome: Not Applicable Goal: Cardiovascular complication will be avoided Outcome: Not Applicable   Problem: Health Behavior/Discharge Planning: Goal: Ability to manage health-related needs will improve Outcome: Progressing   Problem: Coping: Goal: Level of anxiety will decrease Outcome: Progressing   Problem: Elimination: Goal: Will not experience complications related to bowel motility Outcome:  Progressing Goal: Will not experience complications related to urinary retention Outcome: Progressing   Problem: Pain Managment: Goal: General experience of comfort will improve and/or be controlled Outcome: Progressing   Problem: Clinical Measurements: Goal: Will remain free from infection Outcome: Not Progressing   Problem: Activity: Goal: Risk for activity intolerance will decrease Outcome: Not Progressing   Problem: Nutrition: Goal: Adequate nutrition will be maintained Outcome: Not Progressing   Problem: Safety: Goal: Ability to remain free from injury will improve Outcome: Not Progressing   Problem: Skin Integrity: Goal: Risk for impaired skin integrity will decrease Outcome: Not Progressing   Problem: Education: Goal: Knowledge of General Education information will improve Description: Including pain rating scale, medication(s)/side effects and non-pharmacologic comfort measures Outcome: Not Applicable   Problem: Clinical Measurements: Goal: Ability to maintain clinical measurements within normal limits will improve Outcome: Not Applicable Goal: Diagnostic test results will improve Outcome: Not Applicable Goal: Respiratory complications will improve Outcome: Not Applicable Goal: Cardiovascular complication will be avoided Outcome: Not Applicable

## 2023-07-17 DIAGNOSIS — L89154 Pressure ulcer of sacral region, stage 4: Secondary | ICD-10-CM

## 2023-07-17 MED ORDER — CHLORHEXIDINE GLUCONATE CLOTH 2 % EX PADS
6.0000 | MEDICATED_PAD | Freq: Every day | CUTANEOUS | Status: DC
  Administered 2023-07-17: 6 via TOPICAL

## 2023-07-17 NOTE — Progress Notes (Signed)
 Nutrition Brief Note  Chart reviewed. Pt now transitioning to comfort care.  No further nutrition interventions planned at this time.  Please re-consult as needed.   Levada Schilling, RD, LDN, CDCES Registered Dietitian III Certified Diabetes Care and Education Specialist If unable to reach this RD, please use "RD Inpatient" group chat on secure chat between hours of 8am-4 pm daily

## 2023-07-17 NOTE — TOC Initial Note (Signed)
 Transition of Care [facility]) - Initial/Assessment Note    Patient Details  Name: [name] [name] MRN: [id number] Date of Birth: 08/31/1926  [facility] of [facility]) CM/SW Contact:    Elouise LULLA Capri, RN 07/17/2023, 11:07 AM  Clinical Narrative:                  CM call to patient's grandson, Warren regarding confirmation of current hospice services for patient. Per patient's grandson, [facility] is active in home  and [facility] is active in the home. CM alert to [facility] regarding patient's admission.  Expected Discharge Plan: Home w Hospice Care Barriers to Discharge: Continued Medical Work up   Patient Goals and CMS Choice    Return home with hospice/[facility] active in home   Expected Discharge Plan and Services    Return home with hospice                DME Agency: Other - Comment ([facility] and [facility])   Prior Living Arrangements/Services    Home with hospice services.           Need for Family Participation in Patient Care: Yes (Comment) Care giver support system in place?: Yes (comment)   Criminal Activity/Legal Involvement Pertinent to Current Situation/Hospitalization: No - Comment as needed  Activities of Daily Living   ADL Screening (condition at time of admission) Independently performs ADLs?: No Does the patient have a NEW difficulty with bathing/dressing/toileting/self-feeding that is expected to last >3 days?: Yes (Initiates electronic notice to provider for possible OT consult) Does the patient have a NEW difficulty with getting in/out of bed, walking, or climbing stairs that is expected to last >3 days?: Yes (Initiates electronic notice to provider for possible PT consult) Does the patient have a NEW difficulty with communication that is expected to last >3 days?: Yes (Initiates electronic notice to provider for possible SLP consult) Is the patient deaf or have difficulty  hearing?: No Does the patient have difficulty seeing, even when wearing glasses/contacts?: No Does the patient have difficulty concentrating, remembering, or making decisions?: Yes  Permission Sought/Granted      Share Information with NAME: Nasim Garofano     Permission granted to share info w Relationship: Grandson/Son     Emotional Assessment    Unable to assess    Admission diagnosis:  Agitation [R45.1] Weakness [R53.1] Right hip pain [M25.551] Urinary tract infection without hematuria, site unspecified [N39.0] Patient Active Problem List   Diagnosis Date Noted   Agitation 07/15/2023   Influenza A 03/08/2023   Wound infection 02/04/2022   Acute encephalopathy 01/14/2022   Hypothermia 01/14/2022   Acute hypoxic respiratory failure (HCC) 01/14/2022   Leg ulcer (HCC) 11/02/2021   DVT (deep venous thrombosis) (HCC) 12/12/2020   HTN (hypertension) 12/12/2020   Blister of left foot 12/12/2020   Deep vein thrombosis (DVT) of left lower extremity (HCC) 12/12/2020   Leukocytosis    Malnutrition of moderate degree 11/09/2020   Pressure injury of skin 11/08/2020   Dementia (HCC) 11/07/2020   Lactic acidosis 11/07/2020   Superficial bruising of arm, left, initial encounter    Chronic diastolic CHF (congestive heart failure) (HCC) 04/06/2020   HTN (hypertension), malignant    Lymphedema    Fall    Stage 3b chronic kidney disease (HCC)    B12 deficiency    Anemia due to vitamin B12 deficiency    Acute metabolic encephalopathy    General weakness  Confusion    Acute UTI 01/07/2020   E. coli UTI 01/07/2020   Chronic venous stasis dermatitis of both lower extremities 01/07/2020   Obese 03/26/2019   Cellulitis 03/06/2017   Left leg cellulitis 11/25/2016   AKI (acute kidney injury) (HCC) 11/05/2016   Decubitus ulcer of ischium, stage 2, left (HCC) 11/05/2016   Troponin I above reference range 11/04/2016   PCP:  Epifanio Alm SQUIBB, MD Pharmacy:   [facility] [address]), [place] - [address] [place]) [place] [address] Phone: [phone] Fax: [phone]     Social Drivers of Health (SDOH) Social History: SDOH Screenings   Food Insecurity: No Food Insecurity (07/16/2023)  Housing: Low Risk  (07/16/2023)  Transportation Needs: No Transportation Needs (07/16/2023)  Utilities: Not At Risk (07/16/2023)  Social Connections: Patient Unable To Answer (07/16/2023)  Tobacco Use: Low Risk  (07/15/2023)   SDOH Interventions:     Readmission Risk Interventions     No data to display

## 2023-07-17 NOTE — Discharge Summary (Signed)
 [name] [name] FMW:[id number] DOB: 1926/06/02 DOA: 07/15/2023  PCP: Epifanio Alm SQUIBB, MD  Admit date: 07/15/2023 Discharge date: 07/17/2023  Time spent: 35 minutes     Discharge Diagnoses:  Principal Problem:   Decubitus ulcer of sacral region, stage 4 (HCC) Active Problems:   Pressure injury of skin   Deep vein thrombosis (DVT) of left lower extremity (HCC)   DVT (deep venous thrombosis) (HCC)   Stage 3b chronic kidney disease (HCC)   Chronic diastolic CHF (congestive heart failure) (HCC)   HTN (hypertension), malignant   Dementia (HCC)   Malnutrition of moderate degree   HTN (hypertension)   Agitation   Discharge Condition: guarded  Diet recommendation: ad lib  Filed Weights   07/15/23 1625  Weight: 67.1 kg    History of present illness:  From admission h and p 89 y.o. F with end stage dementia, lives at home, on hospice, stage IV sacral decubitus ulcer, hx VTE, hx lymphedema, HTN, dCHF, CKD IIIb baseline 1.1-1.2 who presented with right hip pain, decreased PO intake.   For the last week or so, patient has had decreased oral intake, seemed to grandson to be dehydrated, getting depleted.  Then today, the nurse aide called him at work several times that she was complaining of right hip pain, so they called EMS.   In the ER, she was combative but vital signs normal.  WBC 11.3K.  UA showed many WBCs and she had foul-smelling drainage from her right buttock/sacral wound.  Hospitalist service was asked to evaluate for decline in status.       Hospital Course:   History advanced dementia, bedbound, severe malnutrition, hfpef, htn, ckd 3b, prior DVT, on hospice at home, presenting with pain, found to have severe unstageable infected decubitus ulcer. Lengthy bedside discussion with family on hospital day 1 including grandson who is primary Management consultant, son Christopher also present on the phone. Provider explained that the patient is suffering, that she cannot recover from this  condition, and that efforts to treat the condition will only prolong that suffering. Advised transition to full comfort care with morphine  drip. Family voiced agreement. Patient now peaceful on morphine  gtt. Hospice consulted, transferred to residential hospice at discharge.    Procedures: none   Consultations: none  Discharge Exam: Vitals:   07/16/23 1941 07/17/23 0747  BP: (!) 71/57 (!) 73/45  Pulse: 88 79  Resp:  15  Temp: 98.2 F (36.8 C) 98.9 F (37.2 C)  SpO2:  90%    General: NAD, chronically ill appearing Cardiovascular: rrr Respiratory: normal wob Sacrum:  Discharge Instructions   Discharge Instructions     Diet general   Complete by: As directed    Discharge wound care:   Complete by: As directed    Comfort-based wound care   Increase activity slowly   Complete by: As directed       Allergies as of 07/17/2023   No Known Allergies      Medication List    You have not been prescribed any medications.            Discharge Care Instructions  (From admission, onward)           Start     Ordered   07/17/23 0000  Discharge wound care:       Comments: Comfort-based wound care   07/17/23 1300           No Known Allergies    The results of significant diagnostics from this hospitalization (  including imaging, microbiology, ancillary and laboratory) are listed below for reference.    Significant Diagnostic Studies: DG HIP UNILAT WITH PELVIS 2-3 VIEWS RIGHT Result Date: 07/15/2023 CLINICAL DATA:  Right hip pain EXAM: DG HIP (WITH OR WITHOUT PELVIS) 2-3V RIGHT COMPARISON:  None Available. FINDINGS: Diffuse osteopenia. Hip joints and SI joints symmetric. No acute bony abnormality. Specifically, no fracture, subluxation, or dislocation. IMPRESSION: No acute bony abnormality. Electronically Signed   By: Franky Crease M.D.   On: 07/15/2023 17:32    Microbiology: Recent Results (from the past 240 hours)  Urine Culture     Status: Abnormal  (Preliminary result)   Collection Time: 07/15/23  5:03 PM   Specimen: Urine, Catheterized  Result Value Ref Range Status   Specimen Description   Final    URINE, CATHETERIZED Performed at [facility], [address], [place], [place] [address]    Special Requests   Final    NONE Performed at [facility], [address] [place], [place] [address]    Culture (A)  Final    >=100,000 COLONIES/mL KLEBSIELLA PNEUMONIAE 50,000 COLONIES/mL ENTEROCOCCUS FAECALIS CULTURE REINCUBATED FOR BETTER GROWTH Performed at [facility], [address], [place], [place] [address]    Report Status PENDING  Incomplete     Labs: Basic Metabolic Panel: Recent Labs  Lab 07/15/23 1757  NA 142  K 4.6  CL 106  CO2 26  GLUCOSE 126*  BUN 48*  CREATININE 1.14*  CALCIUM 8.8*   Liver Function Tests: Recent Labs  Lab 07/15/23 1757  AST 24  ALT 11  ALKPHOS 72  BILITOT 0.9  PROT 7.6  ALBUMIN 2.7*   No results for input(s): LIPASE, AMYLASE in the last 168 hours. No results for input(s): AMMONIA in the last 168 hours. CBC: Recent Labs  Lab 07/15/23 1757  WBC 11.3*  HGB 10.5*  HCT 34.8*  MCV 99.4  PLT 492*   Cardiac Enzymes: No results for input(s): CKTOTAL, CKMB, CKMBINDEX, TROPONINI in the last 168 hours. BNP: BNP (last 3 results) No results for input(s): BNP in the last 8760 hours.  ProBNP (last 3 results) No results for input(s): PROBNP in the last 8760 hours.  CBG: No results for input(s): GLUCAP in the last 168 hours.     Signed:  Devaughn KATHEE Ban MD.  [facility] 07/17/2023, 1:01 PM

## 2023-07-17 NOTE — TOC Transition Note (Signed)
 Transition of Care [facility]) - Discharge Note   Patient Details  Name: [name] [name] MRN: [id number] Date of Birth: [birth year]  [facility]) CM/SW Contact:  Elouise LULLA Capri, RN 07/17/2023, 1:23 PM   Clinical Narrative:    Discharge orders for hospice medical facility, [facility], [address] [place], [place], [place]. Alert received from Jack, [facility], request for BLS transport at 1700 and RN report number is [phone]. CM call to [employer], phone: [phone] to schedule BLS transport. CM spoke to [place]. Per [place], BLS transport scheduled for 1700 as requested.   Final next level of care: Hospice Medical Facility Barriers to Discharge: No Barriers Identified   Patient Goals and CMS Choice    Hospice House/[facility]   Discharge Placement    Hospice House/[facility]        Discharge Plan and Services Additional resources added to the After Visit Summary for            DME Agency: Other - Comment ([facility] and [facility])     [facility] Agency:  [facility], [place], [address], [place], [place] [address])   Social Drivers of Health (SDOH) Interventions SDOH Screenings   Food Insecurity: No Food Insecurity (07/16/2023)  Housing: Low Risk  (07/16/2023)  Transportation Needs: No Transportation Needs (07/16/2023)  Utilities: Not At Risk (07/16/2023)  Social Connections: Patient Unable To Answer (07/16/2023)  Tobacco Use: Low Risk  (07/15/2023)     Readmission Risk Interventions    07/17/2023   11:11 AM  Readmission Risk Prevention Plan  Transportation Screening --  PCP or Specialist Appt within 3-5 Days --

## 2023-07-17 NOTE — Progress Notes (Signed)
 [facility] Liaison Note  Grandson agreed to have patient transferred to the Hospice Home today.  Hospice team notified.  [facility] to transport around 5pm.   Please call with any hospice related questions or concerns.    [facility] Liaison [phone]

## 2023-07-18 LAB — URINE CULTURE: Culture: >=100000 — AB

## 2023-08-11 DEATH — deceased
